# Patient Record
Sex: Female | Born: 1950 | Race: White | Hispanic: No | Marital: Married | Smoking: Former smoker
Health system: Southern US, Community
[De-identification: ages and names within clinical notes are randomized; demographics above are authoritative.]

## PROBLEM LIST (undated history)

## (undated) DIAGNOSIS — R600 Localized edema: Secondary | ICD-10-CM

## (undated) DIAGNOSIS — M1990 Unspecified osteoarthritis, unspecified site: Secondary | ICD-10-CM

## (undated) DIAGNOSIS — M1711 Unilateral primary osteoarthritis, right knee: Secondary | ICD-10-CM

## (undated) DIAGNOSIS — R3915 Urgency of urination: Secondary | ICD-10-CM

## (undated) DIAGNOSIS — R609 Edema, unspecified: Secondary | ICD-10-CM

## (undated) DIAGNOSIS — E785 Hyperlipidemia, unspecified: Secondary | ICD-10-CM

## (undated) DIAGNOSIS — E559 Vitamin D deficiency, unspecified: Secondary | ICD-10-CM

## (undated) DIAGNOSIS — K5790 Diverticulosis of intestine, part unspecified, without perforation or abscess without bleeding: Secondary | ICD-10-CM

## (undated) DIAGNOSIS — E669 Obesity, unspecified: Secondary | ICD-10-CM

## (undated) DIAGNOSIS — F419 Anxiety disorder, unspecified: Secondary | ICD-10-CM

## (undated) DIAGNOSIS — J439 Emphysema, unspecified: Secondary | ICD-10-CM

## (undated) DIAGNOSIS — K219 Gastro-esophageal reflux disease without esophagitis: Secondary | ICD-10-CM

## (undated) DIAGNOSIS — L409 Psoriasis, unspecified: Secondary | ICD-10-CM

## (undated) DIAGNOSIS — F329 Major depressive disorder, single episode, unspecified: Secondary | ICD-10-CM

## (undated) DIAGNOSIS — Z872 Personal history of diseases of the skin and subcutaneous tissue: Secondary | ICD-10-CM

## (undated) DIAGNOSIS — Z8489 Family history of other specified conditions: Secondary | ICD-10-CM

## (undated) DIAGNOSIS — I10 Essential (primary) hypertension: Secondary | ICD-10-CM

## (undated) DIAGNOSIS — F32A Depression, unspecified: Secondary | ICD-10-CM

## (undated) DIAGNOSIS — G479 Sleep disorder, unspecified: Secondary | ICD-10-CM

## (undated) DIAGNOSIS — R011 Cardiac murmur, unspecified: Secondary | ICD-10-CM

## (undated) HISTORY — PX: TONSILLECTOMY AND ADENOIDECTOMY: SUR1326

## (undated) HISTORY — DX: Emphysema, unspecified: J43.9

## (undated) HISTORY — DX: Edema, unspecified: R60.9

## (undated) HISTORY — DX: Gastro-esophageal reflux disease without esophagitis: K21.9

## (undated) HISTORY — DX: Cardiac murmur, unspecified: R01.1

## (undated) HISTORY — PX: TUBAL LIGATION: SHX77

## (undated) HISTORY — DX: Essential (primary) hypertension: I10

## (undated) HISTORY — PX: ABDOMINAL HYSTERECTOMY: SHX81

## (undated) HISTORY — DX: Personal history of diseases of the skin and subcutaneous tissue: Z87.2

## (undated) HISTORY — DX: Localized edema: R60.0

## (undated) HISTORY — PX: APPENDECTOMY: SHX54

## (undated) HISTORY — DX: Obesity, unspecified: E66.9

## (undated) HISTORY — DX: Vitamin D deficiency, unspecified: E55.9

## (undated) HISTORY — DX: Hyperlipidemia, unspecified: E78.5

## (undated) HISTORY — PX: KNEE ARTHROSCOPY: SUR90

## (undated) HISTORY — PX: JOINT REPLACEMENT: SHX530

## (undated) HISTORY — DX: Diverticulosis of intestine, part unspecified, without perforation or abscess without bleeding: K57.90

---
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
## 1997-09-11 ENCOUNTER — Ambulatory Visit (HOSPITAL_COMMUNITY): Admission: RE | Admit: 1997-09-11 | Discharge: 1997-09-11 | Payer: Self-pay | Admitting: *Deleted

## 1998-09-15 ENCOUNTER — Ambulatory Visit (HOSPITAL_COMMUNITY): Admission: RE | Admit: 1998-09-15 | Discharge: 1998-09-15 | Payer: Self-pay | Admitting: *Deleted

## 1999-02-16 ENCOUNTER — Other Ambulatory Visit: Admission: RE | Admit: 1999-02-16 | Discharge: 1999-02-16 | Payer: Self-pay | Admitting: *Deleted

## 1999-09-21 ENCOUNTER — Ambulatory Visit (HOSPITAL_COMMUNITY): Admission: RE | Admit: 1999-09-21 | Discharge: 1999-09-21 | Payer: Self-pay | Admitting: *Deleted

## 2000-02-24 ENCOUNTER — Other Ambulatory Visit: Admission: RE | Admit: 2000-02-24 | Discharge: 2000-02-24 | Payer: Self-pay | Admitting: *Deleted

## 2000-04-06 ENCOUNTER — Ambulatory Visit (HOSPITAL_COMMUNITY): Admission: RE | Admit: 2000-04-06 | Discharge: 2000-04-06 | Payer: Self-pay | Admitting: *Deleted

## 2000-11-09 ENCOUNTER — Ambulatory Visit (HOSPITAL_COMMUNITY): Admission: RE | Admit: 2000-11-09 | Discharge: 2000-11-09 | Payer: Self-pay | Admitting: Internal Medicine

## 2000-11-09 ENCOUNTER — Encounter: Payer: Self-pay | Admitting: Internal Medicine

## 2000-12-08 ENCOUNTER — Ambulatory Visit (HOSPITAL_COMMUNITY): Admission: RE | Admit: 2000-12-08 | Discharge: 2000-12-08 | Payer: Self-pay | Admitting: Internal Medicine

## 2000-12-08 ENCOUNTER — Encounter (INDEPENDENT_AMBULATORY_CARE_PROVIDER_SITE_OTHER): Payer: Self-pay | Admitting: *Deleted

## 2000-12-08 ENCOUNTER — Encounter: Payer: Self-pay | Admitting: Internal Medicine

## 2001-05-09 ENCOUNTER — Encounter: Payer: Self-pay | Admitting: Internal Medicine

## 2001-05-09 ENCOUNTER — Ambulatory Visit (HOSPITAL_COMMUNITY): Admission: RE | Admit: 2001-05-09 | Discharge: 2001-05-09 | Payer: Self-pay | Admitting: Internal Medicine

## 2001-05-09 ENCOUNTER — Encounter (INDEPENDENT_AMBULATORY_CARE_PROVIDER_SITE_OTHER): Payer: Self-pay | Admitting: *Deleted

## 2001-05-23 ENCOUNTER — Encounter: Payer: Self-pay | Admitting: Internal Medicine

## 2001-05-23 ENCOUNTER — Ambulatory Visit (HOSPITAL_COMMUNITY): Admission: RE | Admit: 2001-05-23 | Discharge: 2001-05-23 | Payer: Self-pay | Admitting: Internal Medicine

## 2001-05-23 ENCOUNTER — Encounter (INDEPENDENT_AMBULATORY_CARE_PROVIDER_SITE_OTHER): Payer: Self-pay | Admitting: *Deleted

## 2001-11-13 ENCOUNTER — Encounter: Payer: Self-pay | Admitting: Internal Medicine

## 2001-11-13 ENCOUNTER — Ambulatory Visit (HOSPITAL_COMMUNITY): Admission: RE | Admit: 2001-11-13 | Discharge: 2001-11-13 | Payer: Self-pay | Admitting: Internal Medicine

## 2002-05-01 ENCOUNTER — Other Ambulatory Visit: Admission: RE | Admit: 2002-05-01 | Discharge: 2002-05-01 | Payer: Self-pay | Admitting: Internal Medicine

## 2002-05-01 ENCOUNTER — Encounter: Payer: Self-pay | Admitting: Internal Medicine

## 2002-05-01 ENCOUNTER — Ambulatory Visit (HOSPITAL_COMMUNITY): Admission: RE | Admit: 2002-05-01 | Discharge: 2002-05-01 | Payer: Self-pay | Admitting: Internal Medicine

## 2002-11-15 ENCOUNTER — Encounter: Payer: Self-pay | Admitting: Internal Medicine

## 2002-11-15 ENCOUNTER — Ambulatory Visit (HOSPITAL_COMMUNITY): Admission: RE | Admit: 2002-11-15 | Discharge: 2002-11-15 | Payer: Self-pay | Admitting: Internal Medicine

## 2002-12-10 ENCOUNTER — Ambulatory Visit (HOSPITAL_COMMUNITY): Admission: RE | Admit: 2002-12-10 | Discharge: 2002-12-10 | Payer: Self-pay | Admitting: Gastroenterology

## 2002-12-10 ENCOUNTER — Encounter: Payer: Self-pay | Admitting: Gastroenterology

## 2003-05-28 ENCOUNTER — Encounter: Payer: Self-pay | Admitting: Gastroenterology

## 2003-07-20 HISTORY — PX: CARDIAC CATHETERIZATION: SHX172

## 2003-09-10 ENCOUNTER — Ambulatory Visit (HOSPITAL_COMMUNITY): Admission: RE | Admit: 2003-09-10 | Discharge: 2003-09-10 | Payer: Self-pay | Admitting: Internal Medicine

## 2003-11-17 DIAGNOSIS — K5790 Diverticulosis of intestine, part unspecified, without perforation or abscess without bleeding: Secondary | ICD-10-CM

## 2003-11-17 HISTORY — DX: Diverticulosis of intestine, part unspecified, without perforation or abscess without bleeding: K57.90

## 2003-11-19 ENCOUNTER — Ambulatory Visit (HOSPITAL_COMMUNITY): Admission: RE | Admit: 2003-11-19 | Discharge: 2003-11-19 | Payer: Self-pay | Admitting: Internal Medicine

## 2004-05-06 ENCOUNTER — Other Ambulatory Visit: Admission: RE | Admit: 2004-05-06 | Discharge: 2004-05-06 | Payer: Self-pay | Admitting: Internal Medicine

## 2004-11-19 ENCOUNTER — Ambulatory Visit (HOSPITAL_COMMUNITY): Admission: RE | Admit: 2004-11-19 | Discharge: 2004-11-19 | Payer: Self-pay | Admitting: Internal Medicine

## 2005-05-10 ENCOUNTER — Ambulatory Visit (HOSPITAL_COMMUNITY): Admission: RE | Admit: 2005-05-10 | Discharge: 2005-05-10 | Payer: Self-pay | Admitting: Internal Medicine

## 2005-11-22 ENCOUNTER — Ambulatory Visit (HOSPITAL_COMMUNITY): Admission: RE | Admit: 2005-11-22 | Discharge: 2005-11-22 | Payer: Self-pay | Admitting: Internal Medicine

## 2006-05-10 ENCOUNTER — Ambulatory Visit (HOSPITAL_COMMUNITY): Admission: RE | Admit: 2006-05-10 | Discharge: 2006-05-10 | Payer: Self-pay | Admitting: Internal Medicine

## 2006-09-27 ENCOUNTER — Ambulatory Visit: Payer: Self-pay | Admitting: Gastroenterology

## 2006-11-24 ENCOUNTER — Ambulatory Visit (HOSPITAL_COMMUNITY): Admission: RE | Admit: 2006-11-24 | Discharge: 2006-11-24 | Payer: Self-pay | Admitting: Internal Medicine

## 2006-12-07 ENCOUNTER — Ambulatory Visit: Payer: Self-pay | Admitting: Gastroenterology

## 2006-12-18 LAB — HM COLONOSCOPY

## 2006-12-29 ENCOUNTER — Ambulatory Visit: Payer: Self-pay | Admitting: Gastroenterology

## 2006-12-29 ENCOUNTER — Ambulatory Visit (HOSPITAL_COMMUNITY): Admission: RE | Admit: 2006-12-29 | Discharge: 2006-12-29 | Payer: Self-pay | Admitting: Gastroenterology

## 2007-01-06 ENCOUNTER — Ambulatory Visit: Payer: Self-pay | Admitting: Gastroenterology

## 2007-01-24 ENCOUNTER — Ambulatory Visit: Payer: Self-pay | Admitting: Gastroenterology

## 2007-11-06 DIAGNOSIS — F419 Anxiety disorder, unspecified: Secondary | ICD-10-CM | POA: Insufficient documentation

## 2007-11-06 DIAGNOSIS — F329 Major depressive disorder, single episode, unspecified: Secondary | ICD-10-CM

## 2007-11-06 DIAGNOSIS — M129 Arthropathy, unspecified: Secondary | ICD-10-CM | POA: Insufficient documentation

## 2007-11-06 DIAGNOSIS — F411 Generalized anxiety disorder: Secondary | ICD-10-CM

## 2007-11-06 DIAGNOSIS — F324 Major depressive disorder, single episode, in partial remission: Secondary | ICD-10-CM | POA: Insufficient documentation

## 2007-11-06 DIAGNOSIS — Z872 Personal history of diseases of the skin and subcutaneous tissue: Secondary | ICD-10-CM

## 2007-11-06 DIAGNOSIS — K589 Irritable bowel syndrome without diarrhea: Secondary | ICD-10-CM | POA: Insufficient documentation

## 2007-11-06 HISTORY — DX: Personal history of diseases of the skin and subcutaneous tissue: Z87.2

## 2007-11-28 ENCOUNTER — Ambulatory Visit (HOSPITAL_COMMUNITY): Admission: RE | Admit: 2007-11-28 | Discharge: 2007-11-28 | Payer: Self-pay | Admitting: Internal Medicine

## 2008-04-16 ENCOUNTER — Telehealth: Payer: Self-pay | Admitting: Gastroenterology

## 2008-05-20 ENCOUNTER — Ambulatory Visit: Payer: Self-pay | Admitting: Internal Medicine

## 2008-07-01 ENCOUNTER — Ambulatory Visit: Payer: Self-pay | Admitting: Internal Medicine

## 2008-11-28 ENCOUNTER — Ambulatory Visit (HOSPITAL_COMMUNITY): Admission: RE | Admit: 2008-11-28 | Discharge: 2008-11-28 | Payer: Self-pay | Admitting: Internal Medicine

## 2009-06-08 ENCOUNTER — Emergency Department (HOSPITAL_COMMUNITY): Admission: EM | Admit: 2009-06-08 | Discharge: 2009-06-08 | Payer: Self-pay | Admitting: Emergency Medicine

## 2009-06-19 ENCOUNTER — Other Ambulatory Visit: Admission: RE | Admit: 2009-06-19 | Discharge: 2009-06-19 | Payer: Self-pay | Admitting: Internal Medicine

## 2009-12-02 ENCOUNTER — Ambulatory Visit (HOSPITAL_COMMUNITY): Admission: RE | Admit: 2009-12-02 | Discharge: 2009-12-02 | Payer: Self-pay | Admitting: Internal Medicine

## 2010-04-17 ENCOUNTER — Ambulatory Visit (HOSPITAL_BASED_OUTPATIENT_CLINIC_OR_DEPARTMENT_OTHER): Admission: RE | Admit: 2010-04-17 | Discharge: 2010-04-17 | Payer: Self-pay | Admitting: Specialist

## 2010-05-07 ENCOUNTER — Encounter
Admission: RE | Admit: 2010-05-07 | Discharge: 2010-07-16 | Payer: Self-pay | Source: Home / Self Care | Attending: Specialist | Admitting: Specialist

## 2010-10-01 LAB — POCT I-STAT 4, (NA,K, GLUC, HGB,HCT): Hemoglobin: 13.6 g/dL (ref 12.0–15.0)

## 2010-11-02 ENCOUNTER — Other Ambulatory Visit (HOSPITAL_COMMUNITY): Payer: Self-pay | Admitting: Internal Medicine

## 2010-11-02 DIAGNOSIS — Z1231 Encounter for screening mammogram for malignant neoplasm of breast: Secondary | ICD-10-CM

## 2010-12-01 NOTE — Assessment & Plan Note (Signed)
[facility]                         GASTROENTEROLOGY OFFICE NOTE   Jade Singh, Jade Singh                    MRN:          [id number]  DATE:01/24/2007                            DOB:          04/05/1951    PROBLEM:  Rectal pain, anal fissure.   [name] has returned for reevaluation.  Her anal fissure was injected  with Botox approximately 1 month ago.  She reports complete resolution  of her pain.  She is no longer having rectal discomfort or itching.  She  does complain of very erratic bowels, characterized by alternating  episodes of diarrhea and constipation.  She complains of mild lower  abdominal discomfort.   EXAM:  Pulse 80, blood pressure 148/94. Weight 222.   IMPRESSION:  1. Anal fissure - resolved with medical therapy and Botox.  2. Irritable bowel syndrome.   RECOMMENDATIONS:  Fiber supplementation daily. If this is not successful  she will return for reevaluation.     Barbette Hair. Arlyce Dice, MD,FACG  Electronically Signed    RDK/MedQ  DD: 01/24/2007  DT: 01/25/2007  Job #: [id number]   cc:   Lovenia Kim, D.O.

## 2010-12-01 NOTE — Assessment & Plan Note (Signed)
[facility]                         GASTROENTEROLOGY OFFICE NOTE   EDOM, SCHMUHL                    MRN:          [id number]  DATE:12/07/2006                            DOB:          October 27, 1950    PROBLEM:  Rectal pain.   Ms. Oncale has returned for reevaluation.  She continues to complain with  defecation with burning and occasional minimal rectal bleeding.  She has  been taking AnaMantle suppositories without improvement.  The symptoms  are intermittent.   EXAMINATION:  Pulse 76.  Blood pressure 128/82.  Weight 219.  RECTAL EXAM:  There is a fissure approximately just to the right of the  midline posteriorly at approximately 1 o'clock.   IMPRESSION:  Symptomatic anal fissure.   RECOMMENDATIONS:  1. Begin diltiazem ointment 2% locally twice a day.  2. Sigmoidoscopy.  3. Botox injection of her anal fissure.     Barbette Hair. Arlyce Dice, MD,FACG  Electronically Signed    RDK/MedQ  DD: 12/07/2006  DT: 12/07/2006  Job #: [id number]   cc:   Lovenia Kim, D.O.

## 2010-12-04 NOTE — Assessment & Plan Note (Signed)
[facility]                         GASTROENTEROLOGY OFFICE NOTE   Jade, Singh                    MRN:          [id number]  DATE:09/27/2006                            DOB:          10/04/1950    REFERRING PHYSICIAN:  Lovenia Singh, D.O.   PROBLEM:  Rectal discomfort and burning.   HISTORY:  Jade Singh is a pleasant 60 year old white female who relates  that she has been having an approximate 66-month history of rectal  discomfort internally with burning, itching and pain. She says she took  several courses of antibiotics in the fall and then developed a yeast  infection.  She was given Diflucan which cleared up her vaginal symptoms  but she has persisted with rectal symptoms.  She says she has burning  with bowel movements and has small amounts of bright red blood on the  tissue.  She has tried Preparation H cream, wipes, etc., has not tried  any suppositories.   She also relates some episodes with constipation which lasts for  approximately one day, which is then followed by abdominal bloating and  then abdominal cramping and diarrhea.  She says she is getting an  episode about once a month over the past year.  She says the diarrhea  may last for 24 hours, resolves and then she is fine in between.  She  thinks that some of these episodes have precipitated her rectal  symptoms.  She says she has been told in the past that this was related  to a gastroenteritis but it seems to be a recurrent problem.   Patient is status post colonoscopy per Dr. Arlyce Dice in November 2004,  which was a normal exam.   CURRENT MEDICATIONS:  1. AcipHex 20 p.o. daily.  2. Ambien 10 nightly.  3. Zocor 40 mg every other day.  4. Premarin 0.3 every other day.  5. Flax seed b.i.d.  6. Calcium b.i.d.  7. Aspirin 81 mg daily.   ALLERGIES AND INTOLERANCES:  RELAFEN.   PAST MEDICAL HISTORY:  1. Previous hysterectomy.  2. Bilateral tubal ligation.  3.  Appendectomy.  4. Depression.  5. Hyperlipidemia.  6. Arthritis.  7. Anxiety.   FAMILY HISTORY:  Pertinent for heart disease in her grandfather,  diabetes in uncles.  There is no family history of colon cancer or  polyps.   SOCIAL HISTORY:  Patient is married.  She is a Retail buyer.  She  drinks alcohol socially.  She is a nonsmoker.   REVIEW OF SYSTEMS:  Pertinent for difficulty sleeping, intermittent  headaches, anxiety, arthritic symptoms, fatigue and low back pain in  addition to GI as outlined above.   PHYSICAL EXAMINATION:  GENERAL APPEARANCE:  A well-developed white  female in no acute distress.  VITAL SIGNS:  Height is 5 feet 5 inches, weight 218, blood pressure  130/84, pulses 88.  HEENT:  Atraumatic, normocephalic.  EOMI, PERRLA.  Sclerae are  anicteric.  NECK:  Supple without nodes.  PULMONARY:  Clear to auscultation and percussion.  CARDIOVASCULAR:  Regular rate and rhythm with S1 and S2.  No murmurs,  rubs, or gallops.  ABDOMEN:  Soft, bowel sounds active.  She is nontender.  There is no  palpable mass or hepatosplenomegaly.  RECTAL:  No external lesion.  No fissure noted.  She is tender to  internal exam but no lesion felt, no thrombosed hemorrhoids.  Stool is  brown and Hemoccult positive.   IMPRESSION:  68. A 60 year old white female with four-month history of rectal      discomfort with internal burning and itching and intermittent small      volume hematochezia.  Suspect her symptoms are due to internal      hemorrhoids, cannot rule out a proctitis or shallow fissure.  2. Episodic abdominal bloating followed by cramping and diarrhea.      Suspect this is related to irritable bowel syndrome.  3. Gastroesophageal reflux disease.  Stable on AcipHex.   PLAN:  1. Trial of AnaMantle suppositories b.i.d.  She is to call back in a      week if she is not any better and at that point, will set her up      for a sigmoidoscopy with sedation and possible  banding.  If      she is improved, we will see her back in 4-6 weeks for follow-up.  2. Trial of Levsin sublingual for p.r.n. use for her abdominal      bloating and diarrhea episodes.     Mike Gip, PA-C  Electronically Signed      Barbette Hair. Arlyce Dice, MD,FACG  Electronically Signed   AE/MedQ  DD: 09/27/2006  DT: 09/29/2006  Job #: [id number]

## 2010-12-08 ENCOUNTER — Ambulatory Visit (HOSPITAL_COMMUNITY)
Admission: RE | Admit: 2010-12-08 | Discharge: 2010-12-08 | Disposition: A | Discharge status: Home / Self Care | Payer: BC Managed Care – PPO | Source: Ambulatory Visit | Attending: Internal Medicine | Admitting: Internal Medicine

## 2010-12-08 DIAGNOSIS — Z1231 Encounter for screening mammogram for malignant neoplasm of breast: Secondary | ICD-10-CM | POA: Insufficient documentation

## 2011-09-01 ENCOUNTER — Other Ambulatory Visit (HOSPITAL_COMMUNITY): Payer: Self-pay | Admitting: Internal Medicine

## 2011-09-01 DIAGNOSIS — Z1231 Encounter for screening mammogram for malignant neoplasm of breast: Secondary | ICD-10-CM

## 2011-09-01 DIAGNOSIS — Z139 Encounter for screening, unspecified: Secondary | ICD-10-CM

## 2011-09-01 DIAGNOSIS — Z0000 Encounter for general adult medical examination without abnormal findings: Secondary | ICD-10-CM

## 2011-11-29 ENCOUNTER — Other Ambulatory Visit: Payer: Self-pay | Admitting: Internal Medicine

## 2011-11-29 DIAGNOSIS — R600 Localized edema: Secondary | ICD-10-CM

## 2011-11-30 ENCOUNTER — Other Ambulatory Visit (HOSPITAL_COMMUNITY): Payer: Self-pay | Admitting: Internal Medicine

## 2011-11-30 ENCOUNTER — Ambulatory Visit
Admission: RE | Admit: 2011-11-30 | Discharge: 2011-11-30 | Disposition: A | Discharge status: Home / Self Care | Payer: BC Managed Care – PPO | Source: Ambulatory Visit | Attending: Internal Medicine | Admitting: Internal Medicine

## 2011-11-30 ENCOUNTER — Ambulatory Visit (HOSPITAL_COMMUNITY)
Admission: RE | Admit: 2011-11-30 | Discharge: 2011-11-30 | Disposition: A | Discharge status: Home / Self Care | Payer: BC Managed Care – PPO | Source: Ambulatory Visit | Attending: Internal Medicine | Admitting: Internal Medicine

## 2011-11-30 DIAGNOSIS — R0602 Shortness of breath: Secondary | ICD-10-CM

## 2011-11-30 DIAGNOSIS — Z87891 Personal history of nicotine dependence: Secondary | ICD-10-CM | POA: Insufficient documentation

## 2011-11-30 DIAGNOSIS — R079 Chest pain, unspecified: Secondary | ICD-10-CM | POA: Insufficient documentation

## 2011-11-30 DIAGNOSIS — R600 Localized edema: Secondary | ICD-10-CM

## 2011-12-09 ENCOUNTER — Ambulatory Visit (HOSPITAL_COMMUNITY)
Admission: RE | Admit: 2011-12-09 | Discharge: 2011-12-09 | Disposition: A | Discharge status: Home / Self Care | Payer: BC Managed Care – PPO | Source: Ambulatory Visit | Attending: Internal Medicine | Admitting: Internal Medicine

## 2011-12-09 ENCOUNTER — Ambulatory Visit (HOSPITAL_COMMUNITY): Payer: BC Managed Care – PPO

## 2011-12-09 DIAGNOSIS — Z139 Encounter for screening, unspecified: Secondary | ICD-10-CM

## 2011-12-09 DIAGNOSIS — Z1231 Encounter for screening mammogram for malignant neoplasm of breast: Secondary | ICD-10-CM | POA: Insufficient documentation

## 2011-12-09 LAB — HM DEXA SCAN

## 2011-12-27 ENCOUNTER — Other Ambulatory Visit: Payer: Self-pay | Admitting: Cardiovascular Disease

## 2011-12-27 DIAGNOSIS — R079 Chest pain, unspecified: Secondary | ICD-10-CM

## 2011-12-27 DIAGNOSIS — R0602 Shortness of breath: Secondary | ICD-10-CM

## 2011-12-28 ENCOUNTER — Ambulatory Visit
Admission: RE | Admit: 2011-12-28 | Discharge: 2011-12-28 | Disposition: A | Discharge status: Home / Self Care | Payer: BC Managed Care – PPO | Source: Ambulatory Visit | Attending: Cardiovascular Disease | Admitting: Cardiovascular Disease

## 2011-12-28 DIAGNOSIS — R079 Chest pain, unspecified: Secondary | ICD-10-CM

## 2011-12-28 DIAGNOSIS — R0602 Shortness of breath: Secondary | ICD-10-CM

## 2011-12-28 MED ORDER — IOHEXOL 350 MG/ML SOLN
125.0000 mL | Freq: Once | INTRAVENOUS | Status: AC | PRN
  Administered 2011-12-28: 125 mL via INTRAVENOUS

## 2012-01-17 LAB — HM DIABETES EYE EXAM

## 2012-07-14 ENCOUNTER — Other Ambulatory Visit (HOSPITAL_COMMUNITY): Payer: Self-pay | Admitting: Cardiovascular Disease

## 2012-07-14 DIAGNOSIS — I10 Essential (primary) hypertension: Secondary | ICD-10-CM

## 2012-07-17 ENCOUNTER — Ambulatory Visit (HOSPITAL_COMMUNITY)
Admission: RE | Admit: 2012-07-17 | Discharge: 2012-07-17 | Disposition: A | Discharge status: Home / Self Care | Payer: BC Managed Care – PPO | Source: Ambulatory Visit | Attending: Cardiovascular Disease | Admitting: Cardiovascular Disease

## 2012-07-17 DIAGNOSIS — I10 Essential (primary) hypertension: Secondary | ICD-10-CM | POA: Insufficient documentation

## 2012-07-17 DIAGNOSIS — I059 Rheumatic mitral valve disease, unspecified: Secondary | ICD-10-CM | POA: Insufficient documentation

## 2012-07-17 DIAGNOSIS — I079 Rheumatic tricuspid valve disease, unspecified: Secondary | ICD-10-CM | POA: Insufficient documentation

## 2012-07-17 DIAGNOSIS — I379 Nonrheumatic pulmonary valve disorder, unspecified: Secondary | ICD-10-CM | POA: Insufficient documentation

## 2012-07-17 DIAGNOSIS — R072 Precordial pain: Secondary | ICD-10-CM | POA: Insufficient documentation

## 2012-07-17 NOTE — Progress Notes (Signed)
2D Echo Performed 07/17/2012    Daniel Ritthaler, RCS  

## 2012-08-29 ENCOUNTER — Other Ambulatory Visit (HOSPITAL_COMMUNITY)
Admission: RE | Admit: 2012-08-29 | Discharge: 2012-08-29 | Disposition: A | Discharge status: Home / Self Care | Payer: BC Managed Care – PPO | Source: Ambulatory Visit | Attending: Internal Medicine | Admitting: Internal Medicine

## 2012-08-29 DIAGNOSIS — Z01419 Encounter for gynecological examination (general) (routine) without abnormal findings: Secondary | ICD-10-CM | POA: Insufficient documentation

## 2012-11-08 ENCOUNTER — Other Ambulatory Visit (HOSPITAL_COMMUNITY): Payer: Self-pay | Admitting: Emergency Medicine

## 2012-11-08 DIAGNOSIS — Z1231 Encounter for screening mammogram for malignant neoplasm of breast: Secondary | ICD-10-CM

## 2012-12-18 ENCOUNTER — Ambulatory Visit (HOSPITAL_COMMUNITY)
Admission: RE | Admit: 2012-12-18 | Discharge: 2012-12-18 | Disposition: A | Discharge status: Home / Self Care | Payer: BC Managed Care – PPO | Source: Ambulatory Visit | Attending: Emergency Medicine | Admitting: Emergency Medicine

## 2012-12-18 DIAGNOSIS — Z1231 Encounter for screening mammogram for malignant neoplasm of breast: Secondary | ICD-10-CM | POA: Insufficient documentation

## 2013-01-31 ENCOUNTER — Other Ambulatory Visit: Payer: Self-pay | Admitting: Cardiovascular Disease

## 2013-01-31 LAB — CBC WITH DIFFERENTIAL/PLATELET
Eosinophils Absolute: 0.2 10*3/uL (ref 0.0–0.7)
Eosinophils Relative: 3 % (ref 0–5)
HCT: 41.1 % (ref 36.0–46.0)
Hemoglobin: 13.6 g/dL (ref 12.0–15.0)
Lymphocytes Relative: 35 % (ref 12–46)
MCH: 28.5 pg (ref 26.0–34.0)
MCHC: 33.1 g/dL (ref 30.0–36.0)
MCV: 86 fL (ref 78.0–100.0)
Monocytes Absolute: 0.6 10*3/uL (ref 0.1–1.0)
Neutro Abs: 4.7 10*3/uL (ref 1.7–7.7)
Neutrophils Relative %: 54 % (ref 43–77)
Platelets: 246 10*3/uL (ref 150–400)
RDW: 14 % (ref 11.5–15.5)

## 2013-02-01 LAB — TSH: TSH: 2.217 u[IU]/mL (ref 0.350–4.500)

## 2013-02-01 LAB — COMPREHENSIVE METABOLIC PANEL
ALT: 16 U/L (ref 0–35)
AST: 13 U/L (ref 0–37)
Chloride: 103 mEq/L (ref 96–112)
Creat: 0.82 mg/dL (ref 0.50–1.10)
Glucose, Bld: 87 mg/dL (ref 70–99)
Potassium: 4.1 mEq/L (ref 3.5–5.3)
Total Protein: 6.2 g/dL (ref 6.0–8.3)

## 2013-02-01 LAB — T4, FREE: Free T4: 1.31 ng/dL (ref 0.80–1.80)

## 2013-02-01 LAB — LIPID PANEL
Cholesterol: 138 mg/dL (ref 0–200)
HDL: 49 mg/dL
LDL Cholesterol: 75 mg/dL (ref 0–99)
Total CHOL/HDL Ratio: 2.8 ratio
Triglycerides: 69 mg/dL
VLDL: 14 mg/dL (ref 0–40)

## 2013-02-09 ENCOUNTER — Encounter: Payer: Self-pay | Admitting: Cardiovascular Disease

## 2013-03-29 ENCOUNTER — Encounter: Payer: Self-pay | Admitting: Gastroenterology

## 2013-04-06 ENCOUNTER — Telehealth: Payer: Self-pay | Admitting: Cardiovascular Disease

## 2013-04-06 MED ORDER — PANTOPRAZOLE SODIUM 40 MG PO TBEC: 40.0000 mg | DELAYED_RELEASE_TABLET | Freq: Every day | ORAL | Status: DC

## 2013-04-06 NOTE — Telephone Encounter (Signed)
Please call concerning her prescription.

## 2013-04-06 NOTE — Telephone Encounter (Signed)
Returned call.  Pt stated she needs a refill on pantoprazole for her stomach.  [facility] said the faxed the office and haven't heard anything back.  Informed no request received, but RN will send in refill now.  Pt verbalized understanding and agreed w/ plan.  Refill(s) sent to pharmacy via [facility].

## 2013-05-28 ENCOUNTER — Other Ambulatory Visit: Payer: Self-pay | Admitting: Emergency Medicine

## 2013-05-28 DIAGNOSIS — G4700 Insomnia, unspecified: Secondary | ICD-10-CM

## 2013-06-06 ENCOUNTER — Encounter: Payer: Self-pay | Admitting: Internal Medicine

## 2013-06-06 DIAGNOSIS — E7800 Pure hypercholesterolemia, unspecified: Secondary | ICD-10-CM | POA: Insufficient documentation

## 2013-06-06 DIAGNOSIS — E785 Hyperlipidemia, unspecified: Secondary | ICD-10-CM

## 2013-06-06 DIAGNOSIS — I10 Essential (primary) hypertension: Secondary | ICD-10-CM | POA: Insufficient documentation

## 2013-06-06 DIAGNOSIS — D649 Anemia, unspecified: Secondary | ICD-10-CM

## 2013-06-06 DIAGNOSIS — E559 Vitamin D deficiency, unspecified: Secondary | ICD-10-CM | POA: Insufficient documentation

## 2013-06-06 DIAGNOSIS — K219 Gastro-esophageal reflux disease without esophagitis: Secondary | ICD-10-CM | POA: Insufficient documentation

## 2013-06-06 DIAGNOSIS — E669 Obesity, unspecified: Secondary | ICD-10-CM

## 2013-06-07 ENCOUNTER — Ambulatory Visit: Payer: BC Managed Care – PPO | Admitting: Emergency Medicine

## 2013-06-07 ENCOUNTER — Encounter: Payer: Self-pay | Admitting: Emergency Medicine

## 2013-06-07 VITALS — BP 138/80 | HR 62 | Temp 98.0°F | Resp 18 | Wt 212.0 lb

## 2013-06-07 DIAGNOSIS — R7309 Other abnormal glucose: Secondary | ICD-10-CM

## 2013-06-07 DIAGNOSIS — E782 Mixed hyperlipidemia: Secondary | ICD-10-CM

## 2013-06-07 DIAGNOSIS — I10 Essential (primary) hypertension: Secondary | ICD-10-CM

## 2013-06-07 DIAGNOSIS — Z23 Encounter for immunization: Secondary | ICD-10-CM

## 2013-06-07 DIAGNOSIS — E559 Vitamin D deficiency, unspecified: Secondary | ICD-10-CM

## 2013-06-07 DIAGNOSIS — R05 Cough: Secondary | ICD-10-CM

## 2013-06-07 LAB — BASIC METABOLIC PANEL WITH GFR
Calcium: 9.3 mg/dL (ref 8.4–10.5)
GFR, Est African American: >89 mL/min
GFR, Est Non African American: 82 mL/min
Glucose, Bld: 88 mg/dL (ref 70–99)
Potassium: 4 mEq/L (ref 3.5–5.3)
Sodium: 140 mEq/L (ref 135–145)

## 2013-06-07 LAB — LIPID PANEL
Cholesterol: 141 mg/dL (ref 0–200)
HDL: 50 mg/dL (ref >39)
LDL Cholesterol: 74 mg/dL (ref 0–99)
Total CHOL/HDL Ratio: 2.8 Ratio
Triglycerides: 87 mg/dL (ref <150)
VLDL: 17 mg/dL (ref 0–40)

## 2013-06-07 LAB — CBC WITH DIFFERENTIAL/PLATELET
Eosinophils Absolute: 0.3 10*3/uL (ref 0.0–0.7)
Eosinophils Relative: 3 % (ref 0–5)
HCT: 40.5 % (ref 36.0–46.0)
Lymphocytes Relative: 36 % (ref 12–46)
Lymphs Abs: 3.3 10*3/uL (ref 0.7–4.0)
MCH: 29 pg (ref 26.0–34.0)
MCHC: 33.3 g/dL (ref 30.0–36.0)
MCV: 86.9 fL (ref 78.0–100.0)
Monocytes Absolute: 0.6 10*3/uL (ref 0.1–1.0)
Neutrophils Relative %: 53 % (ref 43–77)
Platelets: 250 10*3/uL (ref 150–400)
RBC: 4.66 MIL/uL (ref 3.87–5.11)
WBC: 9.1 10*3/uL (ref 4.0–10.5)

## 2013-06-07 LAB — HEPATIC FUNCTION PANEL
ALT: 19 U/L (ref 0–35)
AST: 13 U/L (ref 0–37)
Alkaline Phosphatase: 51 U/L (ref 39–117)
Bilirubin, Direct: 0.1 mg/dL (ref 0.0–0.3)
Indirect Bilirubin: 0.3 mg/dL (ref 0.0–0.9)
Total Protein: 6 g/dL (ref 6.0–8.3)

## 2013-06-07 LAB — HEMOGLOBIN A1C: Mean Plasma Glucose: 114 mg/dL (ref <117)

## 2013-06-07 LAB — MAGNESIUM: Magnesium: 1.9 mg/dL (ref 1.5–2.5)

## 2013-06-07 MED ORDER — HYDROCOD POLST-CHLORPHEN POLST 10-8 MG/5ML PO LQCR: 5.0000 mL | Freq: Two times a day (BID) | ORAL | Status: DC

## 2013-06-07 NOTE — Patient Instructions (Signed)

## 2013-06-08 LAB — VITAMIN D 25 HYDROXY (VIT D DEFICIENCY, FRACTURES): Vit D, 25-Hydroxy: 98 ng/mL — ABNORMAL HIGH (ref 30–89)

## 2013-06-08 NOTE — Progress Notes (Signed)
  Subjective:    Patient ID: Jade Singh, female    DOB: 16-Oct-1950, 62 y.o.   MRN: [id number]  HPI Comments: 62 yo female presents for 3 month F/U for HTN, Cholesterol, Pre-Dm, D. Deficient. She is feeling better with 27# weight loss. She is continuing to eat better with wt watchers. She has started exercising more. She completed pelvic floor rehab with + improvement with symptoms. She notes this time of year gets allergies, congestion and cough and would like refill on Tussionex. No symptoms currently.   Hypertension  Hyperlipidemia  Anemia  Gastrophageal Reflux    Current Outpatient Prescriptions on File Prior to Visit  Medication Sig Dispense Refill  . acitretin (SORIATANE) 25 MG capsule Take 25 mg by mouth daily before breakfast.      . aspirin 325 MG tablet Take 325 mg by mouth daily.      . Cholecalciferol (D 5000) 5000 UNITS capsule Take 5,000 Units by mouth daily.      . naproxen (NAPROSYN) 250 MG tablet Take 250 mg by mouth as needed.      . pantoprazole (PROTONIX) 40 MG tablet Take 1 tablet (40 mg total) by mouth daily.  90 tablet  2  . pravastatin (PRAVACHOL) 40 MG tablet Take 40 mg by mouth daily.      Marland Kitchen triamterene-hydrochlorothiazide (MAXZIDE-25) 37.5-25 MG per tablet Take 1 tablet by mouth daily.      Marland Kitchen zolpidem (AMBIEN) 5 MG tablet TAKE ONE TABLET BY MOUTH AT BEDTIME  30 tablet  0   No current facility-administered medications on file prior to visit.   ALLERGIES Levaquin and Oruvail  Past Medical History  Diagnosis Date  . Hypertension   . Hyperlipidemia   . Anemia   . GERD (gastroesophageal reflux disease)   . Unspecified vitamin D deficiency   . Obese      Review of Systems  All other systems reviewed and are negative.    BP 138/80  Pulse 62  Temp(Src) 98 F (36.7 C) (Temporal)  Resp 18  Wt 212 lb (96.163 kg)     Objective:   Physical Exam  Nursing note and vitals reviewed. Constitutional: She is oriented to person, place, and time.  She appears well-developed and well-nourished.  HENT:  Head: Normocephalic and atraumatic.  Right Ear: External ear normal.  Left Ear: External ear normal.  Nose: Nose normal.  Mouth/Throat: Oropharynx is clear and moist.  Eyes: Pupils are equal, round, and reactive to light.  Neck: Normal range of motion. Neck supple. No thyromegaly present.  Cardiovascular: Normal rate, regular rhythm, normal heart sounds and intact distal pulses.   Pulmonary/Chest: Effort normal and breath sounds normal.  Abdominal: Soft. Bowel sounds are normal. She exhibits no distension. There is no tenderness.  Musculoskeletal: Normal range of motion.  Lymphadenopathy:    She has no cervical adenopathy.  Neurological: She is alert and oriented to person, place, and time.  Skin: Skin is warm and dry.  Psychiatric: She has a normal mood and affect. Judgment normal.          Assessment & Plan:  1.  3 month F/U for HTN, Cholesterol, Pre-Dm, D. Deficient check labns, continue wt loss, better diet and increased cardio 2. Allergies/ cough refill Tusionex AD and restart Allegra AD

## 2013-06-27 ENCOUNTER — Other Ambulatory Visit: Payer: Self-pay | Admitting: Emergency Medicine

## 2013-06-27 DIAGNOSIS — G4700 Insomnia, unspecified: Secondary | ICD-10-CM

## 2013-06-27 MED ORDER — ZOLPIDEM TARTRATE 5 MG PO TABS: 5.0000 mg | ORAL_TABLET | Freq: Every evening | ORAL | Status: DC | PRN

## 2013-06-28 ENCOUNTER — Other Ambulatory Visit: Payer: Self-pay | Admitting: Emergency Medicine

## 2013-08-01 ENCOUNTER — Other Ambulatory Visit: Payer: Self-pay | Admitting: Emergency Medicine

## 2013-08-01 DIAGNOSIS — G4700 Insomnia, unspecified: Secondary | ICD-10-CM

## 2013-08-01 MED ORDER — ZOLPIDEM TARTRATE 5 MG PO TABS: 5.0000 mg | ORAL_TABLET | Freq: Every evening | ORAL | Status: DC | PRN

## 2013-08-29 ENCOUNTER — Ambulatory Visit (INDEPENDENT_AMBULATORY_CARE_PROVIDER_SITE_OTHER): Payer: BC Managed Care – PPO | Admitting: Emergency Medicine

## 2013-08-29 ENCOUNTER — Encounter (INDEPENDENT_AMBULATORY_CARE_PROVIDER_SITE_OTHER): Payer: Self-pay

## 2013-08-29 ENCOUNTER — Encounter: Payer: Self-pay | Admitting: Emergency Medicine

## 2013-08-29 VITALS — BP 138/80 | HR 62 | Temp 97.8°F | Resp 18 | Ht 65.0 in | Wt 202.0 lb

## 2013-08-29 DIAGNOSIS — I10 Essential (primary) hypertension: Secondary | ICD-10-CM

## 2013-08-29 DIAGNOSIS — Z79899 Other long term (current) drug therapy: Secondary | ICD-10-CM

## 2013-08-29 DIAGNOSIS — Z111 Encounter for screening for respiratory tuberculosis: Secondary | ICD-10-CM

## 2013-08-29 DIAGNOSIS — E559 Vitamin D deficiency, unspecified: Secondary | ICD-10-CM

## 2013-08-29 DIAGNOSIS — Z0000 Encounter for general adult medical examination without abnormal findings: Secondary | ICD-10-CM

## 2013-08-29 DIAGNOSIS — E782 Mixed hyperlipidemia: Secondary | ICD-10-CM

## 2013-08-29 DIAGNOSIS — R7309 Other abnormal glucose: Secondary | ICD-10-CM

## 2013-08-29 LAB — TSH: TSH: 1.755 u[IU]/mL (ref 0.350–4.500)

## 2013-08-29 LAB — CBC WITH DIFFERENTIAL/PLATELET
Basophils Absolute: 0.1 10*3/uL (ref 0.0–0.1)
Basophils Relative: 1 % (ref 0–1)
Eosinophils Absolute: 0.2 10*3/uL (ref 0.0–0.7)
Eosinophils Relative: 2 % (ref 0–5)
HCT: 42.8 % (ref 36.0–46.0)
Hemoglobin: 14 g/dL (ref 12.0–15.0)
LYMPHS ABS: 2.9 10*3/uL (ref 0.7–4.0)
Lymphocytes Relative: 29 % (ref 12–46)
MCH: 28.7 pg (ref 26.0–34.0)
MCHC: 32.7 g/dL (ref 30.0–36.0)
MCV: 87.7 fL (ref 78.0–100.0)
Monocytes Absolute: 0.5 10*3/uL (ref 0.1–1.0)
Monocytes Relative: 5 % (ref 3–12)
NEUTROS ABS: 6.4 10*3/uL (ref 1.7–7.7)
Neutrophils Relative %: 63 % (ref 43–77)
PLATELETS: 255 10*3/uL (ref 150–400)
RBC: 4.88 MIL/uL (ref 3.87–5.11)
RDW: 13.4 % (ref 11.5–15.5)
WBC: 10 10*3/uL (ref 4.0–10.5)

## 2013-08-29 LAB — LIPID PANEL
Cholesterol: 149 mg/dL (ref 0–200)
HDL: 50 mg/dL (ref >39)
LDL Cholesterol: 84 mg/dL (ref 0–99)
TRIGLYCERIDES: 74 mg/dL (ref <150)
Total CHOL/HDL Ratio: 3 Ratio
VLDL: 15 mg/dL (ref 0–40)

## 2013-08-29 LAB — HEMOGLOBIN A1C
Hgb A1c MFr Bld: 5.5 % (ref <5.7)
MEAN PLASMA GLUCOSE: 111 mg/dL (ref <117)

## 2013-08-29 LAB — HEPATIC FUNCTION PANEL
ALBUMIN: 4 g/dL (ref 3.5–5.2)
ALT: 16 U/L (ref 0–35)
AST: 15 U/L (ref 0–37)
Alkaline Phosphatase: 49 U/L (ref 39–117)
BILIRUBIN DIRECT: 0.1 mg/dL (ref 0.0–0.3)
Indirect Bilirubin: 0.3 mg/dL (ref 0.2–1.2)
Total Bilirubin: 0.4 mg/dL (ref 0.2–1.2)
Total Protein: 6.2 g/dL (ref 6.0–8.3)

## 2013-08-29 LAB — MAGNESIUM: MAGNESIUM: 2 mg/dL (ref 1.5–2.5)

## 2013-08-29 LAB — BASIC METABOLIC PANEL WITH GFR
BUN: 24 mg/dL — AB (ref 6–23)
CHLORIDE: 105 meq/L (ref 96–112)
CO2: 28 mEq/L (ref 19–32)
CREATININE: 0.9 mg/dL (ref 0.50–1.10)
Calcium: 9.2 mg/dL (ref 8.4–10.5)
GFR, EST NON AFRICAN AMERICAN: 69 mL/min
GFR, Est African American: 80 mL/min
Glucose, Bld: 89 mg/dL (ref 70–99)
POTASSIUM: 4.5 meq/L (ref 3.5–5.3)
Sodium: 141 mEq/L (ref 135–145)

## 2013-08-29 LAB — VITAMIN B12: VITAMIN B 12: 513 pg/mL (ref 211–911)

## 2013-08-29 MED ORDER — DIAZEPAM 10 MG PO TABS: 10.0000 mg | ORAL_TABLET | Freq: Four times a day (QID) | ORAL | Status: DC | PRN

## 2013-08-29 NOTE — Progress Notes (Signed)
Subjective:    Patient ID: Jade Singh, female    DOB: Apr 18, 1951, 63 y.o.   MRN: [id number]  HPI Comments: 63 YO overweight female CPE and presents for 3 month F/U for HTN, Cholesterol, Pre-Dm, D. Deficient. She feels well overall with out concerns. She notes BP 130s/ 80s. She has lost more weight, she is down 37 #.She is walking QD and eating healthier. She is on weight watchers.LAST LABS T 138 TG 69 H 49 L 75 A1C 5.6  She is being followed by derm for her psoriasis and notes most plaques on both feet. She has been managing the psoriasis well otherwise. She has noticed mild discoloration of her big toe without any injury.    She notes GERD is controlled with weight loss, healthier diet and Protonix.   She has been receiving cartilage injections for her chronic knee arthritis with Dr Shelle Iron. She notes some improvement and has f/u pending.  She is overdue for her colonoscopy with Dr Juanda Chance but denies any GI concerns/ changes. SHE IS UP TO DATE ON REMAINING SCREENING EXAMS.     Hyperlipidemia  Hypertension  Gastrophageal Reflux   Current Outpatient Prescriptions on File Prior to Visit  Medication Sig Dispense Refill  . acitretin (SORIATANE) 25 MG capsule Take 25 mg by mouth daily before breakfast.      . aspirin 325 MG tablet Take 325 mg by mouth daily.      . chlorpheniramine-HYDROcodone (TUSSIONEX PENNKINETIC ER) 10-8 MG/5ML LQCR Take 5 mLs by mouth 2 (two) times daily.  480 mL  0  . Cholecalciferol (D 5000) 5000 UNITS capsule Take 5,000 Units by mouth daily.      . naproxen (NAPROSYN) 250 MG tablet Take 250 mg by mouth as needed.      . pantoprazole (PROTONIX) 40 MG tablet Take 1 tablet (40 mg total) by mouth daily.  90 tablet  2  . pravastatin (PRAVACHOL) 40 MG tablet Take 40 mg by mouth daily.      Marland Kitchen triamterene-hydrochlorothiazide (MAXZIDE-25) 37.5-25 MG per tablet Take 1 tablet by mouth daily.      Marland Kitchen zolpidem (AMBIEN) 5 MG tablet Take 1 tablet (5 mg total) by mouth at  bedtime as needed for sleep.  30 tablet  1   No current facility-administered medications on file prior to visit.   Allergies  Allergen Reactions  . Levaquin [Levofloxacin In D5w]     yeast  . Oruvail [Ketoprofen]     GI upset   Past Medical History  Diagnosis Date  . Hypertension   . Hyperlipidemia   . Anemia   . GERD (gastroesophageal reflux disease)   . Unspecified vitamin D deficiency   . Obese    Past Surgical History  Procedure Laterality Date  . Knee arthroscopy      RT 2011, LT 2009  . Appendectomy    . Tubal ligation    . Abdominal hysterectomy      partial  . Tonsillectomy and adenoidectomy    . Coronary angioplasty     History  Substance Use Topics  . Smoking status: Former Smoker    Quit date: 07/19/2000  . Smokeless tobacco: Not on file  . Alcohol Use: Yes     Comment: occ   Family History  Problem Relation Age of Onset  . Stroke Mother   . Hyperlipidemia Mother   . Cancer Brother 37    prostate, deceased  . Diabetes Paternal Uncle   . Heart disease Paternal Grandfather  Review of Systems  Eyes:       [facility] 01/2013 WNL  Respiratory:       CXR 11/2011 WNL  Cardiovascular:       DR WEINTRAUB/ DR BERRY PRN 06/2012 2D ECHO EF 65%  Gastrointestinal:       DR Juanda ChanceBRODIE COLON OVERDUE 2014  Genitourinary:       DR Vernie AmmonsTTELIN PRN LAST PAP 08/29/12 WNL  Musculoskeletal: Positive for arthralgias.       DR Shelle IronBEANE 03/2013 CARTILAGE INJECTIONS KNEES BMD 12/09/11 WNL   Skin: Positive for color change.       DR Lovenia KimSTEINHELFER PSORIASIS  All other systems reviewed and are negative.   BP 138/80  Pulse 62  Temp(Src) 97.8 F (36.6 C) (Temporal)  Resp 18  Ht 5\' 5"  (1.651 m)  Wt 202 lb (91.627 kg)  BMI 33.61 kg/m2      Objective:   Physical Exam  Nursing note and vitals reviewed. Constitutional: She is oriented to person, place, and time. She appears well-developed and well-nourished. No distress.  Overweight  HENT:  Head: Normocephalic and  atraumatic.  Right Ear: External ear normal.  Left Ear: External ear normal.  Nose: Nose normal.  Mouth/Throat: No oropharyngeal exudate.  Eyes: Conjunctivae and EOM are normal. Pupils are equal, round, and reactive to light. Right eye exhibits no discharge. Left eye exhibits no discharge. No scleral icterus.  Neck: Normal range of motion. Neck supple. No JVD present. No tracheal deviation present. No thyromegaly present.  Cardiovascular: Normal rate, regular rhythm, normal heart sounds and intact distal pulses.   Pulmonary/Chest: Effort normal and breath sounds normal.  Abdominal: Soft. Bowel sounds are normal. She exhibits no distension and no mass. There is no tenderness. There is no rebound and no guarding.  Genitourinary:  Breasts WNL Pap def 2016  Musculoskeletal: Normal range of motion. She exhibits no edema and no tenderness.  Lymphadenopathy:    She has no cervical adenopathy.  Neurological: She is alert and oriented to person, place, and time. She has normal reflexes. No cranial nerve deficit. She exhibits normal muscle tone. Coordination normal.  Skin: Skin is warm and dry. No rash noted. No erythema. No pallor.  Left great toe nail thick yellow Psoriasis plaques around edges of both feet  Psychiatric: She has a normal mood and affect. Her behavior is normal. Judgment and thought content normal.      AORTA SCAN WNL EKG NSCSPT WNL    Assessment & Plan:  1. CPE- Update screening labs/ History/ Immunizations/ Testing as needed. Advised healthy diet, QD exercise, increase H20 and continue RX/ Vitamins AD. 2.  3 month F/U for Overweight, HTN, Cholesterol, Pre-Dm, D. Deficient. Needs healthy diet, cardio QD and obtain healthy weight. Check Labs, Check BP if >130/80 call office 3. Psoriasis- continue DERM AD 4. Nail fungus- Epsom salt soaks, DRY , Super glue coating QOD, f/u with results 4 weeks

## 2013-08-29 NOTE — Patient Instructions (Signed)

## 2013-08-30 ENCOUNTER — Encounter: Payer: Self-pay | Admitting: Emergency Medicine

## 2013-08-30 LAB — URINALYSIS, ROUTINE W REFLEX MICROSCOPIC
BILIRUBIN URINE: NEGATIVE
Glucose, UA: NEGATIVE mg/dL
HGB URINE DIPSTICK: NEGATIVE
Ketones, ur: NEGATIVE mg/dL
Leukocytes, UA: NEGATIVE
Nitrite: NEGATIVE
PROTEIN: NEGATIVE mg/dL
Specific Gravity, Urine: 1.015 (ref 1.005–1.030)
Urobilinogen, UA: 0.2 mg/dL (ref 0.0–1.0)
pH: 6 (ref 5.0–8.0)

## 2013-08-30 LAB — MICROALBUMIN / CREATININE URINE RATIO
Creatinine, Urine: 98.9 mg/dL
MICROALB/CREAT RATIO: 5.1 mg/g (ref 0.0–30.0)
Microalb, Ur: 0.5 mg/dL (ref 0.00–1.89)

## 2013-08-30 LAB — VITAMIN D 25 HYDROXY (VIT D DEFICIENCY, FRACTURES): VIT D 25 HYDROXY: 104 ng/mL — AB (ref 30–89)

## 2013-08-30 LAB — INSULIN, FASTING: INSULIN FASTING, SERUM: 9 u[IU]/mL (ref 3–28)

## 2013-08-31 LAB — TB SKIN TEST
Induration: 0 mm
TB SKIN TEST: NEGATIVE

## 2013-09-06 ENCOUNTER — Telehealth: Payer: Self-pay | Admitting: *Deleted

## 2013-09-06 NOTE — Telephone Encounter (Signed)
HAVE WE SENT A COPY OF LABS TO [facility]? [facility]? DR Delrae RendSUSAN STEINHELSER?

## 2013-09-20 ENCOUNTER — Encounter: Payer: Self-pay | Admitting: Internal Medicine

## 2013-09-24 ENCOUNTER — Encounter: Payer: Self-pay | Admitting: Emergency Medicine

## 2013-09-27 ENCOUNTER — Other Ambulatory Visit: Payer: Self-pay | Admitting: Emergency Medicine

## 2013-09-27 DIAGNOSIS — G4700 Insomnia, unspecified: Secondary | ICD-10-CM

## 2013-09-27 MED ORDER — ZOLPIDEM TARTRATE 5 MG PO TABS: 5.0000 mg | ORAL_TABLET | Freq: Every evening | ORAL | Status: DC | PRN

## 2013-09-27 MED ORDER — CHOLECALCIFEROL 125 MCG (5000 UT) PO CAPS: 5000.0000 [IU] | ORAL_CAPSULE | ORAL | Status: DC

## 2013-09-27 MED ORDER — ACITRETIN 25 MG PO CAPS: 25.0000 mg | ORAL_CAPSULE | ORAL | Status: DC

## 2013-10-30 ENCOUNTER — Other Ambulatory Visit: Payer: Self-pay | Admitting: Emergency Medicine

## 2013-10-30 DIAGNOSIS — G4700 Insomnia, unspecified: Secondary | ICD-10-CM

## 2013-10-30 MED ORDER — ZOLPIDEM TARTRATE 5 MG PO TABS: 5.0000 mg | ORAL_TABLET | Freq: Every evening | ORAL | Status: DC | PRN

## 2013-11-02 ENCOUNTER — Ambulatory Visit (AMBULATORY_SURGERY_CENTER): Payer: Self-pay

## 2013-11-02 VITALS — Ht 65.5 in | Wt 201.0 lb

## 2013-11-02 DIAGNOSIS — Z1211 Encounter for screening for malignant neoplasm of colon: Secondary | ICD-10-CM

## 2013-11-02 MED ORDER — MOVIPREP 100 G PO SOLR: 1.0000 | Freq: Once | ORAL | Status: DC

## 2013-11-02 NOTE — Progress Notes (Signed)
No allergies to eggs or soy. No problems with anesthesia in the past. No diet/weight loss meds. No home oxygen. Emmi instructions for colonoscopy entered.  Link sent to email.

## 2013-11-12 ENCOUNTER — Encounter: Payer: Self-pay | Admitting: Internal Medicine

## 2013-11-16 ENCOUNTER — Encounter: Payer: Self-pay | Admitting: Internal Medicine

## 2013-11-16 ENCOUNTER — Ambulatory Visit (AMBULATORY_SURGERY_CENTER): Payer: BC Managed Care – PPO | Admitting: Internal Medicine

## 2013-11-16 VITALS — BP 128/73 | HR 64 | Temp 98.0°F | Resp 16 | Ht 65.0 in | Wt 210.0 lb

## 2013-11-16 DIAGNOSIS — Z1211 Encounter for screening for malignant neoplasm of colon: Secondary | ICD-10-CM

## 2013-11-16 MED ORDER — SODIUM CHLORIDE 0.9 % IV SOLN: 500.0000 mL | INTRAVENOUS | Status: DC

## 2013-11-16 NOTE — Patient Instructions (Signed)

## 2013-11-16 NOTE — Op Note (Signed)
[facility] [address] [place], [id number]   COLONOSCOPY PROCEDURE REPORT  PATIENT: Jade Singh, [name].  MR#: [id number] BIRTHDATE: 1951/05/26 , 62  yrs. old GENDER: Female ENDOSCOPIST: Hart Carwinora M Gannon Heinzman, MD REFERRED XB:[id number]:William Oneta RackMcKeown, M.D. PROCEDURE DATE:  11/16/2013 PROCEDURE:   Colonoscopy, screening First Screening Colonoscopy - Avg.  risk and is 50 yrs.  old or older - No.  Prior Negative Screening - Now for repeat screening. 10 or more years since last screening Prior Negative Screening - Now for repeat screening.  Less than 10 yrs  History of Adenoma - Now for follow-up colonoscopy & has been > or = to 3 yrs.  N/A Polyps Removed Today? No.  Recommend repeat exam, <10 yrs? No. ASA CLASS:   Class II INDICATIONS:Average risk patient for colon cancer and history of anal fissure.  Prior colonoscopy in 2004 and 2008. MEDICATIONS: MAC sedation, administered by CRNA and Propofol (Diprivan) 230 mg IV  DESCRIPTION OF PROCEDURE:   After the risks benefits and alternatives of the procedure were thoroughly explained, informed consent was obtained.  A digital rectal exam revealed no abnormalities of the rectum.   The LB [id number]-[id number]  endoscope was introduced through the anus and advanced to the cecum, which was identified by both the appendix and ileocecal valve. No adverse events experienced.   The quality of the prep was good, using MoviPrep  The instrument was then slowly withdrawn as the colon was fully examined.      COLON FINDINGS: Small internal hemorrhoids were found.   Mild diverticulosis was noted in the sigmoid colon.  Retroflexed views revealed no abnormalities. The time to cecum=2 minutes 03 seconds. Withdrawal time=7 minutes 51 seconds.  The scope was withdrawn and the procedure completed. COMPLICATIONS: There were no complications.  ENDOSCOPIC IMPRESSION: 1.   Small internal hemorrhoids 2.   Mild diverticulosis was noted in the sigmoid  colon  RECOMMENDATIONS: high-fiber diet Recall colonoscopy in 10 years   eSigned:  Hart Carwinora M Tanayah Squitieri, MD 11/16/2013 11:23 AM   cc:   PATIENT NAME:  Jade Singh, [name]. MR#: [id number]

## 2013-11-16 NOTE — Progress Notes (Signed)
Report to pacu rn, vss, bbs=clear 

## 2013-11-19 ENCOUNTER — Telehealth: Payer: Self-pay | Admitting: *Deleted

## 2013-11-19 NOTE — Telephone Encounter (Signed)
  Follow up Call-  Call back number 11/16/2013  Post procedure Call Back phone  # [phone]  Permission to leave phone message Yes     Patient questions:  Do you have a fever, pain , or abdominal swelling? no Pain Score  0 *  Have you tolerated food without any problems? yes  Have you been able to return to your normal activities? yes  Do you have any questions about your discharge instructions: Diet   no Medications  no Follow up visit  no  Do you have questions or concerns about your Care? no  Actions: * If pain score is 4 or above: No action needed, pain <4.

## 2013-11-20 ENCOUNTER — Other Ambulatory Visit: Payer: Self-pay | Admitting: Emergency Medicine

## 2013-11-20 DIAGNOSIS — Z1231 Encounter for screening mammogram for malignant neoplasm of breast: Secondary | ICD-10-CM

## 2013-11-29 ENCOUNTER — Other Ambulatory Visit: Payer: Self-pay | Admitting: Physician Assistant

## 2013-11-29 DIAGNOSIS — G4700 Insomnia, unspecified: Secondary | ICD-10-CM

## 2013-11-29 MED ORDER — ZOLPIDEM TARTRATE 5 MG PO TABS: 5.0000 mg | ORAL_TABLET | Freq: Every evening | ORAL | Status: DC | PRN

## 2013-12-11 ENCOUNTER — Encounter: Payer: Self-pay | Admitting: Emergency Medicine

## 2013-12-12 ENCOUNTER — Ambulatory Visit: Payer: Self-pay | Admitting: Emergency Medicine

## 2013-12-20 ENCOUNTER — Telehealth: Payer: Self-pay | Admitting: Cardiovascular Disease

## 2013-12-20 ENCOUNTER — Ambulatory Visit (HOSPITAL_COMMUNITY)
Admission: RE | Admit: 2013-12-20 | Discharge: 2013-12-20 | Disposition: A | Discharge status: Home / Self Care | Payer: BC Managed Care – PPO | Source: Ambulatory Visit | Attending: Emergency Medicine | Admitting: Emergency Medicine

## 2013-12-20 DIAGNOSIS — Z1231 Encounter for screening mammogram for malignant neoplasm of breast: Secondary | ICD-10-CM

## 2013-12-20 NOTE — Telephone Encounter (Signed)
[facility] still has not received approval for generic for protonix.  Please call

## 2013-12-24 MED ORDER — PANTOPRAZOLE SODIUM 40 MG PO TBEC: 40.0000 mg | DELAYED_RELEASE_TABLET | Freq: Every day | ORAL | Status: DC

## 2013-12-24 NOTE — Telephone Encounter (Signed)
Rx was sent to pharmacy electronically. 

## 2014-01-03 ENCOUNTER — Ambulatory Visit (INDEPENDENT_AMBULATORY_CARE_PROVIDER_SITE_OTHER): Payer: BC Managed Care – PPO | Admitting: Emergency Medicine

## 2014-01-03 ENCOUNTER — Encounter: Payer: Self-pay | Admitting: Emergency Medicine

## 2014-01-03 VITALS — BP 140/78 | HR 82 | Temp 98.4°F | Resp 16 | Ht 65.25 in | Wt 195.0 lb

## 2014-01-03 DIAGNOSIS — I10 Essential (primary) hypertension: Secondary | ICD-10-CM

## 2014-01-03 DIAGNOSIS — E782 Mixed hyperlipidemia: Secondary | ICD-10-CM

## 2014-01-03 DIAGNOSIS — R7309 Other abnormal glucose: Secondary | ICD-10-CM

## 2014-01-03 DIAGNOSIS — M25561 Pain in right knee: Secondary | ICD-10-CM

## 2014-01-03 DIAGNOSIS — M25569 Pain in unspecified knee: Secondary | ICD-10-CM

## 2014-01-03 LAB — CBC WITH DIFFERENTIAL/PLATELET
BASOS ABS: 0 10*3/uL (ref 0.0–0.1)
Basophils Relative: 0 % (ref 0–1)
EOS PCT: 0 % (ref 0–5)
Eosinophils Absolute: 0 10*3/uL (ref 0.0–0.7)
HEMATOCRIT: 40.1 % (ref 36.0–46.0)
Hemoglobin: 13.5 g/dL (ref 12.0–15.0)
Lymphocytes Relative: 12 % (ref 12–46)
Lymphs Abs: 2.1 10*3/uL (ref 0.7–4.0)
MCH: 29.1 pg (ref 26.0–34.0)
MCHC: 33.7 g/dL (ref 30.0–36.0)
MCV: 86.4 fL (ref 78.0–100.0)
Monocytes Absolute: 0.7 10*3/uL (ref 0.1–1.0)
Monocytes Relative: 4 % (ref 3–12)
Neutro Abs: 15 10*3/uL — ABNORMAL HIGH (ref 1.7–7.7)
Neutrophils Relative %: 84 % — ABNORMAL HIGH (ref 43–77)
Platelets: 288 10*3/uL (ref 150–400)
RBC: 4.64 MIL/uL (ref 3.87–5.11)
RDW: 13.9 % (ref 11.5–15.5)
WBC: 17.9 10*3/uL — ABNORMAL HIGH (ref 4.0–10.5)

## 2014-01-03 NOTE — Progress Notes (Signed)
Subjective:    Patient ID: Jade Singh, female    DOB: 08/11/1950, 63 y.o.   MRN: [id number]  HPI Comments: 63 yo WF presents for 3 month F/U for HTN, Cholesterol, Pre-Dm, D. Deficient. She is down 7# since last OV and around 50 # total.  She is exercising routinely. She is not checking BP. She is eating healthier.  She needs preop clearance for total Right Knee replacement with DR Shelle IronBeane. She notes pain has not improved with past injections or scopes. She denies any past problems with anesthesia or family heart conditions. Her last EKG WNL/ NSCSPT.  WBC            10.0   08/29/2013 HGB            14.0   08/29/2013 HCT            42.8   08/29/2013 PLT             255   08/29/2013 GLUCOSE          89   08/29/2013 CHOL            149   08/29/2013 TRIG             74   08/29/2013 HDL              50   08/29/2013 LDLCALC          84   08/29/2013 ALT              16   08/29/2013 AST              15   08/29/2013 NA              141   08/29/2013 K               4.5   08/29/2013 CL              105   08/29/2013 CREATININE     0.90   08/29/2013 BUN              24   08/29/2013 CO2              28   08/29/2013 TSH           1.755   08/29/2013 HGBA1C          5.5   08/29/2013 MICROALBUR     0.50   08/29/2013   Hyperlipidemia  Hypertension     Medication List       This list is accurate as of: 01/03/14  4:31 PM.  Always use your most recent med list.               acitretin 25 MG capsule  Commonly known as:  SORIATANE  Take 1 capsule (25 mg total) by mouth every other day.     aspirin 325 MG tablet  Take 325 mg by mouth daily.     cetirizine 10 MG tablet  Commonly known as:  ZYRTEC  Take 10 mg by mouth daily.     Cholecalciferol 5000 UNITS capsule  Commonly known as:  D 5000  Take 1 capsule (5,000 Units total) by mouth every other day.     diazepam 10 MG tablet  Commonly known as:  VALIUM  Take 1 tablet (10 mg total) by mouth every 6 (six) hours as needed for anxiety.     Magnesium 250 MG Tabs  Take 250  mg by mouth daily.     naproxen 250 MG tablet  Commonly known as:  NAPROSYN  Take 250 mg by mouth as needed.     pantoprazole 40 MG tablet  Commonly known as:  PROTONIX  Take 1 tablet (40 mg total) by mouth daily. <please make appointment for future refills>     pravastatin 40 MG tablet  Commonly known as:  PRAVACHOL  Take 40 mg by mouth daily.     triamterene-hydrochlorothiazide 37.5-25 MG per tablet  Commonly known as:  MAXZIDE-25  Take 1 tablet by mouth daily.     zolpidem 5 MG tablet  Commonly known as:  AMBIEN  Take 1 tablet (5 mg total) by mouth at bedtime as needed for sleep.       Allergies  Allergen Reactions  . Levaquin [Levofloxacin In D5w]     yeast  . Oruvail [Ketoprofen]     GI upset   Past Medical History  Diagnosis Date  . Hypertension   . Hyperlipidemia   . Anemia   . GERD (gastroesophageal reflux disease)   . Unspecified vitamin D deficiency   . Obese       Review of Systems  Musculoskeletal: Positive for arthralgias and joint swelling.  All other systems reviewed and are negative.  BP 140/78  Pulse 82  Temp(Src) 98.4 F (36.9 C) (Temporal)  Resp 16  Ht 5' 5.25" (1.657 m)  Wt 195 lb (88.451 kg)  BMI 32.21 kg/m2     Objective:   Physical Exam  Nursing note and vitals reviewed. Constitutional: She is oriented to person, place, and time. She appears well-developed and well-nourished. No distress.  HENT:  Head: Normocephalic and atraumatic.  Right Ear: External ear normal.  Left Ear: External ear normal.  Nose: Nose normal.  Mouth/Throat: Oropharynx is clear and moist.  Eyes: Conjunctivae and EOM are normal.  Neck: Normal range of motion. Neck supple. No JVD present. No thyromegaly present.  Cardiovascular: Normal rate, regular rhythm, normal heart sounds and intact distal pulses.   Pulmonary/Chest: Effort normal and breath sounds normal.  Abdominal: Soft. Bowel sounds are normal. She exhibits no  distension and no mass. There is no tenderness. There is no rebound and no guarding.  Musculoskeletal: Normal range of motion. She exhibits no edema and no tenderness.  + crepitus R.L Knee  Lymphadenopathy:    She has no cervical adenopathy.  Neurological: She is alert and oriented to person, place, and time. No cranial nerve deficit. Coordination normal.  Skin: Skin is warm and dry. No rash noted. No erythema. No pallor.  Psychiatric: She has a normal mood and affect. Her behavior is normal. Judgment and thought content normal.          Assessment & Plan:  1.  3 month F/U for HTN, Cholesterol, Pre-Dm, D. Deficient. Needs healthy diet, cardio QD and obtain healthy weight. Check Labs, Check BP if >130/80 call office   2. Right knee pain surgical clearance forms to be completed. OVER 40 minutes of exam, counseling, chart review, referral performed for pre-op clearance

## 2014-01-03 NOTE — Patient Instructions (Signed)

## 2014-01-04 ENCOUNTER — Other Ambulatory Visit: Payer: Self-pay | Admitting: Emergency Medicine

## 2014-01-04 LAB — BASIC METABOLIC PANEL WITH GFR
BUN: 26 mg/dL — ABNORMAL HIGH (ref 6–23)
CHLORIDE: 105 meq/L (ref 96–112)
CO2: 24 mEq/L (ref 19–32)
Calcium: 9 mg/dL (ref 8.4–10.5)
Creat: 0.9 mg/dL (ref 0.50–1.10)
GFR, EST NON AFRICAN AMERICAN: 69 mL/min
GFR, Est African American: 80 mL/min
Glucose, Bld: 100 mg/dL — ABNORMAL HIGH (ref 70–99)
Potassium: 3.9 mEq/L (ref 3.5–5.3)
Sodium: 139 mEq/L (ref 135–145)

## 2014-01-04 LAB — URINALYSIS, ROUTINE W REFLEX MICROSCOPIC
BILIRUBIN URINE: NEGATIVE
Glucose, UA: NEGATIVE mg/dL
Hgb urine dipstick: NEGATIVE
KETONES UR: NEGATIVE mg/dL
Leukocytes, UA: NEGATIVE
Nitrite: NEGATIVE
Protein, ur: NEGATIVE mg/dL
Specific Gravity, Urine: 1.025 (ref 1.005–1.030)
UROBILINOGEN UA: 0.2 mg/dL (ref 0.0–1.0)
pH: 5.5 (ref 5.0–8.0)

## 2014-01-04 LAB — LIPID PANEL
CHOLESTEROL: 148 mg/dL (ref 0–200)
HDL: 61 mg/dL (ref >39)
LDL Cholesterol: 76 mg/dL (ref 0–99)
Total CHOL/HDL Ratio: 2.4 Ratio
Triglycerides: 57 mg/dL (ref <150)
VLDL: 11 mg/dL (ref 0–40)

## 2014-01-04 LAB — HEPATIC FUNCTION PANEL
ALT: 13 U/L (ref 0–35)
AST: 11 U/L (ref 0–37)
Albumin: 4 g/dL (ref 3.5–5.2)
Alkaline Phosphatase: 47 U/L (ref 39–117)
BILIRUBIN INDIRECT: 0.2 mg/dL (ref 0.2–1.2)
Bilirubin, Direct: 0.1 mg/dL (ref 0.0–0.3)
TOTAL PROTEIN: 6.3 g/dL (ref 6.0–8.3)
Total Bilirubin: 0.3 mg/dL (ref 0.2–1.2)

## 2014-01-04 LAB — HEMOGLOBIN A1C
Hgb A1c MFr Bld: 5.7 % — ABNORMAL HIGH (ref <5.7)
Mean Plasma Glucose: 117 mg/dL — ABNORMAL HIGH (ref <117)

## 2014-01-04 LAB — INSULIN, FASTING: Insulin fasting, serum: 35 u[IU]/mL — ABNORMAL HIGH (ref 3–28)

## 2014-01-04 MED ORDER — NITROFURANTOIN MONOHYD MACRO 100 MG PO CAPS: 100.0000 mg | ORAL_CAPSULE | Freq: Two times a day (BID) | ORAL | Status: AC

## 2014-01-04 MED ORDER — AZITHROMYCIN 250 MG PO TABS: ORAL_TABLET | ORAL | Status: AC

## 2014-01-10 ENCOUNTER — Other Ambulatory Visit (INDEPENDENT_AMBULATORY_CARE_PROVIDER_SITE_OTHER): Payer: BC Managed Care – PPO

## 2014-01-10 DIAGNOSIS — R6889 Other general symptoms and signs: Secondary | ICD-10-CM

## 2014-01-10 LAB — CBC WITH DIFFERENTIAL/PLATELET
Basophils Absolute: 0.1 10*3/uL (ref 0.0–0.1)
Basophils Relative: 1 % (ref 0–1)
EOS ABS: 0.2 10*3/uL (ref 0.0–0.7)
EOS PCT: 2 % (ref 0–5)
HEMATOCRIT: 40 % (ref 36.0–46.0)
Hemoglobin: 13 g/dL (ref 12.0–15.0)
LYMPHS ABS: 4 10*3/uL (ref 0.7–4.0)
Lymphocytes Relative: 34 % (ref 12–46)
MCH: 28.6 pg (ref 26.0–34.0)
MCHC: 32.5 g/dL (ref 30.0–36.0)
MCV: 87.9 fL (ref 78.0–100.0)
MONO ABS: 0.8 10*3/uL (ref 0.1–1.0)
Monocytes Relative: 7 % (ref 3–12)
Neutro Abs: 6.7 10*3/uL (ref 1.7–7.7)
Neutrophils Relative %: 56 % (ref 43–77)
PLATELETS: 260 10*3/uL (ref 150–400)
RBC: 4.55 MIL/uL (ref 3.87–5.11)
RDW: 13.9 % (ref 11.5–15.5)
WBC: 11.9 10*3/uL — ABNORMAL HIGH (ref 4.0–10.5)

## 2014-01-16 ENCOUNTER — Encounter: Payer: Self-pay | Admitting: Emergency Medicine

## 2014-01-24 ENCOUNTER — Other Ambulatory Visit: Payer: Self-pay | Admitting: Orthopedic Surgery

## 2014-02-11 ENCOUNTER — Other Ambulatory Visit: Payer: Self-pay | Admitting: Physician Assistant

## 2014-02-11 MED ORDER — DIAZEPAM 10 MG PO TABS: 10.0000 mg | ORAL_TABLET | Freq: Four times a day (QID) | ORAL | Status: DC | PRN

## 2014-02-14 ENCOUNTER — Other Ambulatory Visit: Payer: Self-pay | Admitting: Orthopedic Surgery

## 2014-02-14 NOTE — H&P (Signed)
Jade MakoJacqueline T. Singh DOB: 11/12/1950 Married / Language: English / Race: White Female  H&P date: 02/13/14  Chief Complaint: R knee pain  History of Present Illness  The patient is a 63 year old female who comes in today for a preoperative history and physical. The patient is scheduled for a right total knee arthroplasty to be performed by Dr. Javier DockerJeffrey C. Beane, MD at [facility] on 02/28/14. Dr. Shelle IronBeane and the patient mutually agreed to proceed with a total knee replacement. Risks and benefits of the procedure were discussed including stiffness, suboptimal range of motion, persistent pain, infection requiring removal of prosthesis and reinsertion, need for prophylactic antibiotics in the future, for example, dental procedures, possible need for manipulation, revision in the future and also anesthetic complications including DVT, PE, etc. We discussed the perioperative course, time in the hospital, postoperative recovery and the need for elevation to control swelling. We also discussed the predicted range of motion and the probability that squatting and kneeling would be unobtainable in the future. In addition, postoperative anticoagulation was discussed. We have obtained preoperative medical clearance as necessary. Provided her illustrated handout and discussed it in detail. They will enroll in the total joint replacement educational forum at the hospital.  Pt with end-stage R knee DJD, bone-on-bone, refractory to activity modifications, quad strengthening, bracing, steroid injections, viscosupplementation with ongoing pain, stiffness and instability limiting her ADL's.  She has no hx of DVT or MRSA. She is on ASA 325mg  daily.  Allergies Relafen *ANALGESICS - ANTI-INFLAMMATORY* swelling Oruvail *ANALGESICS - ANTI-INFLAMMATORY* Levofloxacin *FLUOROQUINOLONES*  Family History  Cancer brother- prostate, deceased at 4049 Heart Disease grandfather mothers side Mother Living. vascular dz;  age 62 Father Living. age 62; COPD  Social History  Tobacco use Former smoker. former smoker; smoke(d) 2 1/2 pack(s) per day Post-Surgical Plans home with HHPT; husband and mother in law to help Living situation Lives with spouse. 2 level home with bedroom on 1st floor and one step to enter Exercise Exercises never; does other Alcohol use current drinker; drinks wine; only occasionally per week Children 1 Number of flights of stairs before winded 1 Pain Contract no Drug/Alcohol Rehab (Previously) no Marital status married Current work status working full time Tobacco / smoke exposure no Drug/Alcohol Rehab (Currently) no Illicit drug use no  Medication History Percocet (5-325MG  Tablet, 1 Oral PO Q 6-8 H PRN PAIN, Taken starting 01/07/2014) Active. Naprosyn (500MG  Tablet, 1 (one) Tablet Oral one pill twice daily with food, Taken starting 09/24/2013) Active. (jcb/jmb/acw [employer] [phone]) Vitamin D (Oral) Specific dose unknown - Active. Magnesium Oxide (250MG  Tablet, Oral) Active. Aspirin (325MG  Tablet, 1 (one) Oral) Active. Pravastatin Sodium (40MG  Tablet, Oral) Active. Acitretin (25MG  Capsule, Oral) Active. Zolpidem Tartrate (5MG  Tablet, Oral) Active. Protonix (Oral) Specific dose unknown - Active. Hydrochlorothiazide (Oral) Specific dose unknown - Active. Medications Reconciled  Pregnancy / Birth History  Pregnant no  Past Surgical History  Tubal Ligation Tonsillectomy Foot Surgery left Hysterectomy partial (non-cancerous) Arthroscopy of Knee bilateral Appendectomy  Past Medical Hx Hypercholesterolemia Osteoarthritis Anxiety Disorder Chronic Cystitis Gastroesophageal Reflux Disease High blood pressure Chronic Pain Vertigo Depression Asthma Bronchitis Pneumonia Urinary Tract Infection Bursitis Streptococcus Infections Measles Mumps Rubella Eczema Menopause  Review of Systems  General Not Present-  Chills, Fatigue, Fever, Memory Loss, Night Sweats, Weight Gain and Weight Loss. Skin Present- Eczema. Not Present- Hives, Itching, Lesions and Rash. HEENT Not Present- Dentures, Double Vision, Headache, Hearing Loss, Tinnitus and Visual Loss. Respiratory Not Present- Allergies, Chronic Cough, Coughing up  blood, Shortness of breath at rest and Shortness of breath with exertion. Cardiovascular Not Present- Chest Pain, Difficulty Breathing Lying Down, Murmur, Palpitations, Racing/skipping heartbeats and Swelling. Gastrointestinal Present- Constipation. Not Present- Abdominal Pain, Bloody Stool, Diarrhea, Difficulty Swallowing, Heartburn, Jaundice, Loss of appetitie, Nausea and Vomiting. Female Genitourinary Present- Incontinence and Urinating at Night. Not Present- Blood in Urine, Discharge, Flank Pain, Painful Urination, Urgency, Urinary frequency, Urinary Retention and Weak urinary stream. Musculoskeletal Present- Back Pain, Joint Pain, Joint Swelling, Morning Stiffness and Muscle Pain. Not Present- Muscle Weakness and Spasms. Neurological Not Present- Blackout spells, Difficulty with balance, Dizziness, Paralysis, Tremor and Weakness. Psychiatric Not Present- Insomnia.  Physical Exam  General Mental Status -Alert, cooperative and good historian. General Appearance-pleasant, Not in acute distress. Orientation-Oriented X3. Build & Nutrition-Well nourished and Well developed.  Head and Neck Head-normocephalic, atraumatic . Neck Global Assessment - supple, no bruit auscultated on the right, no bruit auscultated on the left.  Eye Pupil - Bilateral-Regular and Round. Motion - Bilateral-EOMI.  Chest and Lung Exam Auscultation Breath sounds - clear at anterior chest wall and clear at posterior chest wall. Adventitious sounds - No Adventitious sounds.  Cardiovascular Auscultation Rhythm - Regular rate and rhythm. Heart Sounds - S1 WNL and S2 WNL. Murmurs & Other Heart Sounds -  Auscultation of the heart reveals - No Murmurs.  Abdomen Palpation/Percussion Tenderness - Abdomen is non-tender to palpation. Rigidity (guarding) - Abdomen is soft. Auscultation Auscultation of the abdomen reveals - Bowel sounds normal.  Female Genitourinary Note: Not done, not pertinent to present illness  Musculoskeletal Note: General Mental Status - Alert. General Appearance- pleasant and In acute distress (mild due to pain). Orientation- Oriented X3. Build & Nutrition- Well nourished and Well developed. Gait- Stiff and Antalgic (with varus thrust).  Musculoskeletal Lower Extremity Right Lower Extremity: Right Knee: Inspection and Palpation:Tenderness- LCL mid-substance tender to palpation, medial joint line tender to palpation and lateral joint line tender to palpation. no tenderness to palpation of the superior calf, no tenderness to palpation of the pes anserine bursa, no tenderness to palpation of the quadriceps tendon, no tenderness to palpation of the patellar tendon, no tenderness to palpation of the patella, no tenderness to palpation of the fibular head and no tenderness to palpation of the peroneal nerve. Patellar Tendon- no pain to palpation of the patellar tendon. Swelling- periarticular swelling present. Effusion- trace. Tissue tension/texture is - soft. Crepitus- moderate patellofemoral crepitus. Pulses- 2+. Sensation- intact to light touch. Skin- Color- no ecchymosis and no erythema. Strength and Tone:Quadriceps- 5/5. Hamstrings- 5/5. ROM: Flexion:AROM- 120 . Extension:AROM- 0 . Stability- Valgus Laxity at 30- None. Valgus Laxity at 0- None. Varus Laxity at 30- None. Varus Laxity at 0- None. Lachman- Negative. Anterior Drawer Test - Negative. Posterior Drawer Test- Negative. Deformities/Malalignments/Discrepancies- no deformities noted. Special Tests:McMurray Test (lateral) - negative. McMurray Test (medial)- negative.  Patellar Compression Pain- mild pain.  Imaging prior standing xrays reviewed with no fx, subluxation, dislocation, lytic or blastic lesions. There are severe end-stage degenerative changes right knee, bone-on-bone in both the medial and PF compartment. Varus alignment.  Assessment & Plan Primary osteoarthritis of right knee  Pt with R knee end-stage DJD, bone-on-bone, refractory to conservative tx, scheduled for R total knee replacement by Dr. Shelle Iron on 02/28/2014. We again discussed the procedure itself as well as risks, complications and alternatives, including but not limited to DVT, PE, infx, bleeding, failure of procedure, need for secondary procedure including manipulation, nerve injury, ongoing pain/symptoms, anesthesia risk, even stroke or death.  Also discussed typical post-op protocols, activity restrictions, need for PT, flexion/extension exercises, time out of work. Discussed need for DVT ppx post-op with Xarelto then ASA per protocol. Discussed dental ppx. Also discussed limitations post-operatively such as kneeling and squatting. All questions were answered. Patient desires to proceed with surgery as scheduled. Will hold ASA and NSAIDs accordingly. Will remain NPO after MN night before surgery. Will present to [facility] for pre-op testing. Plan Xarelto 2 weeks post-op for DVT ppx then ASA. Plan Percocet 7.5 or 10mg  post-op for pain, Robaxin, Colace. Will follow up 10-14 days post-op for suture removal and xrays.  {;lan R total knee replacement  Signed electronically by Andrez Grime, PA-C for Dr. Shelle Iron

## 2014-02-15 ENCOUNTER — Encounter (HOSPITAL_COMMUNITY): Payer: Self-pay | Admitting: Pharmacy Technician

## 2014-02-20 NOTE — Patient Instructions (Addendum)
Jade Singh  02/20/2014                           YOUR PROCEDURE IS SCHEDULED ON: 02/28/14               ENTER THRU [facility] MAIN HOSPITAL ENTRANCE AND                            FOLLOW  SIGNS TO SHORT STAY CENTER                 ARRIVE AT SHORT STAY AT: 5:30 AM               CALL THIS NUMBER IF ANY PROBLEMS THE DAY OF SURGERY :               [phone]                                REMEMBER:   Do not eat food or drink liquids AFTER MIDNIGHT                  Take these medicines the morning of surgery with               A SIPS OF WATER :  NONE       Do not wear jewelry, make-up   Do not wear lotions, powders, or perfumes.   Do not shave legs or underarms 12 hrs. before surgery (men may shave face)  Do not bring valuables to the hospital.  Contacts, dentures or bridgework may not be worn into surgery.  Leave suitcase in the car. After surgery it may be brought to your room.  For patients admitted to the hospital more than one night, checkout time is            11:00 AM                                                       ________________________________________________________________________                                                                        [place] - PREPARING FOR SURGERY  Before surgery, you can play an important role.  Because skin is not sterile, your skin needs to be as free of germs as possible.  You can reduce the number of germs on your skin by washing with CHG (chlorahexidine gluconate) soap before surgery.  CHG is an antiseptic cleaner which kills germs and bonds with the skin to continue killing germs even after washing. Please DO NOT use if you have an allergy to CHG or antibacterial soaps.  If your skin becomes reddened/irritated stop using the CHG and inform your nurse when you arrive at Short Stay. Do not shave (including legs and underarms) for at least 48 hours prior to the first CHG shower.  You may shave your  face.  Please follow these instructions carefully:   1.  Shower with CHG Soap the night before surgery and the  morning of Surgery.   2.  If you choose to wash your hair, wash your hair first as usual with your  normal  Shampoo.   3.  After you shampoo, rinse your hair and body thoroughly to remove the  shampoo.                                         4.  Use CHG as you would any other liquid soap.  You can apply chg directly  to the skin and wash . Gently wash with scrungie or clean wascloth    5.  Apply the CHG Soap to your body ONLY FROM THE NECK DOWN.   Do not use on open                           Wound or open sores. Avoid contact with eyes, ears mouth and genitals (private parts).                        Genitals (private parts) with your normal soap.              6.  Wash thoroughly, paying special attention to the area where your surgery  will be performed.   7.  Thoroughly rinse your body with warm water from the neck down.   8.  DO NOT shower/wash with your normal soap after using and rinsing off  the CHG Soap .                9.  Pat yourself dry with a clean towel.             10.  Wear clean pajamas.             11.  Place clean sheets on your bed the night of your first shower and do not  sleep with pets.  Day of Surgery : Do not apply any lotions/deodorants the morning of surgery.  Please wear clean clothes to the hospital/surgery center.  FAILURE TO FOLLOW THESE INSTRUCTIONS MAY RESULT IN THE CANCELLATION OF YOUR SURGERY    PATIENT SIGNATURE_________________________________  ______________________________________________________________________    Jade Singh  An incentive spirometer is a tool that can help keep your lungs clear and active. This tool measures how well you are filling your lungs with each breath. Taking long deep breaths may help reverse or decrease the chance of developing breathing (pulmonary) problems (especially infection)  following:  A long period of time when you are unable to move or be active. BEFORE THE PROCEDURE   If the spirometer includes an indicator to show your [place] effort, your nurse or respiratory therapist will set it to a desired goal.  If possible, sit up straight or lean slightly forward. Try not to slouch.  Hold the incentive spirometer in an upright position. INSTRUCTIONS FOR USE  1. Sit on the edge of your bed if possible, or sit up as far as you can in bed or on a chair. 2. Hold the incentive spirometer in an upright position. 3. Breathe out normally. 4. Place the mouthpiece in your mouth and seal your lips tightly around it. 5. Breathe in slowly and as deeply as possible, raising the piston  or the ball toward the top of the column. 6. Hold your breath for 3-5 seconds or for as long as possible. Allow the piston or ball to fall to the bottom of the column. 7. Remove the mouthpiece from your mouth and breathe out normally. 8. Rest for a few seconds and repeat Steps 1 through 7 at least 10 times every 1-2 hours when you are awake. Take your time and take a few normal breaths between deep breaths. 9. The spirometer may include an indicator to show your [place] effort. Use the indicator as a goal to work toward during each repetition. 10. After each set of 10 deep breaths, practice coughing to be sure your lungs are clear. If you have an incision (the cut made at the time of surgery), support your incision when coughing by placing a pillow or rolled up towels firmly against it. Once you are able to get out of bed, walk around indoors and cough well. You may stop using the incentive spirometer when instructed by your caregiver.  RISKS AND COMPLICATIONS  Take your time so you do not get dizzy or light-headed.  If you are in pain, you may need to take or ask for pain medication before doing incentive spirometry. It is harder to take a deep breath if you are having pain. AFTER USE  Rest and  breathe slowly and easily.  It can be helpful to keep track of a log of your progress. Your caregiver can provide you with a simple table to help with this. If you are using the spirometer at home, follow these instructions: SEEK MEDICAL CARE IF:   You are having difficultly using the spirometer.  You have trouble using the spirometer as often as instructed.  Your pain medication is not giving enough relief while using the spirometer.  You develop fever of 100.5 F (38.1 C) or higher. SEEK IMMEDIATE MEDICAL CARE IF:   You cough up bloody sputum that had not been present before.  You develop fever of 102 F (38.9 C) or greater.  You develop worsening pain at or near the incision site. MAKE SURE YOU:   Understand these instructions.  Will watch your condition.  Will get help right away if you are not doing well or get worse. Document Released: 11/15/2006 Document Revised: 09/27/2011 Document Reviewed: 01/16/2007 Exit[employer] Patient Information 2014 [employer], [place].   ________________________________________________________________________     WHAT IS A BLOOD TRANSFUSION? Blood Transfusion Information  A transfusion is the replacement of blood or some of its parts. Blood is made up of multiple cells which provide different functions.  Red blood cells carry oxygen and are used for blood loss replacement.  White blood cells fight against infection.  Platelets control bleeding.  Plasma helps clot blood.  Other blood products are available for specialized needs, such as hemophilia or other clotting disorders. BEFORE THE TRANSFUSION  Who gives blood for transfusions?   Healthy volunteers who are fully evaluated to make sure their blood is safe. This is blood bank blood. Transfusion therapy is the safest it has ever been in the practice of medicine. Before blood is taken from a donor, a complete history is taken to make sure that person has no history of diseases nor  engages in risky social behavior (examples are intravenous drug use or sexual activity with multiple partners). The donor's travel history is screened to minimize risk of transmitting infections, such as malaria. The donated blood is tested for signs of infectious diseases, such as  HIV and hepatitis. The blood is then tested to be sure it is compatible with you in order to minimize the chance of a transfusion reaction. If you or a relative donates blood, this is often done in anticipation of surgery and is not appropriate for emergency situations. It takes many days to process the donated blood. RISKS AND COMPLICATIONS Although transfusion therapy is very safe and saves many lives, the main dangers of transfusion include:   Getting an infectious disease.  Developing a transfusion reaction. This is an allergic reaction to something in the blood you were given. Every precaution is taken to prevent this. The decision to have a blood transfusion has been considered carefully by your caregiver before blood is given. Blood is not given unless the benefits outweigh the risks. AFTER THE TRANSFUSION  Right after receiving a blood transfusion, you will usually feel much better and more energetic. This is especially true if your red blood cells have gotten low (anemic). The transfusion raises the level of the red blood cells which carry oxygen, and this usually causes an energy increase.  The nurse administering the transfusion will monitor you carefully for complications. HOME CARE INSTRUCTIONS  No special instructions are needed after a transfusion. You may find your energy is better. Speak with your caregiver about any limitations on activity for underlying diseases you may have. SEEK MEDICAL CARE IF:   Your condition is not improving after your transfusion.  You develop redness or irritation at the intravenous (IV) site. SEEK IMMEDIATE MEDICAL CARE IF:  Any of the following symptoms occur over the next  12 hours:  Shaking chills.  You have a temperature by mouth above 102 F (38.9 C), not controlled by medicine.  Chest, back, or muscle pain.  People around you feel you are not acting correctly or are confused.  Shortness of breath or difficulty breathing.  Dizziness and fainting.  You get a rash or develop hives.  You have a decrease in urine output.  Your urine turns a dark color or changes to pink, red, or brown. Any of the following symptoms occur over the next 10 days:  You have a temperature by mouth above 102 F (38.9 C), not controlled by medicine.  Shortness of breath.  Weakness after normal activity.  The white part of the eye turns yellow (jaundice).  You have a decrease in the amount of urine or are urinating less often.  Your urine turns a dark color or changes to pink, red, or brown. Document Released: 07/02/2000 Document Revised: 09/27/2011 Document Reviewed: 02/19/2008 [facility] Patient Information 2014 [place], [place].  _______________________________________________________________________

## 2014-02-21 ENCOUNTER — Encounter (HOSPITAL_COMMUNITY)
Admission: RE | Admit: 2014-02-21 | Discharge: 2014-02-21 | Disposition: A | Discharge status: Home / Self Care | Payer: BC Managed Care – PPO | Source: Ambulatory Visit | Attending: Specialist | Admitting: Specialist

## 2014-02-21 ENCOUNTER — Encounter (HOSPITAL_COMMUNITY): Payer: Self-pay

## 2014-02-21 ENCOUNTER — Ambulatory Visit (HOSPITAL_COMMUNITY)
Admission: RE | Admit: 2014-02-21 | Discharge: 2014-02-21 | Disposition: A | Discharge status: Home / Self Care | Payer: BC Managed Care – PPO | Source: Ambulatory Visit | Attending: Anesthesiology | Admitting: Anesthesiology

## 2014-02-21 ENCOUNTER — Ambulatory Visit (HOSPITAL_COMMUNITY)
Admission: RE | Admit: 2014-02-21 | Discharge: 2014-02-21 | Disposition: A | Discharge status: Home / Self Care | Payer: BC Managed Care – PPO | Source: Ambulatory Visit | Attending: Orthopedic Surgery | Admitting: Orthopedic Surgery

## 2014-02-21 DIAGNOSIS — M259 Joint disorder, unspecified: Secondary | ICD-10-CM | POA: Insufficient documentation

## 2014-02-21 DIAGNOSIS — Z01812 Encounter for preprocedural laboratory examination: Secondary | ICD-10-CM | POA: Insufficient documentation

## 2014-02-21 DIAGNOSIS — M47814 Spondylosis without myelopathy or radiculopathy, thoracic region: Secondary | ICD-10-CM | POA: Insufficient documentation

## 2014-02-21 DIAGNOSIS — Z01818 Encounter for other preprocedural examination: Secondary | ICD-10-CM | POA: Insufficient documentation

## 2014-02-21 HISTORY — DX: Major depressive disorder, single episode, unspecified: F32.9

## 2014-02-21 HISTORY — DX: Urgency of urination: R39.15

## 2014-02-21 HISTORY — DX: Anxiety disorder, unspecified: F41.9

## 2014-02-21 HISTORY — DX: Depression, unspecified: F32.A

## 2014-02-21 HISTORY — DX: Unspecified osteoarthritis, unspecified site: M19.90

## 2014-02-21 HISTORY — DX: Psoriasis, unspecified: L40.9

## 2014-02-21 HISTORY — DX: Sleep disorder, unspecified: G47.9

## 2014-02-21 HISTORY — DX: Family history of other specified conditions: Z84.89

## 2014-02-21 LAB — APTT: aPTT: 28 seconds (ref 24–37)

## 2014-02-21 LAB — BASIC METABOLIC PANEL
Anion gap: 13 (ref 5–15)
BUN: 26 mg/dL — ABNORMAL HIGH (ref 6–23)
CO2: 25 mEq/L (ref 19–32)
CREATININE: 0.92 mg/dL (ref 0.50–1.10)
Calcium: 9.4 mg/dL (ref 8.4–10.5)
Chloride: 104 mEq/L (ref 96–112)
GFR calc non Af Amer: 66 mL/min — ABNORMAL LOW (ref >90)
GFR, EST AFRICAN AMERICAN: 76 mL/min — AB (ref >90)
Glucose, Bld: 100 mg/dL — ABNORMAL HIGH (ref 70–99)
Potassium: 4 mEq/L (ref 3.7–5.3)
Sodium: 142 mEq/L (ref 137–147)

## 2014-02-21 LAB — URINALYSIS, ROUTINE W REFLEX MICROSCOPIC
Bilirubin Urine: NEGATIVE
Glucose, UA: NEGATIVE mg/dL
HGB URINE DIPSTICK: NEGATIVE
Ketones, ur: NEGATIVE mg/dL
Nitrite: NEGATIVE
PH: 5.5 (ref 5.0–8.0)
Protein, ur: NEGATIVE mg/dL
SPECIFIC GRAVITY, URINE: 1.02 (ref 1.005–1.030)
Urobilinogen, UA: 0.2 mg/dL (ref 0.0–1.0)

## 2014-02-21 LAB — CBC
HEMATOCRIT: 42.7 % (ref 36.0–46.0)
Hemoglobin: 13.7 g/dL (ref 12.0–15.0)
MCH: 29.4 pg (ref 26.0–34.0)
MCHC: 32.1 g/dL (ref 30.0–36.0)
MCV: 91.6 fL (ref 78.0–100.0)
Platelets: 222 10*3/uL (ref 150–400)
RBC: 4.66 MIL/uL (ref 3.87–5.11)
RDW: 13.7 % (ref 11.5–15.5)
WBC: 10.1 10*3/uL (ref 4.0–10.5)

## 2014-02-21 LAB — URINE MICROSCOPIC-ADD ON

## 2014-02-21 LAB — SURGICAL PCR SCREEN
MRSA, PCR: NEGATIVE
Staphylococcus aureus: NEGATIVE

## 2014-02-21 LAB — PROTIME-INR
INR: 1.01 (ref 0.00–1.49)
Prothrombin Time: 13.3 seconds (ref 11.6–15.2)

## 2014-02-21 LAB — ABO/RH: ABO/RH(D): A POS

## 2014-02-28 ENCOUNTER — Encounter (HOSPITAL_COMMUNITY): Payer: Self-pay | Admitting: *Deleted

## 2014-02-28 ENCOUNTER — Encounter (HOSPITAL_COMMUNITY): Payer: BC Managed Care – PPO | Admitting: Certified Registered Nurse Anesthetist

## 2014-02-28 ENCOUNTER — Inpatient Hospital Stay (HOSPITAL_COMMUNITY): Payer: BC Managed Care – PPO

## 2014-02-28 ENCOUNTER — Inpatient Hospital Stay (HOSPITAL_COMMUNITY)
Admission: RE | Admit: 2014-02-28 | Discharge: 2014-03-02 | DRG: 470 | Disposition: A | Discharge status: Home Health | Payer: BC Managed Care – PPO | Source: Ambulatory Visit | Attending: Specialist | Admitting: Specialist

## 2014-02-28 ENCOUNTER — Inpatient Hospital Stay (HOSPITAL_COMMUNITY): Payer: BC Managed Care – PPO | Admitting: Certified Registered Nurse Anesthetist

## 2014-02-28 ENCOUNTER — Encounter (HOSPITAL_COMMUNITY)
Admission: RE | Disposition: A | Discharge status: Home Health | Payer: Self-pay | Source: Ambulatory Visit | Attending: Specialist

## 2014-02-28 DIAGNOSIS — G479 Sleep disorder, unspecified: Secondary | ICD-10-CM | POA: Diagnosis present

## 2014-02-28 DIAGNOSIS — M1711 Unilateral primary osteoarthritis, right knee: Secondary | ICD-10-CM

## 2014-02-28 DIAGNOSIS — Z79899 Other long term (current) drug therapy: Secondary | ICD-10-CM

## 2014-02-28 DIAGNOSIS — Z6831 Body mass index (BMI) 31.0-31.9, adult: Secondary | ICD-10-CM | POA: Diagnosis not present

## 2014-02-28 DIAGNOSIS — M1710 Unilateral primary osteoarthritis, unspecified knee: Secondary | ICD-10-CM | POA: Diagnosis present

## 2014-02-28 DIAGNOSIS — E785 Hyperlipidemia, unspecified: Secondary | ICD-10-CM | POA: Diagnosis present

## 2014-02-28 DIAGNOSIS — Z7982 Long term (current) use of aspirin: Secondary | ICD-10-CM | POA: Diagnosis not present

## 2014-02-28 DIAGNOSIS — I10 Essential (primary) hypertension: Secondary | ICD-10-CM | POA: Diagnosis present

## 2014-02-28 DIAGNOSIS — E669 Obesity, unspecified: Secondary | ICD-10-CM | POA: Diagnosis present

## 2014-02-28 DIAGNOSIS — Z87891 Personal history of nicotine dependence: Secondary | ICD-10-CM

## 2014-02-28 DIAGNOSIS — M25569 Pain in unspecified knee: Secondary | ICD-10-CM | POA: Diagnosis present

## 2014-02-28 DIAGNOSIS — K219 Gastro-esophageal reflux disease without esophagitis: Secondary | ICD-10-CM | POA: Diagnosis present

## 2014-02-28 HISTORY — DX: Unilateral primary osteoarthritis, right knee: M17.11

## 2014-02-28 HISTORY — PX: TOTAL KNEE ARTHROPLASTY: SHX125

## 2014-02-28 LAB — TYPE AND SCREEN
ABO/RH(D): A POS
ANTIBODY SCREEN: NEGATIVE

## 2014-02-28 SURGERY — ARTHROPLASTY, KNEE, TOTAL
Anesthesia: General | Site: Knee | Laterality: Right

## 2014-02-28 MED ORDER — GLYCOPYRROLATE 0.2 MG/ML IJ SOLN
INTRAMUSCULAR | Status: DC | PRN
Start: 2014-02-28 — End: 2014-02-28
  Administered 2014-02-28: 0.6 mg via INTRAVENOUS
  Administered 2014-02-28: 0.2 mg via INTRAVENOUS

## 2014-02-28 MED ORDER — LIDOCAINE HCL (CARDIAC) 20 MG/ML IV SOLN
INTRAVENOUS | Status: AC
  Filled 2014-02-28: qty 5

## 2014-02-28 MED ORDER — PROPOFOL 10 MG/ML IV BOLUS
INTRAVENOUS | Status: AC
  Filled 2014-02-28: qty 20

## 2014-02-28 MED ORDER — ROCURONIUM BROMIDE 100 MG/10ML IV SOLN
INTRAVENOUS | Status: AC
  Filled 2014-02-28: qty 1

## 2014-02-28 MED ORDER — SODIUM CHLORIDE 0.45 % IV SOLN
INTRAVENOUS | Status: DC
  Administered 2014-02-28 – 2014-03-02 (×2): via INTRAVENOUS

## 2014-02-28 MED ORDER — CEFAZOLIN SODIUM-DEXTROSE 2-3 GM-% IV SOLR
2.0000 g | Freq: Four times a day (QID) | INTRAVENOUS | Status: AC
  Administered 2014-02-28 (×2): 2 g via INTRAVENOUS
  Filled 2014-02-28 (×2): qty 50

## 2014-02-28 MED ORDER — PROMETHAZINE HCL 25 MG/ML IJ SOLN: 6.2500 mg | INTRAMUSCULAR | Status: DC | PRN

## 2014-02-28 MED ORDER — MEPERIDINE HCL 50 MG/ML IJ SOLN: 6.2500 mg | INTRAMUSCULAR | Status: DC | PRN

## 2014-02-28 MED ORDER — PANTOPRAZOLE SODIUM 40 MG PO TBEC
40.0000 mg | DELAYED_RELEASE_TABLET | Freq: Every day | ORAL | Status: DC
  Administered 2014-02-28 – 2014-03-02 (×3): 40 mg via ORAL
  Filled 2014-02-28 (×4): qty 1

## 2014-02-28 MED ORDER — METOCLOPRAMIDE HCL 10 MG PO TABS: 5.0000 mg | ORAL_TABLET | Freq: Three times a day (TID) | ORAL | Status: DC | PRN

## 2014-02-28 MED ORDER — KETOROLAC TROMETHAMINE 30 MG/ML IJ SOLN
INTRAMUSCULAR | Status: AC
  Filled 2014-02-28: qty 1

## 2014-02-28 MED ORDER — HYDROMORPHONE HCL PF 1 MG/ML IJ SOLN
INTRAMUSCULAR | Status: DC | PRN
  Administered 2014-02-28 (×4): 0.5 mg via INTRAVENOUS

## 2014-02-28 MED ORDER — DEXAMETHASONE SODIUM PHOSPHATE 10 MG/ML IJ SOLN
INTRAMUSCULAR | Status: DC | PRN
  Administered 2014-02-28: 10 mg via INTRAVENOUS

## 2014-02-28 MED ORDER — ACETAMINOPHEN 325 MG PO TABS: 650.0000 mg | ORAL_TABLET | Freq: Four times a day (QID) | ORAL | Status: DC | PRN

## 2014-02-28 MED ORDER — RIVAROXABAN 10 MG PO TABS
10.0000 mg | ORAL_TABLET | Freq: Every day | ORAL | Status: DC
  Administered 2014-03-01 – 2014-03-02 (×2): 10 mg via ORAL
  Filled 2014-02-28 (×3): qty 1

## 2014-02-28 MED ORDER — HYDROMORPHONE HCL PF 1 MG/ML IJ SOLN
0.2500 mg | INTRAMUSCULAR | Status: DC | PRN
  Administered 2014-02-28: 0.25 mg via INTRAVENOUS
  Administered 2014-02-28: 0.5 mg via INTRAVENOUS
  Administered 2014-02-28: 0.25 mg via INTRAVENOUS
  Administered 2014-02-28: 0.5 mg via INTRAVENOUS

## 2014-02-28 MED ORDER — METHOCARBAMOL 500 MG PO TABS: 500.0000 mg | ORAL_TABLET | Freq: Three times a day (TID) | ORAL | Status: DC

## 2014-02-28 MED ORDER — POLYETHYLENE GLYCOL 3350 17 G PO PACK
17.0000 g | PACK | ORAL | Status: DC
  Administered 2014-03-01: 17 g via ORAL

## 2014-02-28 MED ORDER — EPHEDRINE SULFATE 50 MG/ML IJ SOLN
INTRAMUSCULAR | Status: AC
  Filled 2014-02-28: qty 1

## 2014-02-28 MED ORDER — GLYCOPYRROLATE 0.2 MG/ML IJ SOLN
INTRAMUSCULAR | Status: AC
  Filled 2014-02-28: qty 1

## 2014-02-28 MED ORDER — DOCUSATE SODIUM 100 MG PO CAPS
100.0000 mg | ORAL_CAPSULE | Freq: Two times a day (BID) | ORAL | Status: DC
  Administered 2014-02-28 – 2014-03-02 (×4): 100 mg via ORAL

## 2014-02-28 MED ORDER — ZOLPIDEM TARTRATE 5 MG PO TABS
5.0000 mg | ORAL_TABLET | Freq: Every evening | ORAL | Status: DC | PRN
  Administered 2014-02-28: 5 mg via ORAL
  Filled 2014-02-28: qty 1

## 2014-02-28 MED ORDER — LACTATED RINGERS IV SOLN: INTRAVENOUS | Status: DC

## 2014-02-28 MED ORDER — PHENOL 1.4 % MT LIQD: 1.0000 | OROMUCOSAL | Status: DC | PRN

## 2014-02-28 MED ORDER — SODIUM CHLORIDE 0.9 % IR SOLN
Status: DC | PRN
  Administered 2014-02-28: 1000 mL

## 2014-02-28 MED ORDER — METHOCARBAMOL 500 MG PO TABS
500.0000 mg | ORAL_TABLET | Freq: Four times a day (QID) | ORAL | Status: DC | PRN
  Administered 2014-02-28 – 2014-03-02 (×6): 500 mg via ORAL
  Filled 2014-02-28 (×6): qty 1

## 2014-02-28 MED ORDER — TRIAMTERENE-HCTZ 37.5-25 MG PO TABS
1.0000 | ORAL_TABLET | Freq: Every day | ORAL | Status: DC
  Administered 2014-02-28 – 2014-03-01 (×2): 1 via ORAL
  Filled 2014-02-28 (×3): qty 1

## 2014-02-28 MED ORDER — DEXAMETHASONE SODIUM PHOSPHATE 10 MG/ML IJ SOLN
INTRAMUSCULAR | Status: AC
  Filled 2014-02-28: qty 1

## 2014-02-28 MED ORDER — SUCCINYLCHOLINE CHLORIDE 20 MG/ML IJ SOLN
INTRAMUSCULAR | Status: DC | PRN
Start: 2014-02-28 — End: 2014-02-28
  Administered 2014-02-28: 100 mg via INTRAVENOUS

## 2014-02-28 MED ORDER — DOCUSATE SODIUM 100 MG PO CAPS: 100.0000 mg | ORAL_CAPSULE | Freq: Two times a day (BID) | ORAL | Status: DC | PRN

## 2014-02-28 MED ORDER — FENTANYL CITRATE 0.05 MG/ML IJ SOLN
INTRAMUSCULAR | Status: DC | PRN
  Administered 2014-02-28 (×5): 50 ug via INTRAVENOUS

## 2014-02-28 MED ORDER — SODIUM CHLORIDE 0.9 % IR SOLN
Status: DC | PRN
  Administered 2014-02-28: 10:00:00

## 2014-02-28 MED ORDER — RIVAROXABAN 10 MG PO TABS: 10.0000 mg | ORAL_TABLET | Freq: Every day | ORAL | Status: DC

## 2014-02-28 MED ORDER — HYDROMORPHONE HCL PF 1 MG/ML IJ SOLN
INTRAMUSCULAR | Status: AC
  Filled 2014-02-28: qty 1

## 2014-02-28 MED ORDER — OXYCODONE HCL 5 MG PO TABS
5.0000 mg | ORAL_TABLET | ORAL | Status: DC | PRN
  Administered 2014-02-28: 5 mg via ORAL
  Administered 2014-02-28 – 2014-03-02 (×9): 10 mg via ORAL
  Filled 2014-02-28: qty 2
  Filled 2014-02-28: qty 1
  Filled 2014-02-28 (×8): qty 2

## 2014-02-28 MED ORDER — ONDANSETRON HCL 4 MG/2ML IJ SOLN
INTRAMUSCULAR | Status: AC
  Filled 2014-02-28: qty 2

## 2014-02-28 MED ORDER — METHOCARBAMOL 1000 MG/10ML IJ SOLN
500.0000 mg | Freq: Four times a day (QID) | INTRAVENOUS | Status: DC | PRN
  Filled 2014-02-28: qty 5

## 2014-02-28 MED ORDER — ACETAMINOPHEN 650 MG RE SUPP: 650.0000 mg | Freq: Four times a day (QID) | RECTAL | Status: DC | PRN

## 2014-02-28 MED ORDER — ESMOLOL HCL 10 MG/ML IV SOLN
INTRAVENOUS | Status: AC
  Filled 2014-02-28: qty 10

## 2014-02-28 MED ORDER — ROCURONIUM BROMIDE 100 MG/10ML IV SOLN
INTRAVENOUS | Status: DC | PRN
  Administered 2014-02-28: 30 mg via INTRAVENOUS

## 2014-02-28 MED ORDER — ACETAMINOPHEN 10 MG/ML IV SOLN
1000.0000 mg | Freq: Four times a day (QID) | INTRAVENOUS | Status: DC
  Filled 2014-02-28 (×4): qty 100

## 2014-02-28 MED ORDER — MENTHOL 3 MG MT LOZG: 1.0000 | LOZENGE | OROMUCOSAL | Status: DC | PRN

## 2014-02-28 MED ORDER — CEFAZOLIN SODIUM-DEXTROSE 2-3 GM-% IV SOLR
2.0000 g | INTRAVENOUS | Status: AC
  Administered 2014-02-28: 2 g via INTRAVENOUS

## 2014-02-28 MED ORDER — SODIUM CHLORIDE 0.9 % IJ SOLN
INTRAMUSCULAR | Status: AC
Start: 2014-02-28 — End: 2014-02-28
  Filled 2014-02-28: qty 10

## 2014-02-28 MED ORDER — BUPIVACAINE LIPOSOME 1.3 % IJ SUSP
20.0000 mL | Freq: Once | INTRAMUSCULAR | Status: DC
  Filled 2014-02-28: qty 20

## 2014-02-28 MED ORDER — ONDANSETRON HCL 4 MG/2ML IJ SOLN: 4.0000 mg | Freq: Four times a day (QID) | INTRAMUSCULAR | Status: DC | PRN

## 2014-02-28 MED ORDER — NEOSTIGMINE METHYLSULFATE 10 MG/10ML IV SOLN
INTRAVENOUS | Status: AC
  Filled 2014-02-28: qty 1

## 2014-02-28 MED ORDER — KETOROLAC TROMETHAMINE 30 MG/ML IJ SOLN
15.0000 mg | Freq: Once | INTRAMUSCULAR | Status: AC | PRN
  Administered 2014-02-28: 30 mg via INTRAVENOUS

## 2014-02-28 MED ORDER — CEFAZOLIN SODIUM-DEXTROSE 2-3 GM-% IV SOLR
INTRAVENOUS | Status: AC
  Filled 2014-02-28: qty 50

## 2014-02-28 MED ORDER — NEOSTIGMINE METHYLSULFATE 10 MG/10ML IV SOLN
INTRAVENOUS | Status: DC | PRN
  Administered 2014-02-28: 5 mg via INTRAVENOUS

## 2014-02-28 MED ORDER — LORATADINE 10 MG PO TABS
10.0000 mg | ORAL_TABLET | Freq: Every day | ORAL | Status: DC
  Administered 2014-02-28 – 2014-03-02 (×3): 10 mg via ORAL
  Filled 2014-02-28 (×3): qty 1

## 2014-02-28 MED ORDER — ACITRETIN 25 MG PO CAPS: 25.0000 mg | ORAL_CAPSULE | ORAL | Status: DC

## 2014-02-28 MED ORDER — DIAZEPAM 5 MG PO TABS
10.0000 mg | ORAL_TABLET | Freq: Four times a day (QID) | ORAL | Status: DC | PRN
  Administered 2014-03-01: 10 mg via ORAL
  Filled 2014-02-28: qty 2

## 2014-02-28 MED ORDER — MIDAZOLAM HCL 5 MG/5ML IJ SOLN
INTRAMUSCULAR | Status: DC | PRN
  Administered 2014-02-28: 2 mg via INTRAVENOUS

## 2014-02-28 MED ORDER — PROPOFOL 10 MG/ML IV BOLUS
INTRAVENOUS | Status: DC | PRN
  Administered 2014-02-28: 150 mg via INTRAVENOUS

## 2014-02-28 MED ORDER — LACTATED RINGERS IV SOLN
INTRAVENOUS | Status: DC
  Administered 2014-02-28 (×3): via INTRAVENOUS

## 2014-02-28 MED ORDER — POLYETHYLENE GLYCOL 3350 17 GM/SCOOP PO POWD
17.0000 g | ORAL | Status: DC
  Filled 2014-02-28: qty 255

## 2014-02-28 MED ORDER — MIDAZOLAM HCL 2 MG/2ML IJ SOLN
INTRAMUSCULAR | Status: AC
  Filled 2014-02-28: qty 2

## 2014-02-28 MED ORDER — HYDROMORPHONE HCL PF 2 MG/ML IJ SOLN
INTRAMUSCULAR | Status: AC
  Filled 2014-02-28: qty 1

## 2014-02-28 MED ORDER — METOCLOPRAMIDE HCL 5 MG/ML IJ SOLN: 5.0000 mg | Freq: Three times a day (TID) | INTRAMUSCULAR | Status: DC | PRN

## 2014-02-28 MED ORDER — BUPIVACAINE LIPOSOME 1.3 % IJ SUSP
INTRAMUSCULAR | Status: DC | PRN
  Administered 2014-02-28: 20 mL

## 2014-02-28 MED ORDER — ONDANSETRON HCL 4 MG/2ML IJ SOLN
INTRAMUSCULAR | Status: DC | PRN
  Administered 2014-02-28: 4 mg via INTRAVENOUS

## 2014-02-28 MED ORDER — ONDANSETRON HCL 4 MG PO TABS: 4.0000 mg | ORAL_TABLET | Freq: Four times a day (QID) | ORAL | Status: DC | PRN

## 2014-02-28 MED ORDER — GLYCOPYRROLATE 0.2 MG/ML IJ SOLN
INTRAMUSCULAR | Status: AC
  Filled 2014-02-28: qty 3

## 2014-02-28 MED ORDER — ESMOLOL HCL 10 MG/ML IV SOLN
INTRAVENOUS | Status: DC | PRN
  Administered 2014-02-28: 20 mg via INTRAVENOUS

## 2014-02-28 MED ORDER — LIDOCAINE HCL (CARDIAC) 20 MG/ML IV SOLN
INTRAVENOUS | Status: DC | PRN
  Administered 2014-02-28: 100 mg via INTRAVENOUS

## 2014-02-28 MED ORDER — OXYCODONE-ACETAMINOPHEN 7.5-325 MG PO TABS: 1.0000 | ORAL_TABLET | ORAL | Status: DC | PRN

## 2014-02-28 MED ORDER — FENTANYL CITRATE 0.05 MG/ML IJ SOLN
INTRAMUSCULAR | Status: AC
  Filled 2014-02-28: qty 5

## 2014-02-28 MED ORDER — HYDROMORPHONE HCL PF 1 MG/ML IJ SOLN
1.0000 mg | INTRAMUSCULAR | Status: DC | PRN
  Administered 2014-02-28 – 2014-03-02 (×8): 1 mg via INTRAVENOUS
  Filled 2014-02-28 (×8): qty 1

## 2014-02-28 SURGICAL SUPPLY — 71 items
BAG SPEC THK2 15X12 ZIP CLS (MISCELLANEOUS)
BAG ZIPLOCK 12X15 (MISCELLANEOUS) IMPLANT
BANDAGE ELASTIC 4 VELCRO ST LF (GAUZE/BANDAGES/DRESSINGS) ×2 IMPLANT
BANDAGE ELASTIC 6 VELCRO ST LF (GAUZE/BANDAGES/DRESSINGS) ×2 IMPLANT
BANDAGE ESMARK 6X9 LF (GAUZE/BANDAGES/DRESSINGS) ×1 IMPLANT
BLADE SAG 18X100X1.27 (BLADE) ×2 IMPLANT
BLADE SAW SGTL 13.0X1.19X90.0M (BLADE) ×2 IMPLANT
BNDG CMPR 9X6 STRL LF SNTH (GAUZE/BANDAGES/DRESSINGS) ×1
BNDG ESMARK 6X9 LF (GAUZE/BANDAGES/DRESSINGS) ×2
CAPT RP KNEE ×1 IMPLANT
CEMENT HV SMART SET (Cement) ×4 IMPLANT
CHLORAPREP W/TINT 26ML (MISCELLANEOUS) IMPLANT
CLOTH 2% CHLOROHEXIDINE 3PK (PERSONAL CARE ITEMS) ×2 IMPLANT
CUFF TOURN SGL QUICK 34 (TOURNIQUET CUFF) ×2
CUFF TRNQT CYL 34X4X40X1 (TOURNIQUET CUFF) ×1 IMPLANT
DRAPE INCISE IOBAN 66X45 STRL (DRAPES) IMPLANT
DRAPE LG THREE QUARTER DISP (DRAPES) ×2 IMPLANT
DRAPE ORTHO SPLIT 77X108 STRL (DRAPES) ×4
DRAPE POUCH INSTRU U-SHP 10X18 (DRAPES) ×2 IMPLANT
DRAPE SURG ORHT 6 SPLT 77X108 (DRAPES) ×2 IMPLANT
DRAPE U-SHAPE 47X51 STRL (DRAPES) ×2 IMPLANT
DRSG ADAPTIC 3X8 NADH LF (GAUZE/BANDAGES/DRESSINGS) ×1 IMPLANT
DRSG AQUACEL AG ADV 3.5X10 (GAUZE/BANDAGES/DRESSINGS) ×1 IMPLANT
DRSG PAD ABDOMINAL 8X10 ST (GAUZE/BANDAGES/DRESSINGS) IMPLANT
DRSG TEGADERM 4X4.75 (GAUZE/BANDAGES/DRESSINGS) ×2 IMPLANT
DURAPREP 26ML APPLICATOR (WOUND CARE) ×2 IMPLANT
ELECT REM PT RETURN 9FT ADLT (ELECTROSURGICAL) ×2
ELECTRODE REM PT RTRN 9FT ADLT (ELECTROSURGICAL) ×1 IMPLANT
EVACUATOR 1/8 PVC DRAIN (DRAIN) ×2 IMPLANT
FACESHIELD WRAPAROUND (MASK) ×10 IMPLANT
FACESHIELD WRAPAROUND OR TEAM (MASK) ×5 IMPLANT
GAUZE SPONGE 2X2 8PLY STRL LF (GAUZE/BANDAGES/DRESSINGS) IMPLANT
GAUZE SPONGE 4X4 12PLY STRL (GAUZE/BANDAGES/DRESSINGS) IMPLANT
GLOVE BIOGEL PI IND STRL 7.5 (GLOVE) ×1 IMPLANT
GLOVE BIOGEL PI IND STRL 8 (GLOVE) ×1 IMPLANT
GLOVE BIOGEL PI INDICATOR 7.5 (GLOVE) ×1
GLOVE BIOGEL PI INDICATOR 8 (GLOVE) ×1
GLOVE SURG SS PI 7.5 STRL IVOR (GLOVE) ×2 IMPLANT
GLOVE SURG SS PI 8.0 STRL IVOR (GLOVE) ×4 IMPLANT
GOWN STRL REUS W/TWL XL LVL3 (GOWN DISPOSABLE) ×4 IMPLANT
HANDPIECE INTERPULSE COAX TIP (DISPOSABLE) ×2
IMMOBILIZER KNEE 20 (SOFTGOODS) ×2
IMMOBILIZER KNEE 20 THIGH 36 (SOFTGOODS) ×1 IMPLANT
KIT BASIN OR (CUSTOM PROCEDURE TRAY) ×2 IMPLANT
MANIFOLD NEPTUNE II (INSTRUMENTS) ×2 IMPLANT
NDL SAFETY ECLIPSE 18X1.5 (NEEDLE) ×1 IMPLANT
NEEDLE HYPO 18GX1.5 SHARP (NEEDLE) ×2
NS IRRIG 1000ML POUR BTL (IV SOLUTION) IMPLANT
PACK TOTAL JOINT (CUSTOM PROCEDURE TRAY) ×2 IMPLANT
PADDING CAST COTTON 6X4 STRL (CAST SUPPLIES) IMPLANT
POSITIONER SURGICAL ARM (MISCELLANEOUS) ×2 IMPLANT
SET HNDPC FAN SPRY TIP SCT (DISPOSABLE) ×1 IMPLANT
SPONGE GAUZE 2X2 STER 10/PKG (GAUZE/BANDAGES/DRESSINGS) ×1
SPONGE SURGIFOAM ABS GEL 100 (HEMOSTASIS) IMPLANT
STAPLER VISISTAT (STAPLE) IMPLANT
STRIP CLOSURE SKIN 1/2X4 (GAUZE/BANDAGES/DRESSINGS) ×1 IMPLANT
SUCTION FRAZIER 12FR DISP (SUCTIONS) ×2 IMPLANT
SUT BONE WAX W31G (SUTURE) IMPLANT
SUT MNCRL AB 4-0 PS2 18 (SUTURE) ×1 IMPLANT
SUT VIC AB 1 CT1 27 (SUTURE) ×2
SUT VIC AB 1 CT1 27XBRD ANTBC (SUTURE) ×1 IMPLANT
SUT VIC AB 2-0 CT1 27 (SUTURE) ×6
SUT VIC AB 2-0 CT1 TAPERPNT 27 (SUTURE) ×3 IMPLANT
SUT VLOC 180 0 24IN GS25 (SUTURE) ×2 IMPLANT
SYRINGE 20CC LL (MISCELLANEOUS) ×2 IMPLANT
TOWEL OR 17X26 10 PK STRL BLUE (TOWEL DISPOSABLE) ×2 IMPLANT
TOWEL OR NON WOVEN STRL DISP B (DISPOSABLE) IMPLANT
TOWER CARTRIDGE SMART MIX (DISPOSABLE) ×2 IMPLANT
TRAY FOLEY CATH 14FRSI W/METER (CATHETERS) ×2 IMPLANT
WATER STERILE IRR 1500ML POUR (IV SOLUTION) ×2 IMPLANT
WRAP KNEE MAXI GEL POST OP (GAUZE/BANDAGES/DRESSINGS) ×2 IMPLANT

## 2014-02-28 NOTE — Discharge Instructions (Signed)
Elevate leg above heart 6x a day for 20minutes each °Use knee immobilizer while walking until can SLR x 10 °Use knee immobilizer in bed to keep knee in extension °Daily dressing changes °Aquacel dressing may remain in place for 7 days. May shower with aquacel dressing in place. After 7 days, remove aquacel dressing. Do not remove steri-strips if they are present. Place new dressing with gauze and tape or ACE bandage which should be kept clean and dry and changed daily. °

## 2014-02-28 NOTE — H&P (View-Only) (Signed)
Jade Singh DOB: 11/12/1950 Married / Language: English / Race: White Female  H&P date: 02/13/14  Chief Complaint: R knee pain  History of Present Illness  The patient is a 63 year old female who comes in today for a preoperative history and physical. The patient is scheduled for a right total knee arthroplasty to be performed by Dr. Javier DockerJeffrey C. Beane, MD [facility] on 02/28/14. Dr. Shelle IronBeane and the patient mutually agreed to proceed with a total knee replacement. Risks and benefits of the procedure were discussed including stiffness, suboptimal range of motion, persistent pain, infection requiring removal of prosthesis and reinsertion, need for prophylactic antibiotics in the future, for example, dental procedures, possible need for manipulation, revision in the future and also anesthetic complications including DVT, PE, etc. We discussed the perioperative course, time in the hospital, postoperative recovery and the need for elevation to control swelling. We also discussed the predicted range of motion and the probability that squatting and kneeling would be unobtainable in the future. In addition, postoperative anticoagulation was discussed. We have obtained preoperative medical clearance as necessary. Provided her illustrated handout and discussed it in detail. They will enroll in the total joint replacement educational forum at the hospital.  Pt with end-stage R knee DJD, bone-on-bone, refractory to activity modifications, quad strengthening, bracing, steroid injections, viscosupplementation with ongoing pain, stiffness and instability limiting her ADL's.  She has no hx of DVT or MRSA. She is on ASA 325mg  daily.  Allergies Relafen *ANALGESICS - ANTI-INFLAMMATORY* swelling Oruvail *ANALGESICS - ANTI-INFLAMMATORY* Levofloxacin *FLUOROQUINOLONES*  Family History  Cancer brother- prostate, deceased at 4049 Heart Disease grandfather mothers side Mother Living. vascular dz;  age 62 Father Living. age 62; COPD  Social History  Tobacco use Former smoker. former smoker; smoke(d) 2 1/2 pack(s) per day Post-Surgical Plans home with HHPT; husband and mother in law to help Living situation Lives with spouse. 2 level home with bedroom on 1st floor and one step to enter Exercise Exercises never; does other Alcohol use current drinker; drinks wine; only occasionally per week Children 1 Number of flights of stairs before winded 1 Pain Contract no Drug/Alcohol Rehab (Previously) no Marital status married Current work status working full time Tobacco / smoke exposure no Drug/Alcohol Rehab (Currently) no Illicit drug use no  Medication History Percocet (5-325MG  Tablet, 1 Oral PO Q 6-8 H PRN PAIN, Taken starting 01/07/2014) Active. Naprosyn (500MG  Tablet, 1 (one) Tablet Oral one pill twice daily with food, Taken starting 09/24/2013) Active. (jcb/jmb/acw [employer] [phone]) Vitamin D (Oral) Specific dose unknown - Active. Magnesium Oxide (250MG  Tablet, Oral) Active. Aspirin (325MG  Tablet, 1 (one) Oral) Active. Pravastatin Sodium (40MG  Tablet, Oral) Active. Acitretin (25MG  Capsule, Oral) Active. Zolpidem Tartrate (5MG  Tablet, Oral) Active. Protonix (Oral) Specific dose unknown - Active. Hydrochlorothiazide (Oral) Specific dose unknown - Active. Medications Reconciled  Pregnancy / Birth History  Pregnant no  Past Surgical History  Tubal Ligation Tonsillectomy Foot Surgery left Hysterectomy partial (non-cancerous) Arthroscopy of Knee bilateral Appendectomy  Past Medical Hx Hypercholesterolemia Osteoarthritis Anxiety Disorder Chronic Cystitis Gastroesophageal Reflux Disease High blood pressure Chronic Pain Vertigo Depression Asthma Bronchitis Pneumonia Urinary Tract Infection Bursitis Streptococcus Infections Measles Mumps Rubella Eczema Menopause  Review of Systems  General Not Present-  Chills, Fatigue, Fever, Memory Loss, Night Sweats, Weight Gain and Weight Loss. Skin Present- Eczema. Not Present- Hives, Itching, Lesions and Rash. HEENT Not Present- Dentures, Double Vision, Headache, Hearing Loss, Tinnitus and Visual Loss. Respiratory Not Present- Allergies, Chronic Cough, Coughing up  blood, Shortness of breath at rest and Shortness of breath with exertion. Cardiovascular Not Present- Chest Pain, Difficulty Breathing Lying Down, Murmur, Palpitations, Racing/skipping heartbeats and Swelling. Gastrointestinal Present- Constipation. Not Present- Abdominal Pain, Bloody Stool, Diarrhea, Difficulty Swallowing, Heartburn, Jaundice, Loss of appetitie, Nausea and Vomiting. Female Genitourinary Present- Incontinence and Urinating at Night. Not Present- Blood in Urine, Discharge, Flank Pain, Painful Urination, Urgency, Urinary frequency, Urinary Retention and Weak urinary stream. Musculoskeletal Present- Back Pain, Joint Pain, Joint Swelling, Morning Stiffness and Muscle Pain. Not Present- Muscle Weakness and Spasms. Neurological Not Present- Blackout spells, Difficulty with balance, Dizziness, Paralysis, Tremor and Weakness. Psychiatric Not Present- Insomnia.  Physical Exam  General Mental Status -Alert, cooperative and good historian. General Appearance-pleasant, Not in acute distress. Orientation-Oriented X3. Build & Nutrition-Well nourished and Well developed.  Head and Neck Head-normocephalic, atraumatic . Neck Global Assessment - supple, no bruit auscultated on the right, no bruit auscultated on the left.  Eye Pupil - Bilateral-Regular and Round. Motion - Bilateral-EOMI.  Chest and Lung Exam Auscultation Breath sounds - clear at anterior chest wall and clear at posterior chest wall. Adventitious sounds - No Adventitious sounds.  Cardiovascular Auscultation Rhythm - Regular rate and rhythm. Heart Sounds - S1 WNL and S2 WNL. Murmurs & Other Heart Sounds -  Auscultation of the heart reveals - No Murmurs.  Abdomen Palpation/Percussion Tenderness - Abdomen is non-tender to palpation. Rigidity (guarding) - Abdomen is soft. Auscultation Auscultation of the abdomen reveals - Bowel sounds normal.  Female Genitourinary Note: Not done, not pertinent to present illness  Musculoskeletal Note: General Mental Status - Alert. General Appearance- pleasant and In acute distress (mild due to pain). Orientation- Oriented X3. Build & Nutrition- Well nourished and Well developed. Gait- Stiff and Antalgic (with varus thrust).  Musculoskeletal Lower Extremity Right Lower Extremity: Right Knee: Inspection and Palpation:Tenderness- LCL mid-substance tender to palpation, medial joint line tender to palpation and lateral joint line tender to palpation. no tenderness to palpation of the superior calf, no tenderness to palpation of the pes anserine bursa, no tenderness to palpation of the quadriceps tendon, no tenderness to palpation of the patellar tendon, no tenderness to palpation of the patella, no tenderness to palpation of the fibular head and no tenderness to palpation of the peroneal nerve. Patellar Tendon- no pain to palpation of the patellar tendon. Swelling- periarticular swelling present. Effusion- trace. Tissue tension/texture is - soft. Crepitus- moderate patellofemoral crepitus. Pulses- 2+. Sensation- intact to light touch. Skin- Color- no ecchymosis and no erythema. Strength and Tone:Quadriceps- 5/5. Hamstrings- 5/5. ROM: Flexion:AROM- 120 . Extension:AROM- 0 . Stability- Valgus Laxity at 30- None. Valgus Laxity at 0- None. Varus Laxity at 30- None. Varus Laxity at 0- None. Lachman- Negative. Anterior Drawer Test - Negative. Posterior Drawer Test- Negative. Deformities/Malalignments/Discrepancies- no deformities noted. Special Tests:McMurray Test (lateral) - negative. McMurray Test (medial)- negative.  Patellar Compression Pain- mild pain.  Imaging prior standing xrays reviewed with no fx, subluxation, dislocation, lytic or blastic lesions. There are severe end-stage degenerative changes right knee, bone-on-bone in both the medial and PF compartment. Varus alignment.  Assessment & Plan Primary osteoarthritis of right knee  Pt with R knee end-stage DJD, bone-on-bone, refractory to conservative tx, scheduled for R total knee replacement by Dr. Shelle Iron on 02/28/2014. We again discussed the procedure itself as well as risks, complications and alternatives, including but not limited to DVT, PE, infx, bleeding, failure of procedure, need for secondary procedure including manipulation, nerve injury, ongoing pain/symptoms, anesthesia risk, even stroke or death.  Also discussed typical post-op protocols, activity restrictions, need for PT, flexion/extension exercises, time out of work. Discussed need for DVT ppx post-op with Xarelto then ASA per protocol. Discussed dental ppx. Also discussed limitations post-operatively such as kneeling and squatting. All questions were answered. Patient desires to proceed with surgery as scheduled. Will hold ASA and NSAIDs accordingly. Will remain NPO after MN night before surgery. Will present to [facility] for pre-op testing. Plan Xarelto 2 weeks post-op for DVT ppx then ASA. Plan Percocet 7.5 or 10mg  post-op for pain, Robaxin, Colace. Will follow up 10-14 days post-op for suture removal and xrays.  {;lan R total knee replacement  Signed electronically by Andrez Grime, PA-C for Dr. Shelle Iron

## 2014-02-28 NOTE — Evaluation (Signed)
Physical Therapy Evaluation Patient Details Name: Jade PitchJacqueline J Kates MRN: [id number] DOB: 10/27/1950 Today's Date: 02/28/2014   History of Present Illness  R TKA 02/28/14  Clinical Impression  Pt ambulated x 25', tolerated well. Pt will benefit from PT to address problems listed in note.    Follow Up Recommendations Home health PT;Supervision/Assistance - 24 hour    Equipment Recommendations  Rolling walker with 5" wheels    Recommendations for Other Services       Precautions / Restrictions Precautions Precautions: Knee Required Braces or Orthoses: Knee Immobilizer - Right Knee Immobilizer - Right: Discontinue once straight leg raise with < 10 degree lag      Mobility  Bed Mobility Overal bed mobility: Needs Assistance Bed Mobility: Supine to Sit     Supine to sit: Min assist     General bed mobility comments: assist with R leg to lower to bed.  Transfers Overall transfer level: Needs assistance Equipment used: Rolling walker (2 wheeled) Transfers: Sit to/from Stand Sit to Stand: Min assist         General transfer comment: cues for technique and for hand and R leg placement.  Ambulation/Gait Ambulation/Gait assistance: Min assist Ambulation Distance (Feet): 25 Feet Assistive device: Rolling walker (2 wheeled) Gait Pattern/deviations: Step-to pattern;Antalgic;Decreased step length - right;Decreased stance time - right     General Gait Details: cues for weight as tolerated, sequence and posture  Stairs            Wheelchair Mobility    Modified Rankin (Stroke Patients Only)       Balance                                             Pertinent Vitals/Pain Pain Assessment: 0-10 Pain Score: 3  Pain Location: medial R knee Pain Descriptors / Indicators: Aching;Sharp Pain Intervention(s): Monitored during session;Ice applied;Repositioned;Premedicated before session    Home Living Family/patient expects to be discharged to::  Private residence Living Arrangements: Spouse/significant other Available Help at Discharge: Family Type of Home: House Home Access: Stairs to enter Entrance Stairs-Rails: None Secretary/administratorntrance Stairs-Number of Steps: 1 Home Layout: One level Home Equipment: None Additional Comments: has a borrowed walker but wants toget her own    Prior Function Level of Independence: Independent               Hand Dominance        Extremity/Trunk Assessment   Upper Extremity Assessment: Overall WFL for tasks assessed           Lower Extremity Assessment: RLE deficits/detail RLE Deficits / Details: SLR with min assist,        Communication   Communication: No difficulties  Cognition Arousal/Alertness: Awake/alert Behavior During Therapy: WFL for tasks assessed/performed Overall Cognitive Status: Within Functional Limits for tasks assessed                      General Comments      Exercises Total Joint Exercises Ankle Circles/Pumps: AROM;Both;Supine Quad Sets: AROM;Right;5 reps;Supine Straight Leg Raises: AAROM;Right;5 reps;Supine      Assessment/Plan    PT Assessment Patient needs continued PT services  PT Diagnosis Acute pain;Difficulty walking   PT Problem List Decreased strength;Decreased range of motion;Decreased activity tolerance;Decreased knowledge of precautions;Decreased mobility;Decreased safety awareness;Decreased knowledge of use of DME;Pain  PT Treatment Interventions DME instruction;Gait training;Stair training;Functional mobility training;Therapeutic  activities;Therapeutic exercise;Patient/family education   PT Goals (Current goals can be found in the Care Plan section) Acute Rehab PT Goals Patient Stated Goal: I want to walk PT Goal Formulation: With patient/family Time For Goal Achievement: 03/07/14 Potential to Achieve Goals: Good    Frequency 7X/week   Barriers to discharge        Co-evaluation               End of Session  Equipment Utilized During Treatment: Right knee immobilizer Activity Tolerance: Patient tolerated treatment well Patient left: in chair;with call bell/phone within reach;with family/visitor present Nurse Communication: Mobility status         Time: 1884-1660 PT Time Calculation (min): 16 min   Charges:   PT Evaluation $Initial PT Evaluation Tier I: 1 Procedure PT Treatments $Gait Training: 8-22 mins   PT G Codes:          Rada Hay 02/28/2014, 4:50 PM

## 2014-02-28 NOTE — Interval H&P Note (Signed)
History and Physical Interval Note:  02/28/2014 7:24 AM  Jade Singh  has presented today for surgery, with the diagnosis of RIGHT KNEE DJD  The various methods of treatment have been discussed with the patient and family. After consideration of risks, benefits and other options for treatment, the patient has consented to  Procedure(s): RIGHT TOTAL KNEE ARTHROPLASTY (Right) as a surgical intervention .  The patient's history has been reviewed, patient examined, no change in status, stable for surgery.  I have reviewed the patient's chart and labs.  Questions were answered to the patient's satisfaction.     Murel Wigle C

## 2014-02-28 NOTE — Transfer of Care (Signed)
Immediate Anesthesia Transfer of Care Note  Patient: Jade Singh  Procedure(s) Performed: Procedure(s) (LRB): RIGHT TOTAL KNEE ARTHROPLASTY (Right)  Patient Location: PACU  Anesthesia Type: General  Level of Consciousness: sedated, patient cooperative and responds to stimulation  Airway & Oxygen Therapy: Patient Spontanous Breathing and Patient connected to face mask oxgen  Post-op Assessment: Report given to PACU RN and Post -op Vital signs reviewed and stable  Post vital signs: Reviewed and stable  Complications: No apparent anesthesia complications

## 2014-02-28 NOTE — Anesthesia Preprocedure Evaluation (Addendum)
Anesthesia Evaluation  Patient identified by MRN, date of birth, ID band Patient awake    Reviewed: Allergy & Precautions, H&P , NPO status , Patient's Chart, lab work & pertinent test results  Airway Mallampati: II TM Distance: >3 FB Neck ROM: Full    Dental no notable dental hx.    Pulmonary neg pulmonary ROS, former smoker,  breath sounds clear to auscultation  Pulmonary exam normal       Cardiovascular hypertension, Pt. on medications Rhythm:Regular Rate:Normal     Neuro/Psych negative neurological ROS  negative psych ROS   GI/Hepatic negative GI ROS, Neg liver ROS, GERD-  Medicated and Controlled,  Endo/Other  negative endocrine ROS  Renal/GU negative Renal ROS  negative genitourinary   Musculoskeletal negative musculoskeletal ROS (+)   Abdominal   Peds negative pediatric ROS (+)  Hematology negative hematology ROS (+)   Anesthesia Other Findings   Reproductive/Obstetrics negative OB ROS                          Anesthesia Physical Anesthesia Plan  ASA: II  Anesthesia Plan: General   Post-op Pain Management:    Induction: Intravenous  Airway Management Planned: Oral ETT  Additional Equipment:   Intra-op Plan:   Post-operative Plan: Extubation in OR  Informed Consent: I have reviewed the patients History and Physical, chart, labs and discussed the procedure including the risks, benefits and alternatives for the proposed anesthesia with the patient or authorized representative who has indicated his/her understanding and acceptance.   Dental advisory given  Plan Discussed with: CRNA  Anesthesia Plan Comments:         Anesthesia Quick Evaluation

## 2014-02-28 NOTE — OR Nursing (Signed)
Adjusted turniquet dates.

## 2014-02-28 NOTE — Anesthesia Postprocedure Evaluation (Signed)
  Anesthesia Post-op Note  Patient: Jade Singh  Procedure(s) Performed: Procedure(s) (LRB): RIGHT TOTAL KNEE ARTHROPLASTY (Right)  Patient Location: PACU  Anesthesia Type: General  Level of Consciousness: awake and alert   Airway and Oxygen Therapy: Patient Spontanous Breathing  Post-op Pain: mild  Post-op Assessment: Post-op Vital signs reviewed, Patient's Cardiovascular Status Stable, Respiratory Function Stable, Patent Airway and No signs of Nausea or vomiting  Last Vitals:  Filed Vitals:   02/28/14 1330  BP: 136/65  Pulse: 63  Temp: 36.3 C  Resp: 14    Post-op Vital Signs: stable   Complications: No apparent anesthesia complications

## 2014-02-28 NOTE — Brief Op Note (Signed)
02/28/2014  10:35 AM  PATIENT:  Jade Singh  63 y.o. female  PRE-OPERATIVE DIAGNOSIS:  RIGHT KNEE Degenerative Joint Disease  POST-OPERATIVE DIAGNOSIS:  RIGHT KNEE Degenerative Joint Disease  PROCEDURE:  Procedure(s): RIGHT TOTAL KNEE ARTHROPLASTY (Right)  SURGEON:  Surgeon(s) and Role:    * Javier DockerJeffrey C Ovid Witman, MD - Primary  PHYSICIAN ASSISTANT:   ASSISTANTS: Bissell   ANESTHESIA:   general  EBL:  Total I/O In: 2300 [I.V.:2300] Out: 200 [Urine:100; Blood:100]  BLOOD ADMINISTERED:none  DRAINS: none   LOCAL MEDICATIONS USED:  MARCAINE     SPECIMEN:  No Specimen  DISPOSITION OF SPECIMEN:  N/A  COUNTS:  YES  TOURNIQUET:   Total Tourniquet Time Documented: Thigh (Right) - 4374 minutes Thigh (Right) - -33066 minutes Total: Thigh (Right) - -69629-28692 minutes  1 hour and 40 min TT  DICTATION: .Other Dictation: Dictation Number [id number]  PLAN OF CARE: Admit to inpatient   PATIENT DISPOSITION:  PACU - hemodynamically stable.   Delay start of Pharmacological VTE agent (>24hrs) due to surgical blood loss or risk of bleeding: no

## 2014-03-01 ENCOUNTER — Other Ambulatory Visit: Payer: Self-pay | Admitting: Cardiovascular Disease

## 2014-03-01 ENCOUNTER — Encounter (HOSPITAL_COMMUNITY): Payer: Self-pay | Admitting: Specialist

## 2014-03-01 LAB — CBC
HCT: 35.6 % — ABNORMAL LOW (ref 36.0–46.0)
HEMOGLOBIN: 11.4 g/dL — AB (ref 12.0–15.0)
MCH: 29.1 pg (ref 26.0–34.0)
MCHC: 32 g/dL (ref 30.0–36.0)
MCV: 90.8 fL (ref 78.0–100.0)
PLATELETS: 192 10*3/uL (ref 150–400)
RBC: 3.92 MIL/uL (ref 3.87–5.11)
RDW: 13.5 % (ref 11.5–15.5)
WBC: 14.7 10*3/uL — AB (ref 4.0–10.5)

## 2014-03-01 LAB — BASIC METABOLIC PANEL
ANION GAP: 10 (ref 5–15)
BUN: 15 mg/dL (ref 6–23)
CALCIUM: 9 mg/dL (ref 8.4–10.5)
CO2: 29 mEq/L (ref 19–32)
Chloride: 103 mEq/L (ref 96–112)
Creatinine, Ser: 0.8 mg/dL (ref 0.50–1.10)
GFR calc Af Amer: >90 mL/min (ref >90)
GFR calc non Af Amer: 78 mL/min — ABNORMAL LOW (ref >90)
Glucose, Bld: 125 mg/dL — ABNORMAL HIGH (ref 70–99)
Potassium: 4 mEq/L (ref 3.7–5.3)
SODIUM: 142 meq/L (ref 137–147)

## 2014-03-01 NOTE — Telephone Encounter (Signed)
Rx was sent to pharmacy electronically. 

## 2014-03-01 NOTE — Progress Notes (Signed)
Patient ID: Jade Singh, female   DOB: 06/05/1951, 63 y.o.   MRN: [id number] Subjective: 1 Day Post-Op Procedure(s) (LRB): RIGHT TOTAL KNEE ARTHROPLASTY (Right) Patient reports pain as moderate.    Patient has complaints of R knee pain, well controlled  We will start therapy today. Plan is to go home with HHPT after hospital stay.  Objective: Vital signs in last 24 hours: Temp:  [97.2 F (36.2 C)-98.1 F (36.7 C)] 98.1 F (36.7 C) (08/14 0618) Pulse Rate:  [52-85] 61 (08/14 0618) Resp:  [11-24] 16 (08/14 0618) BP: (119-176)/(54-83) 128/83 mmHg (08/14 0618) SpO2:  [98 %-100 %] 100 % (08/14 0618)  Intake/Output from previous day:  Intake/Output Summary (Last 24 hours) at 03/01/14 0749 Last data filed at 03/01/14 0618  Gross per 24 hour  Intake 4141.66 ml  Output   3728 ml  Net 413.66 ml    Intake/Output this shift:    Labs: Results for orders placed during the hospital encounter of 02/28/14  CBC      Result Value Ref Range   WBC 14.7 (*) 4.0 - 10.5 K/uL   RBC 3.92  3.87 - 5.11 MIL/uL   Hemoglobin 11.4 (*) 12.0 - 15.0 g/dL   HCT 78.2 (*) 95.6 - 21.3 %   MCV 90.8  78.0 - 100.0 fL   MCH 29.1  26.0 - 34.0 pg   MCHC 32.0  30.0 - 36.0 g/dL   RDW 08.6  57.8 - 46.9 %   Platelets 192  150 - 400 K/uL  BASIC METABOLIC PANEL      Result Value Ref Range   Sodium 142  137 - 147 mEq/L   Potassium 4.0  3.7 - 5.3 mEq/L   Chloride 103  96 - 112 mEq/L   CO2 29  19 - 32 mEq/L   Glucose, Bld 125 (*) 70 - 99 mg/dL   BUN 15  6 - 23 mg/dL   Creatinine, Ser 6.29  0.50 - 1.10 mg/dL   Calcium 9.0  8.4 - 52.8 mg/dL   GFR calc non Af Amer 78 (*) >90 mL/min   GFR calc Af Amer >90  >90 mL/min   Anion gap 10  5 - 15    Exam - Neurologically intact ABD soft Neurovascular intact Sensation intact distally Intact pulses distally Dorsiflexion/Plantar flexion intact Incision: dressing C/D/I and no drainage No cellulitis present Compartment soft no calf pain or sign of DVT Dressing -  clean, dry, no drainage Motor function intact - moving foot and toes well on exam.  Hemovac pulled without difficulty tip intact   Assessment/Plan: 1 Day Post-Op Procedure(s) (LRB): RIGHT TOTAL KNEE ARTHROPLASTY (Right)  Advance diet Up with therapy D/C IV fluids Past Medical History  Diagnosis Date  . Hypertension   . Hyperlipidemia   . GERD (gastroesophageal reflux disease)   . Unspecified vitamin D deficiency   . Obese   . Family history of anesthesia complication     "mother quit breathing" 30 yrs ago pt does not know any more details. Pt has never had any problems with anesthesia herself  . DJD (degenerative joint disease)   . Psoriasis     HANDS  . Urgency of urination   . Difficulty sleeping     takes Ambien  . Depression   . Anxiety     DVT Prophylaxis - Xarelto Protocol Weight-Bearing as tolerated to Right leg Keep foley until tomorrow. No vaccines. Plan D/C home with HHPT when ready Will discuss with Dr. Shelle Iron  CPM held yesterday- will discuss with Dr. Elissa LovettBeane   BISSELL, Dayna BarkerJACLYN M. 03/01/2014, 7:49 AM

## 2014-03-01 NOTE — Progress Notes (Signed)
CARE MANAGEMENT NOTE 03/01/2014  Patient:  Jade PitchBEST,Jade Singh   Account Number:  [id number]  Date Initiated:  03/01/2014  Documentation initiated by:  Ryenne Lynam  Subjective/Objective Assessment:   rt total knee     Action/Plan:   home when stable   Anticipated DC Date:  03/03/2014   Anticipated DC Plan:  HOME W HOME HEALTH SERVICES  In-house referral  Clinical Social Worker      DC Planning Services  CM consult      [facility]  NA   Choice offered to / List presented to:  C-1 Patient   DME arranged  [facility]stW[facility] - [facility]      DME agency  [facility]     HH arranged  HH-2 PT      [facility]   Status of service:  In process, will continue to follow Medicare Important Message given?  NA - LOS <3 / Initial given by admissions (If response is "NO", the following Medicare IM given date fields will be blank) Date Medicare IM given:   Medicare IM given by:   Date Additional Medicare IM given:   Additional Medicare IM given by:    Discharge Disposition:    Per UR Regulation:  Reviewed for med. necessity/level of care/duration of stay  If discussed at Long Length of Stay Meetings, dates discussed:    Comments:  Jade LoserRhonda Omare Bilotta,RN,BSN,CCM

## 2014-03-01 NOTE — Progress Notes (Signed)
Clinical Social Work  CSW received referral to assist with SNF placement. PT recommends HH and chart review states [facility] CSW is signing off but available if needed.  GriffinHolly Tamme Mozingo, [place] [phone]

## 2014-03-01 NOTE — Evaluation (Signed)
Occupational Therapy Evaluation Patient Details Name: Jade PitchJacqueline J Femia MRN: [id number] DOB: 06/17/1951 Today's Date: 03/01/2014    History of Present Illness R TKA 02/28/14   Clinical Impression   Pt was admitted for the above surgery.  She had tried to use commode prior to OT's arrival and had moderate pain. Worked through and educated on ADLs.  Will continue to follow pt in acute with focus on toilet and shower transfers.  Pt was independent prior to admission.    Follow Up Recommendations  No OT follow up    Equipment Recommendations   (has 3;1)    Recommendations for Other Services       Precautions / Restrictions Precautions Precautions: Knee Required Braces or Orthoses: Knee Immobilizer - Right Knee Immobilizer - Right: Discontinue once straight leg raise with < 10 degree lag Restrictions Weight Bearing Restrictions: No      Mobility Bed Mobility         Supine to sit: Min assist     General bed mobility comments: assist with RLE  Transfers   Equipment used: Rolling walker (2 wheeled) Transfers: Sit to/from Stand Sit to Stand: Min assist         General transfer comment: cues for RLE placement    Balance                                            ADL Overall ADL's : Needs assistance/impaired     Grooming: Set up;Sitting   Upper Body Bathing: Supervision/ safety;Sitting   Lower Body Bathing: Minimal assistance;Sit to/from stand   Upper Body Dressing : Minimal assistance;Sitting (iv)   Lower Body Dressing: Moderate assistance;Sit to/from stand                 General ADL Comments: pt completed most of bath at bed level.  She will have 2 weeks of 24/7 assistance.  She may be able to borrow a reacher for adls.  Has borrowed a 3:1--explained use over commode as well as in shower stall.       Vision                     Perception     Praxis      Pertinent Vitals/Pain Pain Assessment: 0-10 Pain Score: 5   Pain Location: R knee Pain Descriptors / Indicators: Aching Pain Intervention(s): Repositioned;Ice applied     Hand Dominance     Extremity/Trunk Assessment Upper Extremity Assessment Upper Extremity Assessment: Overall WFL for tasks assessed           Communication Communication Communication: No difficulties   Cognition Arousal/Alertness: Awake/alert Behavior During Therapy: WFL for tasks assessed/performed Overall Cognitive Status: Within Functional Limits for tasks assessed                     General Comments       Exercises       Shoulder Instructions      Home Living Family/patient expects to be discharged to:: Private residence Living Arrangements: Spouse/significant other Available Help at Discharge: Family Type of Home: House Home Access: Stairs to enter Secretary/administratorntrance Stairs-Number of Steps: 1 Entrance Stairs-Rails: None Home Layout: One level     Bathroom Shower/Tub: Producer, television/film/videoWalk-in shower   Bathroom Toilet: Standard         Additional Comments: has borrowed a 3:1 commode  Prior Functioning/Environment Level of Independence: Independent             OT Diagnosis: Generalized weakness   OT Problem List: Decreased strength;Decreased activity tolerance;Decreased knowledge of use of DME or AE;Pain   OT Treatment/Interventions: Self-care/ADL training;DME and/or AE instruction;Patient/family education    OT Goals(Current goals can be found in the care plan section) Acute Rehab OT Goals Patient Stated Goal: get back to being independent OT Goal Formulation: With patient Time For Goal Achievement: 03/08/14 Potential to Achieve Goals: Good ADL Goals Pt Will Transfer to Toilet: with supervision;ambulating;bedside commode Pt Will Perform Toileting - Clothing Manipulation and hygiene: with supervision;sit to/from stand Pt Will Perform Tub/Shower Transfer: with min guard assist;Shower transfer;3 in 1;ambulating  OT Frequency: Min 2X/week    Barriers to D/C:            Co-evaluation              End of Session    Activity Tolerance: Patient limited by pain Patient left: in bed;with call bell/phone within reach   Time: 4098-1191 OT Time Calculation (min): 20 min Charges:  OT General Charges $OT Visit: 1 Procedure OT Evaluation $Initial OT Evaluation Tier I: 1 Procedure OT Treatments $Self Care/Home Management : 8-22 mins G-Codes:    Alyzah Pelly 2014/03/20, 10:50 AM  Marica Otter, OTR/L [phone] 03/20/2014

## 2014-03-01 NOTE — Op Note (Signed)
NAMECHARMAINE, PLACIDO NO.:  [id number]  MEDICAL RECORD NO.:  [id number]  LOCATION:  1608                         FACILITY:  [facility]  PHYSICIAN:  Jene Every, M.D.    DATE OF BIRTH:  August 06, 1950  DATE OF PROCEDURE: DATE OF DISCHARGE:                              OPERATIVE REPORT   PREOPERATIVE DIAGNOSIS:  DJD, end-stage osteoarthrosis of the right knee.  POSTOPERATIVE DIAGNOSIS:  DJD, end-stage osteoarthrosis of the right knee.  PROCEDURE PERFORMED:  Right total knee arthroplasty.  COMPONENTS:  DePuy rotating platform, 3 femur, two 5 tibia, 10 mm insert, 35 patella.  ASSISTANT:  Lanna Poche, PA.  ANESTHESIA:  General.  HISTORY:  This is a 63 year old with end-stage osteoarthrosis, medial compartment patellofemoral joint indicated for placement of the degenerated joint, failing conservative treatment.  Risks and benefits were discussed including bleeding, infection, damage to neurovascular structure, DVT, PE, anesthetic complications, etc.  Suboptimal range of motion and need for manipulation.  TECHNIQUE:  The patient was placed in supine position, after induction of adequate general anesthesia, 2 g Kefzol, right lower extremity was prepped and draped and exsanguinated in usual sterile fashion.  Thigh tourniquet inflated to 300 mmHg.  Midline incision was made over the patella.  Full-thickness flaps were developed.  Median parapatellar arthrotomy performed.  Flexion contracture was noted.  Everted the patella, flexed the knee, tricompartmental osteoarthrosis particularly medial compartment bone-on-bone in the patellofemoral joint.  Remnants of medial and lateral meniscus in the ACL were removed.  I elevated soft tissues medially preserving the MCL attachment.  Partial synovectomy performed as well.  The knee was then flexed using a Leksell rongeur to the localize the notch to drill through the anterior medullary canal parallel with the femur.   Irrigated it using T-handle reamer and then a 5-degree right intramedullary guide with 10 off the distal femurs pinned.  Using oscillating saw, we performed our cuts.  Next, we sized the distal femur of the anterior cortex of the femur to a 3, it was pinned in 3 degrees of external rotation.  We made our 3 degrees of external rotation.  We placed a distal block cutting guide and used a curved Crego specially protecting neurovascular structures at all times. We made our anterior, posterior, and chamfer cuts.  We turned our attention towards the tibia.  It was subluxed.  Mikhails were used and gently placed to protect the structures laterally and posteriorly. External alignment guide 2 off the defect, which was medial.  Bisecting the ankle and external alignment guide utilized.  Appropriate slope parallel to the tibial shaft.  It was pinned and then cut performed. Prior to performing the chamfer, we checked our flexion-extension gaps and they were equivalent.  It was slightly tight in extension, we then removed 2 mm of additional off the distal femur and chamfer cuts.  Then turned our attention towards completing the tibia, it was subluxed. Maximized to a 2.5 just to the medial aspect of the tubercle and essentially drilled and punch guide utilized for the fin, turned attention towards completing the femur.  Mediolateral of the site overhanging.  This was done laterally bisecting the canal pin and the notch was then placed again  protecting the posterior soft tissues at all times.  This was rasped.  She used a trial femur and tibia and a 10 mm insert.  Full extension, full flexion, good stability, varus and valgus stressing 0-30 degrees.  Measured the patella, measured to a 21, planed it to the smallest, which was 7.5 utilizing a patellar Jig.  They measured it, it was 13.5 mm in thickness, measured to a 35, drilled our PEG holes medializing them including the patella.  I trailed the  patella and we had excellent patellofemoral tracking.  Removed all instrumentation.  There was abrasion in the posterior capsule laterally. The popliteus was intact.  It reduced the tourniquet.  There was no active bleeding noted posteriorly and palpated that.  It was just a area into the muscle.  No other defects, just I reached to the capsule only. Then, reinflated the tourniquet after exsanguination.  We copiously irrigated the wound with pulsatile lavage not posteriorly.  Flexed the knee, all surfaces dried thoroughly, mixed cement on back table in appropriate fashion.  Injected into the tibial canal under pressure. Then impacted the tibial tray on the tibia.  Redundant cement removed. Cemented the femur, redundant cement removed.  We placed a trial 10 insert and held it in extension axial load throughout the curing of the cement, cemented the patella.  After curing of the cement, we had full extension and full flexion.  Good stability, varus and valgus stressing 0-30 degrees.  We removed the insert and meticulously removed all redundant cement.  We copiously irrigated the wound.  Subluxed the tibia.  I put a 10 mm rotating platform, permanent insert, reduced it, and again full extension, full flexion, good stability, varus and valgus stressing 0-30 degrees.  Excellent patellofemoral tracking.  Next, we used Exparel and paraarticular musculature and tendon.  Hemovac was placed and brought out through a lateral stab wound in the skin.  Gentle flexion, we reapproximated the arthrotomy with 1 Vicryl and then running V-Loc treated with excellent closure.  Good patellofemoral tracking. Full flexion and full extension, and copious irrigation, subcu with 2-0 and skin with a subcuticular.  Reinforced Steri-Strips.  Sterile dressing applied.  Good flexion to gravity 90 degrees and excellent stability, excellent patellofemoral tracking.  Sterile dressing was applied.  Tourniquet was released.   There was adequate revascularization of lower extremity appreciated.  The patient tolerated the procedure well.  No complications.  Tourniquet time was 1 hour and 40 minutes.     Jene EveryJeffrey Dicie Edelen, M.D.     Cordelia PenJB/MEDQ  D:  02/28/2014  T:  02/28/2014  Job:  [id number]

## 2014-03-01 NOTE — Progress Notes (Signed)
Physical Therapy Treatment Patient Details Name: Jade Singh MRN: [id number] DOB: 09/06/1950 Today's Date: 03/01/2014    History of Present Illness R TKA 02/28/14    PT Comments    Pt c/o increased pain today with knee flexion exercises, limited flexion achieved.   Follow Up Recommendations  Home health PT;Supervision/Assistance - 24 hour     Equipment Recommendations  Rolling walker with 5" wheels    Recommendations for Other Services       Precautions / Restrictions Precautions Precautions: Knee Required Braces or Orthoses: Knee Immobilizer - Right Knee Immobilizer - Right: Discontinue once straight leg raise with < 10 degree lag Restrictions Weight Bearing Restrictions: No    Mobility  Bed Mobility Overal bed mobility: Needs Assistance Bed Mobility: Supine to Sit;Sit to Supine     Supine to sit: Min assist Sit to supine: Mod assist   General bed mobility comments: assist with RLE  Transfers Overall transfer level: Needs assistance Equipment used: Rolling walker (2 wheeled) Transfers: Sit to/from Stand Sit to Stand: Min assist         General transfer comment: cues for RLE placement  Ambulation/Gait Ambulation/Gait assistance: Min assist Ambulation Distance (Feet): 50 Feet Assistive device: Rolling walker (2 wheeled) Gait Pattern/deviations: Step-to pattern;Step-through pattern;Decreased step length - right;Decreased stance time - right;Antalgic     General Gait Details: cues for weight as tolerated, sequence and posture   Stairs            Wheelchair Mobility    Modified Rankin (Stroke Patients Only)       Balance                                    Cognition Arousal/Alertness: Awake/alert Behavior During Therapy:  (tearful due to pain) Overall Cognitive Status: Within Functional Limits for tasks assessed                      Exercises Total Joint Exercises Ankle Circles/Pumps: AROM;Both;Supine Quad  Sets: AROM;Right;5 reps;Supine Heel Slides: AAROM;Right;5 reps Goniometric ROM: 5-30    General Comments        Pertinent Vitals/Pain Pain Assessment: 0-10 Pain Score: 8  Pain Location: R knee Pain Descriptors / Indicators: Sharp;Shooting Pain Intervention(s): Monitored during session;Premedicated before session;Ice applied    Home Living Family/patient expects to be discharged to:: Private residence Living Arrangements: Spouse/significant other Available Help at Discharge: Family Type of Home: House Home Access: Stairs to enter Entrance Stairs-Rails: None Home Layout: One level   Additional Comments: has borrowed a 3:1 commode    Prior Function Level of Independence: Independent          PT Goals (current goals can now be found in the care plan section) Acute Rehab PT Goals Patient Stated Goal: get back to being independent Progress towards PT goals: Progressing toward goals    Frequency  7X/week    PT Plan Current plan remains appropriate    Co-evaluation             End of Session Equipment Utilized During Treatment: Right knee immobilizer Activity Tolerance: Patient limited by pain Patient left: in bed;with call bell/phone within reach;with family/visitor present     Time: 1205-1235 PT Time Calculation (min): 30 min  Charges:  $Gait Training: 8-22 mins $Therapeutic Exercise: 8-22 mins  G Codes:      Rada Hay 03/01/2014, 1:33 PM

## 2014-03-02 LAB — CBC
HCT: 34.8 % — ABNORMAL LOW (ref 36.0–46.0)
HEMOGLOBIN: 11.2 g/dL — AB (ref 12.0–15.0)
MCH: 29 pg (ref 26.0–34.0)
MCHC: 32.2 g/dL (ref 30.0–36.0)
MCV: 90.2 fL (ref 78.0–100.0)
Platelets: 181 10*3/uL (ref 150–400)
RBC: 3.86 MIL/uL — ABNORMAL LOW (ref 3.87–5.11)
RDW: 13.5 % (ref 11.5–15.5)
WBC: 12.7 10*3/uL — ABNORMAL HIGH (ref 4.0–10.5)

## 2014-03-02 NOTE — Plan of Care (Signed)
Problem: Discharge Progression Outcomes Goal: Anticoagulant follow-up in place Outcome: Not Applicable Date Met:  03/02/14 xarelto     

## 2014-03-02 NOTE — Progress Notes (Signed)
Discharged from floor via w/c, family with pt. No changes in assessment. Jade Singh  

## 2014-03-02 NOTE — Progress Notes (Signed)
03/02/2014 1100 Spoke to pt and has RW and 3n1 at home. [facility] Isidoro DonningAlesia Daylen Hack RN CCM Case Mgmt phone [phone]

## 2014-03-02 NOTE — Discharge Summary (Signed)
Physician Discharge Summary   Patient ID: Jade OLLIS MRN: [id number] DOB/AGE: [birth year] 63 y.o.  Admit date: 02/28/2014 Discharge date: 03/02/2014  Primary Diagnosis: right knee DJD  Admission Diagnoses:  Past Medical History  Diagnosis Date  . Hypertension   . Hyperlipidemia   . GERD (gastroesophageal reflux disease)   . Unspecified vitamin D deficiency   . Obese   . Family history of anesthesia complication     "mother quit breathing" 30 yrs ago pt does not know any more details. Pt has never had any problems with anesthesia herself  . DJD (degenerative joint disease)   . Psoriasis     HANDS  . Urgency of urination   . Difficulty sleeping     takes Ambien  . Depression   . Anxiety    Discharge Diagnoses:   Active Problems:   Right knee DJD  Estimated body mass index is 31.29 kg/(m^2) as calculated from the following:   Height as of this encounter: 5' 5.5" (1.664 m).   Weight as of this encounter: 86.637 kg (191 lb).  Procedure:  Procedure(s) (LRB): RIGHT TOTAL KNEE ARTHROPLASTY (Right)   Consults: None  HPI: see H&P Laboratory Data: Admission on 02/28/2014, Discharged on 03/02/2014  Component Date Value Ref Range Status  . WBC 03/01/2014 14.7* 4.0 - 10.5 K/uL Final  . RBC 03/01/2014 3.92  3.87 - 5.11 MIL/uL Final  . Hemoglobin 03/01/2014 11.4* 12.0 - 15.0 g/dL Final  . HCT 03/01/2014 35.6* 36.0 - 46.0 % Final  . MCV 03/01/2014 90.8  78.0 - 100.0 fL Final  . MCH 03/01/2014 29.1  26.0 - 34.0 pg Final  . MCHC 03/01/2014 32.0  30.0 - 36.0 g/dL Final  . RDW 03/01/2014 13.5  11.5 - 15.5 % Final  . Platelets 03/01/2014 192  150 - 400 K/uL Final  . Sodium 03/01/2014 142  137 - 147 mEq/L Final  . Potassium 03/01/2014 4.0  3.7 - 5.3 mEq/L Final  . Chloride 03/01/2014 103  96 - 112 mEq/L Final  . CO2 03/01/2014 29  19 - 32 mEq/L Final  . Glucose, Bld 03/01/2014 125* 70 - 99 mg/dL Final  . BUN 03/01/2014 15  6 - 23 mg/dL Final  . Creatinine, Ser 03/01/2014  0.80  0.50 - 1.10 mg/dL Final  . Calcium 03/01/2014 9.0  8.4 - 10.5 mg/dL Final  . GFR calc non Af Amer 03/01/2014 78* >90 mL/min Final  . GFR calc Af Amer 03/01/2014 >90  >90 mL/min Final   Comment: (NOTE)                          The eGFR has been calculated using the CKD EPI equation.                          This calculation has not been validated in all clinical situations.                          eGFR's persistently <90 mL/min signify possible Chronic Kidney                          Disease.  . Anion gap 03/01/2014 10  5 - 15 Final  . WBC 03/02/2014 12.7* 4.0 - 10.5 K/uL Final  . RBC 03/02/2014 3.86* 3.87 - 5.11 MIL/uL Final  . Hemoglobin 03/02/2014 11.2* 12.0 - 15.0  g/dL Final  . HCT 03/02/2014 34.8* 36.0 - 46.0 % Final  . MCV 03/02/2014 90.2  78.0 - 100.0 fL Final  . MCH 03/02/2014 29.0  26.0 - 34.0 pg Final  . MCHC 03/02/2014 32.2  30.0 - 36.0 g/dL Final  . RDW 03/02/2014 13.5  11.5 - 15.5 % Final  . Platelets 03/02/2014 181  150 - 400 K/uL Final  Hospital Outpatient Visit on 02/21/2014  Component Date Value Ref Range Status  . aPTT 02/21/2014 28  24 - 37 seconds Final  . MRSA, PCR 02/21/2014 NEGATIVE  NEGATIVE Final  . Staphylococcus aureus 02/21/2014 NEGATIVE  NEGATIVE Final   Comment:                                 The Xpert SA Assay (FDA                          approved for NASAL specimens                          in patients over 28 years of age),                          is one component of                          a comprehensive surveillance                          program.  Test performance has                          been validated by [employer] for patients greater                          than or equal to 67 year old.                          It is not intended                          to diagnose infection nor to                          guide or monitor treatment.  . Sodium 02/21/2014 142  137 - 147 mEq/L Final  . Potassium  02/21/2014 4.0  3.7 - 5.3 mEq/L Final  . Chloride 02/21/2014 104  96 - 112 mEq/L Final  . CO2 02/21/2014 25  19 - 32 mEq/L Final  . Glucose, Bld 02/21/2014 100* 70 - 99 mg/dL Final  . BUN 02/21/2014 26* 6 - 23 mg/dL Final  . Creatinine, Ser 02/21/2014 0.92  0.50 - 1.10 mg/dL Final  . Calcium 02/21/2014 9.4  8.4 - 10.5 mg/dL Final  . GFR calc non Af Amer 02/21/2014 66* >90 mL/min Final  . GFR calc Af Amer 02/21/2014 76* >90 mL/min Final   Comment: (NOTE)  The eGFR has been calculated using the CKD EPI equation.                          This calculation has not been validated in all clinical situations.                          eGFR's persistently <90 mL/min signify possible Chronic Kidney                          Disease.  . Anion gap 02/21/2014 13  5 - 15 Final  . WBC 02/21/2014 10.1  4.0 - 10.5 K/uL Final  . RBC 02/21/2014 4.66  3.87 - 5.11 MIL/uL Final  . Hemoglobin 02/21/2014 13.7  12.0 - 15.0 g/dL Final  . HCT 02/21/2014 42.7  36.0 - 46.0 % Final  . MCV 02/21/2014 91.6  78.0 - 100.0 fL Final  . MCH 02/21/2014 29.4  26.0 - 34.0 pg Final  . MCHC 02/21/2014 32.1  30.0 - 36.0 g/dL Final  . RDW 02/21/2014 13.7  11.5 - 15.5 % Final  . Platelets 02/21/2014 222  150 - 400 K/uL Final  . Prothrombin Time 02/21/2014 13.3  11.6 - 15.2 seconds Final  . INR 02/21/2014 1.01  0.00 - 1.49 Final  . Color, Urine 02/21/2014 YELLOW  YELLOW Final  . APPearance 02/21/2014 CLOUDY* CLEAR Final  . Specific Gravity, Urine 02/21/2014 1.020  1.005 - 1.030 Final  . pH 02/21/2014 5.5  5.0 - 8.0 Final  . Glucose, UA 02/21/2014 NEGATIVE  NEGATIVE mg/dL Final  . Hgb urine dipstick 02/21/2014 NEGATIVE  NEGATIVE Final  . Bilirubin Urine 02/21/2014 NEGATIVE  NEGATIVE Final  . Ketones, ur 02/21/2014 NEGATIVE  NEGATIVE mg/dL Final  . Protein, ur 02/21/2014 NEGATIVE  NEGATIVE mg/dL Final  . Urobilinogen, UA 02/21/2014 0.2  0.0 - 1.0 mg/dL Final  . Nitrite 02/21/2014 NEGATIVE  NEGATIVE Final    . Leukocytes, UA 02/21/2014 SMALL* NEGATIVE Final  . ABO/RH(D) 02/21/2014 A POS   Final  . Antibody Screen 02/21/2014 NEG   Final  . Sample Expiration 02/21/2014 03/03/2014   Final  . Squamous Epithelial / LPF 02/21/2014 FEW* RARE Final  . WBC, UA 02/21/2014 3-6  <3 WBC/hpf Final  . ABO/RH(D) 02/21/2014 A POS   Final  Lab on 01/10/2014  Component Date Value Ref Range Status  . WBC 01/10/2014 11.9* 4.0 - 10.5 K/uL Final  . RBC 01/10/2014 4.55  3.87 - 5.11 MIL/uL Final  . Hemoglobin 01/10/2014 13.0  12.0 - 15.0 g/dL Final  . HCT 01/10/2014 40.0  36.0 - 46.0 % Final  . MCV 01/10/2014 87.9  78.0 - 100.0 fL Final  . MCH 01/10/2014 28.6  26.0 - 34.0 pg Final  . MCHC 01/10/2014 32.5  30.0 - 36.0 g/dL Final  . RDW 01/10/2014 13.9  11.5 - 15.5 % Final  . Platelets 01/10/2014 260  150 - 400 K/uL Final  . Neutrophils Relative % 01/10/2014 56  43 - 77 % Final  . Neutro Abs 01/10/2014 6.7  1.7 - 7.7 K/uL Final  . Lymphocytes Relative 01/10/2014 34  12 - 46 % Final  . Lymphs Abs 01/10/2014 4.0  0.7 - 4.0 K/uL Final  . Monocytes Relative 01/10/2014 7  3 - 12 % Final  . Monocytes Absolute 01/10/2014 0.8  0.1 - 1.0 K/uL Final  . Eosinophils Relative 01/10/2014 2  0 -  5 % Final  . Eosinophils Absolute 01/10/2014 0.2  0.0 - 0.7 K/uL Final  . Basophils Relative 01/10/2014 1  0 - 1 % Final  . Basophils Absolute 01/10/2014 0.1  0.0 - 0.1 K/uL Final  . Smear Review 01/10/2014 Criteria for review not met   Final     X-Rays:Dg Chest 2 View  02/21/2014   CLINICAL DATA:  Knee replacement.  EXAM: CHEST  2 VIEW  COMPARISON:  None.  FINDINGS: Mediastinum and hilar structures normal. Lungs are clear. Heart size normal. No pleural effusion or pneumothorax. Degenerative changes thoracic spine.  IMPRESSION: No active cardiopulmonary disease.   Electronically Signed   By: Marcello Moores  Register   On: 02/21/2014 15:12   Dg Knee 1-2 Views Right  02/28/2014   CLINICAL DATA:  Status post arthroplasty  EXAM: RIGHT KNEE - 1-2  VIEW  COMPARISON:  February 21, 2014  FINDINGS: Frontal and lateral views were obtained. Patient is status post total knee arthroplasty with femoral and tibial components appearing well-seated. No fracture or dislocation. There is a drain in the patellofemoral joint. Air within the joint is an expected postoperative finding.  IMPRESSION: Prosthetic components appear well seated. No acute fracture or dislocation.   Electronically Signed   By: Lowella Grip M.D.   On: 02/28/2014 10:57   Dg Knee 1-2 Views Right  02/21/2014   CLINICAL DATA:  Right knee replacement .  Preop.  EXAM: RIGHT KNEE - 1-2 VIEW  COMPARISON:  None.  FINDINGS: Severe tricompartment degenerative change. No acute bony or joint abnormality.  IMPRESSION: Severe tricompartment degenerative change.  No acute abnormality.   Electronically Signed   By: Marcello Moores  Register   On: 02/21/2014 15:11    EKG: Orders placed in visit on 08/29/13  . EKG 12-LEAD     Hospital Course: Jade Singh is a 63 y.o. who was admitted to [facility]. They were brought to the operating room on 02/28/2014 and underwent Procedure(s): RIGHT TOTAL KNEE ARTHROPLASTY.  Patient tolerated the procedure well and was later transferred to the recovery room and then to the orthopaedic floor for postoperative care.  They were given PO and IV analgesics for pain control following their surgery.  They were given 24 hours of postoperative antibiotics of  Anti-infectives   Start     Dose/Rate Route Frequency Ordered Stop   02/28/14 1300  ceFAZolin (ANCEF) IVPB 2 g/50 mL premix     2 g 100 mL/hr over 30 Minutes Intravenous Every 6 hours 02/28/14 1236 02/28/14 1839   02/28/14 0935  polymyxin B 500,000 Units, bacitracin 50,000 Units in sodium chloride irrigation 0.9 % 500 mL irrigation  Status:  Discontinued       As needed 02/28/14 0935 02/28/14 1013   02/28/14 0630  ceFAZolin (ANCEF) IVPB 2 g/50 mL premix     2 g 100 mL/hr over 30 Minutes Intravenous On call to  O.R. 02/28/14 1610 02/28/14 0733     and started on DVT prophylaxis in the form of Xarelto, TED hose and SCD's.   PT and OT were ordered for total joint protocol.  Discharge planning consulted to help with postop disposition and equipment needs.  Patient had a good night on the evening of surgery.  They started to get up OOB with therapy on day one. Hemovac drain was pulled without difficulty.  Continued to work with therapy into day two.  By day two, the patient had progressed with therapy and meeting their goals.  Incision was  healing well.  Patient was seen in rounds and was ready to go home.   Diet: Regular diet Activity:WBAT Follow-up:in 10-14 days Disposition - Home with HHPT Discharged Condition: good      Medication List    STOP taking these medications       aspirin 325 MG tablet     Magnesium 250 MG Tabs     naproxen 250 MG tablet  Commonly known as:  NAPROSYN     naproxen sodium 220 MG tablet  Commonly known as:  ANAPROX     pantoprazole 40 MG tablet  Commonly known as:  PROTONIX      TAKE these medications       acitretin 25 MG capsule  Commonly known as:  SORIATANE  Take 25 mg by mouth every Monday, Wednesday, and Friday.     cetirizine 10 MG tablet  Commonly known as:  ZYRTEC  Take 10 mg by mouth daily.     diazepam 10 MG tablet  Commonly known as:  VALIUM  Take 1 tablet (10 mg total) by mouth every 6 (six) hours as needed for anxiety.     docusate sodium 100 MG capsule  Commonly known as:  COLACE  Take 1 capsule (100 mg total) by mouth 2 (two) times daily as needed for mild constipation.     methocarbamol 500 MG tablet  Commonly known as:  ROBAXIN  Take 1 tablet (500 mg total) by mouth 3 (three) times daily.     oxyCODONE-acetaminophen 7.5-325 MG per tablet  Commonly known as:  PERCOCET  Take 1 tablet by mouth every 4 (four) hours as needed for pain.     polyethylene glycol powder powder  Commonly known as:  GLYCOLAX/MIRALAX  Take 17 g by  mouth every Monday, Wednesday, and Friday.     pravastatin 40 MG tablet  Commonly known as:  PRAVACHOL  Take 40 mg by mouth at bedtime.     rivaroxaban 10 MG Tabs tablet  Commonly known as:  XARELTO  Take 1 tablet (10 mg total) by mouth daily.     triamterene-hydrochlorothiazide 37.5-25 MG per tablet  Commonly known as:  MAXZIDE-25  Take 1 tablet by mouth at bedtime.     Vitamin D3 5000 UNITS Caps  Take 5,000 Units by mouth every Monday, Wednesday, and Friday.     zolpidem 5 MG tablet  Commonly known as:  AMBIEN  Take 1 tablet (5 mg total) by mouth at bedtime as needed for sleep.           Follow-up Information   Follow up with Tedford Berg C, MD In 2 weeks.   Specialty:  Orthopedic Surgery   Contact information:   [address] [place] [address] [phone]       Follow up with [place])    Contact information:   [place] [address] [phone]       Signed: Lacie Draft, PA-C Orthopaedic Surgery 03/02/2014, 3:51 PM

## 2014-03-02 NOTE — Progress Notes (Signed)
Physical Therapy Treatment Patient Details Name: Jade PitchJacqueline J Keo MRN: [id number] DOB: 07/05/1951 Today's Date: 03/02/2014    History of Present Illness R TKA 02/28/14    PT Comments    Pt ambulated in hallway and practiced one step with spouse present.  Pt also performed exercises in supine and provided with HEP handout.  Pt and spouse had no further questions and pt felt ready for d/c home today.  Follow Up Recommendations  Home health PT;Supervision/Assistance - 24 hour     Equipment Recommendations  Rolling walker with 5" wheels    Recommendations for Other Services       Precautions / Restrictions Precautions Precautions: Knee Required Braces or Orthoses: Knee Immobilizer - Right Knee Immobilizer - Right: Discontinue once straight leg raise with < 10 degree lag Restrictions Weight Bearing Restrictions: No    Mobility  Bed Mobility Overal bed mobility: Needs Assistance Bed Mobility: Supine to Sit;Sit to Supine     Supine to sit: Min assist Sit to supine: Min assist   General bed mobility comments: spouse educated on assisting pt's R LE  Transfers Overall transfer level: Needs assistance Equipment used: Rolling walker (2 wheeled) Transfers: Sit to/from Stand Sit to Stand: Min guard         General transfer comment: verbal cues for UE and LE placement  Ambulation/Gait Ambulation/Gait assistance: Min guard Ambulation Distance (Feet): 60 Feet Assistive device: Rolling walker (2 wheeled) Gait Pattern/deviations: Step-to pattern;Antalgic Gait velocity: decr   General Gait Details: verbal cues for safe RW position, step length, sequence, posture   Stairs Stairs: Yes Stairs assistance: Min guard Stair Management: Step to pattern;Forwards;With walker Number of Stairs: 1 General stair comments: verbal cues for safety, sequence, RW positioning, spouse present and educated as well  Wheelchair Mobility    Modified Rankin (Stroke Patients Only)        Balance                                    Cognition Arousal/Alertness: Awake/alert Behavior During Therapy: WFL for tasks assessed/performed Overall Cognitive Status: Within Functional Limits for tasks assessed                      Exercises Total Joint Exercises Ankle Circles/Pumps: AROM;Both;15 reps Quad Sets: AROM;Both;15 reps Towel Squeeze: AROM;Both;15 reps Short Arc QuadBarbaraann Boys: AAROM;Right;10 reps Heel Slides: AAROM;Right;10 reps Hip ABduction/ADduction: AAROM;Right;10 reps Goniometric ROM: knee flexion approx 30*    General Comments        Pertinent Vitals/Pain Pain Assessment: 0-10 Pain Score: 4  Pain Location: R knee Pain Descriptors / Indicators: Sore Pain Intervention(s): Limited activity within patient's tolerance;Monitored during session;Repositioned;Premedicated before session    Home Living                      Prior Function            PT Goals (current goals can now be found in the care plan section) Progress towards PT goals: Progressing toward goals    Frequency  7X/week    PT Plan Current plan remains appropriate    Co-evaluation             End of Session Equipment Utilized During Treatment: Right knee immobilizer Activity Tolerance: Patient limited by pain Patient left: in bed;with call bell/phone within reach;with family/visitor present     Time: [id number] PT Time Calculation (min): 30 min  Charges:  $Gait Training: 8-22 mins $Therapeutic Exercise: 8-22 mins                    G Codes:      Philo Kurtz,KATHrine E 14-Mar-2014, 2:25 PM Zenovia Jarred, PT, DPT 2014/03/14 Pager: [phone]

## 2014-03-02 NOTE — Progress Notes (Signed)
Subjective: 2 Days Post-Op Procedure(s) (LRB): RIGHT TOTAL KNEE ARTHROPLASTY (Right) Patient reports pain as 2 on 0-10 scale. Ready fo DC.   Objective: Vital signs in last 24 hours: Temp:  [97.8 F (36.6 C)-98.9 F (37.2 C)] 98.9 F (37.2 C) (08/15 0513) Pulse Rate:  [59-80] 80 (08/15 0513) Resp:  [14-16] 14 (08/15 0513) BP: (126-151)/(61-75) 142/71 mmHg (08/15 0513) SpO2:  [98 %-100 %] 100 % (08/15 0513)  Intake/Output from previous day: 08/14 0701 - 08/15 0700 In: 2386.7 [P.O.:1200; I.V.:1186.7] Out: 950 [Urine:950] Intake/Output this shift: Total I/O In: -  Out: 450 [Urine:450]   Recent Labs  03/01/14 0425 03/02/14 0538  HGB 11.4* 11.2*    Recent Labs  03/01/14 0425 03/02/14 0538  WBC 14.7* 12.7*  RBC 3.92 3.86*  HCT 35.6* 34.8*  PLT 192 181    Recent Labs  03/01/14 0425  NA 142  K 4.0  CL 103  CO2 29  BUN 15  CREATININE 0.80  GLUCOSE 125*  CALCIUM 9.0   No results found for this basename: LABPT, INR,  in the last 72 hours  Neurologically intact  Assessment/Plan: 2 Days Post-Op Procedure(s) (LRB): RIGHT TOTAL KNEE ARTHROPLASTY (Right) Discharge home with home health  Evoleht Hovatter A 03/02/2014, 9:12 AM

## 2014-03-18 ENCOUNTER — Ambulatory Visit: Payer: BC Managed Care – PPO | Attending: Specialist | Admitting: Physical Therapy

## 2014-03-18 DIAGNOSIS — M25669 Stiffness of unspecified knee, not elsewhere classified: Secondary | ICD-10-CM | POA: Insufficient documentation

## 2014-03-18 DIAGNOSIS — M25569 Pain in unspecified knee: Secondary | ICD-10-CM | POA: Insufficient documentation

## 2014-03-18 DIAGNOSIS — R5381 Other malaise: Secondary | ICD-10-CM | POA: Insufficient documentation

## 2014-03-18 DIAGNOSIS — IMO0001 Reserved for inherently not codable concepts without codable children: Secondary | ICD-10-CM | POA: Insufficient documentation

## 2014-03-19 ENCOUNTER — Ambulatory Visit: Payer: BC Managed Care – PPO | Attending: Specialist | Admitting: Physical Therapy

## 2014-03-19 DIAGNOSIS — R5381 Other malaise: Secondary | ICD-10-CM | POA: Diagnosis not present

## 2014-03-19 DIAGNOSIS — M25569 Pain in unspecified knee: Secondary | ICD-10-CM | POA: Insufficient documentation

## 2014-03-19 DIAGNOSIS — M25669 Stiffness of unspecified knee, not elsewhere classified: Secondary | ICD-10-CM | POA: Insufficient documentation

## 2014-03-19 DIAGNOSIS — IMO0001 Reserved for inherently not codable concepts without codable children: Secondary | ICD-10-CM | POA: Diagnosis not present

## 2014-03-21 ENCOUNTER — Ambulatory Visit: Payer: BC Managed Care – PPO | Admitting: Physical Therapy

## 2014-03-21 DIAGNOSIS — IMO0001 Reserved for inherently not codable concepts without codable children: Secondary | ICD-10-CM | POA: Diagnosis not present

## 2014-03-26 ENCOUNTER — Ambulatory Visit: Payer: BC Managed Care – PPO | Admitting: Physical Therapy

## 2014-03-26 DIAGNOSIS — IMO0001 Reserved for inherently not codable concepts without codable children: Secondary | ICD-10-CM | POA: Diagnosis not present

## 2014-03-28 ENCOUNTER — Encounter: Payer: BC Managed Care – PPO | Admitting: Physical Therapy

## 2014-04-01 ENCOUNTER — Ambulatory Visit: Payer: BC Managed Care – PPO | Admitting: Physical Therapy

## 2014-04-02 ENCOUNTER — Ambulatory Visit: Payer: BC Managed Care – PPO | Admitting: *Deleted

## 2014-04-02 DIAGNOSIS — IMO0001 Reserved for inherently not codable concepts without codable children: Secondary | ICD-10-CM | POA: Diagnosis not present

## 2014-04-04 ENCOUNTER — Ambulatory Visit: Payer: BC Managed Care – PPO | Admitting: Physical Therapy

## 2014-04-04 DIAGNOSIS — IMO0001 Reserved for inherently not codable concepts without codable children: Secondary | ICD-10-CM | POA: Diagnosis not present

## 2014-04-08 ENCOUNTER — Ambulatory Visit: Payer: BC Managed Care – PPO | Admitting: Physical Therapy

## 2014-04-08 DIAGNOSIS — IMO0001 Reserved for inherently not codable concepts without codable children: Secondary | ICD-10-CM | POA: Diagnosis not present

## 2014-04-09 ENCOUNTER — Ambulatory Visit: Payer: BC Managed Care – PPO | Admitting: *Deleted

## 2014-04-09 ENCOUNTER — Other Ambulatory Visit: Payer: Self-pay | Admitting: Physician Assistant

## 2014-04-09 DIAGNOSIS — IMO0001 Reserved for inherently not codable concepts without codable children: Secondary | ICD-10-CM | POA: Diagnosis not present

## 2014-04-11 ENCOUNTER — Ambulatory Visit: Payer: BC Managed Care – PPO | Admitting: *Deleted

## 2014-04-11 DIAGNOSIS — IMO0001 Reserved for inherently not codable concepts without codable children: Secondary | ICD-10-CM | POA: Diagnosis not present

## 2014-04-15 ENCOUNTER — Encounter: Payer: BC Managed Care – PPO | Admitting: Physical Therapy

## 2014-04-17 ENCOUNTER — Ambulatory Visit: Payer: BC Managed Care – PPO | Admitting: Physical Therapy

## 2014-04-17 DIAGNOSIS — IMO0001 Reserved for inherently not codable concepts without codable children: Secondary | ICD-10-CM | POA: Diagnosis not present

## 2014-04-18 ENCOUNTER — Ambulatory Visit: Payer: BC Managed Care – PPO | Attending: Specialist | Admitting: Physical Therapy

## 2014-04-18 DIAGNOSIS — M25661 Stiffness of right knee, not elsewhere classified: Secondary | ICD-10-CM | POA: Insufficient documentation

## 2014-04-18 DIAGNOSIS — Z5189 Encounter for other specified aftercare: Secondary | ICD-10-CM | POA: Insufficient documentation

## 2014-04-18 DIAGNOSIS — R5381 Other malaise: Secondary | ICD-10-CM | POA: Diagnosis not present

## 2014-04-18 DIAGNOSIS — M25561 Pain in right knee: Secondary | ICD-10-CM | POA: Diagnosis not present

## 2014-04-23 ENCOUNTER — Ambulatory Visit: Payer: BC Managed Care – PPO | Admitting: *Deleted

## 2014-04-23 DIAGNOSIS — Z5189 Encounter for other specified aftercare: Secondary | ICD-10-CM | POA: Diagnosis not present

## 2014-04-24 ENCOUNTER — Telehealth: Payer: Self-pay | Admitting: Cardiovascular Disease

## 2014-04-24 ENCOUNTER — Encounter: Payer: Self-pay | Admitting: *Deleted

## 2014-04-24 MED ORDER — TRIAMTERENE-HCTZ 37.5-25 MG PO TABS: 1.0000 | ORAL_TABLET | Freq: Every day | ORAL | Status: DC

## 2014-04-24 NOTE — Telephone Encounter (Signed)
Rx was sent to pharmacy electronically. Patient reminded of OV 12/3

## 2014-04-24 NOTE — Telephone Encounter (Signed)
Pt called in needing her prescription for Triamt/Hctz 37.5/25mg  refilled and called in the [facility] in [place]  Thanks

## 2014-04-25 ENCOUNTER — Ambulatory Visit: Payer: BC Managed Care – PPO | Admitting: *Deleted

## 2014-04-25 DIAGNOSIS — Z5189 Encounter for other specified aftercare: Secondary | ICD-10-CM | POA: Diagnosis not present

## 2014-04-30 ENCOUNTER — Ambulatory Visit: Payer: BC Managed Care – PPO | Admitting: Physical Therapy

## 2014-04-30 DIAGNOSIS — Z5189 Encounter for other specified aftercare: Secondary | ICD-10-CM | POA: Diagnosis not present

## 2014-05-02 ENCOUNTER — Ambulatory Visit: Payer: BC Managed Care – PPO | Admitting: Physical Therapy

## 2014-05-02 DIAGNOSIS — Z5189 Encounter for other specified aftercare: Secondary | ICD-10-CM | POA: Diagnosis not present

## 2014-05-07 ENCOUNTER — Ambulatory Visit: Payer: BC Managed Care – PPO | Admitting: Physical Therapy

## 2014-05-07 DIAGNOSIS — Z5189 Encounter for other specified aftercare: Secondary | ICD-10-CM | POA: Diagnosis not present

## 2014-05-10 ENCOUNTER — Ambulatory Visit: Payer: BC Managed Care – PPO | Admitting: *Deleted

## 2014-05-10 DIAGNOSIS — Z5189 Encounter for other specified aftercare: Secondary | ICD-10-CM | POA: Diagnosis not present

## 2014-05-14 ENCOUNTER — Ambulatory Visit: Payer: BC Managed Care – PPO | Admitting: Physical Therapy

## 2014-05-14 DIAGNOSIS — Z5189 Encounter for other specified aftercare: Secondary | ICD-10-CM | POA: Diagnosis not present

## 2014-05-16 ENCOUNTER — Ambulatory Visit: Payer: BC Managed Care – PPO | Admitting: Physical Therapy

## 2014-05-16 DIAGNOSIS — Z5189 Encounter for other specified aftercare: Secondary | ICD-10-CM | POA: Diagnosis not present

## 2014-05-20 ENCOUNTER — Encounter: Payer: Self-pay | Admitting: *Deleted

## 2014-05-21 ENCOUNTER — Ambulatory Visit: Payer: BC Managed Care – PPO | Attending: Specialist | Admitting: Physical Therapy

## 2014-05-21 DIAGNOSIS — Z5189 Encounter for other specified aftercare: Secondary | ICD-10-CM | POA: Diagnosis not present

## 2014-05-21 DIAGNOSIS — M25561 Pain in right knee: Secondary | ICD-10-CM | POA: Diagnosis not present

## 2014-05-21 DIAGNOSIS — R5381 Other malaise: Secondary | ICD-10-CM | POA: Diagnosis not present

## 2014-05-21 DIAGNOSIS — M25661 Stiffness of right knee, not elsewhere classified: Secondary | ICD-10-CM | POA: Diagnosis not present

## 2014-05-23 ENCOUNTER — Ambulatory Visit: Payer: BC Managed Care – PPO | Admitting: Physical Therapy

## 2014-05-23 ENCOUNTER — Other Ambulatory Visit: Payer: Self-pay | Admitting: *Deleted

## 2014-05-23 DIAGNOSIS — Z5189 Encounter for other specified aftercare: Secondary | ICD-10-CM | POA: Diagnosis not present

## 2014-05-23 MED ORDER — PRAVASTATIN SODIUM 40 MG PO TABS: 40.0000 mg | ORAL_TABLET | Freq: Every day | ORAL | Status: DC

## 2014-05-27 ENCOUNTER — Ambulatory Visit: Payer: BC Managed Care – PPO | Admitting: Physical Therapy

## 2014-05-27 DIAGNOSIS — Z5189 Encounter for other specified aftercare: Secondary | ICD-10-CM | POA: Diagnosis not present

## 2014-05-30 ENCOUNTER — Other Ambulatory Visit: Payer: Self-pay | Admitting: Cardiovascular Disease

## 2014-05-30 ENCOUNTER — Ambulatory Visit: Payer: BC Managed Care – PPO | Admitting: Physical Therapy

## 2014-05-30 DIAGNOSIS — Z5189 Encounter for other specified aftercare: Secondary | ICD-10-CM | POA: Diagnosis not present

## 2014-05-30 NOTE — Telephone Encounter (Signed)
Rx was sent to pharmacy electronically. OV 12/3 

## 2014-06-10 ENCOUNTER — Other Ambulatory Visit: Payer: Self-pay | Admitting: Physician Assistant

## 2014-06-11 ENCOUNTER — Other Ambulatory Visit: Payer: Self-pay | Admitting: Physician Assistant

## 2014-06-20 ENCOUNTER — Encounter: Payer: Self-pay | Admitting: Cardiovascular Disease

## 2014-06-20 ENCOUNTER — Ambulatory Visit (INDEPENDENT_AMBULATORY_CARE_PROVIDER_SITE_OTHER): Payer: BC Managed Care – PPO | Admitting: Cardiovascular Disease

## 2014-06-20 VITALS — BP 138/72 | HR 76 | Resp 16 | Ht 65.5 in | Wt 195.0 lb

## 2014-06-20 DIAGNOSIS — E7800 Pure hypercholesterolemia, unspecified: Secondary | ICD-10-CM

## 2014-06-20 DIAGNOSIS — I10 Essential (primary) hypertension: Secondary | ICD-10-CM

## 2014-06-20 DIAGNOSIS — Z79899 Other long term (current) drug therapy: Secondary | ICD-10-CM

## 2014-06-20 NOTE — Progress Notes (Signed)
Patient ID: Jade Singh, female   DOB: 02/04/1951, 63 y.o.   MRN: [id number]     Reason for office visit HTN, Hyperlipidemia  This is my first meeting with Jade Singh who was a former longtime patient of Dr. Susa Griffinsichard Weintraub. She has systemic hypertension and hyperlipidemia both well managed with pharmacological means. Significant comorbidities include arthritis and psoriasis. She has a very strong family history of coronary disease but had a normal nuclear stress test in 2013 and a normal echocardiogram the same year. She has normal left ventricular systolic and diastolic function by echo with an ejection fraction of 55-65 percent. She has no symptoms of cardiovascular illness. Using [employer] she has managed to lose about 40 pounds since her last appointment with Dr. [name] in July 2014. I congratulated her for this tremendous accomplishment.   Allergies  Allergen Reactions  . Levaquin [Levofloxacin In D5w]     yeast  . Oruvail [Ketoprofen]     GI upset    Current Outpatient Prescriptions  Medication Sig Dispense Refill  . acitretin (SORIATANE) 25 MG capsule Take 25 mg by mouth every Monday, Wednesday, and Friday.    . cetirizine (ZYRTEC) 10 MG tablet Take 10 mg by mouth daily.    . Cholecalciferol (VITAMIN D3) 5000 UNITS CAPS Take 5,000 Units by mouth every Monday, Wednesday, and Friday.    . diazepam (VALIUM) 10 MG tablet Take 1 tablet (10 mg total) by mouth every 6 (six) hours as needed for anxiety. 90 tablet 0  . naproxen (NAPROSYN) 500 MG tablet Take 1 tablet by mouth as needed.    Marland Kitchen. oxyCODONE-acetaminophen (PERCOCET) 7.5-325 MG per tablet Take 1 tablet by mouth every 4 (four) hours as needed for pain. 60 tablet 0  . pantoprazole (PROTONIX) 40 MG tablet Take 1 tablet (40 mg total) by mouth daily. 90 tablet 0  . pravastatin (PRAVACHOL) 40 MG tablet Take 1 tablet (40 mg total) by mouth at bedtime. 90 tablet 0  . triamterene-hydrochlorothiazide (MAXZIDE-25) 37.5-25 MG  per tablet Take 1 tablet by mouth at bedtime. 90 tablet 0  . zolpidem (AMBIEN) 5 MG tablet TAKE ONE TABLET BY MOUTH ONCE DAILY AT BEDTIME 15 tablet 0   No current facility-administered medications for this visit.    Past Medical History  Diagnosis Date  . Hypertension   . Hyperlipidemia   . GERD (gastroesophageal reflux disease)   . Unspecified vitamin D deficiency   . Obese   . Family history of anesthesia complication     "mother quit breathing" 30 yrs ago pt does not know any more details. Pt has never had any problems with anesthesia herself  . DJD (degenerative joint disease)   . Psoriasis     HANDS  . Urgency of urination   . Difficulty sleeping     takes Ambien  . Depression   . Anxiety     Past Surgical History  Procedure Laterality Date  . Knee arthroscopy      RT 2011, LT 2009  . Appendectomy    . Tubal ligation    . Abdominal hysterectomy      partial  . Tonsillectomy and adenoidectomy    . Cardiac catheterization  2005     no problems per pt   . Total knee arthroplasty Right 02/28/2014    Procedure: RIGHT TOTAL KNEE ARTHROPLASTY;  Surgeon: Javier DockerJeffrey C Beane, MD;  Location: [facility];  Service: Orthopedics;  Laterality: Right;    Family History  Problem Relation Age of  Onset  . Stroke Mother   . Hyperlipidemia Mother   . Cancer Brother [id number]    prostate, deceased  . Diabetes Paternal Uncle   . Heart disease Paternal Grandfather   . Colon cancer Neg Hx     History   Social History  . Marital Status: Married    Spouse Name: N/A    Number of Children: N/A  . Years of Education: N/A   Occupational History  . Not on file.   Social History Main Topics  . Smoking status: Former Smoker    Quit date: 07/19/2000  . Smokeless tobacco: Never Used  . Alcohol Use: Yes     Comment: occ  . Drug Use: No  . Sexual Activity: Not on file   Other Topics Concern  . Not on file   Social History Narrative    Review of systems: The patient specifically denies  any chest pain at rest or with exertion, dyspnea at rest or with exertion, orthopnea, paroxysmal nocturnal dyspnea, syncope, palpitations, focal neurological deficits, intermittent claudication, lower extremity edema, unexplained weight gain, cough, hemoptysis or wheezing.  The patient also denies abdominal pain, nausea, vomiting, dysphagia, diarrhea, constipation, polyuria, polydipsia, dysuria, hematuria, frequency, urgency, abnormal bleeding or bruising, fever, chills, unexpected weight changes, mood swings, change in skin or hair texture, change in voice quality, auditory or visual problems, allergic reactions, new musculoskeletal complaints other than usual "aches and pains". She has psoriatic rash primarily on the palms of her hands   PHYSICAL EXAM BP 138/72 mmHg  Pulse 76  Ht 5' 5.5" (1.664 m)  Wt 195 lb (88.451 kg)  BMI 31.94 kg/m2  General: Alert, oriented x3, no distress Head: no evidence of trauma, PERRL, EOMI, no exophtalmos or lid lag, no myxedema, no xanthelasma; normal ears, nose and oropharynx Neck: normal jugular venous pulsations and no hepatojugular reflux; brisk carotid pulses without delay and no carotid bruits Chest: clear to auscultation, no signs of consolidation by percussion or palpation, normal fremitus, symmetrical and full respiratory excursions Cardiovascular: normal position and quality of the apical impulse, regular rhythm, normal first and second heart sounds, short 1/6 early systolic murmur in the aortic focus, no diastolic murmurs, rubs or gallops Abdomen: no tenderness or distention, no masses by palpation, no abnormal pulsatility or arterial bruits, normal bowel sounds, no hepatosplenomegaly Extremities: no clubbing, cyanosis or edema; 2+ radial, ulnar and brachial pulses bilaterally; 2+ right femoral, posterior tibial and dorsalis pedis pulses; 2+ left femoral, posterior tibial and dorsalis pedis pulses; no subclavian or femoral bruits Neurological: grossly  nonfocal Desquamative erythematous rash on the palms of both hands  EKG: Normal sinus rhythm, normal tracing  Lipid Panel     Component Value Date/Time   CHOL 148 01/03/2014 1701   TRIG 57 01/03/2014 1701   HDL 61 01/03/2014 1701   CHOLHDL 2.4 01/03/2014 1701   VLDL 11 01/03/2014 1701   LDLCALC 76 01/03/2014 1701    BMET    Component Value Date/Time   NA 142 03/01/2014 0425   K 4.0 03/01/2014 0425   CL 103 03/01/2014 0425   CO2 29 03/01/2014 0425   GLUCOSE 125* 03/01/2014 0425   BUN 15 03/01/2014 0425   CREATININE 0.80 03/01/2014 0425   CREATININE 0.90 01/03/2014 1701   CALCIUM 9.0 03/01/2014 0425   GFRNONAA 78* 03/01/2014 0425   GFRNONAA 69 01/03/2014 1701   GFRAA >90 03/01/2014 0425   GFRAA 80 01/03/2014 1701     ASSESSMENT AND PLAN  Mrs. Ellery PlunkBest has well-controlled  systemic hypertension and hyperlipidemia and is actively engaged in losing weight and improving her health. No changes are made to her medications today. She will follow-up on a yearly basis.  Orders Placed This Encounter  Procedures  . Lipid panel  . Comprehensive metabolic panel  . EKG 12-Lead   Meds ordered this encounter  Medications  . naproxen (NAPROSYN) 500 MG tablet    Sig: Take 1 tablet by mouth as needed.    Junious Silk, MD, [facility] [phone] office [phone] pager

## 2014-06-20 NOTE — Patient Instructions (Signed)
Your physician recommends that you return for lab work in: FASTING at [facility] lab at your convenience.  Dr. Croitoru recommends that you schedule a follow-up appointment in: One year.      

## 2014-06-25 ENCOUNTER — Encounter: Payer: Self-pay | Admitting: Physician Assistant

## 2014-06-25 ENCOUNTER — Ambulatory Visit (INDEPENDENT_AMBULATORY_CARE_PROVIDER_SITE_OTHER): Payer: BC Managed Care – PPO | Admitting: Physician Assistant

## 2014-06-25 VITALS — BP 132/82 | HR 76 | Temp 97.9°F | Resp 16 | Ht 65.0 in | Wt 196.0 lb

## 2014-06-25 DIAGNOSIS — E559 Vitamin D deficiency, unspecified: Secondary | ICD-10-CM

## 2014-06-25 DIAGNOSIS — Z23 Encounter for immunization: Secondary | ICD-10-CM

## 2014-06-25 DIAGNOSIS — I10 Essential (primary) hypertension: Secondary | ICD-10-CM

## 2014-06-25 DIAGNOSIS — D649 Anemia, unspecified: Secondary | ICD-10-CM

## 2014-06-25 DIAGNOSIS — E669 Obesity, unspecified: Secondary | ICD-10-CM

## 2014-06-25 DIAGNOSIS — R7309 Other abnormal glucose: Secondary | ICD-10-CM | POA: Insufficient documentation

## 2014-06-25 DIAGNOSIS — R7303 Prediabetes: Secondary | ICD-10-CM

## 2014-06-25 DIAGNOSIS — E785 Hyperlipidemia, unspecified: Secondary | ICD-10-CM

## 2014-06-25 DIAGNOSIS — Z79899 Other long term (current) drug therapy: Secondary | ICD-10-CM

## 2014-06-25 DIAGNOSIS — G4700 Insomnia, unspecified: Secondary | ICD-10-CM

## 2014-06-25 LAB — CBC WITH DIFFERENTIAL/PLATELET
BASOS ABS: 0.1 10*3/uL (ref 0.0–0.1)
Basophils Relative: 1 % (ref 0–1)
Eosinophils Absolute: 0.2 10*3/uL (ref 0.0–0.7)
Eosinophils Relative: 3 % (ref 0–5)
HEMATOCRIT: 37.5 % (ref 36.0–46.0)
Hemoglobin: 12.2 g/dL (ref 12.0–15.0)
LYMPHS PCT: 39 % (ref 12–46)
Lymphs Abs: 3.2 10*3/uL (ref 0.7–4.0)
MCH: 28 pg (ref 26.0–34.0)
MCHC: 32.5 g/dL (ref 30.0–36.0)
MCV: 86 fL (ref 78.0–100.0)
MPV: 10.3 fL (ref 9.4–12.4)
Monocytes Absolute: 0.4 10*3/uL (ref 0.1–1.0)
Monocytes Relative: 5 % (ref 3–12)
NEUTROS ABS: 4.3 10*3/uL (ref 1.7–7.7)
Neutrophils Relative %: 52 % (ref 43–77)
PLATELETS: 253 10*3/uL (ref 150–400)
RBC: 4.36 MIL/uL (ref 3.87–5.11)
RDW: 14.3 % (ref 11.5–15.5)
WBC: 8.3 10*3/uL (ref 4.0–10.5)

## 2014-06-25 MED ORDER — ZOLPIDEM TARTRATE 5 MG PO TABS: 5.0000 mg | ORAL_TABLET | Freq: Every day | ORAL | Status: DC

## 2014-06-25 NOTE — Progress Notes (Signed)
Assessment and Plan:  Hypertension: Continue medication, monitor blood pressure at home. Continue DASH diet.  Reminder to go to the ER if any CP, SOB, nausea, dizziness, severe HA, changes vision/speech, left arm numbness and tingling, and jaw pain. Cholesterol: Continue diet and exercise. Check cholesterol.  Pre-diabetes-Continue diet and exercise. Check A1C Vitamin D Def- check level and continue medications.  Insomnia- ambien 5mg   Send labs to Dr. Salena Saner, lipids and CMET Continue diet and meds as discussed. Further disposition pending results of labs.  HPI 63 y.o. female  presents for 3 month follow up with hypertension, hyperlipidemia, prediabetes and vitamin D. Her blood pressure has been controlled at home, today their BP is BP: 132/82 mmHg She does workou as much as she can has been walking since the procedure in Aug. She denies chest pain, shortness of breath, dizziness.  She is on cholesterol medication, pravastatin 40mg  and denies myalgias. Her cholesterol is at goal. The cholesterol last visit was:   Lab Results  Component Value Date   CHOL 148 01/03/2014   HDL 61 01/03/2014   LDLCALC 76 01/03/2014   TRIG 57 01/03/2014   CHOLHDL 2.4 01/03/2014   She has been working on diet and exercise for prediabetes, and denies paresthesia of the feet, polydipsia, polyuria and visual disturbances. Last A1C in the office was:  Lab Results  Component Value Date   HGBA1C 5.7* 01/03/2014   Patient is on Vitamin D supplement.   Lab Results  Component Value Date   VD25OH 104* 08/29/2013     Recent right knee surgery with Dr. Shelle IronBeane in August, states she is doing well.  BMI is Body mass index is 32.62 kg/(m^2)., she is working on diet and exercise. Wt Readings from Last 3 Encounters:  06/25/14 196 lb (88.905 kg)  06/20/14 195 lb (88.451 kg)  02/28/14 191 lb (86.637 kg)   She follows with Dr. Salena Saner, had history of catheretization that is normal.  She has been on the [place] 10[place]  and does well on  this however due to the FDA, she is on the 5mg  and does not do as well. Has tried besomra, requip, lunesta without help.   Current Medications:  Current Outpatient Prescriptions on File Prior to Visit  Medication Sig Dispense Refill  . acitretin (SORIATANE) 25 MG capsule Take 25 mg by mouth every Monday, Wednesday, and Friday.    . cetirizine (ZYRTEC) 10 MG tablet Take 10 mg by mouth daily.    . Cholecalciferol (VITAMIN D3) 5000 UNITS CAPS Take 5,000 Units by mouth every Monday, Wednesday, and Friday.    . diazepam (VALIUM) 10 MG tablet Take 1 tablet (10 mg total) by mouth every 6 (six) hours as needed for anxiety. 90 tablet 0  . naproxen (NAPROSYN) 500 MG tablet Take 1 tablet by mouth as needed.    Marland Kitchen. oxyCODONE-acetaminophen (PERCOCET) 7.5-325 MG per tablet Take 1 tablet by mouth every 4 (four) hours as needed for pain. 60 tablet 0  . pantoprazole (PROTONIX) 40 MG tablet Take 1 tablet (40 mg total) by mouth daily. 90 tablet 0  . pravastatin (PRAVACHOL) 40 MG tablet Take 1 tablet (40 mg total) by mouth at bedtime. 90 tablet 0  . triamterene-hydrochlorothiazide (MAXZIDE-25) 37.5-25 MG per tablet Take 1 tablet by mouth at bedtime. 90 tablet 0  . zolpidem (AMBIEN) 5 MG tablet TAKE ONE TABLET BY MOUTH ONCE DAILY AT BEDTIME 15 tablet 0   No current facility-administered medications on file prior to visit.   Medical History:  Past Medical History  Diagnosis Date  . Hypertension   . Hyperlipidemia   . GERD (gastroesophageal reflux disease)   . Unspecified vitamin D deficiency   . Obese   . Family history of anesthesia complication     "mother quit breathing" 30 yrs ago pt does not know any more details. Pt has never had any problems with anesthesia herself  . DJD (degenerative joint disease)   . Psoriasis     HANDS  . Urgency of urination   . Difficulty sleeping     takes Ambien  . Depression   . Anxiety    Allergies:  Allergies  Allergen Reactions  . Levaquin [Levofloxacin In D5w]      yeast  . Oruvail [Ketoprofen]     GI upset     Review of Systems:  ROS   Family history- Review and unchanged Social history- Review and unchanged Physical Exam: BP 132/82 mmHg  Pulse 76  Temp(Src) 97.9 F (36.6 C)  Resp 16  Ht 5\' 5"  (1.651 m)  Wt 196 lb (88.905 kg)  BMI 32.62 kg/m2 Wt Readings from Last 3 Encounters:  06/25/14 196 lb (88.905 kg)  06/20/14 195 lb (88.451 kg)  02/28/14 191 lb (86.637 kg)   General Appearance: Well nourished, in no apparent distress. Eyes: PERRLA, EOMs, conjunctiva no swelling or erythema Sinuses: No Frontal/maxillary tenderness ENT/Mouth: Ext aud canals clear, TMs without erythema, bulging. No erythema, swelling, or exudate on post pharynx.  Tonsils not swollen or erythematous. Hearing normal.  Neck: Supple, thyroid normal.  Respiratory: Respiratory effort normal, BS equal bilaterally without rales, rhonchi, wheezing or stridor.  Cardio: RRR with no MRGs. Brisk peripheral pulses without edema.  Abdomen: Soft, + BS.  Non tender, no guarding, rebound, hernias, masses. Lymphatics: Non tender without lymphadenopathy.  Musculoskeletal: Full ROM, 5/5 strength, normal gait.  Skin: Warm, dry without rashes, lesions, ecchymosis.  Neuro: Cranial nerves intact. Normal muscle tone, no cerebellar symptoms. Sensation intact.  Psych: Awake and oriented X 3, normal affect, Insight and Judgment appropriate.    Quentin Mullingollier, Majour Frei, PA-C 4:45 PM [facility]

## 2014-06-25 NOTE — Patient Instructions (Signed)

## 2014-06-26 LAB — HEPATIC FUNCTION PANEL
ALK PHOS: 55 U/L (ref 39–117)
ALT: 12 U/L (ref 0–35)
AST: 15 U/L (ref 0–37)
Albumin: 4 g/dL (ref 3.5–5.2)
BILIRUBIN INDIRECT: 0.2 mg/dL (ref 0.2–1.2)
BILIRUBIN TOTAL: 0.3 mg/dL (ref 0.2–1.2)
Bilirubin, Direct: 0.1 mg/dL (ref 0.0–0.3)
TOTAL PROTEIN: 6.1 g/dL (ref 6.0–8.3)

## 2014-06-26 LAB — HEMOGLOBIN A1C
Hgb A1c MFr Bld: 5.7 % — ABNORMAL HIGH (ref <5.7)
Mean Plasma Glucose: 117 mg/dL — ABNORMAL HIGH (ref <117)

## 2014-06-26 LAB — LIPID PANEL
CHOL/HDL RATIO: 2.5 ratio
Cholesterol: 165 mg/dL (ref 0–200)
HDL: 67 mg/dL (ref >39)
LDL CALC: 85 mg/dL (ref 0–99)
Triglycerides: 66 mg/dL (ref <150)
VLDL: 13 mg/dL (ref 0–40)

## 2014-06-26 LAB — BASIC METABOLIC PANEL WITH GFR
BUN: 35 mg/dL — AB (ref 6–23)
CHLORIDE: 105 meq/L (ref 96–112)
CO2: 25 meq/L (ref 19–32)
Calcium: 8.9 mg/dL (ref 8.4–10.5)
Creat: 0.92 mg/dL (ref 0.50–1.10)
GFR, EST NON AFRICAN AMERICAN: 67 mL/min
GFR, Est African American: 77 mL/min
Glucose, Bld: 78 mg/dL (ref 70–99)
POTASSIUM: 4.1 meq/L (ref 3.5–5.3)
Sodium: 140 mEq/L (ref 135–145)

## 2014-06-26 LAB — TSH: TSH: 2.115 u[IU]/mL (ref 0.350–4.500)

## 2014-06-26 LAB — VITAMIN D 25 HYDROXY (VIT D DEFICIENCY, FRACTURES): Vit D, 25-Hydroxy: 63 ng/mL (ref 30–100)

## 2014-06-26 LAB — INSULIN, FASTING: Insulin fasting, serum: 2.2 u[IU]/mL (ref 2.0–19.6)

## 2014-06-26 LAB — MAGNESIUM: Magnesium: 1.9 mg/dL (ref 1.5–2.5)

## 2014-07-15 ENCOUNTER — Other Ambulatory Visit: Payer: Self-pay | Admitting: Cardiovascular Disease

## 2014-07-15 NOTE — Telephone Encounter (Signed)
Rx refill sent to patient pharmacy   

## 2014-08-28 ENCOUNTER — Other Ambulatory Visit: Payer: Self-pay | Admitting: Cardiovascular Disease

## 2014-08-28 ENCOUNTER — Other Ambulatory Visit: Payer: Self-pay | Admitting: Internal Medicine

## 2014-08-28 NOTE — Telephone Encounter (Signed)
Rx refill sent to patient pharmacy   

## 2014-08-29 ENCOUNTER — Encounter: Payer: Self-pay | Admitting: Emergency Medicine

## 2014-09-26 ENCOUNTER — Ambulatory Visit (INDEPENDENT_AMBULATORY_CARE_PROVIDER_SITE_OTHER): Payer: BLUE CROSS/BLUE SHIELD | Admitting: Internal Medicine

## 2014-09-26 ENCOUNTER — Encounter: Payer: Self-pay | Admitting: Internal Medicine

## 2014-09-26 VITALS — BP 146/88 | HR 82 | Temp 98.2°F | Resp 18 | Ht 65.0 in | Wt 198.0 lb

## 2014-09-26 DIAGNOSIS — E559 Vitamin D deficiency, unspecified: Secondary | ICD-10-CM

## 2014-09-26 DIAGNOSIS — R7309 Other abnormal glucose: Secondary | ICD-10-CM

## 2014-09-26 DIAGNOSIS — Z79899 Other long term (current) drug therapy: Secondary | ICD-10-CM

## 2014-09-26 DIAGNOSIS — R7303 Prediabetes: Secondary | ICD-10-CM

## 2014-09-26 DIAGNOSIS — E785 Hyperlipidemia, unspecified: Secondary | ICD-10-CM

## 2014-09-26 DIAGNOSIS — I10 Essential (primary) hypertension: Secondary | ICD-10-CM

## 2014-09-26 DIAGNOSIS — J309 Allergic rhinitis, unspecified: Secondary | ICD-10-CM

## 2014-09-26 LAB — CBC WITH DIFFERENTIAL/PLATELET
Basophils Absolute: 0 10*3/uL (ref 0.0–0.1)
Basophils Relative: 0 % (ref 0–1)
Eosinophils Absolute: 0.3 10*3/uL (ref 0.0–0.7)
Eosinophils Relative: 3 % (ref 0–5)
HCT: 40.2 % (ref 36.0–46.0)
Hemoglobin: 12.9 g/dL (ref 12.0–15.0)
LYMPHS ABS: 3.5 10*3/uL (ref 0.7–4.0)
Lymphocytes Relative: 37 % (ref 12–46)
MCH: 28.7 pg (ref 26.0–34.0)
MCHC: 32.1 g/dL (ref 30.0–36.0)
MCV: 89.5 fL (ref 78.0–100.0)
MPV: 10.6 fL (ref 8.6–12.4)
Monocytes Absolute: 0.6 10*3/uL (ref 0.1–1.0)
Monocytes Relative: 6 % (ref 3–12)
NEUTROS PCT: 54 % (ref 43–77)
Neutro Abs: 5.1 10*3/uL (ref 1.7–7.7)
Platelets: 258 10*3/uL (ref 150–400)
RBC: 4.49 MIL/uL (ref 3.87–5.11)
RDW: 13.8 % (ref 11.5–15.5)
WBC: 9.4 10*3/uL (ref 4.0–10.5)

## 2014-09-26 MED ORDER — DIAZEPAM 10 MG PO TABS: 10.0000 mg | ORAL_TABLET | Freq: Four times a day (QID) | ORAL | Status: DC | PRN

## 2014-09-26 MED ORDER — HYDROCOD POLST-CHLORPHEN POLST 10-8 MG/5ML PO LQCR: 5.0000 mL | Freq: Two times a day (BID) | ORAL | Status: DC

## 2014-09-26 MED ORDER — PREDNISONE 20 MG PO TABS: ORAL_TABLET | ORAL | Status: DC

## 2014-09-26 MED ORDER — AZITHROMYCIN 250 MG PO TABS: ORAL_TABLET | ORAL | Status: DC

## 2014-09-26 NOTE — Progress Notes (Signed)
Patient ID: Jade Singh, female   DOB: 02/23/1951, 64 y.o.   MRN: [id number]  Assessment and Plan:  Hypertension: Elevated but likely due to anxiety and being out of valium -Continue medication,  -monitor blood pressure at home.  -Continue DASH diet.   -Reminder to go to the ER if any CP, SOB, nausea, dizziness, severe HA, changes vision/speech, left arm numbness and tingling, and jaw pain.  Cholesterol: -Continue diet and exercise.  -Check cholesterol.   Pre-diabetes: -Continue diet and exercise.  -Check A1C  Vitamin D Def: -check level -continue medications.   Allergic rhinitis Lack of antibiotic effectiveness discussed. Allergies are a consideration if symptoms persist. I recommended prednisone and nasal saline.  If no better in 3 days z pak given  Continue diet and meds as discussed. Further disposition pending results of labs.  HPI 64 y.o. female  presents for 3 month follow up with hypertension, hyperlipidemia, prediabetes and vitamin D.   Her blood pressure has not been controlled at home, today their BP is BP: (!) 146/88 mmHg.   She does workout. She denies chest pain, shortness of breath, dizziness.  She reports that it has been slightly elevated at home, but she has also been more anxious and has been out of her diazepam.     She is on cholesterol medication and denies myalgias. Her cholesterol is at goal. The cholesterol last visit was:   Lab Results  Component Value Date   CHOL 165 06/25/2014   HDL 67 06/25/2014   LDLCALC 85 06/25/2014   TRIG 66 06/25/2014   CHOLHDL 2.5 06/25/2014     She has been working on diet and exercise for prediabetes, and denies foot ulcerations, increased appetite, nausea, paresthesia of the feet, polydipsia, polyuria and visual disturbances. Last A1C in the office was:  Lab Results  Component Value Date   HGBA1C 5.7* 06/25/2014    Patient is on Vitamin D supplement.  Lab Results  Component Value Date   VD25OH 63 06/25/2014      The patient also wishes to address some mild URI symptoms of congestion, nasal stuffiness, cough for the past few days without fever. Exam shows normal ENT, normal chest. This is likely a viral URI vs. Allergic rhinitis.    Current Medications:  Current Outpatient Prescriptions on File Prior to Visit  Medication Sig Dispense Refill  . acitretin (SORIATANE) 25 MG capsule Take 25 mg by mouth every Monday, Wednesday, and Friday.    . cetirizine (ZYRTEC) 10 MG tablet Take 10 mg by mouth daily.    . Cholecalciferol (VITAMIN D3) 5000 UNITS CAPS Take 5,000 Units by mouth every Monday, Wednesday, and Friday.    . diazepam (VALIUM) 10 MG tablet Take 1 tablet (10 mg total) by mouth every 6 (six) hours as needed for anxiety. 90 tablet 0  . naproxen (NAPROSYN) 500 MG tablet Take 1 tablet by mouth as needed.    Marland Kitchen. oxyCODONE-acetaminophen (PERCOCET) 7.5-325 MG per tablet Take 1 tablet by mouth every 4 (four) hours as needed for pain. 60 tablet 0  . pantoprazole (PROTONIX) 40 MG tablet TAKE 1 TABLET DAILY 90 tablet 3  . pravastatin (PRAVACHOL) 40 MG tablet TAKE 1 TABLET AT BEDTIME 90 tablet 1  . triamterene-hydrochlorothiazide (MAXZIDE-25) 37.5-25 MG per tablet TAKE ONE TABLET BY MOUTH AT BEDTIME 90 tablet 3  . zolpidem (AMBIEN) 5 MG tablet Take 1 tablet (5 mg total) by mouth daily with breakfast. 90 tablet 1   No current facility-administered medications on file  prior to visit.    Medical History:  Past Medical History  Diagnosis Date  . Hypertension   . Hyperlipidemia   . GERD (gastroesophageal reflux disease)   . Unspecified vitamin D deficiency   . Obese   . Family history of anesthesia complication     "mother quit breathing" 30 yrs ago pt does not know any more details. Pt has never had any problems with anesthesia herself  . DJD (degenerative joint disease)   . Psoriasis     HANDS  . Urgency of urination   . Difficulty sleeping     takes Ambien  . Depression   . Anxiety      Allergies:  Allergies  Allergen Reactions  . Levaquin [Levofloxacin In D5w]     yeast  . Oruvail [Ketoprofen]     GI upset     Review of Systems:  Review of Systems  Constitutional: Negative for fever, chills, malaise/fatigue and diaphoresis.  HENT: Positive for congestion and sore throat. Negative for ear discharge, ear pain and tinnitus.   Eyes: Negative.   Respiratory: Positive for cough. Negative for sputum production, shortness of breath and wheezing.   Cardiovascular: Negative for chest pain and leg swelling.  Gastrointestinal: Positive for constipation (chronic). Negative for nausea, vomiting, abdominal pain, diarrhea, blood in stool and melena.  Genitourinary: Negative.   Musculoskeletal: Positive for myalgias.  Skin: Negative.   Neurological: Negative.  Negative for weakness and headaches.  Psychiatric/Behavioral: Negative.     Family history- Review and unchanged  Social history- Review and unchanged  Physical Exam: BP 146/88 mmHg  Pulse 82  Temp(Src) 98.2 F (36.8 C) (Temporal)  Resp 18  Ht  (1.651 m)  Wt 198 lb (89.812 kg)  BMI 32.95 kg/m2 Wt Readings from Last 3 Encounters:  09/26/14 198 lb (89.812 kg)  06/25/14 196 lb (88.905 kg)  06/20/14 195 lb (88.451 kg)    General Appearance: Well nourished well developed, in no apparent distress. Eyes: PERRLA, EOMs, conjunctiva no swelling or erythema ENT/Mouth: Ear canals normal without obstruction, swelling, erythma, discharge.  TMs normal bilaterally.  Oropharynx moist, clear, without exudate, or postoropharyngeal swelling.  Nasal mucosal edema.   Neck: Supple, thyroid normal,no cervical adenopathy  Respiratory: Respiratory effort normal, Breath sounds clear A&P without rhonchi, wheeze, or rale.  No retractions, no accessory usage. Cardio: RRR with no MRGs. Brisk peripheral pulses without edema.  Abdomen: Soft, + BS,  Non tender, no guarding, rebound, hernias, masses. Musculoskeletal: Full ROM, 5/5  strength, Normal gait.  Midline surgical scar on the knee with minimal swelling.  Good ROM. Skin: Warm, dry without rashes, lesions, ecchymosis.  Neuro: Awake and oriented X 3, Cranial nerves intact. Normal muscle tone, no cerebellar symptoms. Psych: Normal affect, Insight and Judgment appropriate.    FORCUCCI, Harveen Flesch, PA-C 4:42 PM [facility]

## 2014-09-26 NOTE — Patient Instructions (Signed)
Recommend the book "The END of DIETING" by Dr Monico HoarJoel Fuhrman   & the book "The END of DIABETES " by Dr Monico HoarJoel Fuhrman  At [email or web address] - get book & Audio CD's      Being diabetic has a  300% increased risk for heart attack, stroke, cancer, and alzheimer- type vascular dementia. It is very important that you work harder with diet by avoiding all foods that are white. Avoid white rice (brown & wild rice is OK), white potatoes (sweetpotatoes in moderation is OK), White bread or wheat bread or anything made out of white flour like bagels, donuts, rolls, buns, biscuits, cakes, pastries, cookies, pizza crust, and pasta (made from white flour & egg whites) - vegetarian pasta or spinach or wheat pasta is OK. Multigrain breads like Arnold's or [employer], or multigrain sandwich thins or flatbreads.  Diet, exercise and weight loss can reverse and cure diabetes in the early stages.  Diet, exercise and weight loss is very important in the control and prevention of complications of diabetes which affects every system in your body, ie. Brain - dementia/stroke, eyes - glaucoma/blindness, heart - heart attack/heart failure, kidneys - dialysis, stomach - gastric paralysis, intestines - malabsorption, nerves - severe painful neuritis, circulation - gangrene & loss of a leg(s), and finally cancer and Alzheimers.    I recommend avoid fried & greasy foods,  sweets/candy, white rice (brown or wild rice or Quinoa is OK), white potatoes (sweet potatoes are OK) - anything made from white flour - bagels, doughnuts, rolls, buns, biscuits,white and wheat breads, pizza crust and traditional pasta made of white flour & egg white(vegetarian pasta or spinach or wheat pasta is OK).  Multi-grain bread is OK - like multi-grain flat bread or sandwich thins. Avoid alcohol in excess. Exercise is also important.    Eat all the vegetables you want - avoid meat, especially red meat and dairy - especially cheese.  Cheese is the most  concentrated form of trans-fats which is the worst thing to clog up our arteries. Veggie cheese is OK which can be found in the fresh produce section at [employer] or [employer] or [employer]   Allergic Rhinitis Allergic rhinitis is when the mucous membranes in the nose respond to allergens. Allergens are particles in the air that cause your body to have an allergic reaction. This causes you to release allergic antibodies. Through a chain of events, these eventually cause you to release histamine into the blood stream. Although meant to protect the body, it is this release of histamine that causes your discomfort, such as frequent sneezing, congestion, and an itchy, runny nose.  CAUSES  Seasonal allergic rhinitis (hay fever) is caused by pollen allergens that may come from grasses, trees, and weeds. Year-round allergic rhinitis (perennial allergic rhinitis) is caused by allergens such as house dust mites, pet dander, and mold spores.  SYMPTOMS   Nasal stuffiness (congestion).  Itchy, runny nose with sneezing and tearing of the eyes. DIAGNOSIS  Your health care provider can help you determine the allergen or allergens that trigger your symptoms. If you and your health care provider are unable to determine the allergen, skin or blood testing may be used. TREATMENT  Allergic rhinitis does not have a cure, but it can be controlled by:  Medicines and allergy shots (immunotherapy).  Avoiding the allergen. Hay fever may often be treated with antihistamines in pill or nasal spray forms. Antihistamines block the effects of histamine. There are over-the-counter medicines that may help  with nasal congestion and swelling around the eyes. Check with your health care provider before taking or giving this medicine.  If avoiding the allergen or the medicine prescribed do not work, there are many new medicines your health care provider can prescribe. Stronger medicine may be used if initial measures are  ineffective. Desensitizing injections can be used if medicine and avoidance does not work. Desensitization is when a patient is given ongoing shots until the body becomes less sensitive to the allergen. Make sure you follow up with your health care provider if problems continue. HOME CARE INSTRUCTIONS It is not possible to completely avoid allergens, but you can reduce your symptoms by taking steps to limit your exposure to them. It helps to know exactly what you are allergic to so that you can avoid your specific triggers. SEEK MEDICAL CARE IF:   You have a fever.  You develop a cough that does not stop easily (persistent).  You have shortness of breath.  You start wheezing.  Symptoms interfere with normal daily activities. Document Released: 03/30/2001 Document Revised: 07/10/2013 Document Reviewed: 03/12/2013 [facility] Patient Information 2015 [place], [place]. This information is not intended to replace advice given to you by your health care provider. Make sure you discuss any questions you have with your health care provider.

## 2014-09-27 LAB — LIPID PANEL
CHOL/HDL RATIO: 2.4 ratio
Cholesterol: 171 mg/dL (ref 0–200)
HDL: 71 mg/dL (ref >=46)
LDL Cholesterol: 85 mg/dL (ref 0–99)
TRIGLYCERIDES: 74 mg/dL (ref <150)
VLDL: 15 mg/dL (ref 0–40)

## 2014-09-27 LAB — BASIC METABOLIC PANEL WITH GFR
BUN: 32 mg/dL — ABNORMAL HIGH (ref 6–23)
CHLORIDE: 107 meq/L (ref 96–112)
CO2: 29 mEq/L (ref 19–32)
CREATININE: 0.68 mg/dL (ref 0.50–1.10)
Calcium: 9.4 mg/dL (ref 8.4–10.5)
GFR, Est Non African American: >89 mL/min
Glucose, Bld: 87 mg/dL (ref 70–99)
POTASSIUM: 4.3 meq/L (ref 3.5–5.3)
SODIUM: 143 meq/L (ref 135–145)

## 2014-09-27 LAB — HEMOGLOBIN A1C
Hgb A1c MFr Bld: 5.6 % (ref <5.7)
Mean Plasma Glucose: 114 mg/dL (ref <117)

## 2014-09-27 LAB — VITAMIN D 25 HYDROXY (VIT D DEFICIENCY, FRACTURES): Vit D, 25-Hydroxy: 68 ng/mL (ref 30–100)

## 2014-09-27 LAB — TSH: TSH: 1.779 u[IU]/mL (ref 0.350–4.500)

## 2014-09-27 LAB — HEPATIC FUNCTION PANEL
ALBUMIN: 4.2 g/dL (ref 3.5–5.2)
ALK PHOS: 56 U/L (ref 39–117)
ALT: 16 U/L (ref 0–35)
AST: 14 U/L (ref 0–37)
BILIRUBIN INDIRECT: 0.2 mg/dL (ref 0.2–1.2)
Bilirubin, Direct: 0.1 mg/dL (ref 0.0–0.3)
Total Bilirubin: 0.3 mg/dL (ref 0.2–1.2)
Total Protein: 6.3 g/dL (ref 6.0–8.3)

## 2014-09-27 LAB — MAGNESIUM: MAGNESIUM: 2 mg/dL (ref 1.5–2.5)

## 2014-09-27 LAB — INSULIN, FASTING: Insulin fasting, serum: 10.7 u[IU]/mL (ref 2.0–19.6)

## 2014-10-31 ENCOUNTER — Encounter: Payer: Self-pay | Admitting: Emergency Medicine

## 2014-10-31 ENCOUNTER — Ambulatory Visit (INDEPENDENT_AMBULATORY_CARE_PROVIDER_SITE_OTHER): Payer: BLUE CROSS/BLUE SHIELD | Admitting: Emergency Medicine

## 2014-10-31 VITALS — BP 132/82 | HR 76 | Temp 97.7°F | Resp 16 | Ht 65.0 in | Wt 202.0 lb

## 2014-10-31 DIAGNOSIS — I10 Essential (primary) hypertension: Secondary | ICD-10-CM

## 2014-10-31 DIAGNOSIS — K5790 Diverticulosis of intestine, part unspecified, without perforation or abscess without bleeding: Secondary | ICD-10-CM | POA: Insufficient documentation

## 2014-10-31 DIAGNOSIS — J301 Allergic rhinitis due to pollen: Secondary | ICD-10-CM

## 2014-10-31 DIAGNOSIS — Z0001 Encounter for general adult medical examination with abnormal findings: Secondary | ICD-10-CM

## 2014-10-31 DIAGNOSIS — E782 Mixed hyperlipidemia: Secondary | ICD-10-CM

## 2014-10-31 DIAGNOSIS — G4700 Insomnia, unspecified: Secondary | ICD-10-CM

## 2014-10-31 DIAGNOSIS — Z79899 Other long term (current) drug therapy: Secondary | ICD-10-CM

## 2014-10-31 DIAGNOSIS — R799 Abnormal finding of blood chemistry, unspecified: Secondary | ICD-10-CM

## 2014-10-31 DIAGNOSIS — Z0000 Encounter for general adult medical examination without abnormal findings: Secondary | ICD-10-CM

## 2014-10-31 DIAGNOSIS — Z136 Encounter for screening for cardiovascular disorders: Secondary | ICD-10-CM

## 2014-10-31 DIAGNOSIS — E559 Vitamin D deficiency, unspecified: Secondary | ICD-10-CM

## 2014-10-31 DIAGNOSIS — Z1212 Encounter for screening for malignant neoplasm of rectum: Secondary | ICD-10-CM

## 2014-10-31 DIAGNOSIS — R6889 Other general symptoms and signs: Secondary | ICD-10-CM

## 2014-10-31 LAB — BASIC METABOLIC PANEL WITH GFR
BUN: 23 mg/dL (ref 6–23)
CALCIUM: 9.1 mg/dL (ref 8.4–10.5)
CO2: 27 mEq/L (ref 19–32)
Chloride: 104 mEq/L (ref 96–112)
Creat: 0.97 mg/dL (ref 0.50–1.10)
GFR, EST AFRICAN AMERICAN: 72 mL/min
GFR, EST NON AFRICAN AMERICAN: 63 mL/min
GLUCOSE: 85 mg/dL (ref 70–99)
POTASSIUM: 4.2 meq/L (ref 3.5–5.3)
SODIUM: 140 meq/L (ref 135–145)

## 2014-10-31 MED ORDER — ZOLPIDEM TARTRATE 5 MG PO TABS: 5.0000 mg | ORAL_TABLET | Freq: Every day | ORAL | Status: DC

## 2014-10-31 MED ORDER — FLUTICASONE PROPIONATE 50 MCG/ACT NA SUSP: 1.0000 | Freq: Every day | NASAL | Status: DC

## 2014-10-31 MED ORDER — DIAZEPAM 10 MG PO TABS: 10.0000 mg | ORAL_TABLET | Freq: Four times a day (QID) | ORAL | Status: DC | PRN

## 2014-10-31 NOTE — Patient Instructions (Signed)
Add protein 1 hour before bed. Increase folic acid intake.   Insomnia Insomnia is frequent trouble falling and/or staying asleep. Insomnia can be a long term problem or a short term problem. Both are common. Insomnia can be a short term problem when the wakefulness is related to a certain stress or worry. Long term insomnia is often related to ongoing stress during waking hours and/or poor sleeping habits. Overtime, sleep deprivation itself can make the problem worse. Every little thing feels more severe because you are overtired and your ability to cope is decreased. CAUSES   Stress, anxiety, and depression.  Poor sleeping habits.  Distractions such as TV in the bedroom.  Naps close to bedtime.  Engaging in emotionally charged conversations before bed.  Technical reading before sleep.  Alcohol and other sedatives. They may make the problem worse. They can hurt normal sleep patterns and normal dream activity.  Stimulants such as caffeine for several hours prior to bedtime.  Pain syndromes and shortness of breath can cause insomnia.  Exercise late at night.  Changing time zones may cause sleeping problems (jet lag). It is sometimes helpful to have someone observe your sleeping patterns. They should look for periods of not breathing during the night (sleep apnea). They should also look to see how long those periods last. If you live alone or observers are uncertain, you can also be observed at a sleep clinic where your sleep patterns will be professionally monitored. Sleep apnea requires a checkup and treatment. Give your caregivers your medical history. Give your caregivers observations your family has made about your sleep.  SYMPTOMS   Not feeling rested in the morning.  Anxiety and restlessness at bedtime.  Difficulty falling and staying asleep. TREATMENT   Your caregiver may prescribe treatment for an underlying medical disorders. Your caregiver can give advice or help if you  are using alcohol or other drugs for self-medication. Treatment of underlying problems will usually eliminate insomnia problems.  Medications can be prescribed for short time use. They are generally not recommended for lengthy use.  Over-the-counter sleep medicines are not recommended for lengthy use. They can be habit forming.  You can promote easier sleeping by making lifestyle changes such as:  Using relaxation techniques that help with breathing and reduce muscle tension.  Exercising earlier in the day.  Changing your diet and the time of your last meal. No night time snacks.  Establish a regular time to go to bed.  Counseling can help with stressful problems and worry.  Soothing music and white noise may be helpful if there are background noises you cannot remove.  Stop tedious detailed work at least one hour before bedtime. HOME CARE INSTRUCTIONS   Keep a diary. Inform your caregiver about your progress. This includes any medication side effects. See your caregiver regularly. Take note of:  Times when you are asleep.  Times when you are awake during the night.  The quality of your sleep.  How you feel the next day. This information will help your caregiver care for you.  Get out of bed if you are still awake after 15 minutes. Read or do some quiet activity. Keep the lights down. Wait until you feel sleepy and go back to bed.  Keep regular sleeping and waking hours. Avoid naps.  Exercise regularly.  Avoid distractions at bedtime. Distractions include watching television or engaging in any intense or detailed activity like attempting to balance the household checkbook.  Develop a bedtime ritual. Keep a familiar  routine of bathing, brushing your teeth, climbing into bed at the same time each night, listening to soothing music. Routines increase the success of falling to sleep faster.  Use relaxation techniques. This can be using breathing and muscle tension release  routines. It can also include visualizing peaceful scenes. You can also help control troubling or intruding thoughts by keeping your mind occupied with boring or repetitive thoughts like the old concept of counting sheep. You can make it more creative like imagining planting one beautiful flower after another in your backyard garden.  During your day, work to eliminate stress. When this is not possible use some of the previous suggestions to help reduce the anxiety that accompanies stressful situations. MAKE SURE YOU:   Understand these instructions.  Will watch your condition.  Will get help right away if you are not doing well or get worse. Document Released: 07/02/2000 Document Revised: 09/27/2011 Document Reviewed: 08/02/2007 [facility] Patient Information 2015 [place], [place]. This information is not intended to replace advice given to you by your health care provider. Make sure you discuss any questions you have with your health care provider.

## 2014-10-31 NOTE — Progress Notes (Signed)
Subjective:    Patient ID: Jade Singh, female    DOB: May 10, 1951, 63 y.o.   MRN: [id number]  HPI Comments: 64 yo WF CPE with HTN, Cholesterol, Pre-Dm, D. deficient history. She has checked BP and good at home. Denies CV Complaints. She take cholesterol/ HTN RX AD. She had recent labs completed. She has been trying to improve diet but has limited exercise with knee surgery.  She had lost 50 lbs with weight watchers but had gained some back with increased stress/ depression. She notes depression has improved with improved use of knee after surgery.   She uses Ambien for sleep QHS. Sleeps well with Ambien and denies adverse side effects.  She uses Valium for stress/ anxiety rarely. She does not take the two medicines together or with ETOH. She rarely uses OXY for knee pain and does not take with Ambien/ Valium.  Mild increase allergy drainage. Denies cough/ SOB/ Fever.   Lab Results      Component                Value               Date                      WBC                      9.4                 09/26/2014                HGB                      12.9                09/26/2014                HCT                      40.2                09/26/2014                PLT                      258                 09/26/2014                GLUCOSE                  87                  09/26/2014                CHOL                     171                 09/26/2014                TRIG                     74                  09/26/2014  HDL                      71                  09/26/2014                LDLCALC                  85                  09/26/2014                ALT                      16                  09/26/2014                AST                      14                  09/26/2014                NA                       143                 09/26/2014                K                        4.3                 09/26/2014                CL                        107                 09/26/2014                CREATININE               0.68                09/26/2014                BUN                      32*                 09/26/2014                CO2                      29                  09/26/2014                TSH                      1.779               09/26/2014  INR                      1.01                02/21/2014                HGBA1C                   5.6                 09/26/2014                MICROALBUR               0.50                08/29/2013               Medication List       This list is accurate as of: 10/31/14  3:56 PM.  Always use your most recent med list.               acitretin 25 MG capsule  Commonly known as:  SORIATANE  Take 25 mg by mouth every Monday, Wednesday, and Friday.     cetirizine 10 MG tablet  Commonly known as:  ZYRTEC  Take 10 mg by mouth daily.     diazepam 10 MG tablet  Commonly known as:  VALIUM  Take 1 tablet (10 mg total) by mouth every 6 (six) hours as needed for anxiety.     naproxen 500 MG tablet  Commonly known as:  NAPROSYN  Take 1 tablet by mouth as needed.     oxyCODONE-acetaminophen 7.5-325 MG per tablet  Commonly known as:  PERCOCET  Take 1 tablet by mouth every 4 (four) hours as needed for pain.     pantoprazole 40 MG tablet  Commonly known as:  PROTONIX  TAKE 1 TABLET DAILY     pravastatin 40 MG tablet  Commonly known as:  PRAVACHOL  TAKE 1 TABLET AT BEDTIME     triamterene-hydrochlorothiazide 37.5-25 MG per tablet  Commonly known as:  MAXZIDE-25  TAKE ONE TABLET BY MOUTH AT BEDTIME     Vitamin D3 5000 UNITS Caps  Take 5,000 Units by mouth every Monday, Wednesday, and Friday.     zolpidem 5 MG tablet  Commonly known as:  AMBIEN  Take 1 tablet (5 mg total) by mouth daily with breakfast.       Allergies  Allergen Reactions  . Levaquin [Levofloxacin In D5w]     yeast  . Oruvail [Ketoprofen]     GI upset   Past Medical History   Diagnosis Date  . Hypertension   . Hyperlipidemia   . GERD (gastroesophageal reflux disease)   . Unspecified vitamin D deficiency   . Obese   . Family history of anesthesia complication     "mother quit breathing" 30 yrs ago pt does not know any more details. Pt has never had any problems with anesthesia herself  . DJD (degenerative joint disease)   . Psoriasis     HANDS  . Urgency of urination   . Difficulty sleeping     takes Ambien  . Depression   . Anxiety   . Diverticulosis 5/1/5   Past Surgical History  Procedure Laterality Date  . Knee arthroscopy      RT 2011, LT 2009  . Appendectomy    . Tubal ligation    .  Abdominal hysterectomy      partial  . Tonsillectomy and adenoidectomy    . Cardiac catheterization  2005     no problems per pt   . 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 Smoking status: Former Smoker    Quit date: 07/19/2000  . Smokeless tobacco: Never Used  . Alcohol Use: Yes     Comment: occ   Family History  Problem Relation Age of Onset  . Stroke Mother   . Hyperlipidemia Mother   . Cancer Brother 66    prostate, deceased  . Diabetes Paternal Uncle   . Heart disease Paternal Grandfather   . Colon cancer Neg Hx    MAINTENANCE: Colonoscopy:11/16/13 due 2025 Mammo:12/20/13 BMD:12/09/11 WNL 2018 Pap/ Pelvic:2014 WNL NO ABNORMAL PAP HX WITH PARTIAL HYST due 2019 EYE:2015 WNl due 11/29/14 Dentist:10/28/14 WNL ECHO: 07/17/12 EF 55-65% CXR: 2015  IMMUNIZATIONS: Tdap:2010 Pneumovax:2014 Zostavax:2015 Influenza: 2015  Patient Care Team: [name], MD as PCP - General (Internal Medicine) Thurmon Fair, MD as Consulting Physician (Cardiology) Jene Every, MD as Consulting Physician (Orthopedic Surgery) [place], (Dentist)   Review of Systems  Constitutional:  Negative for fever and fatigue.  HENT: Positive for postnasal drip.   Respiratory: Negative for cough and shortness of breath.   Cardiovascular: Negative for chest pain.  Genitourinary: Negative for difficulty urinating.  Musculoskeletal: Positive for joint swelling.  Psychiatric/Behavioral: Negative for suicidal ideas.  All other systems reviewed and are negative.  BP 132/82 mmHg  Pulse 76  Temp(Src) 97.7 F (36.5 C)  Resp 16  Ht  (1.651 m)  Wt 202 lb (91.627 kg)  BMI 33.61 kg/m2     Objective:   Physical Exam  Constitutional: She is oriented to person, place, and time. She appears well-developed and well-nourished. No distress.  Overweight  HENT:  Head: Normocephalic.  Nose: Nose normal.  Mouth/Throat: Oropharynx is clear and moist.  Cloudy TM's bilaterally   Eyes: Conjunctivae and EOM are normal. Pupils are equal, round, and reactive to light. No scleral icterus.  Neck: Normal range of motion. Neck supple. No JVD present. No tracheal deviation present. No thyromegaly present.  Cardiovascular: Normal rate, regular rhythm, normal heart sounds and intact distal pulses.   Pulmonary/Chest: Effort normal and breath sounds normal.  Abdominal: Soft. Bowel sounds are normal. She exhibits no distension and no mass. There is no tenderness.  Genitourinary:  Breasts WNL GYN def  Musculoskeletal: Normal range of motion. She exhibits edema. She exhibits no tenderness.  Mild bilateral knee, walks with mild limp  Lymphadenopathy:    She has no cervical adenopathy.  Neurological: She is alert and oriented to person, place, and time. She has normal reflexes. No cranial nerve deficit. She exhibits normal muscle tone. Coordination normal.  Skin: Skin is warm and dry. No rash noted. No erythema.  Right posterior shoulder 3 mm flat medium dark irregular nevi  Psychiatric: She has a normal mood and affect. Her behavior is normal. Judgment and thought content normal.  Nursing note and  vitals reviewed.      AORTA SCAN WNL EKG NSCSPT  Assessment & Plan:  1. CPE- Update screening labs/ History/ Immunizations/ Testing as needed. Advised healthy diet, QD exercise, increase H20 and continue RX/ Vitamins AD. Reviewed recent labs.  2. MIld elevated BUN at last OV recheck with  HTN hx. Increase water. Decrease caffeine/ sodium  3.  HTN, Cholesterol, Pre-Dm, D. Deficient. Needs healthy diet, cardio QD and obtain healthy weight. Reviewed recent Labs, Check BP if >130/80 call office   4. Depression/ Anxiety- Controlled currently, continue RX AD w/c if SX increase or ER, recommend counseling if symptoms continue. Water activity recommended   5. Insomnia- Sleep hygiene discussed, increase daytime activity level call if no improvement   6. Allergic rhinitis- Add Flonase, increase H2o, allergy  hygiene explained.   7.  Irreg Nevi- monitor for any change, call if occurs for removal

## 2014-11-01 LAB — URINALYSIS, ROUTINE W REFLEX MICROSCOPIC
Bilirubin Urine: NEGATIVE
Glucose, UA: NEGATIVE mg/dL
Hgb urine dipstick: NEGATIVE
Ketones, ur: NEGATIVE mg/dL
Leukocytes, UA: NEGATIVE
Nitrite: NEGATIVE
Protein, ur: NEGATIVE mg/dL
Specific Gravity, Urine: 1.007 (ref 1.005–1.030)
Urobilinogen, UA: 0.2 mg/dL (ref 0.0–1.0)
pH: 6.5 (ref 5.0–8.0)

## 2014-11-01 LAB — MICROALBUMIN / CREATININE URINE RATIO
Creatinine, Urine: 35.2 mg/dL
Microalb, Ur: <0.2 mg/dL (ref <2.0)

## 2014-11-07 ENCOUNTER — Encounter: Payer: Self-pay | Admitting: *Deleted

## 2014-11-12 ENCOUNTER — Encounter: Payer: Self-pay | Admitting: *Deleted

## 2014-11-28 ENCOUNTER — Other Ambulatory Visit: Payer: Self-pay | Admitting: Internal Medicine

## 2014-11-28 DIAGNOSIS — Z1231 Encounter for screening mammogram for malignant neoplasm of breast: Secondary | ICD-10-CM

## 2014-12-12 ENCOUNTER — Encounter: Payer: Self-pay | Admitting: Emergency Medicine

## 2014-12-19 ENCOUNTER — Encounter: Payer: Self-pay | Admitting: Physician Assistant

## 2014-12-24 ENCOUNTER — Ambulatory Visit (HOSPITAL_COMMUNITY)
Admission: RE | Admit: 2014-12-24 | Discharge: 2014-12-24 | Disposition: A | Discharge status: Home / Self Care | Payer: BLUE CROSS/BLUE SHIELD | Source: Ambulatory Visit | Attending: Internal Medicine | Admitting: Internal Medicine

## 2014-12-24 DIAGNOSIS — Z1231 Encounter for screening mammogram for malignant neoplasm of breast: Secondary | ICD-10-CM

## 2014-12-31 ENCOUNTER — Other Ambulatory Visit: Payer: Self-pay | Admitting: *Deleted

## 2014-12-31 DIAGNOSIS — Z1212 Encounter for screening for malignant neoplasm of rectum: Secondary | ICD-10-CM

## 2014-12-31 DIAGNOSIS — Z0000 Encounter for general adult medical examination without abnormal findings: Secondary | ICD-10-CM

## 2014-12-31 LAB — POC HEMOCCULT BLD/STL (HOME/3-CARD/SCREEN)
Card #2 Fecal Occult Blod, POC: NEGATIVE
FECAL OCCULT BLD: NEGATIVE
Fecal Occult Blood, POC: NEGATIVE

## 2015-02-04 ENCOUNTER — Encounter: Payer: Self-pay | Admitting: Internal Medicine

## 2015-02-04 ENCOUNTER — Ambulatory Visit (INDEPENDENT_AMBULATORY_CARE_PROVIDER_SITE_OTHER): Payer: BLUE CROSS/BLUE SHIELD | Admitting: Internal Medicine

## 2015-02-04 VITALS — BP 128/80 | HR 72 | Temp 98.2°F | Resp 18 | Ht 65.0 in

## 2015-02-04 DIAGNOSIS — E559 Vitamin D deficiency, unspecified: Secondary | ICD-10-CM

## 2015-02-04 DIAGNOSIS — Z79899 Other long term (current) drug therapy: Secondary | ICD-10-CM

## 2015-02-04 DIAGNOSIS — R7309 Other abnormal glucose: Secondary | ICD-10-CM

## 2015-02-04 DIAGNOSIS — R7303 Prediabetes: Secondary | ICD-10-CM

## 2015-02-04 DIAGNOSIS — E663 Overweight: Secondary | ICD-10-CM

## 2015-02-04 DIAGNOSIS — I10 Essential (primary) hypertension: Secondary | ICD-10-CM

## 2015-02-04 DIAGNOSIS — E785 Hyperlipidemia, unspecified: Secondary | ICD-10-CM

## 2015-02-04 LAB — CBC WITH DIFFERENTIAL/PLATELET
BASOS ABS: 0.1 10*3/uL (ref 0.0–0.1)
Basophils Relative: 1 % (ref 0–1)
EOS PCT: 2 % (ref 0–5)
Eosinophils Absolute: 0.2 10*3/uL (ref 0.0–0.7)
HCT: 42.2 % (ref 36.0–46.0)
HEMOGLOBIN: 13.6 g/dL (ref 12.0–15.0)
LYMPHS ABS: 3.4 10*3/uL (ref 0.7–4.0)
Lymphocytes Relative: 40 % (ref 12–46)
MCH: 28.5 pg (ref 26.0–34.0)
MCHC: 32.2 g/dL (ref 30.0–36.0)
MCV: 88.5 fL (ref 78.0–100.0)
MONO ABS: 0.4 10*3/uL (ref 0.1–1.0)
MONOS PCT: 5 % (ref 3–12)
MPV: 11 fL (ref 8.6–12.4)
NEUTROS ABS: 4.5 10*3/uL (ref 1.7–7.7)
NEUTROS PCT: 52 % (ref 43–77)
Platelets: 251 10*3/uL (ref 150–400)
RBC: 4.77 MIL/uL (ref 3.87–5.11)
RDW: 13.4 % (ref 11.5–15.5)
WBC: 8.6 10*3/uL (ref 4.0–10.5)

## 2015-02-04 LAB — BASIC METABOLIC PANEL WITH GFR
BUN: 32 mg/dL — AB (ref 6–23)
CO2: 27 meq/L (ref 19–32)
Calcium: 9.6 mg/dL (ref 8.4–10.5)
Chloride: 102 mEq/L (ref 96–112)
Creat: 0.93 mg/dL (ref 0.50–1.10)
GFR, EST NON AFRICAN AMERICAN: 66 mL/min
GFR, Est African American: 76 mL/min
GLUCOSE: 95 mg/dL (ref 70–99)
Potassium: 4.2 mEq/L (ref 3.5–5.3)
Sodium: 141 mEq/L (ref 135–145)

## 2015-02-04 LAB — LIPID PANEL
CHOL/HDL RATIO: 2.5 ratio
CHOLESTEROL: 183 mg/dL (ref 0–200)
HDL: 74 mg/dL (ref >=46)
LDL Cholesterol: 92 mg/dL (ref 0–99)
Triglycerides: 87 mg/dL (ref <150)
VLDL: 17 mg/dL (ref 0–40)

## 2015-02-04 LAB — HEPATIC FUNCTION PANEL
ALT: 15 U/L (ref 0–35)
AST: 14 U/L (ref 0–37)
Albumin: 4 g/dL (ref 3.5–5.2)
Alkaline Phosphatase: 50 U/L (ref 39–117)
BILIRUBIN DIRECT: 0.1 mg/dL (ref 0.0–0.3)
Indirect Bilirubin: 0.3 mg/dL (ref 0.2–1.2)
TOTAL PROTEIN: 6.5 g/dL (ref 6.0–8.3)
Total Bilirubin: 0.4 mg/dL (ref 0.2–1.2)

## 2015-02-04 LAB — MAGNESIUM: MAGNESIUM: 2.1 mg/dL (ref 1.5–2.5)

## 2015-02-04 MED ORDER — MONTELUKAST SODIUM 10 MG PO TABS: 10.0000 mg | ORAL_TABLET | Freq: Every day | ORAL | Status: DC

## 2015-02-04 NOTE — Progress Notes (Signed)
Patient ID: Jade PitchJacqueline J Latorre, female   DOB: 09/29/1950, 64 y.o.   MRN: [id number]  Assessment and Plan:  Hypertension:  -Continue medication,  -monitor blood pressure at home.  -Continue DASH diet.   -Reminder to go to the ER if any CP, SOB, nausea, dizziness, severe HA, changes vision/speech, left arm numbness and tingling, and jaw pain.  Cholesterol: -Continue diet and exercise.  -Check cholesterol.   Pre-diabetes: -Continue diet and exercise.  -Check A1C  Vitamin D Def: -check level -continue medications.   Seasonal allergies -singulair -cont zyrtec and flonase  Continue diet and meds as discussed. Further disposition pending results of labs.  HPI 64 y.o. female  presents for 3 month follow up with hypertension, hyperlipidemia, prediabetes and vitamin D.   Her blood pressure has been controlled at home, today their BP is BP: 128/80 mmHg.   She does not workout. She denies chest pain, shortness of breath, dizziness.   She is on cholesterol medication and denies myalgias. Her cholesterol is at goal. The cholesterol last visit was:   Lab Results  Component Value Date   CHOL 171 09/26/2014   HDL 71 09/26/2014   LDLCALC 85 09/26/2014   TRIG 74 09/26/2014   CHOLHDL 2.4 09/26/2014     She has been working on diet and exercise for prediabetes, and denies foot ulcerations, hyperglycemia, hypoglycemia , increased appetite, nausea, paresthesia of the feet, polydipsia, polyuria, visual disturbances, vomiting and weight loss. Last A1C in the office was:  Lab Results  Component Value Date   HGBA1C 5.6 09/26/2014    Patient is on Vitamin D supplement.  Lab Results  Component Value Date   VD25OH 68 09/26/2014     [facility] work [facility]    Current Medications:  Current Outpatient Prescriptions on File Prior to Visit  Medication Sig Dispense Refill  . acitretin (SORIATANE) 25 MG capsule Take 25 mg by mouth every Monday, Wednesday, and Friday.     . cetirizine (ZYRTEC) 10 MG tablet Take 10 mg by mouth daily.    . Cholecalciferol (VITAMIN D3) 5000 UNITS CAPS Take 5,000 Units by mouth every Monday, Wednesday, and Friday.    . diazepam (VALIUM) 10 MG tablet Take 1 tablet (10 mg total) by mouth every 6 (six) hours as needed for anxiety. 90 tablet 0  . fluticasone (FLONASE) 50 MCG/ACT nasal spray Place 1 spray into both nostrils daily. 16 g 2  . naproxen (NAPROSYN) 500 MG tablet Take 1 tablet by mouth as needed.    Marland Kitchen. oxyCODONE-acetaminophen (PERCOCET) 7.5-325 MG per tablet Take 1 tablet by mouth every 4 (four) hours as needed for pain. 60 tablet 0  . pantoprazole (PROTONIX) 40 MG tablet TAKE 1 TABLET DAILY 90 tablet 3  . pravastatin (PRAVACHOL) 40 MG tablet TAKE 1 TABLET AT BEDTIME 90 tablet 1  . triamterene-hydrochlorothiazide (MAXZIDE-25) 37.5-25 MG per tablet TAKE ONE TABLET BY MOUTH AT BEDTIME 90 tablet 3   No current facility-administered medications on file prior to visit.    Medical History:  Past Medical History  Diagnosis Date  . Hypertension   . Hyperlipidemia   . GERD (gastroesophageal reflux disease)   . Unspecified vitamin D deficiency   . Obese   . Family history of anesthesia complication     "mother quit breathing" 30 yrs ago pt does not know any more details. Pt has never had any problems with anesthesia herself  . DJD (degenerative joint disease)   . Psoriasis  HANDS  . Urgency of urination   . Difficulty sleeping     takes Ambien  . Depression   . Anxiety   . Diverticulosis 5/1/5  . Psoriasis   . Peripheral edema     Allergies:  Allergies  Allergen Reactions  . Levaquin [Levofloxacin In D5w]     yeast  . Oruvail [Ketoprofen]     GI upset     Review of Systems:  Review of Systems  Constitutional: Negative for fever, chills and malaise/fatigue.  HENT: Negative for congestion, ear pain and sore throat.   Eyes: Negative.   Respiratory: Negative for cough, shortness of breath and wheezing.    Cardiovascular: Negative for chest pain, palpitations and leg swelling.  Gastrointestinal: Negative for heartburn, diarrhea, constipation, blood in stool and melena.  Genitourinary: Negative.   Skin: Negative.   Neurological: Negative for dizziness, sensory change, loss of consciousness and headaches.  Psychiatric/Behavioral: Negative for depression. The patient is not nervous/anxious and does not have insomnia.     Family history- Review and unchanged  Social history- Review and unchanged  Physical Exam: BP 128/80 mmHg  Pulse 72  Temp(Src) 98.2 F (36.8 C) (Temporal)  Resp 18  Ht  (1.651 m) Wt Readings from Last 3 Encounters:  10/31/14 202 lb (91.627 kg)  09/26/14 198 lb (89.812 kg)  06/25/14 196 lb (88.905 kg)    General Appearance: Well nourished well developed, in no apparent distress. Eyes: PERRLA, EOMs, conjunctiva no swelling or erythema ENT/Mouth: Ear canals normal without obstruction, swelling, erythma, discharge.  TMs normal bilaterally.  Oropharynx moist, clear, without exudate, or postoropharyngeal swelling. Neck: Supple, thyroid normal,no cervical adenopathy  Respiratory: Respiratory effort normal, Breath sounds clear A&P without rhonchi, wheeze, or rale.  No retractions, no accessory usage. Cardio: RRR with no MRGs. Brisk peripheral pulses without edema.  Abdomen: Soft, + BS,  Non tender, no guarding, rebound, hernias, masses. Musculoskeletal: Full ROM, 5/5 strength, Normal gait.  CAM walker on left ankle Skin: Warm, dry without rashes, lesions, ecchymosis.  Neuro: Awake and oriented X 3, Cranial nerves intact. Normal muscle tone, no cerebellar symptoms. Psych: Normal affect, Insight and Judgment appropriate.    Terri Piedra, PA-C 4:11 PM [facility]

## 2015-02-05 LAB — TSH: TSH: 1.865 u[IU]/mL (ref 0.350–4.500)

## 2015-02-05 LAB — INSULIN, RANDOM: Insulin: 9.5 u[IU]/mL (ref 2.0–19.6)

## 2015-02-05 LAB — HEMOGLOBIN A1C
Hgb A1c MFr Bld: 5.6 % (ref <5.7)
Mean Plasma Glucose: 114 mg/dL (ref <117)

## 2015-02-05 LAB — VITAMIN D 25 HYDROXY (VIT D DEFICIENCY, FRACTURES): VIT D 25 HYDROXY: 61 ng/mL (ref 30–100)

## 2015-05-08 ENCOUNTER — Ambulatory Visit (INDEPENDENT_AMBULATORY_CARE_PROVIDER_SITE_OTHER): Payer: BLUE CROSS/BLUE SHIELD | Admitting: Internal Medicine

## 2015-05-08 ENCOUNTER — Encounter: Payer: Self-pay | Admitting: Internal Medicine

## 2015-05-08 VITALS — BP 132/70 | HR 84 | Temp 97.9°F | Resp 16 | Ht 65.0 in | Wt 210.0 lb

## 2015-05-08 DIAGNOSIS — R7303 Prediabetes: Secondary | ICD-10-CM

## 2015-05-08 DIAGNOSIS — Z23 Encounter for immunization: Secondary | ICD-10-CM

## 2015-05-08 DIAGNOSIS — E559 Vitamin D deficiency, unspecified: Secondary | ICD-10-CM

## 2015-05-08 DIAGNOSIS — I10 Essential (primary) hypertension: Secondary | ICD-10-CM

## 2015-05-08 DIAGNOSIS — E785 Hyperlipidemia, unspecified: Secondary | ICD-10-CM | POA: Diagnosis not present

## 2015-05-08 MED ORDER — DIAZEPAM 10 MG PO TABS: 10.0000 mg | ORAL_TABLET | Freq: Four times a day (QID) | ORAL | Status: DC | PRN

## 2015-05-08 MED ORDER — ZOLPIDEM TARTRATE 5 MG PO TABS: 5.0000 mg | ORAL_TABLET | Freq: Every evening | ORAL | Status: DC | PRN

## 2015-05-08 NOTE — Progress Notes (Signed)
Patient ID: Jade Singh, female   DOB: 1951-02-07, 64 y.o.   MRN: [id number]  Assessment and Plan:  Hypertension:  -Continue medication,  -monitor blood pressure at home.  -Continue DASH diet.   -Reminder to go to the ER if any CP, SOB, nausea, dizziness, severe HA, changes vision/speech, left arm numbness and tingling, and jaw pain.  Cholesterol: -Continue diet and exercise.    Pre-diabetes: -Continue diet and exercise.    Vitamin D Def: -continue medications.   Continue diet and meds as discussed. Further disposition pending results of labs.  HPI 64 y.o. female  presents for 3 month follow up with hypertension, hyperlipidemia, prediabetes and vitamin D.   Her blood pressure has been controlled at home, today their BP is BP: 132/70 mmHg.   She does workout. She denies chest pain, shortness of breath, dizziness.   She is on cholesterol medication and denies myalgias. Her cholesterol is at goal. The cholesterol last visit was:   Lab Results  Component Value Date   CHOL 183 02/04/2015   HDL 74 02/04/2015   LDLCALC 92 02/04/2015   TRIG 87 02/04/2015   CHOLHDL 2.5 02/04/2015     She has been working on diet and exercise for prediabetes, and denies foot ulcerations, hyperglycemia, hypoglycemia , increased appetite, nausea, paresthesia of the feet, polydipsia, polyuria, visual disturbances, vomiting and weight loss. Last A1C in the office was:  Lab Results  Component Value Date   HGBA1C 5.6 02/04/2015    Patient is on Vitamin D supplement.  Lab Results  Component Value Date   VD25OH 61 02/04/2015     Patient reports that she is still following with [facility] for her skin issues.   Current Medications:  Current Outpatient Prescriptions on File Prior to Visit  Medication Sig Dispense Refill  . acitretin (SORIATANE) 25 MG capsule Take 25 mg by mouth every Monday, Wednesday, and Friday.    . cetirizine (ZYRTEC) 10 MG tablet Take 10 mg by mouth daily.    .  Cholecalciferol (VITAMIN D3) 5000 UNITS CAPS Take 5,000 Units by mouth every Monday, Wednesday, and Friday.    . diazepam (VALIUM) 10 MG tablet Take 1 tablet (10 mg total) by mouth every 6 (six) hours as needed for anxiety. 90 tablet 0  . fluticasone (FLONASE) 50 MCG/ACT nasal spray Place 1 spray into both nostrils daily. 16 g 2  . naproxen (NAPROSYN) 500 MG tablet Take 1 tablet by mouth as needed.    Marland Kitchen oxyCODONE-acetaminophen (PERCOCET) 7.5-325 MG per tablet Take 1 tablet by mouth every 4 (four) hours as needed for pain. 60 tablet 0  . pantoprazole (PROTONIX) 40 MG tablet TAKE 1 TABLET DAILY 90 tablet 3  . pravastatin (PRAVACHOL) 40 MG tablet TAKE 1 TABLET AT BEDTIME 90 tablet 1  . triamterene-hydrochlorothiazide (MAXZIDE-25) 37.5-25 MG per tablet TAKE ONE TABLET BY MOUTH AT BEDTIME 90 tablet 3  . zolpidem (AMBIEN) 5 MG tablet Take 5 mg by mouth at bedtime as needed for sleep.     No current facility-administered medications on file prior to visit.    Medical History:  Past Medical History  Diagnosis Date  . Hypertension   . Hyperlipidemia   . GERD (gastroesophageal reflux disease)   . Unspecified vitamin D deficiency   . Obese   . Family history of anesthesia complication     "mother quit breathing" 30 yrs ago pt does not know any more details. Pt has never had any problems with anesthesia herself  .  DJD (degenerative joint disease)   . Psoriasis     HANDS  . Urgency of urination   . Difficulty sleeping     takes Ambien  . Depression   . Anxiety   . Diverticulosis 5/1/5  . Psoriasis   . Peripheral edema     Allergies:  Allergies  Allergen Reactions  . Levaquin [Levofloxacin In D5w]     yeast  . Oruvail [Ketoprofen]     GI upset     Review of Systems:  Review of Systems  Constitutional: Negative for fever, chills and malaise/fatigue.  HENT: Negative for congestion, ear pain and sore throat.   Respiratory: Negative for cough, shortness of breath and wheezing.    Cardiovascular: Negative for chest pain, palpitations, leg swelling and PND.  Gastrointestinal: Negative for heartburn, diarrhea, constipation, blood in stool and melena.  Genitourinary: Negative.   Neurological: Negative for dizziness, sensory change, loss of consciousness and headaches.  Psychiatric/Behavioral: Negative for depression. The patient is not nervous/anxious and does not have insomnia.     Family history- Review and unchanged  Social history- Review and unchanged  Physical Exam: BP 132/70 mmHg  Pulse 84  Temp(Src) 97.9 F (36.6 C) (Temporal)  Resp 16  Ht 5\' 5"  (1.651 m)  Wt 210 lb (95.255 kg)  BMI 34.95 kg/m2  SpO2 96% Wt Readings from Last 3 Encounters:  05/08/15 210 lb (95.255 kg)  10/31/14 202 lb (91.627 kg)  09/26/14 198 lb (89.812 kg)    General Appearance: Well nourished well developed, in no apparent distress. Eyes: PERRLA, EOMs, conjunctiva no swelling or erythema ENT/Mouth: Ear canals normal without obstruction, swelling, erythma, discharge.  TMs normal bilaterally.  Oropharynx moist, clear, without exudate, or postoropharyngeal swelling. Neck: Supple, thyroid normal,no cervical adenopathy  Respiratory: Respiratory effort normal, Breath sounds clear A&P without rhonchi, wheeze, or rale.  No retractions, no accessory usage. Cardio: RRR with no MRGs. Brisk peripheral pulses without edema.  Abdomen: Soft, + BS,  Non tender, no guarding, rebound, hernias, masses. Musculoskeletal: Full ROM, 5/5 strength, Normal gait Skin: Warm, dry without rashes, lesions, ecchymosis.  Neuro: Awake and oriented X 3, Cranial nerves intact. Normal muscle tone, no cerebellar symptoms. Psych: Normal affect, Insight and Judgment appropriate.    Terri Piedraourtney Forcucci, PA-C 4:47 PM [facility]

## 2015-05-26 ENCOUNTER — Other Ambulatory Visit: Payer: Self-pay | Admitting: Internal Medicine

## 2015-05-30 ENCOUNTER — Encounter: Payer: Self-pay | Admitting: Internal Medicine

## 2015-05-30 ENCOUNTER — Other Ambulatory Visit: Payer: Self-pay | Admitting: Internal Medicine

## 2015-05-30 DIAGNOSIS — G4700 Insomnia, unspecified: Secondary | ICD-10-CM

## 2015-05-30 MED ORDER — ZOLPIDEM TARTRATE 5 MG PO TABS: ORAL_TABLET | ORAL | Status: DC

## 2015-06-20 ENCOUNTER — Ambulatory Visit: Payer: BLUE CROSS/BLUE SHIELD | Admitting: Cardiovascular Disease

## 2015-06-25 ENCOUNTER — Ambulatory Visit (INDEPENDENT_AMBULATORY_CARE_PROVIDER_SITE_OTHER): Payer: BLUE CROSS/BLUE SHIELD | Admitting: Cardiovascular Disease

## 2015-06-25 ENCOUNTER — Encounter: Payer: Self-pay | Admitting: Cardiovascular Disease

## 2015-06-25 VITALS — BP 130/72 | HR 85 | Resp 16 | Ht 65.5 in | Wt 215.6 lb

## 2015-06-25 DIAGNOSIS — I10 Essential (primary) hypertension: Secondary | ICD-10-CM

## 2015-06-25 DIAGNOSIS — E785 Hyperlipidemia, unspecified: Secondary | ICD-10-CM

## 2015-06-25 MED ORDER — TRIAMTERENE-HCTZ 37.5-25 MG PO TABS: 1.0000 | ORAL_TABLET | Freq: Every day | ORAL | Status: DC

## 2015-06-25 MED ORDER — PANTOPRAZOLE SODIUM 40 MG PO TBEC: 40.0000 mg | DELAYED_RELEASE_TABLET | Freq: Every day | ORAL | Status: DC

## 2015-06-25 NOTE — Progress Notes (Signed)
Patient ID: Jade Singh, female   DOB: 06/07/1951, 64 y.o.   MRN: [id number]     Cardiology Office Note   Date:  06/27/2015   ID:  Jade Singh, DOB 03/31/1951, MRN [id number]  PCP:  Jade CorwinMCKEOWN,WILLIAM DAVID, MD  Cardiologist:   Jade Singh,Jade Kasler, MD   Chief Complaint  Patient presents with  . Follow-up    occassonal chest pain-stress related, no shortness of breath, no edema, no pain in legs, occassional cramping in legs, no lightheaded or dizziness       History of Present Illness: Jade Singh is a 64 y.o. female who presents for  Follow-up of systemic hypertension and hyperlipidemia. She has a strong family history of CAD, but no personal history of coronary disease and had a normal nuclear stress test and normal echocardiogram in 2013. She has normal left ventricular systolic and diastolic function. She has gained back about 10 pounds after having lost 40 pounds in 2 years using [employer]. She generally feels well. She has occasional chest discomfort that is related to anxiety and never occurs during physical activity. She denies dyspnea, edema, claudication, palpitations, syncope. She is now taking her statin only 3 days a week for musculoskeletal complaints.    Past Medical History  Diagnosis Date  . Hypertension   . Hyperlipidemia   . GERD (gastroesophageal reflux disease)   . Unspecified vitamin D deficiency   . Obese   . Family history of anesthesia complication     "mother quit breathing" 30 yrs ago pt does not know any more details. Pt has never had any problems with anesthesia herself  . DJD (degenerative joint disease)   . Psoriasis     HANDS  . Urgency of urination   . Difficulty sleeping     takes Ambien  . Depression   . Anxiety   . Diverticulosis 5/1/5  . Psoriasis   . Peripheral edema     Past Surgical History  Procedure Laterality Date  . Knee arthroscopy      RT 2011, LT 2009  . Appendectomy    . Tubal ligation    . Abdominal  hysterectomy      partial  . Tonsillectomy and adenoidectomy    . Cardiac catheterization  2005     no problems per pt   . 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 acitretin (SORIATANE) 25 MG capsule Take 25 mg by mouth every Monday, Wednesday, and Friday.    Marland Kitchen. aspirin 81 MG tablet Take 81 mg by mouth daily.    . cetirizine (ZYRTEC) 10 MG tablet Take 10 mg by mouth daily.    . Cholecalciferol (VITAMIN D3) 5000 UNITS CAPS Take 5,000 Units by mouth every Monday, Wednesday, and Friday.    . cyclobenzaprine (FLEXERIL) 10 MG tablet Take 1 tablet by mouth 2 (two) times daily. Take 1 tab bid prn    . diazepam (VALIUM) 10 MG tablet Take 1 tablet (10 mg total) by mouth every 6 (six) hours as needed for anxiety. 90 tablet 0  . fluticasone (FLONASE) 50 MCG/ACT nasal spray Place 1 spray into both nostrils daily. 16 g 2  . naproxen (NAPROSYN) 500 MG tablet Take 1 tablet by mouth as needed.    Marland Kitchen. oxyCODONE-acetaminophen (PERCOCET/ROXICET) 5-325 MG tablet Take 1  tablet by mouth every 4 (four) hours as needed for severe pain.    . pantoprazole (PROTONIX) 40 MG tablet Take 1 tablet (40 mg total) by mouth daily. 90 tablet 3  . pravastatin (PRAVACHOL) 40 MG tablet TAKE 1 TABLET AT BEDTIME 90 tablet 0  . triamterene-hydrochlorothiazide (MAXZIDE-25) 37.5-25 MG tablet Take 1 tablet by mouth at bedtime. 90 tablet 3  . zolpidem (AMBIEN) 5 MG tablet Take 1/2 to 1 tablet at hour of sleep 30 tablet 5   No current facility-administered medications for this visit.    Allergies:   Levaquin and Oruvail    Social History:  The patient  reports that she quit smoking about 14 years ago. She has never used smokeless tobacco. She reports that she drinks alcohol. She reports that she does not use illicit drugs.    Family History:  The patient's family history includes Cancer (age of onset: 65) in her brother; Diabetes in her paternal uncle; Heart attack in her maternal grandfather and mother; Heart disease in her paternal grandfather; Hyperlipidemia in her mother; Stroke in her mother. There is no history of Colon cancer.    ROS:  Please see the history of present illness.    Otherwise, review of systems positive for none.   All other systems are reviewed and negative.    PHYSICAL EXAM: VS:  BP 130/72 mmHg  Pulse 85  Resp 16  Ht 5' 5.5" (1.664 m)  Wt 215 lb 9 oz (97.779 kg)  BMI 35.31 kg/m2 , BMI Body mass index is 35.31 kg/(m^2).  General: Alert, oriented x3, no distress Head: no evidence of trauma, PERRL, EOMI, no exophtalmos or lid lag, no myxedema, no xanthelasma; normal ears, nose and oropharynx Neck: normal jugular venous pulsations and no hepatojugular reflux; brisk carotid pulses without delay and no carotid bruits Chest: clear to auscultation, no signs of consolidation by percussion or palpation, normal fremitus, symmetrical and full respiratory excursions Cardiovascular: normal position and quality of the apical impulse, regular rhythm, normal first and second heart sounds, no murmurs, rubs or gallops Abdomen: no tenderness or distention, no masses by palpation, no abnormal pulsatility or arterial bruits, normal bowel sounds, no hepatosplenomegaly Extremities: no clubbing, cyanosis or edema; 2+ radial, ulnar and brachial pulses bilaterally; 2+ right femoral, posterior tibial and dorsalis pedis pulses; 2+ left femoral, posterior tibial and dorsalis pedis pulses; no subclavian or femoral bruits Neurological: grossly nonfocal Psych: euthymic mood, full affect   EKG:  EKG is ordered today. The ekg ordered today demonstrates  Sinus rhythm with a single PAC, otherwise normal   Recent Labs: 02/04/2015: ALT 15; BUN 32*; Creat 0.93; Hemoglobin 13.6; Magnesium 2.1; Platelets 251; Potassium  4.2; Sodium 141; TSH 1.865    Lipid Panel    Component Value Date/Time   CHOL 183 02/04/2015 1623   TRIG 87 02/04/2015 1623   HDL 74 02/04/2015 1623   CHOLHDL 2.5 02/04/2015 1623   VLDL 17 02/04/2015 1623   LDLCALC 92 02/04/2015 1623      Wt Readings from Last 3 Encounters:  06/25/15 215 lb 9 oz (97.779 kg)  05/08/15 210 lb (95.255 kg)  10/31/14 202 lb (91.627 kg)       ASSESSMENT AND PLAN:  1.  Essential hypertension, well controlled  2.  Hyperlipidemia on statin therapy,  LDL cholesterol at target when last checked  3.  Gastroesophageal reflux disease. She asked me for refill on her proton pump inhibitor today,  But plans to get this from her primary care  provider in the future.    Current medicines are reviewed at length with the patient today.  The patient does not have concerns regarding medicines.  The following changes have been made:  no change  Labs/ tests ordered today include:  Orders Placed This Encounter  Procedures  . EKG 12-Lead     Patient Instructions  Dr. Royann Shivers recommends that you schedule a follow-up appointment in: ONE YEAR       SignedThurmon Fair, MD  06/27/2015 9:37 AM    Jade Fair, MD, [facility] [phone] office [phone] pager

## 2015-06-25 NOTE — Patient Instructions (Signed)
Dr. Croitoru recommends that you schedule a follow-up appointment in: ONE YEAR   

## 2015-06-27 ENCOUNTER — Encounter: Payer: Self-pay | Admitting: Cardiovascular Disease

## 2015-07-02 ENCOUNTER — Ambulatory Visit (INDEPENDENT_AMBULATORY_CARE_PROVIDER_SITE_OTHER): Payer: BLUE CROSS/BLUE SHIELD | Admitting: Internal Medicine

## 2015-07-02 ENCOUNTER — Encounter: Payer: Self-pay | Admitting: Internal Medicine

## 2015-07-02 VITALS — BP 126/84 | HR 88 | Temp 98.2°F | Resp 18 | Ht 65.0 in | Wt 213.0 lb

## 2015-07-02 DIAGNOSIS — L03019 Cellulitis of unspecified finger: Secondary | ICD-10-CM | POA: Diagnosis not present

## 2015-07-02 DIAGNOSIS — IMO0002 Reserved for concepts with insufficient information to code with codable children: Secondary | ICD-10-CM

## 2015-07-02 MED ORDER — SULFAMETHOXAZOLE-TRIMETHOPRIM 800-160 MG PO TABS: 1.0000 | ORAL_TABLET | Freq: Two times a day (BID) | ORAL | Status: DC

## 2015-07-02 NOTE — Progress Notes (Signed)
   Subjective:    Patient ID: Jade Singh, female    DOB: 10/30/1950, 64 y.o.   MRN: [id number]  HPI  Patient presents to  The office for evaluation of finger tip pain and redness in bilateral ring fingers x 1 day.  She reports no injury.  She hasn't tried anything at this time.  She does note very severe psoriasis in her hands at this time of the year.    Review of Systems  Constitutional: Negative for fever, chills and fatigue.  Gastrointestinal: Negative for nausea and vomiting.  Skin: Positive for color change and wound.       Objective:   Physical Exam  Constitutional: She is oriented to person, place, and time. She appears well-developed and well-nourished. No distress.  HENT:  Head: Normocephalic.  Mouth/Throat: Oropharynx is clear and moist. No oropharyngeal exudate.  Eyes: Conjunctivae are normal. No scleral icterus.  Neck: Normal range of motion. Neck supple. No JVD present. No thyromegaly present.  Cardiovascular: Normal rate, regular rhythm, normal heart sounds and intact distal pulses.  Exam reveals no gallop and no friction rub.   No murmur heard. Pulmonary/Chest: Effort normal and breath sounds normal. No respiratory distress. She has no wheezes. She has no rales. She exhibits no tenderness.  Musculoskeletal: Normal range of motion.  Lymphadenopathy:    She has no cervical adenopathy.  Neurological: She is alert and oriented to person, place, and time.  Skin: Skin is warm and dry. She is not diaphoretic.  Bilateral 0.5 mm yellow abscessces on ring finger tips with no finger pad tenderness.  Mild redness.    Psychiatric: She has a normal mood and affect. Her behavior is normal. Judgment and thought content normal.  Nursing note and vitals reviewed.   Filed Vitals:   07/02/15 0959  BP: 126/84  Pulse: 88  Temp: 98.2 F (36.8 C)  Resp: 18          Assessment & Plan:    1. Paronychia, unspecified laterality -needle I&D done here  bilaterally -bactrim DS BID -warm water soaks -follow up prn

## 2015-07-02 NOTE — Patient Instructions (Signed)
Fingertip Infection When an infection is around the nail, it is called a paronychia. When it appears over the tip of the finger, it is called a felon. These infections are due to minor injuries or cracks in the skin. If they are not treated properly, they can lead to bone infection and permanent damage to the fingernail. Incision and drainage is necessary if a pus pocket (an abscess) has formed. Antibiotics and pain medicine may also be needed. Keep your hand elevated for the next 2-3 days to reduce swelling and pain. If a pack was placed in the abscess, it should be removed in 1-2 days by your caregiver. Soak the finger in warm water for 20 minutes 4 times daily to help promote drainage. Keep the hands as dry as possible. Wear protective gloves with cotton liners. See your caregiver for follow-up care as recommended.  HOME CARE INSTRUCTIONS   Keep wound clean, dry and dressed as suggested by your caregiver.  Soak in warm salt water for fifteen minutes, four times per day for bacterial infections.  Your caregiver will prescribe an antibiotic if a bacterial infection is suspected. Take antibiotics as directed and finish the prescription, even if the problem appears to be improving before the medicine is gone.  Only take over-the-counter or prescription medicines for pain, discomfort, or fever as directed by your caregiver. SEEK IMMEDIATE MEDICAL CARE IF:  There is redness, swelling, or increasing pain in the wound.  Pus or any other unusual drainage is coming from the wound.  An unexplained oral temperature above 102 F (38.9 C) develops.  You notice a foul smell coming from the wound or dressing. MAKE SURE YOU:   Understand these instructions.  Monitor your condition.  Contact your caregiver if you are getting worse or not improving.   This information is not intended to replace advice given to you by your health care provider. Make sure you discuss any questions you have with your  health care provider.   Document Released: 08/12/2004 Document Revised: 09/27/2011 Document Reviewed: 12/23/2014 Elsevier Interactive Patient Education 2016 [employer]  

## 2015-07-03 ENCOUNTER — Ambulatory Visit: Payer: Self-pay | Admitting: Internal Medicine

## 2015-08-09 ENCOUNTER — Other Ambulatory Visit: Payer: Self-pay | Admitting: Cardiovascular Disease

## 2015-08-11 NOTE — Telephone Encounter (Signed)
REFILL 

## 2015-11-06 ENCOUNTER — Ambulatory Visit (INDEPENDENT_AMBULATORY_CARE_PROVIDER_SITE_OTHER): Payer: BLUE CROSS/BLUE SHIELD | Admitting: Internal Medicine

## 2015-11-06 ENCOUNTER — Encounter: Payer: Self-pay | Admitting: Internal Medicine

## 2015-11-06 VITALS — BP 116/64 | HR 74 | Temp 98.0°F | Resp 18 | Ht 64.5 in | Wt 218.0 lb

## 2015-11-06 DIAGNOSIS — I10 Essential (primary) hypertension: Secondary | ICD-10-CM

## 2015-11-06 DIAGNOSIS — Z136 Encounter for screening for cardiovascular disorders: Secondary | ICD-10-CM

## 2015-11-06 DIAGNOSIS — E785 Hyperlipidemia, unspecified: Secondary | ICD-10-CM

## 2015-11-06 DIAGNOSIS — R7303 Prediabetes: Secondary | ICD-10-CM

## 2015-11-06 DIAGNOSIS — Z0000 Encounter for general adult medical examination without abnormal findings: Secondary | ICD-10-CM | POA: Diagnosis not present

## 2015-11-06 DIAGNOSIS — Z0001 Encounter for general adult medical examination with abnormal findings: Secondary | ICD-10-CM

## 2015-11-06 DIAGNOSIS — R002 Palpitations: Secondary | ICD-10-CM

## 2015-11-06 DIAGNOSIS — Z79899 Other long term (current) drug therapy: Secondary | ICD-10-CM

## 2015-11-06 DIAGNOSIS — Z1212 Encounter for screening for malignant neoplasm of rectum: Secondary | ICD-10-CM

## 2015-11-06 DIAGNOSIS — E559 Vitamin D deficiency, unspecified: Secondary | ICD-10-CM

## 2015-11-06 DIAGNOSIS — G4700 Insomnia, unspecified: Secondary | ICD-10-CM

## 2015-11-06 DIAGNOSIS — Z130 Encounter for screening for diseases of the blood and blood-forming organs and certain disorders involving the immune mechanism: Secondary | ICD-10-CM

## 2015-11-06 LAB — CBC WITH DIFFERENTIAL/PLATELET
BASOS PCT: 0 %
Basophils Absolute: 0 cells/uL (ref 0–200)
Eosinophils Absolute: 270 cells/uL (ref 15–500)
Eosinophils Relative: 3 %
HCT: 41.6 % (ref 35.0–45.0)
Hemoglobin: 13.5 g/dL (ref 11.7–15.5)
LYMPHS PCT: 33 %
Lymphs Abs: 2970 cells/uL (ref 850–3900)
MCH: 28.9 pg (ref 27.0–33.0)
MCHC: 32.5 g/dL (ref 32.0–36.0)
MCV: 89.1 fL (ref 80.0–100.0)
MONOS PCT: 6 %
MPV: 10.5 fL (ref 7.5–12.5)
Monocytes Absolute: 540 cells/uL (ref 200–950)
Neutro Abs: 5220 cells/uL (ref 1500–7800)
Neutrophils Relative %: 58 %
PLATELETS: 253 10*3/uL (ref 140–400)
RBC: 4.67 MIL/uL (ref 3.80–5.10)
RDW: 13.9 % (ref 11.0–15.0)
WBC: 9 10*3/uL (ref 3.8–10.8)

## 2015-11-06 MED ORDER — PRAVASTATIN SODIUM 40 MG PO TABS: 40.0000 mg | ORAL_TABLET | Freq: Every day | ORAL | Status: DC

## 2015-11-06 MED ORDER — DIAZEPAM 10 MG PO TABS: 10.0000 mg | ORAL_TABLET | Freq: Four times a day (QID) | ORAL | Status: DC | PRN

## 2015-11-06 MED ORDER — PANTOPRAZOLE SODIUM 40 MG PO TBEC: 40.0000 mg | DELAYED_RELEASE_TABLET | Freq: Every day | ORAL | Status: DC

## 2015-11-06 MED ORDER — ZOLPIDEM TARTRATE 5 MG PO TABS: ORAL_TABLET | ORAL | Status: DC

## 2015-11-06 NOTE — Progress Notes (Signed)
Complete Physical  Assessment and Plan:   1. Essential hypertension  - Urinalysis, Routine w reflex microscopic (not at [facility]) - Microalbumin / creatinine urine ratio - EKG 12-Lead - US, RETROPERITNL ABD,  LTD - TSH  2. Insomnia  - zolpidem (AMBIEN) 5 MG tablet; Take 1/2 to 1 tablet at hour of sleep  Dispense: 30 tablet; Refill: 5  3. Hyperlipidemia -cont meds - Lipid panel  4. Prediabetes  - Hemoglobin A1c - Insulin, random  5. Vitamin D deficiency -cont supplement - VITAMIN D 25 Hydroxy (Vit-D Deficiency, Fractures)  6. Medication management  - CBC with Differential/Platelet - BASIC METABOLIC PANEL WITH GFR - Hepatic function panel - Magnesium  7. Encounter for general adult medical examination with abnormal findings   8. Palpitations -EKG -cont following with cards  9. Screening for deficiency anemia  - Iron and TIBC - Vitamin B12  10. Screening for rectal cancer  - POC Hemoccult Bld/Stl (3-Cd Home Screen); Future    Discussed med's effects and SE's. Screening labs and tests as requested with regular follow-up as recommended.  HPI  65 y.o. female  presents for a complete physical.  Her blood pressure has been controlled at home, today their BP is BP: 116/64 mmHg.  She does not workout. She denies chest pain, shortness of breath, dizziness. She has not been exercising much lately.  She reports that she and her husband have been working on their diet together.    She is on cholesterol medication and denies myalgias. Her cholesterol is at goal. The cholesterol last visit was:  Lab Results  Component Value Date   CHOL 183 02/04/2015   HDL 74 02/04/2015   LDLCALC 92 02/04/2015   TRIG 87 02/04/2015   CHOLHDL 2.5 02/04/2015  .  She has been working on diet and exercise for prediabetes, she is on bASA, she is on ACE/ARB and denies foot ulcerations, hyperglycemia, hypoglycemia , increased appetite, nausea, paresthesia of the feet, polydipsia, polyuria,  visual disturbances, vomiting and weight loss. Last A1C in the office was:  Lab Results  Component Value Date   HGBA1C 5.6 02/04/2015    Patient is on Vitamin D supplement.   Lab Results  Component Value Date   VD25OH 61 02/04/2015     She is following with Dr. Phillips Odorroiteru for palpitations.  She reports that she is seeing him every 6 months.   She is also following with [facility] for the chronic rash on the palms and her soles.  She reports that she is still taking medication for it but feels that it is not doing much.  She does ask that we send a copy of labs to [facility].    Current Medications:  Current Outpatient Prescriptions on File Prior to Visit  Medication Sig Dispense Refill  . acitretin (SORIATANE) 25 MG capsule Take 25 mg by mouth every Monday, Wednesday, and Friday.    Marland Kitchen. aspirin 81 MG tablet Take 81 mg by mouth daily.    . cetirizine (ZYRTEC) 10 MG tablet Take 10 mg by mouth daily.    . Cholecalciferol (VITAMIN D3) 5000 UNITS CAPS Take 5,000 Units by mouth every Monday, Wednesday, and Friday.    . cyclobenzaprine (FLEXERIL) 10 MG tablet Take 1 tablet by mouth 2 (two) times daily. Take 1 tab bid prn    . diazepam (VALIUM) 10 MG tablet Take 1 tablet (10 mg total) by mouth every 6 (six) hours as needed for anxiety. 90 tablet 0  . fluticasone (FLONASE)  50 MCG/ACT nasal spray Place 1 spray into both nostrils daily. 16 g 2  . naproxen (NAPROSYN) 500 MG tablet Take 1 tablet by mouth as needed.    . pantoprazole (PROTONIX) 40 MG tablet TAKE 1 TABLET DAILY 90 tablet 3  . pravastatin (PRAVACHOL) 40 MG tablet TAKE 1 TABLET AT BEDTIME 90 tablet 0  . triamterene-hydrochlorothiazide (MAXZIDE-25) 37.5-25 MG tablet Take 1 tablet by mouth at bedtime. 90 tablet 3  . zolpidem (AMBIEN) 5 MG tablet Take 1/2 to 1 tablet at hour of sleep 30 tablet 5   No current facility-administered medications on file prior to visit.    Health Maintenance:   Immunization History   Administered Date(s) Administered  . Influenza Split 06/25/2014, 05/08/2015  . Influenza-Unspecified 05/28/2013  . PPD Test 08/29/2013  . Pneumococcal Polysaccharide-23 06/07/2013  . Tdap 05/19/2009  . Zoster 06/25/2014    Tetanus: 2010 Pneumovax: today Flu vaccine: 2016 Zostavax: 2015 Pap: Status post hysterectomy MGM: 6/16 DEXA: 2013 Colonoscopy: 2015 Last Dental Exam: Dr. Melynda Ripple due in May Last Eye Exam: [facility], due in May  Patient Care Team: [name], MD as PCP - General (Internal Medicine) Thurmon Fair, MD as Consulting Physician (Cardiology) Jene Every, MD as Consulting Physician (Orthopedic Surgery)  Allergies:  Allergies  Allergen Reactions  . Levaquin [Levofloxacin In D5w]     yeast  . Oruvail [Ketoprofen]     GI upset    Medical History:  Past Medical History  Diagnosis Date  . Hypertension   . Hyperlipidemia   . GERD (gastroesophageal reflux disease)   . Unspecified vitamin D deficiency   . Obese   . Family history of anesthesia complication     "mother quit breathing" 30 yrs ago pt does not know any more details. Pt has never had any problems with anesthesia herself  . DJD (degenerative joint disease)   . Psoriasis     HANDS  . Urgency of urination   . Difficulty sleeping     takes Ambien  . Depression   . Anxiety   . Diverticulosis 5/1/5  . Psoriasis   . Peripheral edema     Surgical History:  Past Surgical History  Procedure Laterality Date  . Knee arthroscopy      RT 2011, LT 2009  . Appendectomy    . Tubal ligation    . Abdominal hysterectomy      partial  . Tonsillectomy and adenoidectomy    . Cardiac catheterization  2005     no problems per pt   . Total knee arthroplasty Right 02/28/2014    Procedure: RIGHT TOTAL KNEE ARTHROPLASTY;  Surgeon: Javier Docker, MD;  Location: [facility];  Service: Orthopedics;  Laterality: Right;    Family History:  Family History  Problem Relation Age of Onset  . Stroke  Mother   . Hyperlipidemia Mother   . Heart attack Mother   . Cancer Brother 44    prostate, deceased  . Diabetes Paternal Uncle   . Heart disease Paternal Grandfather   . Colon cancer Neg Hx   . Heart attack Maternal Grandfather     Social History:  Social History  Substance Use Topics  . Smoking status: Former Smoker    Quit date: 07/19/2000  . Smokeless tobacco: Never Used  . Alcohol Use: Yes     Comment: occ    Review of Systems: Review of Systems  Constitutional: Negative for fever, chills and malaise/fatigue.  HENT: Negative for congestion, ear pain and  sore throat.   Eyes: Negative.   Respiratory: Negative for cough, shortness of breath and wheezing.   Cardiovascular: Positive for palpitations. Negative for chest pain and leg swelling.  Gastrointestinal: Negative for heartburn, abdominal pain, diarrhea, constipation, blood in stool and melena.  Genitourinary: Negative.   Skin: Positive for rash.  Neurological: Negative for dizziness, sensory change, loss of consciousness and headaches.  Psychiatric/Behavioral: Negative for depression. The patient is nervous/anxious. The patient does not have insomnia.     Physical Exam: Estimated body mass index is 36.86 kg/(m^2) as calculated from the following:   Height as of this encounter: 5' 4.5" (1.638 m).   Weight as of this encounter: 218 lb (98.884 kg). BP 116/64 mmHg  Pulse 74  Temp(Src) 98 F (36.7 C) (Temporal)  Resp 18  Ht 5' 4.5" (1.638 m)  Wt 218 lb (98.884 kg)  BMI 36.86 kg/m2  General Appearance: Well nourished well developed, in no apparent distress.  Eyes: PERRLA, EOMs, conjunctiva no swelling or erythema ENT/Mouth: Ear canals normal without obstruction, swelling, erythema, or discharge.  TMs normal bilaterally with no erythema, bulging, retraction, or loss of landmark.  Oropharynx moist and clear with no exudate, erythema, or swelling.   Neck: Supple, thyroid normal. No bruits.  No cervical  adenopathy Respiratory: Respiratory effort normal, Breath sounds clear A&P without wheeze, rhonchi, rales.   Cardio: RRR without murmurs, rubs or gallops. Brisk peripheral pulses without edema.  Chest: symmetric, with normal excursions Breasts: Symmetric, without lumps, nipple discharge, retractions.  Abdomen: Soft, nontender, no guarding, rebound, hernias, masses, or organomegaly.  Lymphatics: Non tender without lymphadenopathy.  Genitourinary:  Musculoskeletal: Full ROM all peripheral extremities,5/5 strength, and normal gait.  Skin: Warm, dry without rashes, lesions, ecchymosis. Neuro: Awake and oriented X 3, Cranial nerves intact, reflexes equal bilaterally. Normal muscle tone, no cerebellar symptoms. Sensation intact.  Psych:  normal affect, Insight and Judgment appropriate.   EKG: Frequent PACs without significant change from prior.    AORTA SCAN: WNL   Over 40 minutes of exam, counseling, chart review and critical decision making was performed  Toni Amend Forcucci 3:24 PM [facility]

## 2015-11-07 LAB — BASIC METABOLIC PANEL WITH GFR
BUN: 33 mg/dL — ABNORMAL HIGH (ref 7–25)
CHLORIDE: 107 mmol/L (ref 98–110)
CO2: 22 mmol/L (ref 20–31)
CREATININE: 0.81 mg/dL (ref 0.50–0.99)
Calcium: 9 mg/dL (ref 8.6–10.4)
GFR, Est African American: 89 mL/min (ref >=60)
GFR, Est Non African American: 78 mL/min (ref >=60)
GLUCOSE: 86 mg/dL (ref 65–99)
Potassium: 4 mmol/L (ref 3.5–5.3)
SODIUM: 139 mmol/L (ref 135–146)

## 2015-11-07 LAB — URINALYSIS, ROUTINE W REFLEX MICROSCOPIC
Bilirubin Urine: NEGATIVE
Glucose, UA: NEGATIVE
Hgb urine dipstick: NEGATIVE
Ketones, ur: NEGATIVE
Leukocytes, UA: NEGATIVE
NITRITE: NEGATIVE
Protein, ur: NEGATIVE
SPECIFIC GRAVITY, URINE: 1.024 (ref 1.001–1.035)
pH: 8 (ref 5.0–8.0)

## 2015-11-07 LAB — HEPATIC FUNCTION PANEL
ALT: 18 U/L (ref 6–29)
AST: 16 U/L (ref 10–35)
Albumin: 3.9 g/dL (ref 3.6–5.1)
Alkaline Phosphatase: 52 U/L (ref 33–130)
BILIRUBIN DIRECT: 0.1 mg/dL (ref <=0.2)
BILIRUBIN INDIRECT: 0.2 mg/dL (ref 0.2–1.2)
TOTAL PROTEIN: 6.1 g/dL (ref 6.1–8.1)
Total Bilirubin: 0.3 mg/dL (ref 0.2–1.2)

## 2015-11-07 LAB — MICROALBUMIN / CREATININE URINE RATIO
CREATININE, URINE: 90 mg/dL (ref 20–320)
MICROALB UR: 0.4 mg/dL
MICROALB/CREAT RATIO: 4 ug/mg{creat} (ref <30)

## 2015-11-07 LAB — LIPID PANEL
CHOL/HDL RATIO: 3 ratio (ref <=5.0)
Cholesterol: 185 mg/dL (ref 125–200)
HDL: 61 mg/dL (ref >=46)
LDL Cholesterol: 99 mg/dL (ref <130)
Triglycerides: 123 mg/dL (ref <150)
VLDL: 25 mg/dL (ref <30)

## 2015-11-07 LAB — IRON AND TIBC
%SAT: 16 % (ref 11–50)
Iron: 48 ug/dL (ref 45–160)
TIBC: 308 ug/dL (ref 250–450)
UIBC: 260 ug/dL (ref 125–400)

## 2015-11-07 LAB — VITAMIN B12: Vitamin B-12: 723 pg/mL (ref 200–1100)

## 2015-11-07 LAB — VITAMIN D 25 HYDROXY (VIT D DEFICIENCY, FRACTURES): VIT D 25 HYDROXY: 54 ng/mL (ref 30–100)

## 2015-11-07 LAB — TSH: TSH: 1.11 m[IU]/L

## 2015-11-07 LAB — MAGNESIUM: MAGNESIUM: 2 mg/dL (ref 1.5–2.5)

## 2015-11-07 LAB — HEMOGLOBIN A1C
HEMOGLOBIN A1C: 6 % — AB (ref <5.7)
MEAN PLASMA GLUCOSE: 126 mg/dL

## 2015-11-07 LAB — INSULIN, RANDOM: Insulin: 14 u[IU]/mL (ref 2.0–19.6)

## 2015-12-02 ENCOUNTER — Other Ambulatory Visit: Payer: Self-pay

## 2015-12-02 DIAGNOSIS — Z1231 Encounter for screening mammogram for malignant neoplasm of breast: Secondary | ICD-10-CM

## 2015-12-25 ENCOUNTER — Ambulatory Visit
Admission: RE | Admit: 2015-12-25 | Discharge: 2015-12-25 | Disposition: A | Discharge status: Home / Self Care | Payer: BLUE CROSS/BLUE SHIELD | Source: Ambulatory Visit

## 2015-12-25 DIAGNOSIS — Z1231 Encounter for screening mammogram for malignant neoplasm of breast: Secondary | ICD-10-CM

## 2016-02-10 ENCOUNTER — Ambulatory Visit: Payer: Self-pay | Admitting: Internal Medicine

## 2016-04-05 ENCOUNTER — Other Ambulatory Visit: Payer: Self-pay | Admitting: Internal Medicine

## 2016-04-13 ENCOUNTER — Ambulatory Visit (INDEPENDENT_AMBULATORY_CARE_PROVIDER_SITE_OTHER): Payer: BLUE CROSS/BLUE SHIELD | Admitting: Internal Medicine

## 2016-04-13 ENCOUNTER — Encounter: Payer: Self-pay | Admitting: Internal Medicine

## 2016-04-13 VITALS — BP 132/84 | HR 80 | Temp 98.2°F | Resp 16 | Ht 64.5 in | Wt 229.0 lb

## 2016-04-13 DIAGNOSIS — Z23 Encounter for immunization: Secondary | ICD-10-CM | POA: Diagnosis not present

## 2016-04-13 DIAGNOSIS — I10 Essential (primary) hypertension: Secondary | ICD-10-CM | POA: Diagnosis not present

## 2016-04-13 DIAGNOSIS — E785 Hyperlipidemia, unspecified: Secondary | ICD-10-CM

## 2016-04-13 DIAGNOSIS — R0602 Shortness of breath: Secondary | ICD-10-CM

## 2016-04-13 DIAGNOSIS — R079 Chest pain, unspecified: Secondary | ICD-10-CM

## 2016-04-13 DIAGNOSIS — Z79899 Other long term (current) drug therapy: Secondary | ICD-10-CM

## 2016-04-13 DIAGNOSIS — R7303 Prediabetes: Secondary | ICD-10-CM

## 2016-04-13 LAB — CBC WITH DIFFERENTIAL/PLATELET
BASOS PCT: 1 %
Basophils Absolute: 81 cells/uL (ref 0–200)
EOS ABS: 243 {cells}/uL (ref 15–500)
Eosinophils Relative: 3 %
HEMATOCRIT: 44.2 % (ref 35.0–45.0)
Hemoglobin: 14.2 g/dL (ref 11.7–15.5)
LYMPHS PCT: 38 %
Lymphs Abs: 3078 cells/uL (ref 850–3900)
MCH: 28.5 pg (ref 27.0–33.0)
MCHC: 32.1 g/dL (ref 32.0–36.0)
MCV: 88.8 fL (ref 80.0–100.0)
MONO ABS: 486 {cells}/uL (ref 200–950)
MONOS PCT: 6 %
MPV: 10.5 fL (ref 7.5–12.5)
NEUTROS ABS: 4212 {cells}/uL (ref 1500–7800)
Neutrophils Relative %: 52 %
PLATELETS: 235 10*3/uL (ref 140–400)
RBC: 4.98 MIL/uL (ref 3.80–5.10)
RDW: 13.9 % (ref 11.0–15.0)
WBC: 8.1 10*3/uL (ref 3.8–10.8)

## 2016-04-13 LAB — LIPID PANEL
CHOLESTEROL: 189 mg/dL (ref 125–200)
HDL: 67 mg/dL (ref >=46)
LDL Cholesterol: 98 mg/dL (ref <130)
Total CHOL/HDL Ratio: 2.8 Ratio (ref <=5.0)
Triglycerides: 122 mg/dL (ref <150)
VLDL: 24 mg/dL (ref <30)

## 2016-04-13 LAB — BASIC METABOLIC PANEL WITH GFR
BUN: 25 mg/dL (ref 7–25)
CHLORIDE: 104 mmol/L (ref 98–110)
CO2: 25 mmol/L (ref 20–31)
CREATININE: 0.84 mg/dL (ref 0.50–0.99)
Calcium: 9.2 mg/dL (ref 8.6–10.4)
GFR, Est African American: 86 mL/min (ref >=60)
GFR, Est Non African American: 74 mL/min (ref >=60)
Glucose, Bld: 90 mg/dL (ref 65–99)
Potassium: 3.9 mmol/L (ref 3.5–5.3)
Sodium: 141 mmol/L (ref 135–146)

## 2016-04-13 LAB — HEPATIC FUNCTION PANEL
ALBUMIN: 4.1 g/dL (ref 3.6–5.1)
ALT: 18 U/L (ref 6–29)
AST: 14 U/L (ref 10–35)
Alkaline Phosphatase: 51 U/L (ref 33–130)
Bilirubin, Direct: 0 mg/dL (ref <=0.2)
Indirect Bilirubin: 0.3 mg/dL (ref 0.2–1.2)
Total Bilirubin: 0.3 mg/dL (ref 0.2–1.2)
Total Protein: 6.3 g/dL (ref 6.1–8.1)

## 2016-04-13 LAB — TSH: TSH: 2.4 m[IU]/L

## 2016-04-13 MED ORDER — DIAZEPAM 10 MG PO TABS: 10.0000 mg | ORAL_TABLET | Freq: Four times a day (QID) | ORAL | 0 refills | Status: DC | PRN

## 2016-04-13 NOTE — Progress Notes (Signed)
Assessment and Plan:  Hypertension:  -Continue medication -monitor blood pressure at home. -Continue DASH diet -Reminder to go to the ER if any CP, SOB, nausea, dizziness, severe HA, changes vision/speech, left arm numbness and tingling and jaw pain.  Cholesterol - Continue diet and exercise -Check cholesterol.   Diabetes without complications -Continue diet and exercise.  -Check A1C  Vitamin D Def -check level -continue medications.   Chest pains -d-dimer -referral to cardiology -may need repeat stress test -possible printzmetal angina given timing  Continue diet and meds as discussed. Further disposition pending results of labs. Discussed med's effects and SE's.    HPI 65 y.o. female  presents for 3 month follow up with hypertension, hyperlipidemia, diabetes and vitamin D deficiency.   Her blood pressure has been controlled at home, today their BP is BP: 132/84.She does not workout. She denies chest pain, shortness of breath, dizziness.   She is on cholesterol medication and denies myalgias. Her cholesterol is at goal. The cholesterol was:  11/06/2015: Cholesterol 185; HDL 61; LDL Cholesterol 99; Triglycerides 123   She has been working on diet and exercise for diabetes without complications, she is on bASA, she is on ACE/ARB, and denies  foot ulcerations, hyperglycemia, hypoglycemia , increased appetite, nausea, paresthesia of the feet, polydipsia, polyuria, visual disturbances, vomiting and weight loss. Last A1C was: 11/06/2015: Hgb A1c MFr Bld 6.0   Patient is on Vitamin D supplement. 11/06/2015: Vit D, 25-Hydroxy 54  Patient reports that she has been having some intermittent chest pains which have woken her from sleep.  She reports that it has been causing her some anxiety.   She does get more short of breath and also sometimes feels like she is more tired than usual with activity.  She reports that she does not have the pain during exertion.  She does typically get it first  thing in the early morning hours.  She reports that the pain last for 5 minutes and she felt like she was going to die.   Family history includes her maternal grandfather dying of a heart attack.  She does not know how old he was when this happened.  She reports that he thinks that he was in his fifties.  No siblings or first degree relatives with heart trouble.  She has followed with Dr. Salena Saner in the past.  She did recently travel to [place] and did have some right foot swelling.  She had the pain mostly when she was in [place].     Current Medications:  Current Outpatient Prescriptions on File Prior to Visit  Medication Sig Dispense Refill  . cetirizine (ZYRTEC) 10 MG tablet Take 10 mg by mouth daily.    . Cholecalciferol (VITAMIN D3) 5000 UNITS CAPS Take 5,000 Units by mouth every Monday, Wednesday, and Friday.    . cyclobenzaprine (FLEXERIL) 10 MG tablet Take 1 tablet by mouth 2 (two) times daily. Take 1 tab bid prn    . diazepam (VALIUM) 10 MG tablet Take 1 tablet (10 mg total) by mouth every 6 (six) hours as needed for anxiety. 90 tablet 0  . fluticasone (FLONASE) 50 MCG/ACT nasal spray Place 1 spray into both nostrils daily. 16 g 2  . naproxen (NAPROSYN) 500 MG tablet Take 1 tablet by mouth as needed.    . pantoprazole (PROTONIX) 40 MG tablet Take 1 tablet (40 mg total) by mouth daily. 90 tablet 3  . pravastatin (PRAVACHOL) 40 MG tablet Take 1 tablet (40 mg total) by mouth  at bedtime. 90 tablet 0  . triamterene-hydrochlorothiazide (MAXZIDE-25) 37.5-25 MG tablet Take 1 tablet by mouth at bedtime. 90 tablet 3  . zolpidem (AMBIEN) 5 MG tablet Take 1/2 to 1 tablet at hour of sleep 30 tablet 5   No current facility-administered medications on file prior to visit.    Medical History:  Past Medical History:  Diagnosis Date  . Anxiety   . Depression   . Difficulty sleeping    takes Ambien  . Diverticulosis 5/1/5  . DJD (degenerative joint disease)   . Family history of anesthesia complication     "mother quit breathing" 30 yrs ago pt does not know any more details. Pt has never had any problems with anesthesia herself  . GERD (gastroesophageal reflux disease)   . Hyperlipidemia   . Hypertension   . Obese   . Peripheral edema   . Psoriasis    HANDS  . Psoriasis   . Unspecified vitamin D deficiency   . Urgency of urination    Allergies:  Allergies  Allergen Reactions  . Levaquin [Levofloxacin In D5w]     yeast  . Oruvail [Ketoprofen]     GI upset     Review of Systems:  Review of Systems  Constitutional: Negative for chills, fever and malaise/fatigue.  HENT: Negative for congestion, ear pain and sore throat.   Eyes: Negative.   Respiratory: Positive for shortness of breath. Negative for cough and wheezing.   Cardiovascular: Positive for chest pain. Negative for palpitations and leg swelling.  Gastrointestinal: Negative for abdominal pain, blood in stool, constipation, diarrhea, heartburn and melena.  Genitourinary: Negative.   Skin: Negative.   Neurological: Negative for dizziness, sensory change, loss of consciousness and headaches.  Psychiatric/Behavioral: Negative for depression. The patient is not nervous/anxious and does not have insomnia.     Family history- Review and unchanged  Social history- Review and unchanged  Physical Exam: BP 132/84   Pulse 80   Temp 98.2 F (36.8 C) (Temporal)   Resp 16   Ht 5' 4.5" (1.638 m)   Wt 229 lb (103.9 kg)   BMI 38.70 kg/m  Wt Readings from Last 3 Encounters:  04/13/16 229 lb (103.9 kg)  11/06/15 218 lb (98.9 kg)  07/02/15 213 lb (96.6 kg)   General Appearance: Well nourished well developed, non-toxic appearing, in no apparent distress. Eyes: PERRLA, EOMs, conjunctiva no swelling or erythema ENT/Mouth: Ear canals clear with no erythema, swelling, or discharge.  TMs normal bilaterally, oropharynx clear, moist, with no exudate.   Neck: Supple, thyroid normal, no JVD, no cervical adenopathy.  Respiratory:  Respiratory effort normal, breath sounds clear A&P, no wheeze, rhonchi or rales noted.  No retractions, no accessory muscle usage Cardio: RRR with no MRGs. No noted edema.  Abdomen: Soft, + BS.  Non tender, no guarding, rebound, hernias, masses. Musculoskeletal: Full ROM, 5/5 strength, Normal gait Skin: Warm, dry without rashes, lesions, ecchymosis.  Neuro: Awake and oriented X 3, Cranial nerves intact. No cerebellar symptoms.  Psych: normal affect, Insight and Judgment appropriate.    Terri Piedra, PA-C 5:01 PM [facility]

## 2016-04-14 LAB — HEMOGLOBIN A1C
Hgb A1c MFr Bld: 5.4 % (ref <5.7)
MEAN PLASMA GLUCOSE: 108 mg/dL

## 2016-04-14 LAB — D-DIMER, QUANTITATIVE: D-Dimer, Quant: 0.32 mcg/mL FEU (ref <0.50)

## 2016-04-28 ENCOUNTER — Encounter: Payer: Self-pay | Admitting: Cardiovascular Disease

## 2016-04-28 ENCOUNTER — Ambulatory Visit (INDEPENDENT_AMBULATORY_CARE_PROVIDER_SITE_OTHER): Payer: BLUE CROSS/BLUE SHIELD | Admitting: Cardiovascular Disease

## 2016-04-28 VITALS — BP 167/85 | HR 90 | Ht 65.0 in | Wt 226.6 lb

## 2016-04-28 DIAGNOSIS — K219 Gastro-esophageal reflux disease without esophagitis: Secondary | ICD-10-CM | POA: Diagnosis not present

## 2016-04-28 DIAGNOSIS — I10 Essential (primary) hypertension: Secondary | ICD-10-CM | POA: Diagnosis not present

## 2016-04-28 DIAGNOSIS — IMO0001 Reserved for inherently not codable concepts without codable children: Secondary | ICD-10-CM

## 2016-04-28 DIAGNOSIS — E7800 Pure hypercholesterolemia, unspecified: Secondary | ICD-10-CM

## 2016-04-28 DIAGNOSIS — E6609 Other obesity due to excess calories: Secondary | ICD-10-CM

## 2016-04-28 DIAGNOSIS — R0789 Other chest pain: Secondary | ICD-10-CM | POA: Diagnosis not present

## 2016-04-28 DIAGNOSIS — Z6837 Body mass index (BMI) 37.0-37.9, adult: Secondary | ICD-10-CM

## 2016-04-28 NOTE — Patient Instructions (Signed)
Medication Instructions: Dr Croitoru recommends that you continue on your current medications as directed. Please refer to the Current Medication list given to you today.  Labwork: NONE ORDERED  Testing/Procedures: 1. Echocardiogram - Your physician has requested that you have an echocardiogram. Echocardiography is a painless test that uses sound waves to create images of your heart. It provides your doctor with information about the size and shape of your heart and how well your heart's chambers and valves are working. This procedure takes approximately one hour. There are no restrictions for this procedure. This will be performed at our [address] location - [address], [address].  2. Exercise Tolerance Test (GXT) - Your physician has requested that you have an exercise tolerance test. For further information please visit [email or web address]. Please also follow instruction sheet, as given.  Follow-up: Dr Croitoru recommends that you schedule a follow-up appointment in 1 year. You will receive a reminder letter in the mail two months in advance. If you don't receive a letter, please call our office to schedule the follow-up appointment.  If you need a refill on your cardiac medications before your next appointment, please call your pharmacy. 

## 2016-04-28 NOTE — Progress Notes (Signed)
Cardiology Office Note    Date:  04/28/2016   ID:  Jade Singh, DOB 07-Aug-1950, MRN [id number]  PCP:  Nadean Corwin, MD  Cardiologist:   Thurmon Fair, MD   Chief Complaint  Patient presents with  . Follow-up    sob; frequently. sharp chest pain. dizziness; occasionally. cramping in legs frequently.     History of Present Illness:  Jade Singh is a 65 y.o. female hypertension, hyperlipidemia, obesity and a strong family history of coronary artery disease. She had a extensive evaluation in 2013 when both her echocardiogram and her stress test were normal. She managed to lose 40 pounds in 2 years using Weight [employer] but has gained roughly half of that weight back over the last couple of years.   She generally has been feeling well for a year, but recently has had some problems. She was started on [place] seems to have developed depression. She was crying repeatedly during her Antabuse today. She is feeling very down, but denies any suicidal thoughts. She thinks she'll have to stop this medication.  About 3 weeks ago she was on vacation in [place] and woke up in the middle of night with severe crushing chest pain and dyspnea. She took an aspirin but did not seek medical attention. Her symptoms gradually subsided. The next day she was able to swim and snorkel with the turtles without any complaints and she has not had recurrent chest pain since. She is however very worried and anxious.  The patient specifically denies any chest pain with exertion, dyspnea with exertion, orthopnea, paroxysmal nocturnal dyspnea, syncope, palpitations, focal neurological deficits, intermittent claudication, lower extremity edema, unexplained weight gain, cough, hemoptysis or wheezing.     Past Medical History:  Diagnosis Date  . Anxiety   . Depression   . Difficulty sleeping    takes Ambien  . Diverticulosis 5/1/5  . DJD (degenerative joint disease)   . Family history of anesthesia  complication    "mother quit breathing" 30 yrs ago pt does not know any more details. Pt has never had any problems with anesthesia herself  . GERD (gastroesophageal reflux disease)   . Hyperlipidemia   . Hypertension   . Obese   . Peripheral edema   . Psoriasis    HANDS  . Psoriasis   . Unspecified vitamin D deficiency   . Urgency of urination     Past Surgical History:  Procedure Laterality Date  . ABDOMINAL HYSTERECTOMY     partial  . APPENDECTOMY    . CARDIAC CATHETERIZATION  2005    no problems per pt   . KNEE ARTHROSCOPY     RT 2011, LT 2009  . TONSILLECTOMY AND ADENOIDECTOMY    . TOTAL KNEE ARTHROPLASTY Right 02/28/2014   Procedure: RIGHT TOTAL KNEE ARTHROPLASTY;  Surgeon: Javier Docker, MD;  Location: [facility];  Service: Orthopedics;  Laterality: Right;  . TUBAL LIGATION      Current Medications: Outpatient Medications Prior to Visit  Medication Sig Dispense Refill  . aspirin 325 MG tablet Take 325 mg by mouth daily.    . cetirizine (ZYRTEC) 10 MG tablet Take 10 mg by mouth daily.    . Cholecalciferol (VITAMIN D3) 5000 UNITS CAPS Take 5,000 Units by mouth every Monday, Wednesday, and Friday.    . cyclobenzaprine (FLEXERIL) 10 MG tablet Take 1 tablet by mouth 2 (two) times daily. Take 1 tab bid prn    . diazepam (VALIUM) 10 MG tablet Take 1 tablet (  10 mg total) by mouth every 6 (six) hours as needed for anxiety. 90 tablet 0  . fluticasone (FLONASE) 50 MCG/ACT nasal spray Place 1 spray into both nostrils daily. 16 g 2  . naproxen (NAPROSYN) 500 MG tablet Take 1 tablet by mouth as needed.    . pantoprazole (PROTONIX) 40 MG tablet Take 1 tablet (40 mg total) by mouth daily. 90 tablet 3  . pravastatin (PRAVACHOL) 40 MG tablet Take 1 tablet (40 mg total) by mouth at bedtime. 90 tablet 0  . triamterene-hydrochlorothiazide (MAXZIDE-25) 37.5-25 MG tablet Take 1 tablet by mouth at bedtime. 90 tablet 3  . zolpidem (AMBIEN) 5 MG tablet Take 1/2 to 1 tablet at hour of sleep 30  tablet 5   No facility-administered medications prior to visit.      Allergies:   Levaquin [levofloxacin in d5w] and Oruvail [ketoprofen]   Social History   Social History  . Marital status: Married    Spouse name: N/A  . Number of children: N/A  . Years of education: N/A   Social History Main Topics  . Smoking status: Former Smoker    Quit date: 07/19/2000  . Smokeless tobacco: Never Used  . Alcohol use Yes     Comment: occ  . Drug use: No  . Sexual activity: Not Asked   Other Topics Concern  . None   Social History Narrative  . None     Family History:  The patient's family history includes Cancer (age of onset: 3949) in her brother; Diabetes in her paternal uncle; Heart attack in her maternal grandfather and mother; Heart disease in her paternal grandfather; Hyperlipidemia in her mother; Stroke in her mother.   ROS:   Please see the history of present illness.    ROS All other systems reviewed and are negative.   PHYSICAL EXAM:   VS:  BP (!) 167/85   Pulse 90   Ht 5\' 5"  (1.651 m)   Wt 226 lb 9.6 oz (102.8 kg)   BMI 37.71 kg/m    GEN: Well nourished, well developed, in no acute distress , Moderately obese HEENT: normal  Neck: no JVD, carotid bruits, or masses Cardiac: RRR; no murmurs, rubs, or gallops,no edema  Respiratory:  clear to auscultation bilaterally, normal work of breathing GI: soft, nontender, nondistended, + BS MS: no deformity or atrophy  Skin: warm and dry, psoriatic rash on her hands Neuro:  Alert and Oriented x 3, Strength and sensation are intact Psych: euthymic mood, full affect  Wt Readings from Last 3 Encounters:  04/28/16 226 lb 9.6 oz (102.8 kg)  04/13/16 229 lb (103.9 kg)  11/06/15 218 lb (98.9 kg)      Studies/Labs Reviewed:   EKG:  EKG is ordered today.  The ekg ordered today demonstrates Normal sinus rhythm with a single PAC, QTC 442 ms. No evidence of any acute repolarization abnormalities or previous myocardial  infarction.  Recent Labs: 11/06/2015: Magnesium 2.0 04/13/2016: ALT 18; BUN 25; Creat 0.84; Hemoglobin 14.2; Platelets 235; Potassium 3.9; Sodium 141; TSH 2.40   Lipid Panel    Component Value Date/Time   CHOL 189 04/13/2016 1724   TRIG 122 04/13/2016 1724   HDL 67 04/13/2016 1724   CHOLHDL 2.8 04/13/2016 1724   VLDL 24 04/13/2016 1724   LDLCALC 98 04/13/2016 1724      ASSESSMENT:    1. Other chest pain   2. Essential hypertension   3. Hypercholesterolemia   4. Gastroesophageal reflux disease, esophagitis presence not  specified   5. Class 2 obesity due to excess calories with serious comorbidity and body mass index (BMI) of 37.0 to 37.9 in adult      PLAN:  In order of problems listed above:  1. Chest pain: The latest possible that her nocturnal chest discomfort represented angina, there is no residual evidence of ischemic injury on her EKG and she has not had recurrent chest pain. I don't think coronary angiography is indicated. Having said that, she has numerous coronary risk factors. She will have a stress test and echocardiogram. 2. HTN: Her blood pressure is high today but she is clearly very emotional. Her blood pressure improved a little bit but remained elevated even towards the end of her exam. 3. HLP: All parameters at goal 4. GERD: Esophageal reflux and/or esophageal spasm could also explain her nocturnal pain 5. Obesity: Today was not a good day to point out her weight gain. She was clearly very upset. 6. Depression?: She was definitely distraught and the timing of her symptoms seems to coincide with the use of Otezla, which is known to cause depression. she is planning to discontinue this medication.    Medication Adjustments/Labs and Tests Ordered: Current medicines are reviewed at length with the patient today.  Concerns regarding medicines are outlined above.  Medication changes, Labs and Tests ordered today are listed in the Patient Instructions  below. Patient Instructions  Medication Instructions: Dr Royann Shivers recommends that you continue on your current medications as directed. Please refer to the Current Medication list given to you today.  Labwork: NONE ORDERED  Testing/Procedures: 1. Echocardiogram - Your physician has requested that you have an echocardiogram. Echocardiography is a painless test that uses sound waves to create images of your heart. It provides your doctor with information about the size and shape of your heart and how well your heart's chambers and valves are working. This procedure takes approximately one hour. There are no restrictions for this procedure. This will be performed at our [facility] location - [address], [address].  2. Exercise Tolerance Test (GXT) - Your physician has requested that you have an exercise tolerance test. For further information please visit [email or web address] Please also follow instruction sheet, as given.  Follow-up: Dr Royann Shivers recommends that you schedule a follow-up appointment in 1 year. You will receive a reminder letter in the mail two months in advance. If you don't receive a letter, please call our office to schedule the follow-up appointment.  If you need a refill on your cardiac medications before your next appointment, please call your pharmacy.    Signed, Thurmon Fair, MD  04/28/2016 3:16 PM    [facility] [address] [place], [place], [place]  [address] Phone: [phone]; Fax: [phone]

## 2016-04-30 ENCOUNTER — Telehealth (HOSPITAL_COMMUNITY): Payer: Self-pay

## 2016-04-30 NOTE — Telephone Encounter (Signed)
Encounter complete. 

## 2016-05-05 ENCOUNTER — Inpatient Hospital Stay (HOSPITAL_COMMUNITY): Admission: RE | Admit: 2016-05-05 | Payer: BLUE CROSS/BLUE SHIELD | Source: Ambulatory Visit

## 2016-05-12 ENCOUNTER — Other Ambulatory Visit: Payer: Self-pay

## 2016-05-12 ENCOUNTER — Ambulatory Visit (HOSPITAL_COMMUNITY): Payer: BLUE CROSS/BLUE SHIELD | Attending: Cardiovascular Disease

## 2016-05-12 DIAGNOSIS — E669 Obesity, unspecified: Secondary | ICD-10-CM | POA: Insufficient documentation

## 2016-05-12 DIAGNOSIS — I10 Essential (primary) hypertension: Secondary | ICD-10-CM | POA: Insufficient documentation

## 2016-05-12 DIAGNOSIS — Z6837 Body mass index (BMI) 37.0-37.9, adult: Secondary | ICD-10-CM | POA: Diagnosis not present

## 2016-05-12 DIAGNOSIS — D649 Anemia, unspecified: Secondary | ICD-10-CM | POA: Insufficient documentation

## 2016-05-12 DIAGNOSIS — E785 Hyperlipidemia, unspecified: Secondary | ICD-10-CM | POA: Insufficient documentation

## 2016-05-12 DIAGNOSIS — R7303 Prediabetes: Secondary | ICD-10-CM | POA: Diagnosis not present

## 2016-05-12 DIAGNOSIS — R079 Chest pain, unspecified: Secondary | ICD-10-CM | POA: Diagnosis present

## 2016-05-12 DIAGNOSIS — R0789 Other chest pain: Secondary | ICD-10-CM | POA: Diagnosis not present

## 2016-05-22 ENCOUNTER — Other Ambulatory Visit: Payer: Self-pay | Admitting: Cardiovascular Disease

## 2016-05-22 ENCOUNTER — Other Ambulatory Visit: Payer: Self-pay | Admitting: Internal Medicine

## 2016-05-24 NOTE — Telephone Encounter (Signed)
Rx request sent to pharmacy.  

## 2016-06-05 ENCOUNTER — Other Ambulatory Visit: Payer: Self-pay | Admitting: Internal Medicine

## 2016-06-05 DIAGNOSIS — G4700 Insomnia, unspecified: Secondary | ICD-10-CM

## 2016-06-09 ENCOUNTER — Telehealth (HOSPITAL_COMMUNITY): Payer: Self-pay

## 2016-06-09 NOTE — Telephone Encounter (Signed)
Encounter complete. 

## 2016-06-15 ENCOUNTER — Ambulatory Visit (HOSPITAL_COMMUNITY)
Admission: RE | Admit: 2016-06-15 | Discharge: 2016-06-15 | Disposition: A | Discharge status: Home / Self Care | Payer: BLUE CROSS/BLUE SHIELD | Source: Ambulatory Visit | Attending: Cardiovascular Disease | Admitting: Cardiovascular Disease

## 2016-06-15 DIAGNOSIS — R0789 Other chest pain: Secondary | ICD-10-CM

## 2016-06-15 LAB — EXERCISE TOLERANCE TEST
CHL CUP MPHR: 157 {beats}/min
CHL RATE OF PERCEIVED EXERTION: 17
CSEPED: 4 min
CSEPHR: 93 %
Estimated workload: 6.6 METS
Exercise duration (sec): 40 s
Peak HR: 146 {beats}/min
Rest HR: 82 {beats}/min

## 2016-07-06 ENCOUNTER — Ambulatory Visit (INDEPENDENT_AMBULATORY_CARE_PROVIDER_SITE_OTHER): Payer: BLUE CROSS/BLUE SHIELD | Admitting: Internal Medicine

## 2016-07-06 ENCOUNTER — Encounter: Payer: Self-pay | Admitting: Internal Medicine

## 2016-07-06 VITALS — BP 148/84 | HR 72 | Temp 98.0°F | Resp 18 | Ht 65.0 in | Wt 230.0 lb

## 2016-07-06 DIAGNOSIS — I10 Essential (primary) hypertension: Secondary | ICD-10-CM | POA: Diagnosis not present

## 2016-07-06 DIAGNOSIS — Z79899 Other long term (current) drug therapy: Secondary | ICD-10-CM | POA: Diagnosis not present

## 2016-07-06 DIAGNOSIS — E7800 Pure hypercholesterolemia, unspecified: Secondary | ICD-10-CM

## 2016-07-06 DIAGNOSIS — E559 Vitamin D deficiency, unspecified: Secondary | ICD-10-CM

## 2016-07-06 DIAGNOSIS — R7303 Prediabetes: Secondary | ICD-10-CM

## 2016-07-06 DIAGNOSIS — J069 Acute upper respiratory infection, unspecified: Secondary | ICD-10-CM | POA: Diagnosis not present

## 2016-07-06 LAB — CBC WITH DIFFERENTIAL/PLATELET
BASOS ABS: 0 {cells}/uL (ref 0–200)
BASOS PCT: 0 %
EOS ABS: 0 {cells}/uL — AB (ref 15–500)
EOS PCT: 0 %
HCT: 44.7 % (ref 35.0–45.0)
Hemoglobin: 14.2 g/dL (ref 11.7–15.5)
LYMPHS PCT: 15 %
Lymphs Abs: 1845 cells/uL (ref 850–3900)
MCH: 28.1 pg (ref 27.0–33.0)
MCHC: 31.8 g/dL — ABNORMAL LOW (ref 32.0–36.0)
MCV: 88.5 fL (ref 80.0–100.0)
MONOS PCT: 2 %
MPV: 11 fL (ref 7.5–12.5)
Monocytes Absolute: 246 cells/uL (ref 200–950)
NEUTROS ABS: 10209 {cells}/uL — AB (ref 1500–7800)
Neutrophils Relative %: 83 %
PLATELETS: 303 10*3/uL (ref 140–400)
RBC: 5.05 MIL/uL (ref 3.80–5.10)
RDW: 14 % (ref 11.0–15.0)
WBC: 12.3 10*3/uL — ABNORMAL HIGH (ref 3.8–10.8)

## 2016-07-06 LAB — TSH: TSH: 0.61 mIU/L

## 2016-07-06 MED ORDER — AMOXICILLIN-POT CLAVULANATE 875-125 MG PO TABS: 1.0000 | ORAL_TABLET | Freq: Two times a day (BID) | ORAL | 0 refills | Status: DC

## 2016-07-06 MED ORDER — DEXAMETHASONE SODIUM PHOSPHATE 10 MG/ML IJ SOLN: 10.0000 mg | Freq: Once | INTRAMUSCULAR | Status: DC

## 2016-07-06 MED ORDER — PRAVASTATIN SODIUM 40 MG PO TABS: 40.0000 mg | ORAL_TABLET | Freq: Every day | ORAL | 0 refills | Status: DC

## 2016-07-06 MED ORDER — PROMETHAZINE-DM 6.25-15 MG/5ML PO SYRP: 5.0000 mL | ORAL_SOLUTION | Freq: Four times a day (QID) | ORAL | 0 refills | Status: DC | PRN

## 2016-07-06 NOTE — Progress Notes (Signed)
Assessment and Plan:  Hypertension:  -Continue medication,  -monitor blood pressure at home.  -Continue DASH diet.   -Reminder to go to the ER if any CP, SOB, nausea, dizziness, severe HA, changes vision/speech, left arm numbness and tingling, and jaw pain.  Cholesterol: -Continue diet and exercise.  -Check cholesterol.   Pre-diabetes: -Continue diet and exercise.  -Check A1C  Vitamin D Def: -check level -continue medications.   Sinusitis -augmentin -cont zyrtec -cont flonase -decadron injection -phenergan dm  Continue diet and meds as discussed. Further disposition pending results of labs.  HPI 65 y.o. female  presents for 3 month follow up with hypertension, hyperlipidemia, prediabetes and vitamin D.   Her blood pressure has been controlled at home, today their BP is BP: (!) 148/84.   She does not workout. She denies chest pain, shortness of breath, dizziness.   She is on cholesterol medication and denies myalgias. Her cholesterol is at goal. The cholesterol last visit was:   Lab Results  Component Value Date   CHOL 189 04/13/2016   HDL 67 04/13/2016   LDLCALC 98 04/13/2016   TRIG 122 04/13/2016   CHOLHDL 2.8 04/13/2016     She has been working on diet and exercise for prediabetes, and denies foot ulcerations, hyperglycemia, hypoglycemia , increased appetite, nausea, paresthesia of the feet, polydipsia, polyuria, visual disturbances, vomiting and weight loss. Last A1C in the office was:  Lab Results  Component Value Date   HGBA1C 5.4 04/13/2016    Patient is on Vitamin D supplement.  Lab Results  Component Value Date   VD25OH 8354 11/06/2015     She reports that she thinks that she has a sinus infection.  She reports that her head hurts, she has sinus congestion,  Red and green nasal sputum.  She has been doing flonase occasionally and then has also been taking aleve for her headache.  She reports that she is not doing nettie pots.  She is doing zyrtec.  She  reports that she can't do OTC meds because they make her sick to her stomach.     Current Medications:  Current Outpatient Prescriptions on File Prior to Visit  Medication Sig Dispense Refill  . aspirin 325 MG tablet Take 325 mg by mouth daily.    . cetirizine (ZYRTEC) 10 MG tablet Take 10 mg by mouth daily.    . Cholecalciferol (VITAMIN D3) 5000 UNITS CAPS Take 5,000 Units by mouth every Monday, Wednesday, and Friday.    . diazepam (VALIUM) 10 MG tablet Take 1 tablet (10 mg total) by mouth every 6 (six) hours as needed for anxiety. 90 tablet 0  . fluticasone (FLONASE) 50 MCG/ACT nasal spray Place 1 spray into both nostrils daily. 16 g 2  . naproxen (NAPROSYN) 500 MG tablet Take 1 tablet by mouth as needed.    . pantoprazole (PROTONIX) 40 MG tablet Take 1 tablet (40 mg total) by mouth daily. 90 tablet 3  . pravastatin (PRAVACHOL) 40 MG tablet TAKE ONE TABLET BY MOUTH ONCE DAILY AT BEDTIME 90 tablet 0  . triamterene-hydrochlorothiazide (MAXZIDE-25) 37.5-25 MG tablet TAKE ONE TABLET BY MOUTH ONCE DAILY AT BEDTIME 90 tablet 3  . zolpidem (AMBIEN) 5 MG tablet TAKE 1/2 TO 1 TABLET AN HOUR BEFORE SLEEP 30 tablet 5   No current facility-administered medications on file prior to visit.     Medical History:  Past Medical History:  Diagnosis Date  . Anxiety   . Depression   . Difficulty sleeping  takes Ambien  . Diverticulosis 5/1/5  . DJD (degenerative joint disease)   . Family history of anesthesia complication    "mother quit breathing" 30 yrs ago pt does not know any more details. Pt has never had any problems with anesthesia herself  . GERD (gastroesophageal reflux disease)   . Hyperlipidemia   . Hypertension   . Obese   . Peripheral edema   . Psoriasis    HANDS  . Psoriasis   . Unspecified vitamin D deficiency   . Urgency of urination     Allergies:  Allergies  Allergen Reactions  . Levaquin [Levofloxacin In D5w]     yeast  . Oruvail [Ketoprofen]     GI upset      Review of Systems:  Review of Systems  Constitutional: Negative for chills, fever and malaise/fatigue.  HENT: Positive for congestion, ear pain and sore throat.   Eyes: Negative.   Respiratory: Negative for cough, shortness of breath and wheezing.   Cardiovascular: Negative for chest pain, palpitations and leg swelling.  Gastrointestinal: Negative for abdominal pain, blood in stool, constipation, diarrhea, heartburn and melena.  Genitourinary: Negative.   Skin: Negative.   Neurological: Positive for headaches. Negative for dizziness, sensory change and loss of consciousness.  Psychiatric/Behavioral: Negative for depression. The patient is not nervous/anxious and does not have insomnia.     Family history- Review and unchanged  Social history- Review and unchanged  Physical Exam: BP (!) 148/84   Pulse 72   Temp 98 F (36.7 C) (Temporal)   Resp 18   Ht 5\' 5"  (1.651 m)   Wt 230 lb (104.3 kg)   BMI 38.27 kg/m  Wt Readings from Last 3 Encounters:  07/06/16 230 lb (104.3 kg)  04/28/16 226 lb 9.6 oz (102.8 kg)  04/13/16 229 lb (103.9 kg)    General Appearance: Well nourished well developed, in no apparent distress. Eyes: PERRLA, EOMs, conjunctiva no swelling or erythema ENT/Mouth: Ear canals normal without obstruction, swelling, erythma, discharge.  TMs normal bilaterally.  Oropharynx moist, clear, without exudate, or postoropharyngeal swelling. Neck: Supple, thyroid normal,no cervical adenopathy  Respiratory: Respiratory effort normal, Breath sounds clear A&P without rhonchi, wheeze, or rale.  No retractions, no accessory usage. Cardio: RRR with no MRGs. Brisk peripheral pulses without edema.  Abdomen: Soft, + BS,  Non tender, no guarding, rebound, hernias, masses. Musculoskeletal: Full ROM, 5/5 strength, Normal gait Skin: Warm, dry without rashes, lesions, ecchymosis.  Neuro: Awake and oriented X 3, Cranial nerves intact. Normal muscle tone, no cerebellar symptoms. Psych:  Normal affect, Insight and Judgment appropriate.    Terri Piedraourtney Forcucci, PA-C 4:35 PM [facility]

## 2016-07-07 LAB — BASIC METABOLIC PANEL WITH GFR
BUN: 35 mg/dL — AB (ref 7–25)
CALCIUM: 9.5 mg/dL (ref 8.6–10.4)
CHLORIDE: 104 mmol/L (ref 98–110)
CO2: 24 mmol/L (ref 20–31)
CREATININE: 0.84 mg/dL (ref 0.50–0.99)
GFR, EST AFRICAN AMERICAN: 85 mL/min (ref >=60)
GFR, Est Non African American: 74 mL/min (ref >=60)
Glucose, Bld: 121 mg/dL — ABNORMAL HIGH (ref 65–99)
Potassium: 4.8 mmol/L (ref 3.5–5.3)
SODIUM: 139 mmol/L (ref 135–146)

## 2016-07-07 LAB — LIPID PANEL
CHOL/HDL RATIO: 2.8 ratio (ref <5.0)
CHOLESTEROL: 224 mg/dL — AB (ref <200)
HDL: 80 mg/dL (ref >50)
LDL Cholesterol: 126 mg/dL — ABNORMAL HIGH (ref <100)
TRIGLYCERIDES: 89 mg/dL (ref <150)
VLDL: 18 mg/dL (ref <30)

## 2016-07-07 LAB — HEPATIC FUNCTION PANEL
ALBUMIN: 4.1 g/dL (ref 3.6–5.1)
ALT: 15 U/L (ref 6–29)
AST: 12 U/L (ref 10–35)
Alkaline Phosphatase: 48 U/L (ref 33–130)
BILIRUBIN DIRECT: 0.1 mg/dL (ref <=0.2)
BILIRUBIN TOTAL: 0.3 mg/dL (ref 0.2–1.2)
Indirect Bilirubin: 0.2 mg/dL (ref 0.2–1.2)
Total Protein: 6.6 g/dL (ref 6.1–8.1)

## 2016-07-07 LAB — HEMOGLOBIN A1C
Hgb A1c MFr Bld: 5.4 % (ref <5.7)
MEAN PLASMA GLUCOSE: 108 mg/dL

## 2016-10-09 ENCOUNTER — Other Ambulatory Visit: Payer: Self-pay | Admitting: Internal Medicine

## 2016-10-16 ENCOUNTER — Other Ambulatory Visit: Payer: Self-pay | Admitting: Internal Medicine

## 2016-10-16 NOTE — Telephone Encounter (Signed)
Please call Diazepam 

## 2016-10-18 NOTE — Telephone Encounter (Signed)
Valium was called into pharmacy on 2nd April 2018 @ 8:57am by DD.

## 2016-11-08 ENCOUNTER — Encounter: Payer: Self-pay | Admitting: Internal Medicine

## 2016-11-11 ENCOUNTER — Other Ambulatory Visit: Payer: Self-pay | Admitting: Orthopedic Surgery

## 2016-11-11 DIAGNOSIS — Z1231 Encounter for screening mammogram for malignant neoplasm of breast: Secondary | ICD-10-CM

## 2016-12-02 ENCOUNTER — Encounter: Payer: Self-pay | Admitting: Physician Assistant

## 2016-12-02 ENCOUNTER — Ambulatory Visit (INDEPENDENT_AMBULATORY_CARE_PROVIDER_SITE_OTHER): Payer: BLUE CROSS/BLUE SHIELD | Admitting: Physician Assistant

## 2016-12-02 VITALS — BP 140/90 | HR 86 | Temp 97.3°F | Resp 16 | Ht 64.5 in | Wt 236.8 lb

## 2016-12-02 DIAGNOSIS — K589 Irritable bowel syndrome without diarrhea: Secondary | ICD-10-CM

## 2016-12-02 DIAGNOSIS — D649 Anemia, unspecified: Secondary | ICD-10-CM

## 2016-12-02 DIAGNOSIS — K5790 Diverticulosis of intestine, part unspecified, without perforation or abscess without bleeding: Secondary | ICD-10-CM

## 2016-12-02 DIAGNOSIS — Z0000 Encounter for general adult medical examination without abnormal findings: Secondary | ICD-10-CM | POA: Diagnosis not present

## 2016-12-02 DIAGNOSIS — R6889 Other general symptoms and signs: Secondary | ICD-10-CM

## 2016-12-02 DIAGNOSIS — R0609 Other forms of dyspnea: Secondary | ICD-10-CM

## 2016-12-02 DIAGNOSIS — R7303 Prediabetes: Secondary | ICD-10-CM

## 2016-12-02 DIAGNOSIS — E559 Vitamin D deficiency, unspecified: Secondary | ICD-10-CM | POA: Diagnosis not present

## 2016-12-02 DIAGNOSIS — Z0001 Encounter for general adult medical examination with abnormal findings: Secondary | ICD-10-CM

## 2016-12-02 DIAGNOSIS — E7800 Pure hypercholesterolemia, unspecified: Secondary | ICD-10-CM

## 2016-12-02 DIAGNOSIS — M25561 Pain in right knee: Secondary | ICD-10-CM

## 2016-12-02 DIAGNOSIS — F3342 Major depressive disorder, recurrent, in full remission: Secondary | ICD-10-CM

## 2016-12-02 DIAGNOSIS — M25461 Effusion, right knee: Secondary | ICD-10-CM

## 2016-12-02 DIAGNOSIS — F411 Generalized anxiety disorder: Secondary | ICD-10-CM

## 2016-12-02 DIAGNOSIS — Z79899 Other long term (current) drug therapy: Secondary | ICD-10-CM | POA: Diagnosis not present

## 2016-12-02 DIAGNOSIS — I10 Essential (primary) hypertension: Secondary | ICD-10-CM

## 2016-12-02 DIAGNOSIS — Z6837 Body mass index (BMI) 37.0-37.9, adult: Secondary | ICD-10-CM

## 2016-12-02 DIAGNOSIS — K219 Gastro-esophageal reflux disease without esophagitis: Secondary | ICD-10-CM

## 2016-12-02 LAB — CBC WITH DIFFERENTIAL/PLATELET
BASOS ABS: 89 {cells}/uL (ref 0–200)
Basophils Relative: 1 %
EOS ABS: 267 {cells}/uL (ref 15–500)
Eosinophils Relative: 3 %
HEMATOCRIT: 43.8 % (ref 35.0–45.0)
HEMOGLOBIN: 13.8 g/dL (ref 11.7–15.5)
LYMPHS ABS: 3382 {cells}/uL (ref 850–3900)
Lymphocytes Relative: 38 %
MCH: 27.8 pg (ref 27.0–33.0)
MCHC: 31.5 g/dL — ABNORMAL LOW (ref 32.0–36.0)
MCV: 88.3 fL (ref 80.0–100.0)
MPV: 10.5 fL (ref 7.5–12.5)
Monocytes Absolute: 445 cells/uL (ref 200–950)
Monocytes Relative: 5 %
NEUTROS PCT: 53 %
Neutro Abs: 4717 cells/uL (ref 1500–7800)
Platelets: 229 10*3/uL (ref 140–400)
RBC: 4.96 MIL/uL (ref 3.80–5.10)
RDW: 14 % (ref 11.0–15.0)
WBC: 8.9 10*3/uL (ref 3.8–10.8)

## 2016-12-02 MED ORDER — IPRATROPIUM-ALBUTEROL 0.5-2.5 (3) MG/3ML IN SOLN: 3.0000 mL | Freq: Once | RESPIRATORY_TRACT | Status: DC

## 2016-12-02 NOTE — Patient Instructions (Signed)
Shortness of Breath, Adult Shortness of breath is when a person has trouble breathing enough air, or when a person feels like she or he is having trouble breathing in enough air. Shortness of breath could be a sign of medical problem. Follow these instructions at home: Pay attention to any changes in your symptoms. Take these actions to help with your condition:  Do not smoke. Smoking is a common cause of shortness of breath. If you smoke and you need help quitting, ask your health care provider.  Avoid things that can irritate your airways, such as:  Mold.  Dust.  Air pollution.  Chemical fumes.  Things that can cause allergy symptoms (allergens), if you have allergies.  Keep your living space clean and free of mold and dust.  Rest as needed. Slowly return to your usual activities.  Take over-the-counter and prescription medicines, including oxygen and inhaled medicines, only as told by your health care provider.  Keep all follow-up visits as told by your health care provider. This is important. Contact a health care provider if:  Your condition does not improve as soon as expected.  You have a hard time doing your normal activities, even after you rest.  You have new symptoms. Get help right away if:  Your shortness of breath gets worse.  You have shortness of breath when you are resting.  You feel light-headed or you faint.  You have a cough that is not controlled with medicines.  You cough up blood.  You have pain with breathing.  You have pain in your chest, arms, shoulders, or abdomen.  You have a fever.  You cannot walk up stairs or exercise the way that you normally do. This information is not intended to replace advice given to you by your health care provider. Make sure you discuss any questions you have with your health care provider. Document Released: 03/30/2001 Document Revised: 01/24/2016 Document Reviewed: 12/11/2015 Elsevier Interactive Patient  Education  2017 [employer]  Common causes of cough OR hoarseness OR sore throat:   Allergies, Viral Infections, Acid Reflux and Bacterial Infections.  1) Allergies and viral infections cause a cough OR sore throat by post nasal drip and are often worse at night, can also have sneezing, lower grade fevers, clear/yellow mucus. This is [place] treated with allergy medications or nasal sprays.  Please get on allegra for 1-2 weeks The strongest is allegra or fexafinadine  Cheapest at [employer], [employer], [employer]  2) Bacterial infections are more severe than allergies or viral infections with fever, teeth pain, fatigue. This can be treated with prednisone and the same over the counter medication and after 7 days can be treated with an antibiotic.   3) Silent reflux/GERD can cause a cough OR sore throat OR hoarseness WITHOUT heart burn because the esophagus that goes to the stomach and trachea that goes to the lungs are very close and when you lay down the acid can irritate your throat and lungs. This can cause hoarseness, cough, and wheezing. Please stop any alcohol or anti-inflammatories like aleve/advil/ibuprofen and start an over the counter Prilosec or omeprazole 1-2 times daily 30mins before food for 2 weeks, then switch to over the counter zantac/ratinidine or pepcid/famotadine once at night for 2 weeks.   4) sometimes irritation causes more irritation. Try voice rest, use sugar free cough drops to prevent coughing, and try to stop clearing your throat.   If you ever have a cough that does not go away after trying these things  please make a follow up visit for further evaluation or we can refer you to a specialist. Or if you ever have shortness of breath or chest pain go to the ER.      Simple math prevails.    1st - exercise does not produce significant weight loss - at [place] one converts fat into muscle , "bulks up", loses inches, but usually stays "weight neutral"     2nd - think of your body  weightas a check book: If you eat more calories than you burn up - you save money or gain weight .... Or if you spend more money than you put in the check book, ie burn up more calories than you eat, then you lose weight     3rd - if you walk or run 1 mile, you burn up 100 calories - you have to burn up 3,500 calories to lose 1 pound, ie you have to walk/run 35 miles to lose 1 measly pound. So if you want to lose 10 #, then you have to walk/run 350 miles, so.... clearly exercise is not the solution.     4. So if you consume 1,500 calories, then you have to burn up the equivalent of 15 miles to stay weight neutral - It also stands to reason that if you consume 1,500 cal/day and don't lose weight, then you must be burning up about 1,500 cals/day to stay weight neutral.     5. If you really want to lose weight, you must cut your calorie intake 300 calories /day and at that rate you should lose about 1 # every 3 days.   6. Please purchase Dr Francis Dowse Fuhrman's book(s) "The End of Dieting" & "Eat to Live" . It has some great concepts and recipes.

## 2016-12-02 NOTE — Progress Notes (Signed)
Complete Physical  Assessment and Plan:  Essential hypertension - continue medications, DASH diet, exercise and monitor at home. Call if greater than 130/80.  -     CBC with Differential/Platelet -     BASIC METABOLIC PANEL WITH GFR -     Hepatic function panel -     TSH -     Urinalysis, Routine w reflex microscopic -     Microalbumin / creatinine urine ratio -     DG Chest 2 View; Future  Hypercholesterolemia -continue medications, check lipids, decrease fatty foods, increase activity.  -     Lipid panel  Anemia, unspecified type -     Iron and TIBC -     Ferritin -     Vitamin B12  Class 2 severe obesity due to excess calories with serious comorbidity and body mass index (BMI) of 37.0 to 37.9 in adult [facility]) - long discussion about weight loss, diet, and exercise  Prediabetes Discussed general issues about diabetes pathophysiology and management., Educational material distributed., Suggested low cholesterol diet., Encouraged aerobic exercise., Discussed foot care., Reminded to get yearly retinal exam. -     Hemoglobin A1c  Diverticulosis of intestine without bleeding, unspecified intestinal tract location Increase fiber  Recurrent major depressive disorder, in full remission (HCC) Depression - continue medications, stress management techniques discussed, increase water, good sleep hygiene discussed, increase exercise, and increase veggies.   Anxiety state  Vitamin D deficiency -     VITAMIN D 25 Hydroxy (Vit-D Deficiency, Fractures)  Irritable bowel syndrome, unspecified type  Gastroesophageal reflux disease, esophagitis presence not specified Continue PPI/H2 blocker, diet discussed  Dyspnea on exertion -     ipratropium-albuterol (DUONEB) 0.5-2.5 (3) MG/3ML nebulizer solution 3 mL; Take 3 mLs by nebulization once. - will get CXR, get labs low risk DVT but swelling right leg, will get labs for ortho as well - no CP, no accompaniments - add zantac  - if any  worsening CP, SOB go to ER - will get PFTs for possible COPD, may benefit from daily inhaler.   Medication management -     Magnesium  Pain and swelling of right knee -     D-dimer, quantitative (not at [facility]) -     C-reactive protein -     Sedimentation rate   Discussed med's effects and SE's. Screening labs and tests as requested with regular follow-up as recommended. Future Appointments Date Time Provider Department Center  12/27/2016 4:20 PM [facility] MM 3 [facility]  12/05/2017 3:00 PM Quentin Mulling, PA-C [facility] None     HPI  66 y.o. female  presents for a complete physical.  Her blood pressure has been controlled at home, today their BP is BP: 140/90.  She does not workout. She denies chest pain, shortness of breath, dizziness. She has not been exercising much lately.  She reports that she and her husband have been working on their diet together.   Did have an episode of SOB, chest pain while she was in [place] last year, saw her cardiologist and she had normal echo and stress test in 04/2016 due to this. She has history of smoking 80 pack year history quit 16 years ago, last CXR 2015, Has been having SOB with walking, talking, and can just be sitting there x 2 months, feels can't have deep breath. No chest pain. She is on protonix daily. Coughs every morning but that is it, will occ have wheezing too.  Had surgery right knee 2  years ago, has been having swelling left knee, no personal or family history of blood clots, no estrogen.   She is on cholesterol medication and denies myalgias. Her cholesterol is at goal. The cholesterol last visit was:  Lab Results  Component Value Date   CHOL 224 (H) 07/06/2016   HDL 80 07/06/2016   LDLCALC 126 (H) 07/06/2016   TRIG 89 07/06/2016   CHOLHDL 2.8 07/06/2016  .  She has been working on diet and exercise for prediabetes, she is on bASA, she is on ACE/ARB and denies foot ulcerations, hyperglycemia, hypoglycemia , increased  appetite, nausea, paresthesia of the feet, polydipsia, polyuria, visual disturbances, vomiting and weight loss. Last A1C in the office was:  Lab Results  Component Value Date   HGBA1C 5.4 07/06/2016    Patient is on Vitamin D supplement.   Lab Results  Component Value Date   VD25OH 70 11/06/2015    She is also following with [facility] for the chronic rash on the palms and her soles.  She reports that she is still taking medication for it but feels that it is not doing much.  She does ask that we send a copy of labs to [facility].    Current Medications:  Current Outpatient Prescriptions on File Prior to Visit  Medication Sig Dispense Refill  . aspirin 325 MG tablet Take 325 mg by mouth daily.    . cetirizine (ZYRTEC) 10 MG tablet Take 10 mg by mouth daily.    . Cholecalciferol (VITAMIN D3) 5000 UNITS CAPS Take 5,000 Units by mouth every Monday, Wednesday, and Friday.    . diazepam (VALIUM) 10 MG tablet TAKE ONE TABLET BY MOUTH EVERY 6 HOURS AS NEEDED FOR ANXIETY 90 tablet 0  . fluticasone (FLONASE) 50 MCG/ACT nasal spray Place 1 spray into both nostrils daily. 16 g 2  . pantoprazole (PROTONIX) 40 MG tablet TAKE ONE TABLET BY MOUTH ONCE DAILY 90 tablet 1  . pravastatin (PRAVACHOL) 40 MG tablet Take 1 tablet (40 mg total) by mouth daily with breakfast. 90 tablet 0  . triamterene-hydrochlorothiazide (MAXZIDE-25) 37.5-25 MG tablet TAKE ONE TABLET BY MOUTH ONCE DAILY AT BEDTIME 90 tablet 3  . zolpidem (AMBIEN) 5 MG tablet TAKE 1/2 TO 1 TABLET AN HOUR BEFORE SLEEP 30 tablet 5   Current Facility-Administered Medications on File Prior to Visit  Medication Dose Route Frequency Provider Last Rate Last Dose  . dexamethasone (DECADRON) injection 10 mg  10 mg Intramuscular Once Terri Piedra, PA-C        Health Maintenance:   Immunization History  Administered Date(s) Administered  . Influenza Split 06/25/2014, 05/08/2015  . Influenza,inj,quad, With Preservative  04/13/2016  . Influenza-Unspecified 05/28/2013  . PPD Test 08/29/2013  . Pneumococcal Polysaccharide-23 06/07/2013  . Tdap 05/19/2009  . Zoster 06/25/2014    Tetanus: 2010 Pneumovax: 2014 Flu vaccine: 2016 Zostavax: 2015  Pap: Status post hysterectomy no cervix MGM: 12/25/2015 DEXA: 2013 Colonoscopy: 2015 EGD 2008 Last Dental Exam: Dr. Melynda Ripple due in May Last Eye Exam: [facility], due in May  Patient Care Team: [name], MD as PCP - General (Internal Medicine) Thurmon Fair, MD as Consulting Physician (Cardiology) Jene Every, MD as Consulting Physician (Orthopedic Surgery)  Medical History:  Past Medical History:  Diagnosis Date  . Anxiety   . Depression   . Difficulty sleeping    takes Ambien  . Diverticulosis 5/1/5  . DJD (degenerative joint disease)   . Family history of anesthesia complication    "  mother quit breathing" 30 yrs ago pt does not know any more details. Pt has never had any problems with anesthesia herself  . GERD (gastroesophageal reflux disease)   . Hyperlipidemia   . Hypertension   . Obese   . Peripheral edema   . Psoriasis    HANDS  . Psoriasis   . Unspecified vitamin D deficiency   . Urgency of urination    Allergies Allergies  Allergen Reactions  . Levaquin [Levofloxacin In D5w]     yeast  . Oruvail [Ketoprofen]     GI upset    SURGICAL HISTORY She  has a past surgical history that includes Knee arthroscopy; Appendectomy; Tubal ligation; Abdominal hysterectomy; Tonsillectomy and adenoidectomy; Cardiac catheterization (2005); and Total knee arthroplasty (Right, 02/28/2014). FAMILY HISTORY Her family history includes Cancer (age of onset: 3149) in her brother; Diabetes in her paternal uncle; Heart attack in her maternal grandfather and mother; Heart disease in her paternal grandfather; Hyperlipidemia in her mother; Stroke in her mother. SOCIAL HISTORY She  reports that she quit smoking about 16 years ago. She has never  used smokeless tobacco. She reports that she drinks alcohol. She reports that she does not use drugs. Smoked for 40 years, 2 packs a day.  Review of Systems: Review of Systems  Constitutional: Negative for chills, fever and malaise/fatigue.  HENT: Negative for congestion, ear pain and sore throat.   Eyes: Negative.   Respiratory: Positive for cough, shortness of breath and wheezing. Negative for hemoptysis and sputum production.   Cardiovascular: Negative for chest pain, palpitations and leg swelling.  Gastrointestinal: Negative for abdominal pain, blood in stool, constipation, diarrhea, heartburn and melena.  Genitourinary: Negative.   Skin: Negative for rash.  Neurological: Negative for dizziness, sensory change, loss of consciousness and headaches.  Psychiatric/Behavioral: Negative for depression. The patient is nervous/anxious. The patient does not have insomnia.     Physical Exam: Estimated body mass index is 40.02 kg/m as calculated from the following:   Height as of this encounter: 5' 4.5" (1.638 m).   Weight as of this encounter: 236 lb 12.8 oz (107.4 kg). BP 140/90   Pulse 86   Temp 97.3 F (36.3 C)   Resp 16   Ht 5' 4.5" (1.638 m)   Wt 236 lb 12.8 oz (107.4 kg)   SpO2 98%   BMI 40.02 kg/m   General Appearance: Well nourished well developed, in no apparent distress.  Eyes: PERRLA, EOMs, conjunctiva no swelling or erythema ENT/Mouth: Ear canals normal without obstruction, swelling, erythema, or discharge.  TMs normal bilaterally with no erythema, bulging, retraction, or loss of landmark.  Oropharynx moist and clear with no exudate, erythema, or swelling.   Neck: Supple, thyroid normal. No bruits.  No cervical adenopathy Respiratory: Respiratory effort normal, Breath sounds clear A&P without wheeze, rhonchi, rales.   Cardio: RRR without murmurs, rubs or gallops. Brisk peripheral pulses without edema.  Chest: symmetric, with normal excursions Breasts: Symmetric, without  lumps, nipple discharge, retractions.  Abdomen: Soft, nontender, no guarding, rebound, hernias, masses, or organomegaly.  Lymphatics: Non tender without lymphadenopathy.  Genitourinary: defer Musculoskeletal: Full ROM all peripheral extremities,5/5 strength, and normal gait.  Skin: Warm, dry without rashes, lesions, ecchymosis. Neuro: Awake and oriented X 3, Cranial nerves intact, reflexes equal bilaterally. Normal muscle tone, no cerebellar symptoms. Sensation intact.  Psych:  normal affect, Insight and Judgment appropriate.   EKG: declines sees CARDIO  AORTA SCAN: defer  Over 40 minutes of exam, counseling, chart review and critical  decision making was performed  Quentin Mulling 3:22 PM [facility]

## 2016-12-03 LAB — HEPATIC FUNCTION PANEL
ALT: 15 U/L (ref 6–29)
AST: 13 U/L (ref 10–35)
Albumin: 4.1 g/dL (ref 3.6–5.1)
Alkaline Phosphatase: 58 U/L (ref 33–130)
Bilirubin, Direct: 0.1 mg/dL (ref <=0.2)
Indirect Bilirubin: 0.2 mg/dL (ref 0.2–1.2)
Total Bilirubin: 0.3 mg/dL (ref 0.2–1.2)
Total Protein: 6.1 g/dL (ref 6.1–8.1)

## 2016-12-03 LAB — LIPID PANEL
Cholesterol: 173 mg/dL (ref <200)
HDL: 58 mg/dL (ref >50)
LDL Cholesterol: 95 mg/dL (ref <100)
Total CHOL/HDL Ratio: 3 Ratio (ref <5.0)
Triglycerides: 100 mg/dL (ref <150)
VLDL: 20 mg/dL (ref <30)

## 2016-12-03 LAB — TSH: TSH: 1.98 mIU/L

## 2016-12-03 LAB — BASIC METABOLIC PANEL WITH GFR
BUN: 25 mg/dL (ref 7–25)
CO2: 23 mmol/L (ref 20–31)
Calcium: 9 mg/dL (ref 8.6–10.4)
Chloride: 106 mmol/L (ref 98–110)
Creat: 0.85 mg/dL (ref 0.50–0.99)
GFR, EST AFRICAN AMERICAN: 84 mL/min (ref >=60)
GFR, EST NON AFRICAN AMERICAN: 73 mL/min (ref >=60)
GLUCOSE: 97 mg/dL (ref 65–99)
Potassium: 3.9 mmol/L (ref 3.5–5.3)
Sodium: 142 mmol/L (ref 135–146)

## 2016-12-03 LAB — IRON AND TIBC
%SAT: 14 % (ref 11–50)
Iron: 47 ug/dL (ref 45–160)
TIBC: 346 ug/dL (ref 250–450)
UIBC: 299 ug/dL

## 2016-12-03 LAB — VITAMIN B12: Vitamin B-12: 815 pg/mL (ref 200–1100)

## 2016-12-03 LAB — D-DIMER, QUANTITATIVE (NOT AT ARMC): D DIMER QUANT: 0.31 ug{FEU}/mL (ref <0.50)

## 2016-12-03 LAB — SEDIMENTATION RATE: Sed Rate: 4 mm/hr (ref 0–30)

## 2016-12-03 LAB — MAGNESIUM: Magnesium: 2.1 mg/dL (ref 1.5–2.5)

## 2016-12-03 LAB — C-REACTIVE PROTEIN: CRP: 5.1 mg/L (ref <8.0)

## 2016-12-03 LAB — HEMOGLOBIN A1C
HEMOGLOBIN A1C: 5.6 % (ref <5.7)
MEAN PLASMA GLUCOSE: 114 mg/dL

## 2016-12-03 LAB — URINALYSIS, ROUTINE W REFLEX MICROSCOPIC
Bilirubin Urine: NEGATIVE
GLUCOSE, UA: NEGATIVE
HGB URINE DIPSTICK: NEGATIVE
Ketones, ur: NEGATIVE
Leukocytes, UA: NEGATIVE
Nitrite: NEGATIVE
PH: 6.5 (ref 5.0–8.0)
PROTEIN: NEGATIVE
SPECIFIC GRAVITY, URINE: 1.016 (ref 1.001–1.035)

## 2016-12-03 LAB — VITAMIN D 25 HYDROXY (VIT D DEFICIENCY, FRACTURES): Vit D, 25-Hydroxy: 59 ng/mL (ref 30–100)

## 2016-12-03 LAB — MICROALBUMIN / CREATININE URINE RATIO
Creatinine, Urine: 68 mg/dL (ref 20–320)
Microalb Creat Ratio: 4 mcg/mg creat (ref <30)
Microalb, Ur: 0.3 mg/dL

## 2016-12-03 LAB — FERRITIN: Ferritin: 71 ng/mL (ref 20–288)

## 2016-12-06 ENCOUNTER — Other Ambulatory Visit: Payer: Self-pay | Admitting: Physician Assistant

## 2016-12-06 ENCOUNTER — Ambulatory Visit (HOSPITAL_COMMUNITY)
Admission: RE | Admit: 2016-12-06 | Discharge: 2016-12-06 | Disposition: A | Discharge status: Home / Self Care | Payer: BLUE CROSS/BLUE SHIELD | Source: Ambulatory Visit | Attending: Physician Assistant | Admitting: Physician Assistant

## 2016-12-06 DIAGNOSIS — R0602 Shortness of breath: Secondary | ICD-10-CM | POA: Diagnosis not present

## 2016-12-06 DIAGNOSIS — F17200 Nicotine dependence, unspecified, uncomplicated: Secondary | ICD-10-CM | POA: Diagnosis not present

## 2016-12-06 DIAGNOSIS — G4700 Insomnia, unspecified: Secondary | ICD-10-CM

## 2016-12-06 DIAGNOSIS — I10 Essential (primary) hypertension: Secondary | ICD-10-CM | POA: Diagnosis not present

## 2016-12-06 NOTE — Telephone Encounter (Signed)
Please call Zolpidiem

## 2016-12-07 NOTE — Telephone Encounter (Signed)
Zolpidem was called into pharmacy @ 9:17am on 22nd May 2018 by DD

## 2016-12-22 ENCOUNTER — Other Ambulatory Visit: Payer: Self-pay

## 2016-12-22 MED ORDER — PRAVASTATIN SODIUM 40 MG PO TABS: 40.0000 mg | ORAL_TABLET | Freq: Every day | ORAL | 0 refills | Status: DC

## 2016-12-27 ENCOUNTER — Ambulatory Visit: Payer: BLUE CROSS/BLUE SHIELD

## 2017-01-07 ENCOUNTER — Ambulatory Visit
Admission: RE | Admit: 2017-01-07 | Discharge: 2017-01-07 | Disposition: A | Discharge status: Home / Self Care | Payer: BLUE CROSS/BLUE SHIELD | Source: Ambulatory Visit | Attending: Orthopedic Surgery | Admitting: Orthopedic Surgery

## 2017-01-07 DIAGNOSIS — Z1231 Encounter for screening mammogram for malignant neoplasm of breast: Secondary | ICD-10-CM

## 2017-03-02 ENCOUNTER — Other Ambulatory Visit: Payer: Self-pay | Admitting: Cardiovascular Disease

## 2017-03-28 ENCOUNTER — Other Ambulatory Visit: Payer: Self-pay | Admitting: Physician Assistant

## 2017-03-28 MED ORDER — DIAZEPAM 10 MG PO TABS: 10.0000 mg | ORAL_TABLET | Freq: Every evening | ORAL | 0 refills | Status: DC | PRN

## 2017-03-29 NOTE — Progress Notes (Signed)
VALIUM CALLED INTO PHARMACY ON 11TH SEPT 2018 @ 9:54AM BY DD

## 2017-05-04 ENCOUNTER — Encounter: Payer: Self-pay | Admitting: Cardiology

## 2017-05-04 ENCOUNTER — Ambulatory Visit (INDEPENDENT_AMBULATORY_CARE_PROVIDER_SITE_OTHER): Payer: BLUE CROSS/BLUE SHIELD | Admitting: Cardiology

## 2017-05-04 DIAGNOSIS — R0600 Dyspnea, unspecified: Secondary | ICD-10-CM

## 2017-05-04 DIAGNOSIS — Z0389 Encounter for observation for other suspected diseases and conditions ruled out: Secondary | ICD-10-CM

## 2017-05-04 DIAGNOSIS — I10 Essential (primary) hypertension: Secondary | ICD-10-CM

## 2017-05-04 DIAGNOSIS — R0609 Other forms of dyspnea: Secondary | ICD-10-CM | POA: Diagnosis not present

## 2017-05-04 DIAGNOSIS — IMO0001 Reserved for inherently not codable concepts without codable children: Secondary | ICD-10-CM | POA: Insufficient documentation

## 2017-05-04 NOTE — Assessment & Plan Note (Signed)
Controlled.  

## 2017-05-04 NOTE — Assessment & Plan Note (Signed)
BMI 37  

## 2017-05-04 NOTE — Patient Instructions (Signed)
Medication Instructions:  Your physician recommends that you continue on your current medications as directed. Please refer to the Current Medication list given to you today.   Labwork: None   Testing/Procedures: None   Follow-Up: Your physician wants you to follow-up in: 12 months with Dr Croitoru. You will receive a reminder letter in the mail two months in advance. If you don't receive a letter, please call our office to schedule the follow-up appointment.  Any Other Special Instructions Will Be Listed Below (If Applicable).     If you need a refill on your cardiac medications before your next appointment, please call your pharmacy.  

## 2017-05-04 NOTE — Assessment & Plan Note (Signed)
Cath 2009. Negative Myoview 2013 and Nov 2017. Echo normal Oct 2017

## 2017-05-04 NOTE — Progress Notes (Signed)
05/04/2017 Jade Singh   [birth year]  [id number]  Primary Physician Unk Pinto, MD Primary Cardiologist: Dr Sallyanne Kuster  HPI:  66 y.o.obese female with a history of hypertension, hyperlipidemia, and obesity. She said her mother had vascular disease but she is 52 and has what sounds like a clotting disorder, not CAD. Her brother died at 45 of prostate cancer. She had a cath in 2009 that showed normal coronaries and prior evaluation in 2013 and Nov 2017, both her echocardiogram and her stress test were normal. She is in the office today for a one year follow up. She tells me she has noted gradually increasing DOE for the past few months. No orthopnea, no significant chest pain. She says she does not snore and she does not think she has sleep apnea.   Current Outpatient Prescriptions  Medication Sig Dispense Refill  . aspirin 325 MG tablet Take 325 mg by mouth daily.    . cetirizine (ZYRTEC) 10 MG tablet Take 10 mg by mouth daily.    . Cholecalciferol (VITAMIN D3) 5000 UNITS CAPS Take 5,000 Units by mouth every Monday, Wednesday, and Friday.    . diazepam (VALIUM) 10 MG tablet Take 1 tablet (10 mg total) by mouth at bedtime as needed for anxiety. 90 tablet 0  . fluticasone (FLONASE) 50 MCG/ACT nasal spray Place 1 spray into both nostrils daily. 16 g 2  . pantoprazole (PROTONIX) 40 MG tablet TAKE ONE TABLET BY MOUTH ONCE DAILY 90 tablet 1  . pravastatin (PRAVACHOL) 40 MG tablet Take 1 tablet (40 mg total) by mouth daily with breakfast. 90 tablet 0  . triamterene-hydrochlorothiazide (MAXZIDE-25) 37.5-25 MG tablet TAKE ONE TABLET BY MOUTH ONCE DAILY AT BEDTIME 90 tablet 0  . zolpidem (AMBIEN) 5 MG tablet TAKE 1/2 TO 1 TABLET 1 HOUR BEFORE SLEEP 30 tablet 5   Current Facility-Administered Medications  Medication Dose Route Frequency Provider Last Rate Last Dose  . dexamethasone (DECADRON) injection 10 mg  10 mg Intramuscular Once Forcucci, Courtney, PA-C      . ipratropium-albuterol  (DUONEB) 0.5-2.5 (3) MG/3ML nebulizer solution 3 mL  3 mL Nebulization Once Vicie Mutters, PA-C        Allergies  Allergen Reactions  . Levaquin [Levofloxacin In D5w]     yeast  . Oruvail [Ketoprofen]     GI upset    Past Medical History:  Diagnosis Date  . Anxiety   . Depression   . Difficulty sleeping    takes Ambien  . Diverticulosis 5/1/5  . DJD (degenerative joint disease)   . Family history of anesthesia complication    "mother quit breathing" 30 yrs ago pt does not know any more details. Pt has never had any problems with anesthesia herself  . GERD (gastroesophageal reflux disease)   . Hyperlipidemia   . Hypertension   . Obese   . Peripheral edema   . Psoriasis    HANDS  . Psoriasis   . Unspecified vitamin D deficiency   . Urgency of urination     Social History   Social History  . Marital status: Married    Spouse name: N/A  . Number of children: N/A  . Years of education: N/A   Occupational History  . Not on file.   Social History Main Topics  . Smoking status: Former Smoker    Quit date: 07/19/2000  . Smokeless tobacco: Never Used  . Alcohol use Yes     Comment: occ  . Drug use: No  .  Sexual activity: Not on file   Other Topics Concern  . Not on file   Social History Narrative  . No narrative on file     Family History  Problem Relation Age of Onset  . Stroke Mother   . Hyperlipidemia Mother   . Heart attack Mother   . Cancer Brother 48       prostate, deceased  . Heart disease Paternal Grandfather   . Heart attack Maternal Grandfather   . Diabetes Paternal Uncle   . Colon cancer Neg Hx   . Breast cancer Neg Hx      Review of Systems: General: negative for chills, fever, night sweats or weight changes.  Cardiovascular: negative for chest pain, dyspnea on exertion, edema, orthopnea, palpitations, paroxysmal nocturnal dyspnea or shortness of breath Dermatological: negative for rash Respiratory: negative for cough or  wheezing Urologic: negative for hematuria Abdominal: negative for nausea, vomiting, diarrhea, bright red blood per rectum, melena, or hematemesis Neurologic: negative for visual changes, syncope, or dizziness All other systems reviewed and are otherwise negative except as noted above.    Blood pressure (!) 144/86, pulse 76, height '5\' 5"'  (1.651 m), weight 238 lb (108 kg).  General appearance: alert, cooperative, no distress and moderately obese Lungs: clear to auscultation bilaterally Heart: regular rate and rhythm Extremities: extremities normal, atraumatic, no cyanosis or edema Skin: Skin color, texture, turgor normal. No rashes or lesions Neurologic: Grossly normal  EKG NSR  ASSESSMENT AND PLAN:   Dyspnea on exertion Pt complaints of DOE x 3-6 months  Normal coronary arteries Cath 2009. Negative Myoview 2013 and Nov 2017. Echo normal Oct 2017  Hypertension Controlled  Obese BMI 37   PLAN  I'm not sure what to make of her persistent DOE. Will discuss with Dr Sallyanne Kuster- ? If a MET test would be helpful. (I think these are still done at the hospital).   Kerin Ransom PA-C 05/04/2017 3:04 PM

## 2017-05-04 NOTE — Assessment & Plan Note (Signed)
  Pt complaints of DOE x 3-6 months

## 2017-05-05 ENCOUNTER — Other Ambulatory Visit: Payer: Self-pay | Admitting: Cardiology

## 2017-05-05 DIAGNOSIS — R0609 Other forms of dyspnea: Principal | ICD-10-CM

## 2017-05-05 DIAGNOSIS — R0600 Dyspnea, unspecified: Secondary | ICD-10-CM

## 2017-05-05 NOTE — Progress Notes (Signed)
A CPT (cardiopulmonary stress test) is probably a good idea. MCr

## 2017-05-07 ENCOUNTER — Other Ambulatory Visit: Payer: Self-pay | Admitting: Physician Assistant

## 2017-05-07 ENCOUNTER — Other Ambulatory Visit: Payer: Self-pay | Admitting: Internal Medicine

## 2017-05-07 ENCOUNTER — Other Ambulatory Visit: Payer: Self-pay | Admitting: Cardiovascular Disease

## 2017-05-09 NOTE — Telephone Encounter (Signed)
REFILL 

## 2017-05-10 ENCOUNTER — Other Ambulatory Visit: Payer: Self-pay | Admitting: Cardiology

## 2017-05-10 DIAGNOSIS — R0609 Other forms of dyspnea: Principal | ICD-10-CM

## 2017-05-10 DIAGNOSIS — R0600 Dyspnea, unspecified: Secondary | ICD-10-CM

## 2017-05-17 ENCOUNTER — Ambulatory Visit (INDEPENDENT_AMBULATORY_CARE_PROVIDER_SITE_OTHER): Payer: BLUE CROSS/BLUE SHIELD | Admitting: Physician Assistant

## 2017-05-17 VITALS — BP 140/84 | HR 93 | Temp 97.7°F | Ht 65.0 in | Wt 234.4 lb

## 2017-05-17 DIAGNOSIS — Z79899 Other long term (current) drug therapy: Secondary | ICD-10-CM

## 2017-05-17 DIAGNOSIS — Z6837 Body mass index (BMI) 37.0-37.9, adult: Secondary | ICD-10-CM

## 2017-05-17 DIAGNOSIS — I10 Essential (primary) hypertension: Secondary | ICD-10-CM | POA: Diagnosis not present

## 2017-05-17 DIAGNOSIS — Z23 Encounter for immunization: Secondary | ICD-10-CM | POA: Diagnosis not present

## 2017-05-17 DIAGNOSIS — E7800 Pure hypercholesterolemia, unspecified: Secondary | ICD-10-CM

## 2017-05-17 DIAGNOSIS — R0609 Other forms of dyspnea: Secondary | ICD-10-CM

## 2017-05-17 DIAGNOSIS — R7303 Prediabetes: Secondary | ICD-10-CM

## 2017-05-17 DIAGNOSIS — R059 Cough, unspecified: Secondary | ICD-10-CM

## 2017-05-17 DIAGNOSIS — R05 Cough: Secondary | ICD-10-CM

## 2017-05-17 MED ORDER — HYDROCODONE-HOMATROPINE 5-1.5 MG/5ML PO SYRP: 5.0000 mL | ORAL_SOLUTION | Freq: Four times a day (QID) | ORAL | 0 refills | Status: DC | PRN

## 2017-05-17 MED ORDER — FLUTICASONE FUROATE-VILANTEROL 100-25 MCG/INH IN AEPB: 1.0000 | INHALATION_SPRAY | Freq: Every day | RESPIRATORY_TRACT | 0 refills | Status: DC

## 2017-05-17 NOTE — Patient Instructions (Signed)
Can do samples steroid inhaler if do not tolerate oral steroids, do 1 puff twice a day and wash mouth out afterwards to avoid yeast.    Shortness of Breath, Adult Shortness of breath is when a person has trouble breathing enough air, or when a person feels like she or he is having trouble breathing in enough air. Shortness of breath could be a sign of medical problem. Follow these instructions at home: Pay attention to any changes in your symptoms. Take these actions to help with your condition:  Do not smoke. Smoking is a common cause of shortness of breath. If you smoke and you need help quitting, ask your health care provider.  Avoid things that can irritate your airways, such as: ? Mold. ? Dust. ? Air pollution. ? Chemical fumes. ? Things that can cause allergy symptoms (allergens), if you have allergies.  Keep your living space clean and free of mold and dust.  Rest as needed. Slowly return to your usual activities.  Take over-the-counter and prescription medicines, including oxygen and inhaled medicines, only as told by your health care provider.  Keep all follow-up visits as told by your health care provider. This is important.  Contact a health care provider if:  Your condition does not improve as soon as expected.  You have a hard time doing your normal activities, even after you rest.  You have new symptoms. Get help right away if:  Your shortness of breath gets worse.  You have shortness of breath when you are resting.  You feel light-headed or you faint.  You have a cough that is not controlled with medicines.  You cough up blood.  You have pain with breathing.  You have pain in your chest, arms, shoulders, or abdomen.  You have a fever.  You cannot walk up stairs or exercise the way that you normally do. This information is not intended to replace advice given to you by your health care provider. Make sure you discuss any questions you have with your  health care provider. Document Released: 03/30/2001 Document Revised: 01/24/2016 Document Reviewed: 12/11/2015 Elsevier Interactive Patient Education  [employer]2018 [employer]

## 2017-05-17 NOTE — Progress Notes (Signed)
Follow up  Assessment and Plan:  Essential hypertension - continue medications, DASH diet, exercise and monitor at home. Call if greater than 130/80.  -     CBC with Differential/Platelet -     BASIC METABOLIC PANEL WITH GFR -     Hepatic function panel -     TSH -     Urinalysis, Routine w reflex microscopic -     Microalbumin / creatinine urine ratio -     DG Chest 2 View; Future  Hypercholesterolemia -continue medications, check lipids, decrease fatty foods, increase activity.  -     Lipid panel  Class 2 severe obesity due to excess calories with serious comorbidity and body mass index (BMI) of 37.0 to 37.9 in adult [facility]) - long discussion about weight loss, diet, and exercise  Recurrent major depressive disorder, in full remission (HCC) Depression - continue medications, stress management techniques discussed, increase water, good sleep hygiene discussed, increase exercise, and increase veggies.   Dyspnea on exertion -    Going to get PFTS possible COPD, will start breo once daily - no CP, no accompaniments - if any worsening CP, SOB go to ER  Medication management -     Magnesium   Discussed med's effects and SE's. Screening labs and tests as requested with regular follow-up as recommended. Future Appointments Date Time Provider Department Center  12/05/2017 3:00 PM Quentin Mulling, PA-C [facility] None     HPI  66 y.o. female  presents for 6 month follow up  Her blood pressure has been controlled at home, today their BP is BP: 140/84.  She does not workout. She denies chest pain, shortness of breath, dizziness.    She continue to have SOB with exertion no CP, normal echo and stress test in 04/2016,  She has history of smoking 80 pack year history quit 16 years ago, last CXR 2015,  She is on protonix daily. Going to get pulmonary testing.   She is on cholesterol medication, pravastatin 3 x a week and denies myalgias. Her cholesterol is at goal. The cholesterol last  visit was:  Lab Results  Component Value Date   CHOL 173 12/02/2016   HDL 58 12/02/2016   LDLCALC 95 12/02/2016   TRIG 100 12/02/2016   CHOLHDL 3.0 12/02/2016   She has been working on diet and exercise for prediabetes, she is on bASA, she is on ACE/ARB and denies foot ulcerations, hyperglycemia, hypoglycemia , increased appetite, nausea, paresthesia of the feet, polydipsia, polyuria, visual disturbances, vomiting and weight loss. Last A1C in the office was:  Lab Results  Component Value Date   HGBA1C 5.6 12/02/2016   Patient is on Vitamin D supplement.   Lab Results  Component Value Date   VD25OH 59 12/02/2016   BMI is Body mass index is 39.01 kg/m., she is working on diet and exercise. Wt Readings from Last 3 Encounters:  05/17/17 234 lb 6.4 oz (106.3 kg)  05/04/17 238 lb (108 kg)  12/02/16 236 lb 12.8 oz (107.4 kg)     Current Medications:  Current Outpatient Prescriptions on File Prior to Visit  Medication Sig  . cetirizine (ZYRTEC) 10 MG tablet Take 10 mg by mouth daily.  . Cholecalciferol (VITAMIN D3) 5000 UNITS CAPS Take 5,000 Units by mouth every Monday, Wednesday, and Friday.  . diazepam (VALIUM) 10 MG tablet Take 1 tablet (10 mg total) by mouth at bedtime as needed for anxiety.  . fluticasone (FLONASE) 50 MCG/ACT nasal spray Place 1 spray  into both nostrils daily.  . pantoprazole (PROTONIX) 40 MG tablet TAKE 1 TABLET BY MOUTH ONCE DAILY  . pravastatin (PRAVACHOL) 40 MG tablet TAKE 1 TABLET BY MOUTH ONCE DAILY WITH BREAKFAST (Patient taking differently: take one tablet by mouth three times a week, Tues, Thurs and Sat.)  . triamterene-hydrochlorothiazide (MAXZIDE-25) 37.5-25 MG tablet TAKE 1 TABLET BY MOUTH ONCE DAILY AT BEDTIME  . zolpidem (AMBIEN) 5 MG tablet TAKE 1/2 TO 1 TABLET 1 HOUR BEFORE SLEEP  . aspirin 325 MG tablet Take 325 mg by mouth daily.   Current Facility-Administered Medications on File Prior to Visit  Medication  . dexamethasone (DECADRON)  injection 10 mg  . ipratropium-albuterol (DUONEB) 0.5-2.5 (3) MG/3ML nebulizer solution 3 mL     Medical History:  Past Medical History:  Diagnosis Date  . Anxiety   . Depression   . Difficulty sleeping    takes Ambien  . Diverticulosis 5/1/5  . DJD (degenerative joint disease)   . Family history of anesthesia complication    "mother quit breathing" 30 yrs ago pt does not know any more details. Pt has never had any problems with anesthesia herself  . GERD (gastroesophageal reflux disease)   . Hyperlipidemia   . Hypertension   . Obese   . Peripheral edema   . Psoriasis    HANDS  . Psoriasis   . Unspecified vitamin D deficiency   . Urgency of urination    Allergies Allergies  Allergen Reactions  . Levaquin [Levofloxacin In D5w]     yeast  . Oruvail [Ketoprofen]     GI upset    Surgical History: reviewed and unchanged Family History: reviewed and unchanged Social History: reviewed and unchanged Smoked for 40 years, 2 packs a day.   Review of Systems: Review of Systems  Constitutional: Negative for chills, fever and malaise/fatigue.  HENT: Negative for congestion, ear pain and sore throat.   Eyes: Negative.   Respiratory: Positive for cough, shortness of breath and wheezing. Negative for hemoptysis and sputum production.   Cardiovascular: Negative for chest pain, palpitations and leg swelling.  Gastrointestinal: Negative for abdominal pain, blood in stool, constipation, diarrhea, heartburn and melena.  Genitourinary: Negative.   Skin: Negative for rash.  Neurological: Negative for dizziness, sensory change, loss of consciousness and headaches.  Psychiatric/Behavioral: Negative for depression. The patient is nervous/anxious. The patient does not have insomnia.     Physical Exam: Estimated body mass index is 39.01 kg/m as calculated from the following:   Height as of this encounter: 5\' 5"  (1.651 m).   Weight as of this encounter: 234 lb 6.4 oz (106.3 kg). BP  140/84   Pulse 93   Temp 97.7 F (36.5 C)   Ht 5\' 5"  (1.651 m)   Wt 234 lb 6.4 oz (106.3 kg)   SpO2 97%   BMI 39.01 kg/m   General Appearance: Well nourished well developed, in no apparent distress.  Eyes: PERRLA, EOMs, conjunctiva no swelling or erythema ENT/Mouth: Ear canals normal without obstruction, swelling, erythema, or discharge.  TMs normal bilaterally with no erythema, bulging, retraction, or loss of landmark.  Oropharynx moist and clear with no exudate, erythema, or swelling.   Neck: Supple, thyroid normal. No bruits.  No cervical adenopathy Respiratory: Respiratory effort normal, Breath sounds clear A&P without wheeze, rhonchi, rales.   Cardio: RRR without murmurs, rubs or gallops. Brisk peripheral pulses without edema.  Chest: symmetric, with normal excursions Abdomen: Soft, nontender, no guarding, rebound, hernias, masses, or organomegaly.  Lymphatics:  Non tender without lymphadenopathy.  Musculoskeletal: Full ROM all peripheral extremities,5/5 strength, and normal gait.  Skin: Warm, dry without rashes, lesions, ecchymosis. Neuro: Awake and oriented X 3, Cranial nerves intact, reflexes equal bilaterally. Normal muscle tone, no cerebellar symptoms. Sensation intact.  Psych:  normal affect, Insight and Judgment appropriate.   Quentin Mulling 4:30 PM [facility]

## 2017-05-18 LAB — LIPID PANEL
CHOL/HDL RATIO: 3.1 (calc) (ref <5.0)
CHOLESTEROL: 179 mg/dL (ref <200)
HDL: 57 mg/dL (ref >50)
LDL CHOLESTEROL (CALC): 97 mg/dL
Non-HDL Cholesterol (Calc): 122 mg/dL (calc) (ref <130)
TRIGLYCERIDES: 151 mg/dL — AB (ref <150)

## 2017-05-18 LAB — BASIC METABOLIC PANEL WITH GFR
BUN/Creatinine Ratio: 38 (calc) — ABNORMAL HIGH (ref 6–22)
BUN: 29 mg/dL — AB (ref 7–25)
CO2: 28 mmol/L (ref 20–32)
Calcium: 9.6 mg/dL (ref 8.6–10.4)
Chloride: 105 mmol/L (ref 98–110)
Creat: 0.76 mg/dL (ref 0.50–0.99)
GFR, Est African American: 96 mL/min/{1.73_m2} (ref >=60)
GFR, Est Non African American: 83 mL/min/{1.73_m2} (ref >=60)
Glucose, Bld: 90 mg/dL (ref 65–99)
Potassium: 4.3 mmol/L (ref 3.5–5.3)
Sodium: 142 mmol/L (ref 135–146)

## 2017-05-18 LAB — CBC WITH DIFFERENTIAL/PLATELET
BASOS ABS: 57 {cells}/uL (ref 0–200)
Basophils Relative: 0.7 %
EOS PCT: 2.8 %
Eosinophils Absolute: 230 cells/uL (ref 15–500)
HCT: 41.1 % (ref 35.0–45.0)
Hemoglobin: 13.7 g/dL (ref 11.7–15.5)
LYMPHS ABS: 2755 {cells}/uL (ref 850–3900)
MCH: 28.7 pg (ref 27.0–33.0)
MCHC: 33.3 g/dL (ref 32.0–36.0)
MCV: 86 fL (ref 80.0–100.0)
MONOS PCT: 6.4 %
MPV: 11.2 fL (ref 7.5–12.5)
NEUTROS ABS: 4633 {cells}/uL (ref 1500–7800)
NEUTROS PCT: 56.5 %
Platelets: 223 10*3/uL (ref 140–400)
RBC: 4.78 10*6/uL (ref 3.80–5.10)
RDW: 12.8 % (ref 11.0–15.0)
Total Lymphocyte: 33.6 %
WBC mixed population: 525 cells/uL (ref 200–950)
WBC: 8.2 10*3/uL (ref 3.8–10.8)

## 2017-05-18 LAB — HEPATIC FUNCTION PANEL
AG Ratio: 1.8 (calc) (ref 1.0–2.5)
ALBUMIN MSPROF: 3.9 g/dL (ref 3.6–5.1)
ALT: 21 U/L (ref 6–29)
AST: 14 U/L (ref 10–35)
Alkaline phosphatase (APISO): 61 U/L (ref 33–130)
Bilirubin, Direct: 0.1 mg/dL (ref 0.0–0.2)
GLOBULIN: 2.2 g/dL (ref 1.9–3.7)
Indirect Bilirubin: 0.2 mg/dL (calc) (ref 0.2–1.2)
TOTAL PROTEIN: 6.1 g/dL (ref 6.1–8.1)
Total Bilirubin: 0.3 mg/dL (ref 0.2–1.2)

## 2017-05-18 LAB — TSH: TSH: 1.41 m[IU]/L (ref 0.40–4.50)

## 2017-05-18 LAB — MAGNESIUM: Magnesium: 2 mg/dL (ref 1.5–2.5)

## 2017-05-19 ENCOUNTER — Ambulatory Visit: Payer: Self-pay | Admitting: Physician Assistant

## 2017-06-08 ENCOUNTER — Encounter: Payer: Self-pay | Admitting: Adult Health

## 2017-06-08 ENCOUNTER — Ambulatory Visit (INDEPENDENT_AMBULATORY_CARE_PROVIDER_SITE_OTHER): Payer: BLUE CROSS/BLUE SHIELD | Admitting: Adult Health

## 2017-06-08 ENCOUNTER — Other Ambulatory Visit: Payer: Self-pay | Admitting: Adult Health

## 2017-06-08 VITALS — BP 142/88 | HR 84 | Temp 96.6°F | Wt 237.0 lb

## 2017-06-08 DIAGNOSIS — J069 Acute upper respiratory infection, unspecified: Secondary | ICD-10-CM | POA: Diagnosis not present

## 2017-06-08 DIAGNOSIS — Z23 Encounter for immunization: Secondary | ICD-10-CM | POA: Diagnosis not present

## 2017-06-08 MED ORDER — FLUTICASONE PROPIONATE 50 MCG/ACT NA SUSP: 1.0000 | Freq: Every day | NASAL | 2 refills | Status: DC

## 2017-06-08 NOTE — Progress Notes (Signed)
Assessment and Plan:  Adela LankJacqueline was seen today for uri.  Diagnoses and all orders for this visit:  Viral URI - Discussed the importance of avoiding unnecessary antibiotic therapy. Suggested symptomatic OTC remedies, emphasized hydration and good nutrition Nasal saline spray for congestion. Nasal steroids, allergy pill as needed Follow up as needed. -     fluticasone (FLONASE) 50 MCG/ACT nasal spray; Place 1 spray into both nostrils daily.  Go to the ER if any chest pain, shortness of breath, nausea, dizziness, severe HA, changes vision/speech. Call/send message to office if spikes a high fever not controlled by NSAIDS/tylenol.    Further disposition pending results of labs. Discussed med's effects and SE's.   Over 15 minutes of exam, counseling, chart review, and critical decision making was performed.   Future Appointments  Date Time Provider Department Center  06/13/2017 11:00 AM [facility]  12/05/2017  3:00 PM Quentin Mullingollier, Amanda, PA-C [facility] None    ------------------------------------------------------------------------------------------------------------------   HPI BP (!) 142/88   Pulse 84   Temp (!) 96.6 F (35.9 C)   Wt 237 lb (107.5 kg)   SpO2 96%   BMI 39.44 kg/m   66 y.o.female presents for head cold - congestions, sneezing, mild muscle aches (which have resolved), endorses poor appetite for her ongoing for 1 week - denies n/v/d/abdominal pain. She is able to eat and keep down fluids. She reports symptoms are generally improving but she felt it may be "coming down to my chest" and wishes to be evaluated to rule out pneumonia.   She endorses SOB with exertion no worse than baseline- is set up for pulmonary stress testing next week. Has been cleared by cardiology. She is a former smoker - quit in 2002.   Past Medical History:  Diagnosis Date  . Anxiety   . Depression   . Difficulty sleeping    takes Ambien  . Diverticulosis 5/1/5  . DJD  (degenerative joint disease)   . Family history of anesthesia complication    "mother quit breathing" 30 yrs ago pt does not know any more details. Pt has never had any problems with anesthesia herself  . GERD (gastroesophageal reflux disease)   . Hyperlipidemia   . Hypertension   . Obese   . Peripheral edema   . Psoriasis    HANDS  . Psoriasis   . Unspecified vitamin D deficiency   . Urgency of urination      Allergies  Allergen Reactions  . Levaquin [Levofloxacin In D5w]     yeast  . Oruvail [Ketoprofen]     GI upset    Current Outpatient Medications on File Prior to Visit  Medication Sig  . aspirin 325 MG tablet Take 325 mg by mouth daily.  Marland Kitchen. aspirin 81 MG tablet Take 81 mg by mouth daily.  . cetirizine (ZYRTEC) 10 MG tablet Take 10 mg by mouth daily.  . Cholecalciferol (VITAMIN D3) 5000 UNITS CAPS Take 5,000 Units by mouth every Monday, Wednesday, and Friday.  . diazepam (VALIUM) 10 MG tablet Take 1 tablet (10 mg total) by mouth at bedtime as needed for anxiety.  . fluticasone (FLONASE) 50 MCG/ACT nasal spray Place 1 spray into both nostrils daily.  . fluticasone furoate-vilanterol (BREO ELLIPTA) 100-25 MCG/INH AEPB Inhale 1 puff into the lungs daily. Rinse mouth with water after each use  . HYDROcodone-homatropine (HYCODAN) 5-1.5 MG/5ML syrup Take 5 mLs by mouth every 6 (six) hours as needed for cough.  . MELOXICAM PO Take by mouth daily.  .Marland Kitchen  pantoprazole (PROTONIX) 40 MG tablet TAKE 1 TABLET BY MOUTH ONCE DAILY  . pravastatin (PRAVACHOL) 40 MG tablet TAKE 1 TABLET BY MOUTH ONCE DAILY WITH BREAKFAST (Patient taking differently: take one tablet by mouth three times a week, Tues, Thurs and Sat.)  . triamterene-hydrochlorothiazide (MAXZIDE-25) 37.5-25 MG tablet TAKE 1 TABLET BY MOUTH ONCE DAILY AT BEDTIME  . zolpidem (AMBIEN) 5 MG tablet TAKE 1/2 TO 1 TABLET 1 HOUR BEFORE SLEEP   Current Facility-Administered Medications on File Prior to Visit  Medication  . dexamethasone  (DECADRON) injection 10 mg  . ipratropium-albuterol (DUONEB) 0.5-2.5 (3) MG/3ML nebulizer solution 3 mL    ROS: Review of Systems  Constitutional: Positive for malaise/fatigue. Negative for chills, diaphoresis and fever.  HENT: Positive for congestion and sore throat (mild). Negative for ear discharge, ear pain, hearing loss, sinus pain and tinnitus.   Eyes: Negative for blurred vision, pain, discharge and redness.  Respiratory: Positive for cough. Negative for hemoptysis, sputum production, shortness of breath, wheezing and stridor.   Cardiovascular: Negative for chest pain, palpitations and orthopnea.  Gastrointestinal: Negative for abdominal pain, diarrhea, nausea and vomiting.  Genitourinary: Negative.   Musculoskeletal: Negative for joint pain and myalgias.  Skin: Negative for rash.  Neurological: Negative for dizziness, sensory change, weakness and headaches.  Endo/Heme/Allergies: Negative for environmental allergies.  Psychiatric/Behavioral: Negative.   All other systems reviewed and are negative.    Physical Exam:  BP (!) 142/88   Pulse 84   Temp (!) 96.6 F (35.9 C)   Wt 237 lb (107.5 kg)   SpO2 96%   BMI 39.44 kg/m   General Appearance: Well nourished, in no apparent distress. Eyes: PERRLA, EOMs, conjunctiva no swelling or erythema Sinuses: No Frontal/maxillary tenderness ENT/Mouth: Ext aud canals clear, TMs without erythema, bulging. No erythema, swelling, or exudate on post pharynx.  Scant clear discharge in nares- not significantly injected or boggy. Tonsils not swollen or erythematous. Hearing normal.  Neck: Supple, thyroid normal.  Respiratory: Respiratory effort normal, BS equal bilaterally without rales, rhonchi, wheezing or stridor. Mildly coarse sounds through bilateral bases.  Cardio: RRR with no MRGs. Brisk peripheral pulses without edema.  Abdomen: Soft, + BS.  Non tender, no guarding, rebound, hernias, masses. Lymphatics: Non tender without  lymphadenopathy.  Musculoskeletal: Full ROM, 5/5 strength, normal gait.  Skin: Warm, dry without rashes, lesions, ecchymosis.  Neuro: Cranial nerves intact. Normal muscle tone, no cerebellar symptoms. Sensation intact.  Psych: Awake and oriented X 3, normal affect, Insight and Judgment appropriate.     Dan MakerAshley C Erasmo Vertz, NP 10:07 AM [facility]

## 2017-06-08 NOTE — Patient Instructions (Signed)

## 2017-06-08 NOTE — Addendum Note (Signed)
Addended by: Dionicio StallUCKER, Chimere Klingensmith on: 06/08/2017 10:54 AM   Modules accepted: Orders

## 2017-06-10 ENCOUNTER — Other Ambulatory Visit: Payer: Self-pay | Admitting: Internal Medicine

## 2017-06-10 DIAGNOSIS — G4700 Insomnia, unspecified: Secondary | ICD-10-CM

## 2017-06-13 ENCOUNTER — Ambulatory Visit (HOSPITAL_COMMUNITY): Payer: BLUE CROSS/BLUE SHIELD | Attending: Cardiology

## 2017-06-13 DIAGNOSIS — R0609 Other forms of dyspnea: Secondary | ICD-10-CM | POA: Insufficient documentation

## 2017-06-13 NOTE — Telephone Encounter (Signed)
Remus LofflerMBIEN has been called into pharmacy on Nov 26th 2018 By DD

## 2017-10-12 ENCOUNTER — Other Ambulatory Visit: Payer: Self-pay | Admitting: Adult Health

## 2017-10-12 DIAGNOSIS — J069 Acute upper respiratory infection, unspecified: Secondary | ICD-10-CM

## 2017-11-06 ENCOUNTER — Other Ambulatory Visit: Payer: Self-pay | Admitting: Adult Health

## 2017-11-06 DIAGNOSIS — J069 Acute upper respiratory infection, unspecified: Secondary | ICD-10-CM

## 2017-12-05 ENCOUNTER — Encounter: Payer: Self-pay | Admitting: Physician Assistant

## 2017-12-05 ENCOUNTER — Encounter: Payer: Self-pay | Admitting: Adult Health

## 2017-12-05 NOTE — Progress Notes (Deleted)
Complete Physical  Assessment and Plan:  Diagnoses and all orders for this visit:  Encounter for routine adult health examination with abnormal findings EKG AAA screening US  [place] hypertension Continue medications Monitor blood pressure at home; call if consistently over 130/80 Continue DASH diet.   Reminder to go to the ER if any CP, SOB, nausea, dizziness, severe HA, changes vision/speech, left arm numbness and tingling and jaw pain. Followed by cardiology as well  Irritable bowel syndrome, unspecified type Avoid triggers, add soluble fiber if not taking regularly  Gastroesophageal reflux disease, esophagitis presence not specified Well managed on current medications Discussed diet, avoiding triggers and other lifestyle changes  Diverticulosis Increase soluble fiber, UTD on colonoscopies  Osteoarthritis of right knee, unspecified osteoarthritis type Established with Dr. Jillyn Hidden, will have follow up for ongoing stiffness  Vitamin D deficiency Continue supplementation Check vitamin D level  Prediabetes Discussed disease and risks Discussed diet/exercise, weight management  A1C  Hypercholesterolemia Continue medications Continue low cholesterol diet and exercise.  Check lipid panel.   Recurrent major depressive disorder, in full remission (HCC) Continue medications  Lifestyle discussed: diet/exerise, sleep hygiene, stress management, hydration  Anxiety state Well managed by current regimen; continue medications; reminded to avoid daily benzo use Stress management techniques discussed, increase water, good sleep hygiene discussed, increase exercise, and increase veggies.   Anemia, unspecified type CBC  Medication management CBC, CMP/GFR    Discussed med's effects and SE's. Screening labs and tests as requested with regular follow-up as recommended. Over 40 minutes of exam, counseling, chart review, and complex, high level critical decision making was  performed this visit.   Future Appointments  Date Time Provider Department Center  12/12/2018  2:00 PM Judd Gaudier, NP [facility] None     HPI  67 y.o. female  presents for a complete physical and follow up for has Anxiety state; Depression; IBS; Vitamin D deficiency; Hypertension; Hypercholesterolemia; GERD (gastroesophageal reflux disease); Morbid obesity (HCC); Right knee DJD; Prediabetes; Diverticulosis; Dyspnea on exertion; and Normal coronary arteries on their problem list.   Current Medications:  Current Outpatient Medications on File Prior to Visit  Medication Sig Dispense Refill  . aspirin 81 MG tablet Take 81 mg by mouth daily.    . cetirizine (ZYRTEC) 10 MG tablet Take 10 mg by mouth daily.    . Cholecalciferol (VITAMIN D3) 5000 UNITS CAPS Take 5,000 Units by mouth every Monday, Wednesday, and Friday.    . diazepam (VALIUM) 10 MG tablet Take 1 tablet (10 mg total) by mouth at bedtime as needed for anxiety. 90 tablet 0  . fluticasone (FLONASE) 50 MCG/ACT nasal spray SPRAY 1 SPRAY INTO EACH NOSTRIL EVERY DAY 48 g 3  . HYDROcodone-homatropine (HYCODAN) 5-1.5 MG/5ML syrup Take 5 mLs by mouth every 6 (six) hours as needed for cough. 120 mL 0  . triamterene-hydrochlorothiazide (MAXZIDE-25) 37.5-25 MG tablet TAKE 1 TABLET BY MOUTH ONCE DAILY AT BEDTIME 90 tablet 3  . zolpidem (AMBIEN) 5 MG tablet TAKE ONE-HALF TO ONE TABLET BY MOUTH ONE HOUR BEFORE SLEEP 30 tablet 5  . aspirin 325 MG tablet Take 325 mg by mouth daily.    . fluticasone furoate-vilanterol (BREO ELLIPTA) 100-25 MCG/INH AEPB Inhale 1 puff into the lungs daily. Rinse mouth with water after each use (Patient not taking: Reported on 12/06/2017) 1 each 0  . MELOXICAM PO Take by mouth daily.     Current Facility-Administered Medications on File Prior to Visit  Medication Dose Route Frequency Provider Last Rate Last Dose  .  dexamethasone (DECADRON) injection 10 mg  10 mg Intramuscular Once Forcucci, Courtney, PA-C      .  ipratropium-albuterol (DUONEB) 0.5-2.5 (3) MG/3ML nebulizer solution 3 mL  3 mL Nebulization Once Quentin Mulling, PA-C       Allergies:  Allergies  Allergen Reactions  . Levaquin [Levofloxacin In D5w]     yeast  . Oruvail [Ketoprofen]     GI upset   Medical History:  She has Anxiety state; Depression; IBS; Vitamin D deficiency; Hypertension; Hypercholesterolemia; GERD (gastroesophageal reflux disease); Morbid obesity (HCC); Right knee DJD; Prediabetes; Diverticulosis; Dyspnea on exertion; and Normal coronary arteries on their problem list. Health Maintenance:   Immunization History  Administered Date(s) Administered  . Influenza Inj Mdck Quad With Preservative 05/17/2017  . Influenza Split 06/25/2014, 05/08/2015  . Influenza,inj,quad, With Preservative 04/13/2016  . Influenza-Unspecified 05/28/2013  . PPD Test 08/29/2013  . Pneumococcal Conjugate-13 06/08/2017  . Pneumococcal Polysaccharide-23 06/07/2013  . Tdap 05/19/2009  . Zoster 06/25/2014    Tetanus: 2010 Pneumovax: 2014 Prevnar 13: 2018  Flu vaccine: 2018 Zostavax: 2015  Pap: S/p hysterectomy MGM: 12/2016 DEXA: 2013 Colonoscopy: 2015 EGD:  Last Dental Exam: Dr. Melynda Ripple, *** Last Eye Exam: [facility], ***  Patient Care Team: [name], MD as PCP - General (Internal Medicine) Thurmon Fair, MD as Consulting Physician (Cardiology) Jene Every, MD as Consulting Physician (Orthopedic Surgery)  Surgical History:  She has a past surgical history that includes Knee arthroscopy; Appendectomy; Tubal ligation; Abdominal hysterectomy; Tonsillectomy and adenoidectomy; Cardiac catheterization (2005); and Total knee arthroplasty (Right, 02/28/2014). Family History:  Herfamily history includes Cancer (age of onset: 68) in her brother; Diabetes in her paternal uncle; Heart attack in her maternal grandfather and mother; Heart disease in her paternal grandfather; Hyperlipidemia in her mother; Stroke in her  mother. Social History:  She reports that she quit smoking about 17 years ago. She has never used smokeless tobacco. She reports that she drinks alcohol. She reports that she does not use drugs.  Review of Systems: Review of Systems  Constitutional: Negative for malaise/fatigue and weight loss.  HENT: Negative for hearing loss and tinnitus.   Eyes: Negative for blurred vision and double vision.  Respiratory: Negative for cough, sputum production, shortness of breath and wheezing.   Cardiovascular: Negative for chest pain, palpitations, orthopnea, claudication, leg swelling and PND.  Gastrointestinal: Negative for abdominal pain, blood in stool, constipation, diarrhea, heartburn, melena, nausea and vomiting.  Genitourinary: Negative.   Musculoskeletal: Negative for falls, joint pain and myalgias.  Skin: Negative for rash.  Neurological: Negative for dizziness, tingling, sensory change, weakness and headaches.  Endo/Heme/Allergies: Negative for polydipsia.  Psychiatric/Behavioral: Negative.  Negative for depression, memory loss, substance abuse and suicidal ideas. The patient is not nervous/anxious and does not have insomnia.   All other systems reviewed and are negative.   Physical Exam: Estimated body mass index is 39.94 kg/m as calculated from the following:   Height as of this encounter:  (1.651 m).   Weight as of this encounter: 240 lb (108.9 kg). BP 136/88   Pulse 94   Temp (!) 97.5 F (36.4 C)   Ht  (1.651 m)   Wt 240 lb (108.9 kg)   SpO2 96%   BMI 39.94 kg/m  General Appearance: Well nourished, in no apparent distress.  Eyes: PERRLA, EOMs, conjunctiva no swelling or erythema, normal fundi and vessels.  Sinuses: No Frontal/maxillary tenderness  ENT/Mouth: Ext aud canals clear, normal light reflex with TMs without erythema, bulging.  Good dentition. No erythema, swelling, or exudate on post pharynx. Tonsils not swollen or erythematous. Hearing normal.  Neck:  Supple, thyroid normal. No bruits  Respiratory: Respiratory effort normal, BS equal bilaterally without rales, rhonchi, wheezing or stridor.  Cardio: RRR without murmurs, rubs or gallops. Brisk peripheral pulses without edema.  Chest: symmetric, with normal excursions and percussion.  Breasts: Symmetric, without lumps, nipple discharge, retractions.  Abdomen: Soft, nontender, no guarding, rebound, hernias, masses, or organomegaly.  Lymphatics: Non tender without lymphadenopathy.  Genitourinary:  Musculoskeletal: Full ROM all peripheral extremities,5/5 strength, and normal gait.  Skin: Warm, dry without rashes, lesions, ecchymosis. Neuro: Cranial nerves intact, reflexes equal bilaterally. Normal muscle tone, no cerebellar symptoms. Sensation intact.  Psych: Awake and oriented X 3, normal affect, Insight and Judgment appropriate.   EKG: WNL no ST changes.  Carlyon Shadow Avleen Bordwell 2:29 PM [facility]

## 2017-12-06 ENCOUNTER — Ambulatory Visit (INDEPENDENT_AMBULATORY_CARE_PROVIDER_SITE_OTHER): Payer: Medicare Other | Admitting: Adult Health

## 2017-12-06 ENCOUNTER — Encounter: Payer: Self-pay | Admitting: Adult Health

## 2017-12-06 VITALS — BP 136/88 | HR 94 | Temp 97.5°F | Ht 65.0 in | Wt 240.0 lb

## 2017-12-06 DIAGNOSIS — R7303 Prediabetes: Secondary | ICD-10-CM

## 2017-12-06 DIAGNOSIS — E7800 Pure hypercholesterolemia, unspecified: Secondary | ICD-10-CM | POA: Diagnosis not present

## 2017-12-06 DIAGNOSIS — E559 Vitamin D deficiency, unspecified: Secondary | ICD-10-CM | POA: Diagnosis not present

## 2017-12-06 DIAGNOSIS — R6889 Other general symptoms and signs: Secondary | ICD-10-CM | POA: Diagnosis not present

## 2017-12-06 DIAGNOSIS — I10 Essential (primary) hypertension: Secondary | ICD-10-CM | POA: Diagnosis not present

## 2017-12-06 DIAGNOSIS — D649 Anemia, unspecified: Secondary | ICD-10-CM

## 2017-12-06 DIAGNOSIS — Z136 Encounter for screening for cardiovascular disorders: Secondary | ICD-10-CM

## 2017-12-06 DIAGNOSIS — M1711 Unilateral primary osteoarthritis, right knee: Secondary | ICD-10-CM

## 2017-12-06 DIAGNOSIS — Z79899 Other long term (current) drug therapy: Secondary | ICD-10-CM

## 2017-12-06 DIAGNOSIS — Z0001 Encounter for general adult medical examination with abnormal findings: Secondary | ICD-10-CM

## 2017-12-06 DIAGNOSIS — K5790 Diverticulosis of intestine, part unspecified, without perforation or abscess without bleeding: Secondary | ICD-10-CM

## 2017-12-06 DIAGNOSIS — Z0000 Encounter for general adult medical examination without abnormal findings: Secondary | ICD-10-CM

## 2017-12-06 DIAGNOSIS — K589 Irritable bowel syndrome without diarrhea: Secondary | ICD-10-CM

## 2017-12-06 DIAGNOSIS — K219 Gastro-esophageal reflux disease without esophagitis: Secondary | ICD-10-CM | POA: Diagnosis not present

## 2017-12-06 DIAGNOSIS — Z87891 Personal history of nicotine dependence: Secondary | ICD-10-CM

## 2017-12-06 DIAGNOSIS — F411 Generalized anxiety disorder: Secondary | ICD-10-CM

## 2017-12-06 DIAGNOSIS — F3342 Major depressive disorder, recurrent, in full remission: Secondary | ICD-10-CM

## 2017-12-06 MED ORDER — PRAVASTATIN SODIUM 40 MG PO TABS: ORAL_TABLET | ORAL | 1 refills | Status: DC

## 2017-12-06 MED ORDER — PANTOPRAZOLE SODIUM 40 MG PO TBEC: 40.0000 mg | DELAYED_RELEASE_TABLET | Freq: Every day | ORAL | 1 refills | Status: DC

## 2017-12-06 NOTE — Patient Instructions (Signed)
Aim for 7+ servings of fruits and vegetables daily  80+ fluid ounces of water or unsweet tea for healthy kidneys  Limit alcohol intake  Limit animal fats in diet for cholesterol and heart health - choose grass fed whenever available  Aim for low stress - take time to unwind and care for your mental health  Aim for 150 min of moderate intensity exercise weekly for heart health, and weights twice weekly for bone health  Aim for 7-9 hours of sleep daily      When it comes to diets, agreement about the perfect plan isn't easy to find, even among the experts. Experts at the [employer] developed an idea known as the Healthy Eating Plate. Just imagine a plate divided into logical, healthy portions.  The emphasis is on diet quality:  Load up on vegetables and fruits - one-half of your plate: Aim for color and variety, and remember that potatoes don't count.  Go for whole grains - one-quarter of your plate: Whole wheat, barley, wheat berries, quinoa, oats, brown rice, and foods made with them. If you want pasta, go with whole wheat pasta.  Protein power - one-quarter of your plate: Fish, chicken, beans, and nuts are all healthy, versatile protein sources. Limit red meat.  The diet, however, does go beyond the plate, offering a few other suggestions.  Use healthy plant oils, such as olive, canola, soy, corn, sunflower and peanut. Check the labels, and avoid partially hydrogenated oil, which have unhealthy trans fats.  If you're thirsty, drink water. Coffee and tea are good in moderation, but skip sugary drinks and limit milk and dairy products to one or two daily servings.  The type of carbohydrate in the diet is more important than the amount. Some sources of carbohydrates, such as vegetables, fruits, whole grains, and beans-are healthier than others.  Finally, stay active.  

## 2017-12-06 NOTE — Progress Notes (Signed)
MEDICARE ANNUAL WELLNESS VISIT AND FOLLOW UP  Assessment:   Welcome to medicare visit EKG AAA screening US  Essential hypertension Continue medications Monitor blood pressure at home; call if consistently over 130/80 Continue DASH diet.   Reminder to go to the ER if any CP, SOB, nausea, dizziness, severe HA, changes vision/speech, left arm numbness and tingling and jaw pain. Followed by cardiology as well  Irritable bowel syndrome, unspecified type Avoid triggers, add soluble fiber if not taking regularly  Gastroesophageal reflux disease, esophagitis presence not specified Well managed on current medications Discussed diet, avoiding triggers and other lifestyle changes  Diverticulosis Increase soluble fiber, UTD on colonoscopies  Osteoarthritis of right knee, unspecified osteoarthritis type Established with Dr. Jillyn Hidden, will have follow up for ongoing stiffness post TKA - PT vs injections vs closed manip, discuss ongoing plan of care  Vitamin D deficiency Continue supplementation Check vitamin D level  Prediabetes Discussed disease and risks Discussed diet/exercise, weight management  A1C  Hypercholesterolemia Continue medications Continue low cholesterol diet and exercise.  Check lipid panel.   Recurrent major depressive disorder, in full remission (HCC) Continue medications  Lifestyle discussed: diet/exerise, sleep hygiene, stress management, hydration  Anxiety state Well managed by current regimen;  uses benzo rarely Stress management techniques discussed, increase water, good sleep hygiene discussed, increase exercise, and increase veggies.   Anemia, unspecified type CBC  Medication management CBC, CMP/GFR, UA, magnesium  Morbid obesity Long discussion about weight loss, diet, and exercise Recommended diet heavy in fruits and veggies and low in animal meats, cheeses, and dairy products, appropriate calorie intake Patient will work on restarting weight  watchers Discussed appropriate weight for height  Follow up at next visit   Over 40 minutes of exam, counseling, chart review and critical decision making was performed Future Appointments  Date Time Provider Department Center  06/08/2018 10:00 AM [name], MD [facility] None     Plan:   During the course of the visit the patient was educated and counseled about appropriate screening and preventive services including:    Pneumococcal vaccine   Prevnar 13  Influenza vaccine  Td vaccine  Screening electrocardiogram  Bone densitometry screening  Colorectal cancer screening  Diabetes screening  Glaucoma screening  Nutrition counseling   Advanced directives: requested   Subjective:  Jade Singh is a 67 y.o. female who presents for Medicare Annual Wellness Visit and 3 month follow up. She is followed by cardiology annually, and by Dr. Jillyn Hidden for right knee arthritis s/p TKA but with ongoing stiffness/pain significantly limiting her activity levels.   she has a diagnosis of anxiety and is currently on valium 10 mg PRN, reports symptoms are well controlled on current regimen. she takes very rarely, last was over 5 months ago. She is prescribed ambien 5 mg daily for sleep and reports she has done very well with this without SE.   BMI is Body mass index is 39.94 kg/m., she has been working on diet and exercise. She is feeling a bit down/depressed regarding her weight; discussed extensively, and she has previously successfully lost 50+ lb on weight watchers; she agrees she felt well on this program and not excessively restricted. She is agreeable to restarting this program. Discussed likely need to stay on some sort of weight management program long term/lifestyle modification for weight maintenance to avoid repeated weight gain/loss.  Wt Readings from Last 3 Encounters:  12/06/17 240 lb (108.9 kg)  06/08/17 237 lb (107.5 kg)  05/17/17 234 lb 6.4 oz (  106.3 kg)    Her blood pressure has been controlled at home, today their BP is BP: 136/88 She does not workout. She denies chest pain, shortness of breath, dizziness.   She is on cholesterol medication (pravachol 40 mg three times a week) and denies myalgias. Her cholesterol is at goal. The cholesterol last visit was:   Lab Results  Component Value Date   CHOL 179 05/17/2017   HDL 57 05/17/2017   LDLCALC 97 05/17/2017   TRIG 151 (H) 05/17/2017   CHOLHDL 3.1 05/17/2017   She has been working on diet and exercise for glucose managment, and denies foot ulcerations, increased appetite, nausea, paresthesia of the feet, polydipsia, polyuria, visual disturbances, vomiting and weight loss. Last A1C in the office was:  Lab Results  Component Value Date   HGBA1C 5.6 12/02/2016   Last GFR: Lab Results  Component Value Date   GFRNONAA 83 05/17/2017   Patient is on Vitamin D supplement and approaching goal of 70 at last check:   Lab Results  Component Value Date   VD25OH 59 12/02/2016       Medication Review: Current Outpatient Medications on File Prior to Visit  Medication Sig Dispense Refill  . aspirin 81 MG tablet Take 81 mg by mouth daily.    . cetirizine (ZYRTEC) 10 MG tablet Take 10 mg by mouth daily.    . Cholecalciferol (VITAMIN D3) 5000 UNITS CAPS Take 5,000 Units by mouth every Monday, Wednesday, and Friday.    . diazepam (VALIUM) 10 MG tablet Take 1 tablet (10 mg total) by mouth at bedtime as needed for anxiety. 90 tablet 0  . fluticasone (FLONASE) 50 MCG/ACT nasal spray SPRAY 1 SPRAY INTO EACH NOSTRIL EVERY DAY 48 g 3  . HYDROcodone-homatropine (HYCODAN) 5-1.5 MG/5ML syrup Take 5 mLs by mouth every 6 (six) hours as needed for cough. 120 mL 0  . triamterene-hydrochlorothiazide (MAXZIDE-25) 37.5-25 MG tablet TAKE 1 TABLET BY MOUTH ONCE DAILY AT BEDTIME 90 tablet 3  . zolpidem (AMBIEN) 5 MG tablet TAKE ONE-HALF TO ONE TABLET BY MOUTH ONE HOUR BEFORE SLEEP 30 tablet 5  . aspirin 325 MG  tablet Take 325 mg by mouth daily.    . fluticasone furoate-vilanterol (BREO ELLIPTA) 100-25 MCG/INH AEPB Inhale 1 puff into the lungs daily. Rinse mouth with water after each use (Patient not taking: Reported on 12/06/2017) 1 each 0  . MELOXICAM PO Take by mouth daily.     Current Facility-Administered Medications on File Prior to Visit  Medication Dose Route Frequency Provider Last Rate Last Dose  . dexamethasone (DECADRON) injection 10 mg  10 mg Intramuscular Once Forcucci, Courtney, PA-C      . ipratropium-albuterol (DUONEB) 0.5-2.5 (3) MG/3ML nebulizer solution 3 mL  3 mL Nebulization Once Quentin Mulling, PA-C        Allergies  Allergen Reactions  . Levaquin [Levofloxacin In D5w]     yeast  . Oruvail [Ketoprofen]     GI upset    Current Problems (verified) Patient Active Problem List   Diagnosis Date Noted  . Dyspnea on exertion 05/04/2017  . Normal coronary arteries 05/04/2017  . Diverticulosis   . Prediabetes 06/25/2014  . 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 Vitamin D deficiency   . Hypertension   . Hypercholesterolemia   . GERD (gastroesophageal reflux disease)   . Morbid obesity (HCC)   . Anxiety state 11/06/2007  . Depression 11/06/2007  . IBS 11/06/2007    Screening Tests Immunization History  Administered Date(s)  Administered  . Influenza Inj Mdck Quad With Preservative 05/17/2017  . Influenza Split 06/25/2014, 05/08/2015  . Influenza,inj,quad, With Preservative 04/13/2016  . Influenza-Unspecified 05/28/2013  . PPD Test 08/29/2013  . Pneumococcal Conjugate-13 06/08/2017  . Pneumococcal Polysaccharide-23 06/07/2013  . Tdap 05/19/2009  . Zoster 06/25/2014   Tetanus: 2010 Pneumovax: 2014 Prevnar 13: 2018  Flu vaccine: 2018 Zostavax: 2015  Pap: S/p hysterectomy MGM: 12/2016 DEXA: 2013 Colonoscopy: 2015 EGD: -   Last Dental Exam: Dr. -, 2019 q1months Last Eye Exam: [facility], 2018  Patient Care Team: [name], MD as PCP - General (Internal  Medicine) Thurmon Fair, MD as Consulting Physician (Cardiology) Jene Every, MD as Consulting Physician (Orthopedic Surgery)  SURGICAL HISTORY She  has a past surgical history that includes Knee arthroscopy; Appendectomy; Tubal ligation; Abdominal hysterectomy; Tonsillectomy and adenoidectomy; Cardiac catheterization (2005); and Total knee arthroplasty (Right, 02/28/2014). FAMILY HISTORY Her family history includes Cancer (age of onset: 67) in her brother; Diabetes in her paternal uncle; Heart attack in her maternal grandfather and mother; Heart disease in her paternal grandfather; Hyperlipidemia in her mother; Stroke in her mother. SOCIAL HISTORY She  reports that she quit smoking about 17 years ago. She has never used smokeless tobacco. She reports that she drinks alcohol. She reports that she does not use drugs.   MEDICARE WELLNESS OBJECTIVES: Physical activity: Current Exercise Habits: The patient does not participate in regular exercise at present, Exercise limited by: orthopedic condition(s) Cardiac risk factors: Cardiac Risk Factors include: advanced age (>70men, >80 women);dyslipidemia;hypertension;obesity (BMI >30kg/m2);sedentary lifestyle;smoking/ tobacco exposure Depression/mood screen:   Depression screen [facility] 2/9 12/06/2017  Decreased Interest 1  Down, Depressed, Hopeless 1  PHQ - 2 Score 2  Altered sleeping 0  Tired, decreased energy 1  Change in appetite 1  Feeling bad or failure about yourself  3  Trouble concentrating 0  Moving slowly or fidgety/restless 1  Suicidal thoughts 0  PHQ-9 Score 8  Difficult doing work/chores Not difficult at all    ADLs:  In your present state of health, do you have any difficulty performing the following activities: 12/06/2017  Hearing? N  Vision? N  Difficulty concentrating or making decisions? N  Walking or climbing stairs? N  Dressing or bathing? N  Doing errands, shopping? N  Some recent data might be hidden     Cognitive  Testing  Alert? Yes  Normal Appearance?Yes  Oriented to person? Yes  Place? Yes   Time? Yes  Recall of three objects?  Yes  Can perform simple calculations? Yes  Displays appropriate judgment?Yes  Can read the correct time from a watch face?Yes  EOL planning: Does Patient Have a Medical Advance Directive?: No Would patient like information on creating a medical advance directive?: No - Patient declined  Review of Systems  Constitutional: Negative for malaise/fatigue and weight loss.  HENT: Negative for hearing loss and tinnitus.   Eyes: Negative for blurred vision and double vision.  Respiratory: Negative for cough, sputum production, shortness of breath and wheezing.   Cardiovascular: Negative for chest pain, palpitations, orthopnea, claudication, leg swelling and PND.  Gastrointestinal: Negative for abdominal pain, blood in stool, constipation, diarrhea, heartburn, melena, nausea and vomiting.  Genitourinary: Negative.   Musculoskeletal: Negative for falls, joint pain and myalgias.  Skin: Negative for rash.  Neurological: Negative for dizziness, tingling, sensory change, weakness and headaches.  Endo/Heme/Allergies: Negative for polydipsia.  Psychiatric/Behavioral: Negative for depression, memory loss, substance abuse and suicidal ideas. The patient is not nervous/anxious and does  not have insomnia (Controlled on ambien).   All other systems reviewed and are negative.    Objective:     Today's Vitals   12/06/17 1400  BP: 136/88  Pulse: 94  Temp: (!) 97.5 F (36.4 C)  SpO2: 96%  Weight: 240 lb (108.9 kg)  Height:  (1.651 m)   Body mass index is 39.94 kg/m.  General appearance: alert, no distress, WD/WN, female HEENT: normocephalic, sclerae anicteric, TMs pearly, nares patent, no discharge or erythema, pharynx normal Oral cavity: MMM, no lesions Neck: supple, no lymphadenopathy, no thyromegaly, no masses Heart: RRR, normal S1, S2, no murmurs Lungs: CTA  bilaterally, no wheezes, rhonchi, or rales Abdomen: +bs, soft, non tender, non distended, no masses, no hepatomegaly, no splenomegaly Musculoskeletal: nontender, no swelling, no obvious deformity, ROM of R knee limited in flexion Extremities: no edema, no cyanosis, no clubbing Pulses: 2+ symmetric, upper and lower extremities, normal cap refill Neurological: alert, oriented x 3, CN2-12 intact, strength normal upper extremities and lower extremities, sensation normal throughout, DTRs 2+ throughout, no cerebellar signs, gait normal Psychiatric: normal affect, behavior normal, pleasant   Medicare Attestation I have personally reviewed: The patient's medical and social history Their use of alcohol, tobacco or illicit drugs Their current medications and supplements The patient's functional ability including ADLs,fall risks, home safety risks, cognitive, and hearing and visual impairment Diet and physical activities Evidence for depression or mood disorders  The patient's weight, height, BMI, and visual acuity have been recorded in the chart.  I have made referrals, counseling, and provided education to the patient based on review of the above and I have provided the patient with a written personalized care plan for preventive services.     Dan Maker, NP   12/06/2017

## 2017-12-07 LAB — CBC WITH DIFFERENTIAL/PLATELET
BASOS PCT: 0.7 %
Basophils Absolute: 59 cells/uL (ref 0–200)
EOS PCT: 2.4 %
Eosinophils Absolute: 202 cells/uL (ref 15–500)
HCT: 41.2 % (ref 35.0–45.0)
Hemoglobin: 13.9 g/dL (ref 11.7–15.5)
Lymphs Abs: 2512 cells/uL (ref 850–3900)
MCH: 28.7 pg (ref 27.0–33.0)
MCHC: 33.7 g/dL (ref 32.0–36.0)
MCV: 85.1 fL (ref 80.0–100.0)
MONOS PCT: 6.1 %
MPV: 11.3 fL (ref 7.5–12.5)
NEUTROS ABS: 5116 {cells}/uL (ref 1500–7800)
Neutrophils Relative %: 60.9 %
PLATELETS: 249 10*3/uL (ref 140–400)
RBC: 4.84 10*6/uL (ref 3.80–5.10)
RDW: 13 % (ref 11.0–15.0)
TOTAL LYMPHOCYTE: 29.9 %
WBC mixed population: 512 cells/uL (ref 200–950)
WBC: 8.4 10*3/uL (ref 3.8–10.8)

## 2017-12-07 LAB — HEMOGLOBIN A1C
EAG (MMOL/L): 6.5 (calc)
HEMOGLOBIN A1C: 5.7 %{Hb} — AB (ref <5.7)
Mean Plasma Glucose: 117 (calc)

## 2017-12-07 LAB — COMPLETE METABOLIC PANEL WITH GFR
AG RATIO: 1.9 (calc) (ref 1.0–2.5)
ALKALINE PHOSPHATASE (APISO): 63 U/L (ref 33–130)
ALT: 18 U/L (ref 6–29)
AST: 15 U/L (ref 10–35)
Albumin: 4.1 g/dL (ref 3.6–5.1)
BUN: 24 mg/dL (ref 7–25)
CO2: 28 mmol/L (ref 20–32)
Calcium: 9.7 mg/dL (ref 8.6–10.4)
Chloride: 106 mmol/L (ref 98–110)
Creat: 0.86 mg/dL (ref 0.50–0.99)
GFR, Est African American: 82 mL/min/{1.73_m2} (ref >=60)
GFR, Est Non African American: 71 mL/min/{1.73_m2} (ref >=60)
GLOBULIN: 2.2 g/dL (ref 1.9–3.7)
Glucose, Bld: 95 mg/dL (ref 65–99)
POTASSIUM: 4.2 mmol/L (ref 3.5–5.3)
SODIUM: 142 mmol/L (ref 135–146)
Total Bilirubin: 0.3 mg/dL (ref 0.2–1.2)
Total Protein: 6.3 g/dL (ref 6.1–8.1)

## 2017-12-07 LAB — URINALYSIS W MICROSCOPIC + REFLEX CULTURE
Bacteria, UA: NONE SEEN /HPF
Bilirubin Urine: NEGATIVE
Glucose, UA: NEGATIVE
HYALINE CAST: NONE SEEN /LPF
Hgb urine dipstick: NEGATIVE
Ketones, ur: NEGATIVE
Leukocyte Esterase: NEGATIVE
Nitrites, Initial: NEGATIVE
PH: 7.5 (ref 5.0–8.0)
Protein, ur: NEGATIVE
SPECIFIC GRAVITY, URINE: 1.017 (ref 1.001–1.03)
WBC, UA: NONE SEEN /HPF (ref 0–5)

## 2017-12-07 LAB — LIPID PANEL
CHOLESTEROL: 195 mg/dL (ref <200)
HDL: 57 mg/dL (ref >50)
LDL Cholesterol (Calc): 110 mg/dL (calc) — ABNORMAL HIGH
Non-HDL Cholesterol (Calc): 138 mg/dL (calc) — ABNORMAL HIGH (ref <130)
Total CHOL/HDL Ratio: 3.4 (calc) (ref <5.0)
Triglycerides: 158 mg/dL — ABNORMAL HIGH (ref <150)

## 2017-12-07 LAB — NO CULTURE INDICATED

## 2017-12-07 LAB — VITAMIN D 25 HYDROXY (VIT D DEFICIENCY, FRACTURES): VIT D 25 HYDROXY: 54 ng/mL (ref 30–100)

## 2017-12-07 LAB — MAGNESIUM: MAGNESIUM: 2.1 mg/dL (ref 1.5–2.5)

## 2017-12-07 LAB — TSH: TSH: 1.81 m[IU]/L (ref 0.40–4.50)

## 2017-12-09 ENCOUNTER — Other Ambulatory Visit: Payer: Self-pay | Admitting: Orthopedic Surgery

## 2017-12-09 ENCOUNTER — Other Ambulatory Visit: Payer: Self-pay | Admitting: Internal Medicine

## 2017-12-09 DIAGNOSIS — G4700 Insomnia, unspecified: Secondary | ICD-10-CM

## 2017-12-09 DIAGNOSIS — Z1231 Encounter for screening mammogram for malignant neoplasm of breast: Secondary | ICD-10-CM

## 2017-12-30 NOTE — Addendum Note (Signed)
Addended by: Dan MakerORBETT, Luciann Gossett C on: 12/30/2017 01:10 PM   Modules accepted: Orders

## 2018-01-02 ENCOUNTER — Other Ambulatory Visit: Payer: Self-pay | Admitting: Physician Assistant

## 2018-01-02 ENCOUNTER — Other Ambulatory Visit: Payer: Self-pay | Admitting: Internal Medicine

## 2018-01-02 MED ORDER — DIAZEPAM 10 MG PO TABS
ORAL_TABLET | ORAL | 0 refills | Status: DC
Start: 2018-01-02 — End: 2018-01-02

## 2018-01-02 MED ORDER — DIAZEPAM 10 MG PO TABS: ORAL_TABLET | ORAL | 0 refills | Status: DC

## 2018-01-09 ENCOUNTER — Ambulatory Visit
Admission: RE | Admit: 2018-01-09 | Discharge: 2018-01-09 | Disposition: A | Discharge status: Home / Self Care | Payer: Medicare Other | Source: Ambulatory Visit | Attending: Orthopedic Surgery | Admitting: Orthopedic Surgery

## 2018-01-09 DIAGNOSIS — Z1231 Encounter for screening mammogram for malignant neoplasm of breast: Secondary | ICD-10-CM | POA: Diagnosis not present

## 2018-01-23 DIAGNOSIS — M1712 Unilateral primary osteoarthritis, left knee: Secondary | ICD-10-CM | POA: Diagnosis not present

## 2018-01-30 DIAGNOSIS — M1712 Unilateral primary osteoarthritis, left knee: Secondary | ICD-10-CM | POA: Insufficient documentation

## 2018-02-06 DIAGNOSIS — M1712 Unilateral primary osteoarthritis, left knee: Secondary | ICD-10-CM | POA: Diagnosis not present

## 2018-03-04 ENCOUNTER — Other Ambulatory Visit: Payer: Self-pay | Admitting: Cardiovascular Disease

## 2018-03-08 ENCOUNTER — Other Ambulatory Visit: Payer: Self-pay | Admitting: Adult Health

## 2018-03-08 DIAGNOSIS — G4700 Insomnia, unspecified: Secondary | ICD-10-CM

## 2018-05-09 DIAGNOSIS — Z23 Encounter for immunization: Secondary | ICD-10-CM | POA: Diagnosis not present

## 2018-06-01 ENCOUNTER — Other Ambulatory Visit: Payer: Self-pay

## 2018-06-02 ENCOUNTER — Other Ambulatory Visit: Payer: Self-pay | Admitting: Cardiovascular Disease

## 2018-06-02 ENCOUNTER — Other Ambulatory Visit: Payer: Self-pay | Admitting: Adult Health

## 2018-06-02 DIAGNOSIS — E7800 Pure hypercholesterolemia, unspecified: Secondary | ICD-10-CM

## 2018-06-02 DIAGNOSIS — K219 Gastro-esophageal reflux disease without esophagitis: Secondary | ICD-10-CM

## 2018-06-04 ENCOUNTER — Other Ambulatory Visit: Payer: Self-pay | Admitting: Physician Assistant

## 2018-06-04 DIAGNOSIS — G4700 Insomnia, unspecified: Secondary | ICD-10-CM

## 2018-06-08 ENCOUNTER — Encounter: Payer: Self-pay | Admitting: Internal Medicine

## 2018-06-08 ENCOUNTER — Ambulatory Visit (INDEPENDENT_AMBULATORY_CARE_PROVIDER_SITE_OTHER): Payer: Medicare Other | Admitting: Internal Medicine

## 2018-06-08 VITALS — BP 140/84 | HR 76 | Temp 97.6°F | Resp 16 | Ht 65.0 in | Wt 228.2 lb

## 2018-06-08 DIAGNOSIS — Z87891 Personal history of nicotine dependence: Secondary | ICD-10-CM

## 2018-06-08 DIAGNOSIS — Z136 Encounter for screening for cardiovascular disorders: Secondary | ICD-10-CM | POA: Diagnosis not present

## 2018-06-08 DIAGNOSIS — Z1212 Encounter for screening for malignant neoplasm of rectum: Secondary | ICD-10-CM

## 2018-06-08 DIAGNOSIS — R7303 Prediabetes: Secondary | ICD-10-CM

## 2018-06-08 DIAGNOSIS — R7309 Other abnormal glucose: Secondary | ICD-10-CM | POA: Diagnosis not present

## 2018-06-08 DIAGNOSIS — E782 Mixed hyperlipidemia: Secondary | ICD-10-CM | POA: Diagnosis not present

## 2018-06-08 DIAGNOSIS — I10 Essential (primary) hypertension: Secondary | ICD-10-CM

## 2018-06-08 DIAGNOSIS — E559 Vitamin D deficiency, unspecified: Secondary | ICD-10-CM

## 2018-06-08 DIAGNOSIS — Z6841 Body Mass Index (BMI) 40.0 and over, adult: Secondary | ICD-10-CM

## 2018-06-08 DIAGNOSIS — Z79899 Other long term (current) drug therapy: Secondary | ICD-10-CM | POA: Diagnosis not present

## 2018-06-08 DIAGNOSIS — Z8249 Family history of ischemic heart disease and other diseases of the circulatory system: Secondary | ICD-10-CM | POA: Diagnosis not present

## 2018-06-08 DIAGNOSIS — Z1211 Encounter for screening for malignant neoplasm of colon: Secondary | ICD-10-CM

## 2018-06-08 DIAGNOSIS — Z23 Encounter for immunization: Secondary | ICD-10-CM

## 2018-06-08 DIAGNOSIS — K219 Gastro-esophageal reflux disease without esophagitis: Secondary | ICD-10-CM

## 2018-06-08 NOTE — Patient Instructions (Signed)
We Do NOT Approve of  [facility] [place] Soliciting Our Patients  To Do Home Visits & We Do NOT Approve of [facility] > > > > > > > > > > > > > > > > > > > > > > > > > > > > > > > > > > > > > > >  Preventive Care for Adults  A healthy lifestyle and preventive care can promote health and wellness. Preventive health guidelines for women include the following key practices.  A routine yearly physical is a good way to check with your health care provider about your health and preventive screening. It is a chance to share any concerns and updates on your health and to receive a thorough exam.  Visit your dentist for a routine exam and preventive care every 6 months. Brush your teeth twice a day and floss once a day. Good oral hygiene prevents tooth decay and gum disease.  The frequency of eye exams is based on your age, health, family medical history, use of contact lenses, and other factors. Follow your health care provider's recommendations for frequency of eye exams.  Eat a healthy diet. Foods like vegetables, fruits, whole grains, low-fat dairy products, and lean protein foods contain the nutrients you need without too many calories. Decrease your intake of foods high in solid fats, added sugars, and salt. Eat the right amount of calories for you. Get information about a proper diet from your health care provider, if necessary.  Regular physical exercise is one of the most important things you can do for your health. Most adults should get at least 150 minutes of moderate-intensity exercise (any activity that increases your heart rate and causes you to sweat) each week. In addition, most adults need muscle-strengthening exercises on 2 or more days a week.  Maintain a healthy weight. The body mass index (BMI) is a screening tool to identify possible weight problems. It provides an estimate of body fat based on height and weight. Your health care provider can find your BMI  and can help you achieve or maintain a healthy weight. For adults 20 years and older:  A BMI below 18.5 is considered underweight.  A BMI of 18.5 to 24.9 is normal.  A BMI of 25 to 29.9 is considered overweight.  A BMI of 30 and above is considered obese.  Maintain normal blood lipids and cholesterol levels by exercising and minimizing your intake of saturated fat. Eat a balanced diet with plenty of fruit and vegetables. If your lipid or cholesterol levels are high, you are over 50, or you are at high risk for heart disease, you may need your cholesterol levels checked more frequently. Ongoing high lipid and cholesterol levels should be treated with medicines if diet and exercise are not working.  If you smoke, find out from your health care provider how to quit. If you do not use tobacco, do not start.  Lung cancer screening is recommended for adults aged 55-80 years who are at high risk for developing lung cancer because of a history of smoking. A yearly low-dose CT scan of the lungs is recommended for people who have at least a 30-pack-year history of smoking and are a current smoker or have quit within the past 15 years. A pack year of smoking is smoking an average of 1 pack of cigarettes a day for 1 year (for example: 1 pack a day for 30 years or 2 packs a day   for 15 years). Yearly screening should continue until the smoker has stopped smoking for at least 15 years. Yearly screening should be stopped for people who develop a health problem that would prevent them from having lung cancer treatment.  Avoid use of street drugs. Do not share needles with anyone. Ask for help if you need support or instructions about stopping the use of drugs.  High blood pressure causes heart disease and increases the risk of stroke.  Ongoing high blood pressure should be treated with medicines if weight loss and exercise do not work.  If you are 94-98 years old, ask your health care provider if you should take  aspirin to prevent strokes.  Diabetes screening involves taking a blood sample to check your fasting blood sugar level. This should be done once every 3 years, after age 18, if you are within normal weight and without risk factors for diabetes. Testing should be considered at a younger age or be carried out more frequently if you are overweight and have at least 1 risk factor for diabetes.  Breast cancer screening is essential preventive care for women. You should practice "breast self-awareness." This means understanding the normal appearance and feel of your breasts and may include breast self-examination. Any changes detected, no matter how small, should be reported to a health care provider. Women in their 76s and 30s should have a clinical breast exam (CBE) by a health care provider as part of a regular health exam every 1 to 3 years. After age 17, women should have a CBE every year. Starting at age 54, women should consider having a mammogram (breast X-ray test) every year. Women who have a family history of breast cancer should talk to their health care provider about genetic screening. Women at a high risk of breast cancer should talk to their health care providers about having an MRI and a mammogram every year.  Breast cancer gene (BRCA)-related cancer risk assessment is recommended for women who have family members with BRCA-related cancers. BRCA-related cancers include breast, ovarian, tubal, and peritoneal cancers. Having family members with these cancers may be associated with an increased risk for harmful changes (mutations) in the breast cancer genes BRCA1 and BRCA2. Results of the assessment will determine the need for genetic counseling and BRCA1 and BRCA2 testing.  Routine pelvic exams to screen for cancer are no longer recommended for nonpregnant women who are considered low risk for cancer of the pelvic organs (ovaries, uterus, and vagina) and who do not have symptoms. Ask your health  care provider if a screening pelvic exam is right for you.  If you have had past treatment for cervical cancer or a condition that could lead to cancer, you need Pap tests and screening for cancer for at least 20 years after your treatment. If Pap tests have been discontinued, your risk factors (such as having a new sexual partner) need to be reassessed to determine if screening should be resumed. Some women have medical problems that increase the chance of getting cervical cancer. In these cases, your health care provider may recommend more frequent screening and Pap tests.    Colorectal cancer can be detected and often prevented. Most routine colorectal cancer screening begins at the age of 89 years and continues through age 17 years. However, your health care provider may recommend screening at an earlier age if you have risk factors for colon cancer. On a yearly basis, your health care provider may provide home test kits to check  for hidden blood in the stool. Use of a small camera at the end of a tube, to directly examine the colon (sigmoidoscopy or colonoscopy), can detect the earliest forms of colorectal cancer. Talk to your health care provider about this at age 71, when routine screening begins.  Direct exam of the colon should be repeated every 5-10 years through age 65 years, unless early forms of pre-cancerous polyps or small growths are found.  Osteoporosis is a disease in which the bones lose minerals and strength with aging. This can result in serious bone fractures or breaks. The risk of osteoporosis can be identified using a bone density scan. Women ages 56 years and over and women at risk for fractures or osteoporosis should discuss screening with their health care providers. Ask your health care provider whether you should take a calcium supplement or vitamin D to reduce the rate of osteoporosis.  Menopause can be associated with physical symptoms and risks. Hormone replacement therapy  is available to decrease symptoms and risks. You should talk to your health care provider about whether hormone replacement therapy is right for you.  Use sunscreen. Apply sunscreen liberally and repeatedly throughout the day. You should seek shade when your shadow is shorter than you. Protect yourself by wearing long sleeves, pants, a wide-brimmed hat, and sunglasses year round, whenever you are outdoors.  Once a month, do a whole body skin exam, using a mirror to look at the skin on your back. Tell your health care provider of new moles, moles that have irregular borders, moles that are larger than a pencil eraser, or moles that have changed in shape or color.  Stay current with required vaccines (immunizations).  Influenza vaccine. All adults should be immunized every year.  Tetanus, diphtheria, and acellular pertussis (Td, Tdap) vaccine. Pregnant women should receive 1 dose of Tdap vaccine during each pregnancy. The dose should be obtained regardless of the length of time since the last dose. Immunization is preferred during the 27th-36th week of gestation. An adult who has not previously received Tdap or who does not know her vaccine status should receive 1 dose of Tdap. This initial dose should be followed by tetanus and diphtheria toxoids (Td) booster doses every 10 years. Adults with an unknown or incomplete history of completing a 3-dose immunization series with Td-containing vaccines should begin or complete a primary immunization series including a Tdap dose. Adults should receive a Td booster every 10 years.    Zoster vaccine. One dose is recommended for adults aged 77 years or older unless certain conditions are present.    Pneumococcal 13-valent conjugate (PCV13) vaccine. When indicated, a person who is uncertain of her immunization history and has no record of immunization should receive the PCV13 vaccine. An adult aged 55 years or older who has certain medical conditions and has not  been previously immunized should receive 1 dose of PCV13 vaccine. This PCV13 should be followed with a dose of pneumococcal polysaccharide (PPSV23) vaccine. The PPSV23 vaccine dose should be obtained at least 1 or more year(s) after the dose of PCV13 vaccine. An adult aged 15 years or older who has certain medical conditions and previously received 1 or more doses of PPSV23 vaccine should receive 1 dose of PCV13. The PCV13 vaccine dose should be obtained 1 or more years after the last PPSV23 vaccine dose.    Pneumococcal polysaccharide (PPSV23) vaccine. When PCV13 is also indicated, PCV13 should be obtained first. All adults aged 46 years and older should  be immunized. An adult younger than age 65 years who has certain medical conditions should be immunized. Any person who resides in a nursing home or long-term care facility should be immunized. An adult smoker should be immunized. People with an immunocompromised condition and certain other conditions should receive both PCV13 and PPSV23 vaccines. People with human immunodeficiency virus (HIV) infection should be immunized as soon as possible after diagnosis. Immunization during chemotherapy or radiation therapy should be avoided. Routine use of PPSV23 vaccine is not recommended for American Indians, [place] Natives, or people younger than 65 years unless there are medical conditions that require PPSV23 vaccine. When indicated, people who have unknown immunization and have no record of immunization should receive PPSV23 vaccine. One-time revaccination 5 years after the first dose of PPSV23 is recommended for people aged 19-64 years who have chronic kidney failure, nephrotic syndrome, asplenia, or immunocompromised conditions. People who received 1-2 doses of PPSV23 before age 65 years should receive another dose of PPSV23 vaccine at age 65 years or later if at least 5 years have passed since the previous dose. Doses of PPSV23 are not needed for people immunized  with PPSV23 at or after age 65 years.   Preventive Services / Frequency  Ages 65 years and over  Blood pressure check.  Lipid and cholesterol check.  Lung cancer screening. / Every year if you are aged 55-80 years and have a 30-pack-year history of smoking and currently smoke or have quit within the past 15 years. Yearly screening is stopped once you have quit smoking for at least 15 years or develop a health problem that would prevent you from having lung cancer treatment.  Clinical breast exam.** / Every year after age 40 years.   BRCA-related cancer risk assessment.** / For women who have family members with a BRCA-related cancer (breast, ovarian, tubal, or peritoneal cancers).  Mammogram.** / Every year beginning at age 40 years and continuing for as long as you are in good health. Consult with your health care provider.  Pap test.** / Every 3 years starting at age 30 years through age 65 or 70 years with 3 consecutive normal Pap tests. Testing can be stopped between 65 and 70 years with 3 consecutive normal Pap tests and no abnormal Pap or HPV tests in the past 10 years.  Fecal occult blood test (FOBT) of stool. / Every year beginning at age 50 years and continuing until age 75 years. You may not need to do this test if you get a colonoscopy every 10 years.  Flexible sigmoidoscopy or colonoscopy.** / Every 5 years for a flexible sigmoidoscopy or every 10 years for a colonoscopy beginning at age 50 years and continuing until age 75 years.  Hepatitis C blood test.** / For all people born from 1945 through 1965 and any individual with known risks for hepatitis C.  Osteoporosis screening.** / A one-time screening for women ages 65 years and over and women at risk for fractures or osteoporosis.  Skin self-exam. / Monthly.  Influenza vaccine. / Every year.  Tetanus, diphtheria, and acellular pertussis (Tdap/Td) vaccine.** / 1 dose of Td every 10 years.  Zoster vaccine.** / 1 dose  for adults aged 60 years or older.  Pneumococcal 13-valent conjugate (PCV13) vaccine.** / Consult your health care provider.  Pneumococcal polysaccharide (PPSV23) vaccine.** / 1 dose for all adults aged 65 years and older. Screening for abdominal aortic aneurysm (AAA)  by ultrasound is recommended for people who have history of high blood pressure   or who are current or former smokers. ++++++++++++++++++++ Recommend Adult Low Dose Aspirin or  coated  Aspirin 81 mg daily  To reduce risk of Colon Cancer 20 %,  Skin Cancer 26 % ,  Melanoma 46%  and  Pancreatic cancer 60% ++++++++++++++++++++ Vitamin D goal  is between 70-100.  Please make sure that you are taking your Vitamin D as directed.  It is very important as a natural anti-inflammatory  helping hair, skin, and nails, as well as reducing stroke and heart attack risk.  It helps your bones and helps with mood. It also decreases numerous cancer risks so please take it as directed.  Low Vit D is associated with a 200-300% higher risk for CANCER  and 200-300% higher risk for HEART   ATTACK  &  STROKE.   ...................................... It is also associated with higher death rate at younger ages,  autoimmune diseases like Rheumatoid arthritis, Lupus, Multiple Sclerosis.    Also many other serious conditions, like depression, Alzheimer's Dementia, infertility, muscle aches, fatigue, fibromyalgia - just to name a few. ++++++++++++++++++ Recommend the book "The END of DIETING" by Dr Joel Fuhrman  & the book "The END of DIABETES " by Dr Joel Fuhrman At [employer] - get book & Audio CD's    Being diabetic has a  300% increased risk for heart attack, stroke, cancer, and alzheimer- type vascular dementia. It is very important that you work harder with diet by avoiding all foods that are white. Avoid white rice (brown & wild rice is OK), white potatoes (sweetpotatoes in moderation is OK), White bread or wheat bread or anything made out of  white flour like bagels, donuts, rolls, buns, biscuits, cakes, pastries, cookies, pizza crust, and pasta (made from white flour & egg whites) - vegetarian pasta or spinach or wheat pasta is OK. Multigrain breads like Arnold's or Pepperidge [employer], or multigrain sandwich thins or flatbreads.  Diet, exercise and weight loss can reverse and cure diabetes in the early stages.  Diet, exercise and weight loss is very important in the control and prevention of complications of diabetes which affects every system in your body, ie. Brain - dementia/stroke, eyes - glaucoma/blindness, heart - heart attack/heart failure, kidneys - dialysis, stomach - gastric paralysis, intestines - malabsorption, nerves - severe painful neuritis, circulation - gangrene & loss of a leg(s), and finally cancer and Alzheimers.    I recommend avoid fried & greasy foods,  sweets/candy, white rice (brown or wild rice or Quinoa is OK), white potatoes (sweet potatoes are OK) - anything made from white flour - bagels, doughnuts, rolls, buns, biscuits,white and wheat breads, pizza crust and traditional pasta made of white flour & egg white(vegetarian pasta or spinach or wheat pasta is OK).  Multi-grain bread is OK - like multi-grain flat bread or sandwich thins. Avoid alcohol in excess. Exercise is also important.    Eat all the vegetables you want - avoid meat, especially red meat and dairy - especially cheese.  Cheese is the most concentrated form of trans-fats which is the worst thing to clog up our arteries. Veggie cheese is OK which can be found in the fresh produce section at [employer] or [employer] or [employer]  +++++++++++++++++++ DASH Eating Plan  DASH stands for "Dietary Approaches to Stop Hypertension."   The DASH eating plan is a healthy eating plan that has been shown to reduce high blood pressure (hypertension). Additional health benefits may include reducing the risk of type 2 diabetes mellitus, heart disease,   and stroke. The  DASH eating plan may also help with weight loss. WHAT DO I NEED TO KNOW ABOUT THE DASH EATING PLAN? For the DASH eating plan, you will follow these general guidelines:  Choose foods with a percent daily value for sodium of less than 5% (as listed on the food label).  Use salt-free seasonings or herbs instead of table salt or sea salt.  Check with your health care provider or pharmacist before using salt substitutes.  Eat lower-sodium products, often labeled as "lower sodium" or "no salt added."  Eat fresh foods.  Eat more vegetables, fruits, and low-fat dairy products.  Choose whole grains. Look for the word "whole" as the first word in the ingredient list.  Choose fish   Limit sweets, desserts, sugars, and sugary drinks.  Choose heart-healthy fats.  Eat veggie cheese   Eat more home-cooked food and less restaurant, buffet, and fast food.  Limit fried foods.  Cook foods using methods other than frying.  Limit canned vegetables. If you do use them, rinse them well to decrease the sodium.  When eating at a restaurant, ask that your food be prepared with less salt, or no salt if possible.                      WHAT FOODS CAN I EAT? Read Dr Fara Olden Fuhrman's books on The End of Dieting & The End of Diabetes  Grains Whole grain or whole wheat bread. Brown rice. Whole grain or whole wheat pasta. Quinoa, bulgur, and whole grain cereals. Low-sodium cereals. Corn or whole wheat flour tortillas. Whole grain cornbread. Whole grain crackers. Low-sodium crackers.  Vegetables Fresh or frozen vegetables (raw, steamed, roasted, or grilled). Low-sodium or reduced-sodium tomato and vegetable juices. Low-sodium or reduced-sodium tomato sauce and paste. Low-sodium or reduced-sodium canned vegetables.   Fruits All fresh, canned (in natural juice), or frozen fruits.  Protein Products  All fish and seafood.  Dried beans, peas, or lentils. Unsalted nuts and seeds. Unsalted canned  beans.  Dairy Low-fat dairy products, such as skim or 1% milk, 2% or reduced-fat cheeses, low-fat ricotta or cottage cheese, or plain low-fat yogurt. Low-sodium or reduced-sodium cheeses.  Fats and Oils Tub margarines without trans fats. Light or reduced-fat mayonnaise and salad dressings (reduced sodium). Avocado. Safflower, olive, or canola oils. Natural peanut or almond butter.  Other Unsalted popcorn and pretzels. The items listed above may not be a complete list of recommended foods or beverages. Contact your dietitian for more options.  +++++++++++++++  WHAT FOODS ARE NOT RECOMMENDED? Grains/ White flour or wheat flour White bread. White pasta. White rice. Refined cornbread. Bagels and croissants. Crackers that contain trans fat.  Vegetables  Creamed or fried vegetables. Vegetables in a . Regular canned vegetables. Regular canned tomato sauce and paste. Regular tomato and vegetable juices.  Fruits Dried fruits. Canned fruit in light or heavy syrup. Fruit juice.  Meat and Other Protein Products Meat in general - RED meat & White meat.  Fatty cuts of meat. Ribs, chicken wings, all processed meats as bacon, sausage, bologna, salami, fatback, hot dogs, bratwurst and packaged luncheon meats.  Dairy Whole or 2% milk, cream, half-and-half, and cream cheese. Whole-fat or sweetened yogurt. Full-fat cheeses or blue cheese. Non-dairy creamers and whipped toppings. Processed cheese, cheese spreads, or cheese curds.  Condiments Onion and garlic salt, seasoned salt, table salt, and sea salt. Canned and packaged gravies. Worcestershire sauce. Tartar sauce. Barbecue sauce. Teriyaki sauce. Soy sauce, including reduced  sodium. Steak sauce. Fish sauce. Oyster sauce. Cocktail sauce. Horseradish. Ketchup and mustard. Meat flavorings and tenderizers. Bouillon cubes. Hot sauce. Tabasco sauce. Marinades. Taco seasonings. Relishes.  Fats and Oils Butter, stick margarine, lard, shortening and bacon  fat. Coconut, palm kernel, or palm oils. Regular salad dressings.  Pickles and olives. Salted popcorn and pretzels.  The items listed above may not be a complete list of foods and beverages to avoid.

## 2018-06-08 NOTE — Progress Notes (Signed)
[place] [facility] [name], M.D.     Dyanne Carrel. Steffanie Dunn, P.A.-C Judd Gaudier, DNP [facility] [address] [place], [place]. [address] Telephone [phone] Telefax [phone]  Comprehensive Evaluation &  Examination     This very nice 67 y.o. MWF  presents for a  comprehensive evaluation and management of multiple medical co-morbidities.  Patient has been followed for HTN, HLD, Prediabetes  and Vitamin D Deficiency.      HTN predates since 2011. Patient's BP has been controlled at home. Patient had normal Heart Caths in 2009 and anegative 2D echo and ETT Stress test  2017.  In Nov 2018 , she had Negative Cardio-Pulmonary Exercise Testing with conclusion/ recombinations to lose weight & Exercise.  Patient denies any cardiac symptoms as chest pain, palpitations, shortness of breath, dizziness or ankle swelling. Today's BP wa initially elevated and rechecked at goal - 140/84.      Patient's hyperlipidemia is not  controlled with diet and Pravastatin (started 2011). Patient denies myalgias or other medication SE's. Last lipids were not at goal: Lab Results  Component Value Date   CHOL 195 12/06/2017   HDL 57 12/06/2017   LDLCALC 110 (H) 12/06/2017   TRIG 158 (H) 12/06/2017   CHOLHDL 3.4 12/06/2017      Patient has hx/o prediabetes (A1c 5.7%  / 2015) and patient denies reactive hypoglycemic symptoms, visual blurring, diabetic polys or paresthesias. Last A1c was near goal: Lab Results  Component Value Date   HGBA1C 5.7 (H) 12/06/2017      Finally, patient has history of Vitamin D Deficiency and last Vitamin D was near goal (70-100): Lab Results  Component Value Date   VD25OH 54 12/06/2017   Current Outpatient Medications on File Prior to Visit  Medication Sig  . aspirin 81 MG tablet Take 81 mg by mouth daily.  . cetirizine (ZYRTEC) 10 MG tablet Take 10 mg by mouth daily.  . Cholecalciferol (VITAMIN D3) 5000 UNITS  CAPS Take 5,000 Units by mouth 4 (four) times a week. Takes M,W,TH,F  . diazepam (VALIUM) 10 MG tablet Take 1/2 to 1 tablet at Bedtime ONLY if needed for Sleep  . fluticasone (FLONASE) 50 MCG/ACT nasal spray SPRAY 1 SPRAY INTO EACH NOSTRIL EVERY DAY  . pantoprazole (PROTONIX) 40 MG tablet TAKE 1 TABLET BY MOUTH EVERY DAY  . pravastatin (PRAVACHOL) 40 MG tablet TAKE 1 TABLET BY MOUTH ONCE DAILY WITH BREAKFAST  . triamterene-hydrochlorothiazide (MAXZIDE-25) 37.5-25 MG tablet Take 1 tablet by mouth at bedtime. Please schedule office visit for refills  . zolpidem (AMBIEN) 5 MG tablet TAKE 1/2 TO 1 TABLET BY MOUTH AT BEDTIME ONE HOUR BEFORE SLEEP   No current facility-administered medications on file prior to visit.    Allergies  Allergen Reactions  . Levaquin [Levofloxacin In D5w]     yeast  . Oruvail [Ketoprofen]     GI upset   Past Medical History:  Diagnosis Date  . ANAL FISSURE, HX OF 11/06/2007   Qualifier: Diagnosis of  By: Misty Stanley CMA (AAMA), Marchelle Folks    . Anxiety   . Depression   . Difficulty sleeping    takes Ambien  . Diverticulosis 5/1/5  . DJD (degenerative joint disease)   . Family history of anesthesia complication    "mother quit breathing" 30 yrs ago pt does not know any more details. Pt has never had any problems with anesthesia herself  . GERD (gastroesophageal reflux disease)   . Hyperlipidemia   .  Hypertension   . Obese   . Peripheral edema   . Psoriasis    HANDS  . Psoriasis   . Unspecified vitamin D deficiency   . Urgency of urination    Health Maintenance  Topic Date Due  . PNA vac Low Risk Adult (2 of 2 - PPSV23) 06/08/2018  . Hepatitis C Screening  05/20/2020 (Originally 01/09/1951)  . MAMMOGRAM  01/10/2019  . TETANUS/TDAP  05/20/2019  . COLONOSCOPY  11/17/2023  . INFLUENZA VACCINE  Completed  . DEXA SCAN  Completed   Immunization History  Administered Date(s) Administered  . Influenza Inj Mdck Quad With Preservative 05/17/2017  . Influenza Split  06/25/2014, 05/08/2015  . Influenza, High Dose Seasonal PF 05/09/2018  . Influenza,inj,quad, With Preservative 04/13/2016  . Influenza-Unspecified 05/28/2013  . PPD Test 08/29/2013  . Pneumococcal Conjugate-13 06/08/2017  . Pneumococcal Polysaccharide-23 06/07/2013  . Tdap 05/19/2009  . Zoster 06/25/2014   Last Colon - 11/16/2013 - Dr Lina Sarora Brodie - recc 10 year f/u due May 2015  Last MGM - 01/09/2018  Past Surgical History:  Procedure Laterality Date  . ABDOMINAL HYSTERECTOMY     partial  . APPENDECTOMY    . CARDIAC CATHETERIZATION  2005    no problems per pt   . KNEE ARTHROSCOPY     RT 2011, LT 2009  . TONSILLECTOMY AND ADENOIDECTOMY    . 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 TUBAL LIGATION     Family History  Problem Relation Age of Onset  . Stroke Mother   . Hyperlipidemia Mother   . Heart attack Mother   . Cancer Brother 1649       prostate, deceased  . Heart disease Paternal Grandfather   . Heart attack Maternal Grandfather   . Diabetes Paternal Uncle   . Colon cancer Neg Hx   . Breast cancer Neg Hx    Social History   Tobacco Use  . Smoking status: Former Smoker    Last attempt to quit: 07/19/2000    Years since quitting: 17.8  . Smokeless tobacco: Never Used  Substance Use Topics  . Alcohol use: Yes    Comment: occ  . Drug use: No    ROS Constitutional: Denies fever, chills, weight loss/gain, headaches, insomnia,  night sweats, and change in appetite. Does c/o fatigue. Eyes: Denies redness, blurred vision, diplopia, discharge, itchy, watery eyes.  ENT: Denies discharge, congestion, post nasal drip, epistaxis, sore throat, earache, hearing loss, dental pain, Tinnitus, Vertigo, Sinus pain, snoring.  Cardio: Denies chest pain, palpitations, irregular heartbeat, syncope, dyspnea, diaphoresis, orthopnea, PND, claudication,  edema Respiratory: denies cough, dyspnea, DOE, pleurisy, hoarseness, laryngitis, wheezing.  Gastrointestinal: Denies dysphagia, heartburn, reflux, water brash, pain, cramps, nausea, vomiting, bloating, diarrhea, constipation, hematemesis, melena, hematochezia, jaundice, hemorrhoids Genitourinary: Denies dysuria, frequency, urgency, nocturia, hesitancy, discharge, hematuria, flank pain Breast: Breast lumps, nipple discharge, bleeding.  Musculoskeletal: Denies arthralgia, myalgia, stiffness, Jt. Swelling, pain, limp, and strain/sprain. Denies falls. Skin: Denies puritis, rash, hives, warts, acne, eczema, changing in skin lesion Neuro: No weakness, tremor, incoordination, spasms, paresthesia, pain Psychiatric: Denies confusion, memory loss, sensory loss. Denies Depression. Endocrine: Denies change in weight, skin, hair change, nocturia, and paresthesia, diabetic polys, visual blurring, hyper / hypo glycemic episodes.  Heme/Lymph: No excessive bleeding, bruising, enlarged lymph nodes.  Physical Exam  BP (!) 154/98   Pulse 76   Temp 97.6  F (36.4 C)   Resp 16   Ht 5\' 5"  (1.651 m)   Wt 228 lb 3.2 oz (103.5 kg)   BMI 37.97 kg/m   General Appearance: Well nourished, well groomed and in no apparent distress.  Eyes: PERRLA, EOMs, conjunctiva no swelling or erythema, normal fundi and vessels. Sinuses: No frontal/maxillary tenderness ENT/Mouth: EACs patent / TMs  nl. Nares clear without erythema, swelling, mucoid exudates. Oral hygiene is good. No erythema, swelling, or exudate. Tongue normal, non-obstructing. Tonsils not swollen or erythematous. Hearing normal.  Neck: Supple, thyroid not palpable. No bruits, nodes or JVD. Respiratory: Respiratory effort normal.  BS equal and clear bilateral without rales, rhonci, wheezing or stridor. Cardio: Heart sounds are normal with regular rate and rhythm and no murmurs, rubs or gallops. Peripheral pulses are normal and equal bilaterally without edema. No  aortic or femoral bruits. Chest: symmetric with normal excursions and percussion. Breasts: Symmetric, without lumps, nipple discharge, retractions, or fibrocystic changes.  Abdomen: Flat, soft with bowel sounds active. Nontender, no guarding, rebound, hernias, masses, or organomegaly.  Lymphatics: Non tender without lymphadenopathy.  Genitourinary:  Musculoskeletal: Full ROM all peripheral extremities, joint stability, 5/5 strength, and normal gait. Skin: Warm and dry without rashes, lesions, cyanosis, clubbing or  ecchymosis.  Neuro: Cranial nerves intact, reflexes equal bilaterally. Normal muscle tone, no cerebellar symptoms. Sensation intact.  Pysch: Alert and oriented X 3, normal affect, Insight and Judgment appropriate.   Assessment and Plan  1. Essential hypertension  - EKG 12-Lead - Urinalysis, Routine w reflex microscopic - Microalbumin / creatinine urine ratio - CBC with Differential/Platelet - COMPLETE METABOLIC PANEL WITH GFR - Magnesium - TSH  2. Hyperlipidemia, mixed  - EKG 12-Lead - Lipid panel - TSH  3. Abnormal glucose  - EKG 12-Lead - Hemoglobin A1c - Insulin, random  4. Vitamin D deficiency  - VITAMIN D 25 Hydroxyl  5. Prediabetes  - EKG 12-Lead - Hemoglobin A1c - Insulin, random  6. Gastroesophageal reflux disease  - COMPLETE METABOLIC PANEL WITH GFR  7. Class 3 severe obesity due to excess calories with serious comorbidity and body mass index (BMI) of 40.0 to 44.9 in adult (HCC)  8. Screening for ischemic heart disease  - EKG 12-Lead - Lipid panel  9. FHx: heart disease  - EKG 12-Lead  10. Former smoker  - EKG 12-Lead  11. Medication management  - Urinalysis, Routine w reflex microscopic - Microalbumin / creatinine urine ratio - CBC with Differential/Platelet - COMPLETE METABOLIC PANEL WITH GFR - Magnesium - Lipid panel - TSH - Hemoglobin A1c - Insulin, random - VITAMIN D 25 Hydroxl  12. Screening for colorectal  cancer  - POC Hemoccult Bld/Stl        Patient was counseled in prudent diet to achieve/maintain BMI less than 25 for weight control, BP monitoring, regular exercise and medications. Discussed med's effects and SE's. Screening labs and tests as requested with regular follow-up as recommended. Over 40 minutes of exam, counseling, chart review and high complex critical decision making was performed.

## 2018-06-09 ENCOUNTER — Other Ambulatory Visit: Payer: Self-pay | Admitting: Internal Medicine

## 2018-06-09 DIAGNOSIS — E782 Mixed hyperlipidemia: Secondary | ICD-10-CM

## 2018-06-09 LAB — COMPLETE METABOLIC PANEL WITH GFR
AG Ratio: 2 (calc) (ref 1.0–2.5)
ALT: 15 U/L (ref 6–29)
AST: 14 U/L (ref 10–35)
Albumin: 4.3 g/dL (ref 3.6–5.1)
Alkaline phosphatase (APISO): 51 U/L (ref 33–130)
BUN/Creatinine Ratio: 30 (calc) — ABNORMAL HIGH (ref 6–22)
BUN: 28 mg/dL — AB (ref 7–25)
CALCIUM: 9.7 mg/dL (ref 8.6–10.4)
CO2: 26 mmol/L (ref 20–32)
CREATININE: 0.94 mg/dL (ref 0.50–0.99)
Chloride: 106 mmol/L (ref 98–110)
GFR, EST NON AFRICAN AMERICAN: 63 mL/min/{1.73_m2} (ref >=60)
GFR, Est African American: 73 mL/min/{1.73_m2} (ref >=60)
Globulin: 2.1 g/dL (calc) (ref 1.9–3.7)
Glucose, Bld: 81 mg/dL (ref 65–99)
Potassium: 3.9 mmol/L (ref 3.5–5.3)
Sodium: 140 mmol/L (ref 135–146)
Total Bilirubin: 0.5 mg/dL (ref 0.2–1.2)
Total Protein: 6.4 g/dL (ref 6.1–8.1)

## 2018-06-09 LAB — HEMOGLOBIN A1C
Hgb A1c MFr Bld: 5.6 % of total Hgb (ref <5.7)
Mean Plasma Glucose: 114 (calc)
eAG (mmol/L): 6.3 (calc)

## 2018-06-09 LAB — CBC WITH DIFFERENTIAL/PLATELET
Basophils Absolute: 61 cells/uL (ref 0–200)
Basophils Relative: 0.9 %
EOS PCT: 2.8 %
Eosinophils Absolute: 190 cells/uL (ref 15–500)
HEMATOCRIT: 45.1 % — AB (ref 35.0–45.0)
HEMOGLOBIN: 14.6 g/dL (ref 11.7–15.5)
Lymphs Abs: 2489 cells/uL (ref 850–3900)
MCH: 28.5 pg (ref 27.0–33.0)
MCHC: 32.4 g/dL (ref 32.0–36.0)
MCV: 87.9 fL (ref 80.0–100.0)
MPV: 11.8 fL (ref 7.5–12.5)
Monocytes Relative: 6.6 %
NEUTROS ABS: 3611 {cells}/uL (ref 1500–7800)
Neutrophils Relative %: 53.1 %
Platelets: 207 10*3/uL (ref 140–400)
RBC: 5.13 10*6/uL — AB (ref 3.80–5.10)
RDW: 12.9 % (ref 11.0–15.0)
Total Lymphocyte: 36.6 %
WBC: 6.8 10*3/uL (ref 3.8–10.8)
WBCMIX: 449 {cells}/uL (ref 200–950)

## 2018-06-09 LAB — LIPID PANEL
CHOL/HDL RATIO: 3.8 (calc) (ref <5.0)
Cholesterol: 182 mg/dL (ref <200)
HDL: 48 mg/dL — AB (ref >50)
LDL CHOLESTEROL (CALC): 113 mg/dL — AB
Non-HDL Cholesterol (Calc): 134 mg/dL (calc) — ABNORMAL HIGH (ref <130)
Triglycerides: 107 mg/dL (ref <150)

## 2018-06-09 LAB — TSH: TSH: 2.63 mIU/L (ref 0.40–4.50)

## 2018-06-09 LAB — URINALYSIS, ROUTINE W REFLEX MICROSCOPIC
Bilirubin Urine: NEGATIVE
Glucose, UA: NEGATIVE
Hgb urine dipstick: NEGATIVE
Ketones, ur: NEGATIVE
LEUKOCYTES UA: NEGATIVE
NITRITE: NEGATIVE
Protein, ur: NEGATIVE
SPECIFIC GRAVITY, URINE: 1.024 (ref 1.001–1.03)
pH: 6.5 (ref 5.0–8.0)

## 2018-06-09 LAB — MAGNESIUM: Magnesium: 1.9 mg/dL (ref 1.5–2.5)

## 2018-06-09 LAB — INSULIN, RANDOM: Insulin: 10.3 u[IU]/mL (ref 2.0–19.6)

## 2018-06-09 LAB — MICROALBUMIN / CREATININE URINE RATIO
Creatinine, Urine: 143 mg/dL (ref 20–275)
MICROALB UR: 0.6 mg/dL
MICROALB/CREAT RATIO: 4 ug/mg{creat} (ref <30)

## 2018-06-09 LAB — VITAMIN D 25 HYDROXY (VIT D DEFICIENCY, FRACTURES): VIT D 25 HYDROXY: 76 ng/mL (ref 30–100)

## 2018-06-09 MED ORDER — ROSUVASTATIN CALCIUM 40 MG PO TABS: ORAL_TABLET | ORAL | 1 refills | Status: DC

## 2018-07-06 ENCOUNTER — Ambulatory Visit (INDEPENDENT_AMBULATORY_CARE_PROVIDER_SITE_OTHER): Payer: Medicare Other | Admitting: Adult Health

## 2018-07-06 ENCOUNTER — Encounter: Payer: Self-pay | Admitting: Adult Health

## 2018-07-06 VITALS — BP 130/80 | HR 64 | Temp 97.5°F | Ht 65.0 in | Wt 224.0 lb

## 2018-07-06 DIAGNOSIS — M542 Cervicalgia: Secondary | ICD-10-CM

## 2018-07-06 DIAGNOSIS — R221 Localized swelling, mass and lump, neck: Secondary | ICD-10-CM

## 2018-07-06 LAB — CBC WITH DIFFERENTIAL/PLATELET
ABSOLUTE MONOCYTES: 475 {cells}/uL (ref 200–950)
BASOS PCT: 0.8 %
Basophils Absolute: 53 cells/uL (ref 0–200)
EOS ABS: 178 {cells}/uL (ref 15–500)
Eosinophils Relative: 2.7 %
HCT: 43.8 % (ref 35.0–45.0)
HEMOGLOBIN: 14.1 g/dL (ref 11.7–15.5)
Lymphs Abs: 2125 cells/uL (ref 850–3900)
MCH: 28.8 pg (ref 27.0–33.0)
MCHC: 32.2 g/dL (ref 32.0–36.0)
MCV: 89.6 fL (ref 80.0–100.0)
MONOS PCT: 7.2 %
MPV: 11.8 fL (ref 7.5–12.5)
NEUTROS ABS: 3769 {cells}/uL (ref 1500–7800)
Neutrophils Relative %: 57.1 %
Platelets: 220 10*3/uL (ref 140–400)
RBC: 4.89 10*6/uL (ref 3.80–5.10)
RDW: 12.8 % (ref 11.0–15.0)
TOTAL LYMPHOCYTE: 32.2 %
WBC: 6.6 10*3/uL (ref 3.8–10.8)

## 2018-07-06 MED ORDER — PREDNISONE 20 MG PO TABS: ORAL_TABLET | ORAL | 0 refills | Status: DC

## 2018-07-06 MED ORDER — AZITHROMYCIN 250 MG PO TABS: ORAL_TABLET | ORAL | 1 refills | Status: AC

## 2018-07-06 NOTE — Patient Instructions (Signed)
Follow up in 1-2 weeks if not resolving  Lymphadenopathy  Lymphadenopathy means that your lymph glands are swollen or larger than normal (enlarged). Lymph glands, also called lymph nodes, are collections of tissue that filter bacteria, viruses, and waste from your bloodstream. They are part of your body's disease-fighting system (immune system), which protects your body from germs. There may be different causes of lymphadenopathy, depending on where it is in your body. Some types go away on their own. Lymphadenopathy can occur anywhere that you have lymph glands, including these areas:  Neck (cervical lymphadenopathy).  Chest (mediastinal lymphadenopathy).  Lungs (hilar lymphadenopathy).  Underarms (axillary lymphadenopathy).  Groin (inguinal lymphadenopathy). When your immune system responds to germs, infection-fighting cells and fluid build up in your lymph glands. This causes some swelling and enlargement. If the lymph glands do not go back to normal after you have an infection or disease, your health care provider may do tests. These tests help to monitor your condition and find the reason why the glands are still swollen and enlarged. Follow these instructions at home:  Get plenty of rest.  Take over-the-counter and prescription medicines only as told by your health care provider. Your health care provider may recommend over-the-counter medicines for pain.  If directed, apply heat to swollen lymph glands as often as told by your health care provider. Use the heat source that your health care provider recommends, such as a moist heat pack or a heating pad. ? Place a towel between your skin and the heat source. ? Leave the heat on for 20-30 minutes. ? Remove the heat if your skin turns bright red. This is especially important if you are unable to feel pain, heat, or cold. You may have a greater risk of getting burned.  Check your affected lymph glands every day for changes. Check  other lymph gland areas as told by your health care provider. Check for changes such as: ? More swelling. ? Sudden increase in size. ? Redness or pain. ? Hardness.  Keep all follow-up visits as told by your health care provider. This is important. Contact a health care provider if you have:  Swelling that gets worse or spreads to other areas.  Problems with breathing.  Lymph glands that: ? Are still swollen after 2 weeks. ? Have suddenly gotten bigger. ? Are red, painful, or hard.  A fever or chills.  Fatigue.  A sore throat.  Pain in your abdomen.  Weight loss.  Night sweats. Get help right away if you have:  Fluid leaking from an enlarged lymph gland.  Severe pain.  Chest pain.  Shortness of breath. Summary  Lymphadenopathy means that your lymph glands are swollen or larger than normal (enlarged).  Lymph glands (also called lymph nodes) are collections of tissue that filter bacteria, viruses, and waste from the bloodstream. They are part of your body's disease-fighting system (immune system).  Lymphadenopathy can occur anywhere that you have lymph glands.  If your enlarged and swollen lymph glands do not go back to normal after you have an infection or disease, your health care provider may do tests to monitor your condition and find the reason why the glands are still swollen and enlarged.  Check your affected lymph glands every day for changes. Check other lymph gland areas as told by your health care provider. This information is not intended to replace advice given to you by your health care provider. Make sure you discuss any questions you have with your health care  provider. Document Released: 04/13/2008 Document Revised: 05/20/2017 Document Reviewed: 05/20/2017 Elsevier Interactive Patient Education  2019 [employer]

## 2018-07-06 NOTE — Progress Notes (Signed)
Assessment and Plan:  Adela LankJacqueline was seen today for acute visit.  Diagnoses and all orders for this visit:  Localized swelling, mass and lump, neck Acute edema/tenderness of unclear origin, nearly resolved by time of evaluation today ? Simple inflammatory reaction or URI; will check CBC today, give steroid, abx, have patient monitor subtle remaining swelling/tenderness which I anticipate will resolve. She is to follow up in 1-2 weeks if this is persisting and we will proceed with imaging at that time.  -     CBC with Differential/Platelet -     predniSONE (DELTASONE) 20 MG tablet; 2 tablets daily for 3 days, 1 tablet daily for 4 days. -     azithromycin (ZITHROMAX) 250 MG tablet; Take 2 tablets (500 mg) on  Day 1,  followed by 1 tablet (250 mg) once daily on Days 2 through 5.  Further disposition pending results of labs. Discussed med's effects and SE's.   Over 15 minutes of exam, counseling, chart review, and critical decision making was performed.   Future Appointments  Date Time Provider Department Center  09/12/2018 11:00 AM Judd Gaudierorbett, Sion Reinders, NP [facility] None  12/20/2018  2:00 PM Judd Gaudierorbett, Oluwatobi Visser, NP [facility] None  07/03/2019 10:00 AM Lucky CowboyMcKeown, William, MD [facility] None    ------------------------------------------------------------------------------------------------------------------   HPI BP 130/80   Pulse 64   Temp (!) 97.5 F (36.4 C)   Ht 5\' 5"  (1.651 m)   Wt 224 lb (101.6 kg)   SpO2 99%   BMI 37.28 kg/m   67 y.o.female presents for evaluation of R sided throat pain, noted palpable enlargement/tenderness of R upper neck, ear pressure/pain, and a "wet" sensation of the R ear that began suddenly/drastically yesterday, but then quickly resolving and nearly back to baseline other than subtle swelling of R upper neck. She denies fever/chills. Symptoms came on very suddenly yesterday, denies any cough, hoarseness, nasal congestion. Pain only with palpation at this time,  reports was much worse last night. She endorses mild sore throat prior to onset of symptoms. She has not tried any interventions.   She is a former smoker, quit in 2002.   Past Medical History:  Diagnosis Date  . ANAL FISSURE, HX OF 11/06/2007   Qualifier: Diagnosis of  By: Misty StanleyLewellyn CMA (AAMA), Marchelle FolksAmanda    . Anxiety   . Depression   . Difficulty sleeping    takes Ambien  . Diverticulosis 5/1/5  . DJD (degenerative joint disease)   . Family history of anesthesia complication    "mother quit breathing" 30 yrs ago pt does not know any more details. Pt has never had any problems with anesthesia herself  . GERD (gastroesophageal reflux disease)   . Hyperlipidemia   . Hypertension   . Obese   . Peripheral edema   . Psoriasis    HANDS  . Psoriasis   . Unspecified vitamin D deficiency   . Urgency of urination      Allergies  Allergen Reactions  . Doxycycline Other (See Comments)    Jittery and causes nausea  . Levaquin [Levofloxacin In D5w]     yeast  . Oruvail [Ketoprofen]     GI upset    Current Outpatient Medications on File Prior to Visit  Medication Sig  . aspirin 81 MG tablet Take 81 mg by mouth daily.  . cetirizine (ZYRTEC) 10 MG tablet Take 10 mg by mouth daily.  . Cholecalciferol (VITAMIN D3) 5000 UNITS CAPS Take 5,000 Units by mouth 4 (four) times a week. Takes M,W,TH,F  .  diazepam (VALIUM) 10 MG tablet Take 1/2 to 1 tablet at Bedtime ONLY if needed for Sleep  . fluticasone (FLONASE) 50 MCG/ACT nasal spray SPRAY 1 SPRAY INTO EACH NOSTRIL EVERY DAY  . pantoprazole (PROTONIX) 40 MG tablet TAKE 1 TABLET BY MOUTH EVERY DAY  . pravastatin (PRAVACHOL) 40 MG tablet TAKE 1 TABLET BY MOUTH ONCE DAILY WITH BREAKFAST  . rosuvastatin (CRESTOR) 40 MG tablet Take 1/2 to 1 tablet daily or as directed for Cholesterol  . triamterene-hydrochlorothiazide (MAXZIDE-25) 37.5-25 MG tablet Take 1 tablet by mouth at bedtime. Please schedule office visit for refills  . zolpidem (AMBIEN) 5 MG  tablet TAKE 1/2 TO 1 TABLET BY MOUTH AT BEDTIME ONE HOUR BEFORE SLEEP   No current facility-administered medications on file prior to visit.     ROS: all negative except above.   Physical Exam:  BP 130/80   Pulse 64   Temp (!) 97.5 F (36.4 C)   Ht 5\' 5"  (1.651 m)   Wt 224 lb (101.6 kg)   SpO2 99%   BMI 37.28 kg/m   General Appearance: Well nourished, in no apparent distress. Eyes: PERRLA, EOMs, conjunctiva no swelling or erythema Sinuses: No Frontal/maxillary tenderness ENT/Mouth: Ext aud canals clear other than small amount of small wax, TMs without erythema, bulging. No erythema, swelling, or exudate on post pharynx.  Tonsils absent. Dentition is good, no notable abscess, gingivitis, sublingual growth, edema, tenderness. Hearing normal. Subtle mildly tender enlargement of R submandibular region without distinct borders, nodules. TMJ joint intact without tenderness.  Neck: Supple, thyroid normal.  Respiratory: Respiratory effort normal, BS equal bilaterally without rales, rhonchi, wheezing or stridor.  Cardio: RRR with no MRGs. Brisk peripheral pulses without edema.  Abdomen: Soft, + BS.  Non tender. Lymphatics: Mildly tender through R submandibular/upper anterior cervical chain with questionable single enlarged node Musculoskeletal: normal gait.  Skin: Warm, dry without rashes, lesions, ecchymosis.  Neuro: Cranial nerves intact. Normal muscle tone, no cerebellar symptoms.  Psych: Awake and oriented X 3, normal affect, Insight and Judgment appropriate.     Dan MakerAshley C Ketrina Boateng, NP 10:46 AM [facility]

## 2018-08-01 ENCOUNTER — Ambulatory Visit (INDEPENDENT_AMBULATORY_CARE_PROVIDER_SITE_OTHER): Payer: Medicare Other | Admitting: Physician Assistant

## 2018-08-01 ENCOUNTER — Encounter: Payer: Self-pay | Admitting: Physician Assistant

## 2018-08-01 ENCOUNTER — Other Ambulatory Visit: Payer: Self-pay

## 2018-08-01 VITALS — BP 132/80 | HR 70 | Temp 97.7°F | Ht 65.0 in | Wt 222.2 lb

## 2018-08-01 DIAGNOSIS — Z1212 Encounter for screening for malignant neoplasm of rectum: Principal | ICD-10-CM

## 2018-08-01 DIAGNOSIS — K1120 Sialoadenitis, unspecified: Secondary | ICD-10-CM | POA: Diagnosis not present

## 2018-08-01 DIAGNOSIS — Z1211 Encounter for screening for malignant neoplasm of colon: Secondary | ICD-10-CM

## 2018-08-01 LAB — POC HEMOCCULT BLD/STL (HOME/3-CARD/SCREEN)
FECAL OCCULT BLD: NEGATIVE
FECAL OCCULT BLD: NEGATIVE
Fecal Occult Blood, POC: NEGATIVE

## 2018-08-01 MED ORDER — PREDNISONE 20 MG PO TABS: ORAL_TABLET | ORAL | 0 refills | Status: DC

## 2018-08-01 NOTE — Progress Notes (Signed)
Subjective:    Patient ID: Jade Singh, female    DOB: Oct 28, 1950, 68 y.o.   MRN: [id number]  HPI 68 y.o. WF presents with right sided mass/lump.  She was given zpak/prednisone 07/07/2019, states it improved until yesterday. Then yesterday while eating sweet potato she felt right throat discomfort, she has to stop eating due to throat pain, however she states no food getting caught, does not feel lump in throat. When she has the pain she feels a lump under her right jaw. States gets worse with food.  No fever, chills. No dry mouth. Will have ear pain as well.   History of smoking, quit 2000. Wt Readings from Last 3 Encounters:  08/01/18 222 lb 3.2 oz (100.8 kg)  07/06/18 224 lb (101.6 kg)  06/08/18 228 lb 3.2 oz (103.5 kg)    Height 5\' 5"  (1.651 m), weight 222 lb 3.2 oz (100.8 kg).  Medications Current Outpatient Medications on File Prior to Visit  Medication Sig  . aspirin 81 MG tablet Take 81 mg by mouth daily.  . cetirizine (ZYRTEC) 10 MG tablet Take 10 mg by mouth daily.  . Cholecalciferol (VITAMIN D3) 5000 UNITS CAPS Take 5,000 Units by mouth 4 (four) times a week. Takes M,W,TH,F  . diazepam (VALIUM) 10 MG tablet Take 1/2 to 1 tablet at Bedtime ONLY if needed for Sleep  . fluticasone (FLONASE) 50 MCG/ACT nasal spray SPRAY 1 SPRAY INTO EACH NOSTRIL EVERY DAY  . pantoprazole (PROTONIX) 40 MG tablet TAKE 1 TABLET BY MOUTH EVERY DAY  . pravastatin (PRAVACHOL) 40 MG tablet TAKE 1 TABLET BY MOUTH ONCE DAILY WITH BREAKFAST  . rosuvastatin (CRESTOR) 40 MG tablet Take 1/2 to 1 tablet daily or as directed for Cholesterol  . triamterene-hydrochlorothiazide (MAXZIDE-25) 37.5-25 MG tablet Take 1 tablet by mouth at bedtime. Please schedule office visit for refills  . zolpidem (AMBIEN) 5 MG tablet TAKE 1/2 TO 1 TABLET BY MOUTH AT BEDTIME ONE HOUR BEFORE SLEEP   No current facility-administered medications on file prior to visit.     Problem list She has Anxiety state; Depression;  IBS; Vitamin D deficiency; Hypertension; Hypercholesterolemia; GERD (gastroesophageal reflux disease); Morbid obesity (HCC); Right knee DJD; Prediabetes; Diverticulosis; Dyspnea on exertion; and Normal coronary arteries on their problem list.  Review of Systems  Constitutional: Negative for activity change, appetite change, chills, diaphoresis, fatigue and fever.  HENT: Positive for ear pain and sore throat. Negative for congestion, dental problem, facial swelling, postnasal drip, rhinorrhea, sinus pressure, sinus pain, sneezing, tinnitus, trouble swallowing and voice change.   Respiratory: Negative.  Negative for cough and shortness of breath.   Cardiovascular: Negative.   Gastrointestinal: Negative.   Musculoskeletal: Positive for neck pain. Negative for myalgias and neck stiffness.  Neurological: Negative.  Negative for dizziness and headaches.       Objective:   Physical Exam Constitutional:      General: She is not in acute distress.    Appearance: Normal appearance. She is not ill-appearing.  HENT:     Head:     Jaw: No trismus, tenderness or pain on movement.     Salivary Glands: Right salivary gland is diffusely enlarged (under tongue on the right with tender hard nodule 1 cm in size, smooth and round) and tender. Left salivary gland is not diffusely enlarged or tender.     Right Ear: Tympanic membrane and ear canal normal.     Left Ear: Tympanic membrane and ear canal normal.     Nose:  Nose normal.     Mouth/Throat:     Lips: Pink.     Mouth: Mucous membranes are dry.     Dentition: Normal dentition.     Palate: No mass. Palate elevates in midline.     Pharynx: Oropharynx is clear. No oropharyngeal exudate or posterior oropharyngeal erythema.     Tonsils: No tonsillar exudate or tonsillar abscesses.     Comments: Mild tender right submandibular nodule soft about 2 cm, round edges.  Eyes:     Pupils: Pupils are equal, round, and reactive to light.  Neck:      Musculoskeletal: Normal range of motion and neck supple.     Thyroid: No thyroid mass.  Cardiovascular:     Rate and Rhythm: Normal rate.  Pulmonary:     Effort: Pulmonary effort is normal.     Breath sounds: Normal breath sounds.  Neurological:     Mental Status: She is alert.         Assessment & Plan:  Parotitis Rule out mass, possible block duct Will do prednisone, sour candy, heating pad Will refer to ENT if it returns- patient will call- declines referral at this time.    The patient was advised to call immediately if she has any concerning symptoms in the interval. The patient voices understanding of current treatment options and is in agreement with the current care plan.The patient knows to call the clinic with any problems, questions or concerns or go to the ER if any further progression of symptoms.   Future Appointments  Date Time Provider Department Center  09/12/2018 11:00 AM Judd Gaudier, NP [facility] None  12/20/2018  2:00 PM Judd Gaudier, NP [facility] None  07/03/2019 10:00 AM [name], MD [facility] None

## 2018-08-01 NOTE — Patient Instructions (Addendum)
Sugar free sour candy Prednisone Heating pad  Will refer to ENT if it not resolved Parotitis  Parotitis is inflammation of one or both of your parotid glands. These glands produce saliva. They are found on each side of your face, below and in front of your earlobes. The saliva that they produce comes out of tiny openings (ducts) inside your cheeks. Parotitis may cause sudden swelling and pain (acute parotitis). It can also cause repeated episodes of swelling and pain or continued swelling that may or may not be painful (chronic parotitis). What are the causes? This condition may be caused by:  Infections from bacteria.  Infections from viruses, such as mumps or HIV.  Blockage (obstruction) of saliva flow through the parotid glands. This can be from a stone, scar tissue, or a tumor.  Diseases that cause your body's defense system (immune system) to attack healthy cells in your salivary glands. These are called autoimmune diseases. What increases the risk? You are more likely to develop this condition if:  You are 69 years old or older.  You do not drink enough fluids (are dehydrated).  You drink too much alcohol.  You have: ? A dry mouth. ? Poor dental hygiene. ? Diabetes. ? Gout. ? A long-term illness.  You have had radiation treatments to the head and neck.  You take certain medicines. What are the signs or symptoms? Symptoms of this condition depend on the cause. Symptoms may include:  Swelling under and in front of the ear. This may get worse after eating.  Redness of the skin over the parotid gland.  Pain and tenderness over the parotid gland. This may get worse after eating.  Fever or chills.  Pus coming from the ducts inside the mouth.  Dry mouth.  A bad taste in the mouth. How is this diagnosed? This condition may be diagnosed based on:  Your medical history.  A physical exam.  Tests to find the cause of the parotitis. These may include: ? Doing  blood tests to check for an autoimmune disease or infections from a virus. ? Taking a fluid sample from the parotid gland and testing it for infection. ? Injecting the ducts of the parotid gland with a dye and then taking X-rays (sialogram). ? Having other imaging tests of the gland, such as X-rays, ultrasound, MRI, or CT scan. ? Checking the opening of the gland for a stone or obstruction. ? Placing a needle into the gland to remove tissue for a biopsy (fine needle aspiration). How is this treated? Treatment for this condition depends on the cause. Treatment may include:  Antibiotic medicine for a bacterial infection.  Drinking more fluids.  Removing a stone or obstruction.  Treating an underlying disease that is causing parotitis.  Surgery to drain an infection, remove a growth, or remove the whole gland (parotidectomy). Treatment may not be needed if parotid swelling goes away with home care. Follow these instructions at home: Medicines   Take over-the-counter and prescription medicines only as told by your health care provider.  If you were prescribed an antibiotic medicine, take it as told by your health care provider. Do not stop taking the antibiotic even if you start to feel better. Managing pain and swelling  If directed, apply heat to the affected area as often as told by your health care provider. Use the heat source that your health care provider recommends, such as a moist heat pack or a heating pad. To apply the heat: ? Place a  towel between your skin and the heat source. ? Leave the heat on for 20-30 minutes. ? Remove the heat if your skin turns bright red. This is especially important if you are unable to feel pain, heat, or cold. You may have a greater risk of getting burned.  Gargle with a salt-water mixture 3-4 times a day or as needed. To make a salt-water mixture, completely dissolve -1 tsp (3-6 g) of salt in 1 cup (237 mL) of warm water.  Gently massage the  parotid glands as told by your health care provider. General instructions   Drink enough fluid to keep your urine pale yellow.  Keep your mouth clean and moist.  Try sucking on sour candy. This may help to make your mouth less dry by stimulating the flow of saliva.  Maintain good oral health. ? Brush your teeth at least two times a day. ? Floss your teeth every day. ? See your dentist regularly.  Do not use any products that contain nicotine or tobacco, such as cigarettes, e-cigarettes, and chewing tobacco. If you need help quitting, ask your health care provider.  Do not drink alcohol.  Keep all follow-up visits as told by your health care provider. This is important. Contact a health care provider if:  You have a fever or chills.  You have new symptoms.  Your symptoms get worse.  Your symptoms do not improve with treatment. Get help right away if:  You have difficulty breathing or swallowing because of the swollen gland. Summary  Parotitis is inflammation of one or both of your parotid glands.  Symptoms include pain and swelling under and in front of the ear. They may also include a fever and a bad taste in your mouth.  This condition may be treated with antibiotics, increasing fluids, or surgery.  In some cases, parotitis may go away on its own without treatment.  You should drink plenty of fluids, maintain good oral hygiene, and avoid tobacco products. This information is not intended to replace advice given to you by your health care provider. Make sure you discuss any questions you have with your health care provider. Document Released: 12/25/2001 Document Revised: 01/31/2018 Document Reviewed: 01/31/2018 Elsevier Interactive Patient Education  2019 [employer].

## 2018-08-02 DIAGNOSIS — Z1211 Encounter for screening for malignant neoplasm of colon: Secondary | ICD-10-CM | POA: Diagnosis not present

## 2018-08-05 ENCOUNTER — Other Ambulatory Visit: Payer: Self-pay | Admitting: Cardiovascular Disease

## 2018-08-07 NOTE — Telephone Encounter (Signed)
Rx has been sent to the pharmacy electronically. ° °

## 2018-09-03 ENCOUNTER — Other Ambulatory Visit: Payer: Self-pay | Admitting: Physician Assistant

## 2018-09-03 DIAGNOSIS — G4700 Insomnia, unspecified: Secondary | ICD-10-CM

## 2018-09-06 NOTE — Progress Notes (Signed)
FOLLOW UP  Assessment and Plan:   Hypertension Well controlled with current medications  Monitor blood pressure at home; patient to call if consistently greater than 130/80 Continue DASH diet.   Reminder to go to the ER if any CP, SOB, nausea, dizziness, severe HA, changes vision/speech, left arm numbness and tingling and jaw pain.  Cholesterol Currently above goal; newly on rosuvastatin 20 mg every other day and doing well Continue low cholesterol diet and exercise.  Check lipid panel.   Other abnormal glucose Recent A1Cs at goal Discussed diet/exercise, weight management  Defer A1C; check CMP  Obesity with co morbidities Commended on progress thus far Long discussion about weight loss, diet, and exercise Recommended diet heavy in fruits and veggies and low in animal meats, cheeses, and dairy products, appropriate calorie intake Discussed ideal weight for height and initial weight goal (<200lb) Patient will continue with weight watchers Will follow up in 3 months  Vitamin D Def At goal at last visit; continue supplementation to maintain goal of 70-100 Defer Vit D level  GERD Well managed on current medications; hasn't tolerated taper off PPI; continue protonix 40 mg Monitor vit D, calcium, magnesium levels  Discussed diet, avoiding triggers and other lifestyle changes  Depression/anxiety In remission; has benzo but uses rarely Lifestyle discussed: diet/exerise, sleep hygiene, stress management, hydration   Continue diet and meds as discussed. Further disposition pending results of labs. Discussed med's effects and SE's.   Over 30 minutes of exam, counseling, chart review, and critical decision making was performed.   Future Appointments  Date Time Provider Department Center  12/20/2018  2:00 PM Judd Gaudierorbett, Mazikeen Hehn, NP [facility] None  07/03/2019 10:00 AM Lucky CowboyMcKeown, William, MD [facility] None     ----------------------------------------------------------------------------------------------------------------------  HPI 68 y.o. female  presents for 3 month follow up on hypertension, cholesterol, glucose management, weight and vitamin D deficiency. She is followed by cardiology annually, and by Dr. Jillyn HiddenBean for right knee arthritis s/p TKA but with ongoing stiffness/pain significantly limiting her activity levels.   she has a diagnosis of GERD which is currently managed by protonix 40 mg daily (failed transition to famotidine).  she reports symptoms is currently well controlled, and denies breakthrough reflux, burning in chest, hoarseness or cough.    she has a diagnosis of anxiety and is currently on valium 10 mg PRN, reports symptoms are well controlled on current regimen. she takes very rarely. She is prescribed ambien 5 mg daily for sleep and reports she has done very well with this without SE.   BMI is Body mass index is 36.08 kg/m., she has been working on diet and exercise, doing weight watchers, down 26# since Oct 2019. She does steps at home for exercise.  Wt Readings from Last 3 Encounters:  09/12/18 216 lb 12.8 oz (98.3 kg)  08/01/18 222 lb 3.2 oz (100.8 kg)  07/06/18 224 lb (101.6 kg)   Her blood pressure has been controlled at home, today their BP is BP: 124/80  She does not workout. She denies chest pain, shortness of breath, dizziness.   She is on cholesterol medication, recently switched to Rosuvastatin 20 mg every other day and denies myalgias. Her cholesterol is not at goal. The cholesterol last visit was:   Lab Results  Component Value Date   CHOL 182 06/08/2018   HDL 48 (L) 06/08/2018   LDLCALC 113 (H) 06/08/2018   TRIG 107 06/08/2018   CHOLHDL 3.8 06/08/2018    She has been working on diet and exercise for  glucose management, and denies increased appetite, nausea, paresthesia of the feet, polydipsia, polyuria and visual disturbances. Last A1C in the office  was:  Lab Results  Component Value Date   HGBA1C 5.6 06/08/2018   Patient is on Vitamin D supplement.   Lab Results  Component Value Date   VD25OH 76 06/08/2018        Current Medications:  Current Outpatient Medications on File Prior to Visit  Medication Sig  . aspirin 81 MG tablet Take 81 mg by mouth daily.  . cetirizine (ZYRTEC) 10 MG tablet Take 10 mg by mouth daily.  . Cholecalciferol (VITAMIN D3) 5000 UNITS CAPS Take 5,000 Units by mouth 4 (four) times a week. Takes M,W,TH,F  . diazepam (VALIUM) 10 MG tablet Take 1/2 to 1 tablet at Bedtime ONLY if needed for Sleep  . fluticasone (FLONASE) 50 MCG/ACT nasal spray SPRAY 1 SPRAY INTO EACH NOSTRIL EVERY DAY  . pantoprazole (PROTONIX) 40 MG tablet TAKE 1 TABLET BY MOUTH EVERY DAY  . rosuvastatin (CRESTOR) 40 MG tablet Take 1/2 to 1 tablet daily or as directed for Cholesterol  . triamterene-hydrochlorothiazide (MAXZIDE-25) 37.5-25 MG tablet TAKE 1 TABLET BY MOUTH AT BEDTIME. PLEASE SCHEDULE OFFICE VISIT FOR REFILLS  . zolpidem (AMBIEN) 5 MG tablet TAKE 1/2 TO 1 TABLET BY MOUTH AT BEDTIME ONE HOUR BEFORE SLEEP   No current facility-administered medications on file prior to visit.      Allergies:  Allergies  Allergen Reactions  . Doxycycline Other (See Comments)    Jittery and causes nausea  . Levaquin [Levofloxacin In D5w]     yeast  . Oruvail [Ketoprofen]     GI upset     Medical History:  Past Medical History:  Diagnosis Date  . ANAL FISSURE, HX OF 11/06/2007   Qualifier: Diagnosis of  By: Misty Stanley CMA (AAMA), Marchelle Folks    . Anxiety   . Depression   . Difficulty sleeping    takes Ambien  . Diverticulosis 5/1/5  . DJD (degenerative joint disease)   . Family history of anesthesia complication    "mother quit breathing" 30 yrs ago pt does not know any more details. Pt has never had any problems with anesthesia herself  . GERD (gastroesophageal reflux disease)   . Hyperlipidemia   . Hypertension   . Obese   .  Peripheral edema   . Psoriasis    HANDS  . Psoriasis   . Unspecified vitamin D deficiency   . Urgency of urination    Family history- Reviewed and unchanged Social history- Reviewed and unchanged   Review of Systems:  Review of Systems  Constitutional: Negative for malaise/fatigue and weight loss.  HENT: Negative for hearing loss and tinnitus.   Eyes: Negative for blurred vision and double vision.  Respiratory: Negative for cough, shortness of breath and wheezing.   Cardiovascular: Negative for chest pain, palpitations, orthopnea, claudication and leg swelling.  Gastrointestinal: Negative for abdominal pain, blood in stool, constipation, diarrhea, heartburn, melena, nausea and vomiting.  Genitourinary: Negative.   Musculoskeletal: Positive for joint pain (bilateral knees; pending L TKA). Negative for myalgias.  Skin: Negative for rash.  Neurological: Negative for dizziness, tingling, sensory change, weakness and headaches.  Endo/Heme/Allergies: Negative for polydipsia.  Psychiatric/Behavioral: Negative.   All other systems reviewed and are negative.    Physical Exam: BP 124/80   Pulse 64   Temp (!) 97.3 F (36.3 C)   Ht 5\' 5"  (1.651 m)   Wt 216 lb 12.8 oz (98.3 kg)  SpO2 99%   BMI 36.08 kg/m  Wt Readings from Last 3 Encounters:  09/12/18 216 lb 12.8 oz (98.3 kg)  08/01/18 222 lb 3.2 oz (100.8 kg)  07/06/18 224 lb (101.6 kg)   General Appearance: Well nourished, in no apparent distress. Eyes: PERRLA, EOMs, conjunctiva no swelling or erythema Sinuses: No Frontal/maxillary tenderness ENT/Mouth: Ext aud canals clear, TMs without erythema, bulging. No erythema, swelling, or exudate on post pharynx.  Tonsils not swollen or erythematous. Hearing normal.  Neck: Supple, thyroid normal.  Respiratory: Respiratory effort normal, BS equal bilaterally without rales, rhonchi, wheezing or stridor.  Cardio: RRR with no MRGs. Brisk peripheral pulses without edema.  Abdomen: Soft, +  BS.  Non tender, no guarding, rebound, hernias, masses. Lymphatics: Non tender without lymphadenopathy.  Musculoskeletal: Full ROM, 5/5 strength, antalgic gait gait Skin: Warm, dry without rashes, lesions, ecchymosis.  Neuro: Cranial nerves intact. No cerebellar symptoms.  Psych: Awake and oriented X 3, normal affect, Insight and Judgment appropriate.    Dan Maker, NP 11:24 AM [facility]

## 2018-09-12 ENCOUNTER — Encounter: Payer: Self-pay | Admitting: Adult Health

## 2018-09-12 ENCOUNTER — Ambulatory Visit (INDEPENDENT_AMBULATORY_CARE_PROVIDER_SITE_OTHER): Payer: Medicare Other | Admitting: Adult Health

## 2018-09-12 VITALS — BP 124/80 | HR 64 | Temp 97.3°F | Ht 65.0 in | Wt 216.8 lb

## 2018-09-12 DIAGNOSIS — E559 Vitamin D deficiency, unspecified: Secondary | ICD-10-CM | POA: Diagnosis not present

## 2018-09-12 DIAGNOSIS — F411 Generalized anxiety disorder: Secondary | ICD-10-CM

## 2018-09-12 DIAGNOSIS — R7309 Other abnormal glucose: Secondary | ICD-10-CM | POA: Diagnosis not present

## 2018-09-12 DIAGNOSIS — E7800 Pure hypercholesterolemia, unspecified: Secondary | ICD-10-CM

## 2018-09-12 DIAGNOSIS — I10 Essential (primary) hypertension: Secondary | ICD-10-CM

## 2018-09-12 DIAGNOSIS — F3342 Major depressive disorder, recurrent, in full remission: Secondary | ICD-10-CM

## 2018-09-12 DIAGNOSIS — K219 Gastro-esophageal reflux disease without esophagitis: Secondary | ICD-10-CM | POA: Diagnosis not present

## 2018-09-12 NOTE — Patient Instructions (Addendum)
Goals    . Blood Pressure < 130/80    . Weight (lb) < 200 lb (90.7 kg)      Know what a healthy weight is for you (roughly BMI <25) and aim to maintain this  Aim for 7+ servings of fruits and vegetables daily  65-80+ fluid ounces of water or unsweet tea for healthy kidneys  Limit to max 1 drink of alcohol per day; avoid smoking/tobacco  Limit animal fats in diet for cholesterol and heart health - choose grass fed whenever available  Avoid highly processed foods, and foods high in saturated/trans fats  Aim for low stress - take time to unwind and care for your mental health  Aim for 150 min of moderate intensity exercise weekly for heart health, and weights twice weekly for bone health  Aim for 7-9 hours of sleep daily      When it comes to diets, agreement about the perfect plan isn't easy to find, even among the experts. Experts at the [employer] developed an idea known as the Healthy Eating Plate. Just imagine a plate divided into logical, healthy portions.  The emphasis is on diet quality:  Load up on vegetables and fruits - one-half of your plate: Aim for color and variety, and remember that potatoes don't count.  Go for whole grains - one-quarter of your plate: Whole wheat, barley, wheat berries, quinoa, oats, brown rice, and foods made with them. If you want pasta, go with whole wheat pasta.  Protein power - one-quarter of your plate: Fish, chicken, beans, and nuts are all healthy, versatile protein sources. Limit red meat.  The diet, however, does go beyond the plate, offering a few other suggestions.  Use healthy plant oils, such as olive, canola, soy, corn, sunflower and peanut. Check the labels, and avoid partially hydrogenated oil, which have unhealthy trans fats.  If you're thirsty, drink water. Coffee and tea are good in moderation, but skip sugary drinks and limit milk and dairy products to one or two daily servings.  The type of  carbohydrate in the diet is more important than the amount. Some sources of carbohydrates, such as vegetables, fruits, whole grains, and beans-are healthier than others.  Finally, stay active.  

## 2018-09-13 LAB — CBC WITH DIFFERENTIAL/PLATELET
ABSOLUTE MONOCYTES: 391 {cells}/uL (ref 200–950)
BASOS ABS: 63 {cells}/uL (ref 0–200)
Basophils Relative: 1 %
Eosinophils Absolute: 202 cells/uL (ref 15–500)
Eosinophils Relative: 3.2 %
HEMATOCRIT: 43.2 % (ref 35.0–45.0)
Hemoglobin: 13.9 g/dL (ref 11.7–15.5)
LYMPHS ABS: 2394 {cells}/uL (ref 850–3900)
MCH: 29.1 pg (ref 27.0–33.0)
MCHC: 32.2 g/dL (ref 32.0–36.0)
MCV: 90.4 fL (ref 80.0–100.0)
MPV: 11.8 fL (ref 7.5–12.5)
Monocytes Relative: 6.2 %
NEUTROS PCT: 51.6 %
Neutro Abs: 3251 cells/uL (ref 1500–7800)
Platelets: 231 10*3/uL (ref 140–400)
RBC: 4.78 10*6/uL (ref 3.80–5.10)
RDW: 12.8 % (ref 11.0–15.0)
TOTAL LYMPHOCYTE: 38 %
WBC: 6.3 10*3/uL (ref 3.8–10.8)

## 2018-09-13 LAB — LIPID PANEL
CHOLESTEROL: 126 mg/dL (ref <200)
HDL: 48 mg/dL — ABNORMAL LOW (ref >=50)
LDL Cholesterol (Calc): 61 mg/dL (calc)
NON-HDL CHOLESTEROL (CALC): 78 mg/dL (ref <130)
Total CHOL/HDL Ratio: 2.6 (calc) (ref <5.0)
Triglycerides: 86 mg/dL (ref <150)

## 2018-09-13 LAB — COMPLETE METABOLIC PANEL WITH GFR
AG Ratio: 2.3 (calc) (ref 1.0–2.5)
ALBUMIN MSPROF: 4.2 g/dL (ref 3.6–5.1)
ALKALINE PHOSPHATASE (APISO): 57 U/L (ref 37–153)
ALT: 18 U/L (ref 6–29)
AST: 14 U/L (ref 10–35)
BUN / CREAT RATIO: 34 (calc) — AB (ref 6–22)
BUN: 29 mg/dL — ABNORMAL HIGH (ref 7–25)
CO2: 30 mmol/L (ref 20–32)
Calcium: 9.4 mg/dL (ref 8.6–10.4)
Chloride: 107 mmol/L (ref 98–110)
Creat: 0.86 mg/dL (ref 0.50–0.99)
GFR, EST AFRICAN AMERICAN: 82 mL/min/{1.73_m2} (ref >=60)
GFR, EST NON AFRICAN AMERICAN: 70 mL/min/{1.73_m2} (ref >=60)
GLUCOSE: 97 mg/dL (ref 65–99)
Globulin: 1.8 g/dL (calc) — ABNORMAL LOW (ref 1.9–3.7)
POTASSIUM: 4.3 mmol/L (ref 3.5–5.3)
SODIUM: 144 mmol/L (ref 135–146)
Total Bilirubin: 0.4 mg/dL (ref 0.2–1.2)
Total Protein: 6 g/dL — ABNORMAL LOW (ref 6.1–8.1)

## 2018-09-13 LAB — TSH: TSH: 1.9 m[IU]/L (ref 0.40–4.50)

## 2018-09-13 LAB — MAGNESIUM: Magnesium: 2 mg/dL (ref 1.5–2.5)

## 2018-09-25 DIAGNOSIS — M1712 Unilateral primary osteoarthritis, left knee: Secondary | ICD-10-CM | POA: Diagnosis not present

## 2018-10-02 DIAGNOSIS — M1712 Unilateral primary osteoarthritis, left knee: Secondary | ICD-10-CM | POA: Diagnosis not present

## 2018-10-09 DIAGNOSIS — M1712 Unilateral primary osteoarthritis, left knee: Secondary | ICD-10-CM | POA: Diagnosis not present

## 2018-10-28 ENCOUNTER — Other Ambulatory Visit: Payer: Self-pay | Admitting: Internal Medicine

## 2018-10-28 DIAGNOSIS — E782 Mixed hyperlipidemia: Secondary | ICD-10-CM

## 2018-10-28 MED ORDER — ROSUVASTATIN CALCIUM 40 MG PO TABS: ORAL_TABLET | ORAL | 3 refills | Status: DC

## 2018-11-27 ENCOUNTER — Other Ambulatory Visit: Payer: Self-pay | Admitting: Internal Medicine

## 2018-11-27 DIAGNOSIS — Z1231 Encounter for screening mammogram for malignant neoplasm of breast: Secondary | ICD-10-CM

## 2018-11-30 ENCOUNTER — Other Ambulatory Visit: Payer: Self-pay | Admitting: Adult Health

## 2018-11-30 DIAGNOSIS — K219 Gastro-esophageal reflux disease without esophagitis: Secondary | ICD-10-CM

## 2018-12-04 ENCOUNTER — Other Ambulatory Visit: Payer: Self-pay | Admitting: Physician Assistant

## 2018-12-04 DIAGNOSIS — G4700 Insomnia, unspecified: Secondary | ICD-10-CM

## 2018-12-12 ENCOUNTER — Encounter: Payer: Self-pay | Admitting: Adult Health

## 2018-12-18 NOTE — Progress Notes (Signed)
MEDICARE ANNUAL WELLNESS VISIT AND FOLLOW UP  Assessment:   Encounter for Annual Medicare visit Due annually   Essential hypertension Continue medications Monitor blood pressure at home; call if consistently over 130/80 Continue DASH diet.   Reminder to go to the ER if any CP, SOB, nausea, dizziness, severe HA, changes vision/speech, left arm numbness and tingling and jaw pain. Followed by cardiology as well  Irritable bowel syndrome, unspecified type Avoid triggers, add soluble fiber if not taking regularly  Gastroesophageal reflux disease, esophagitis presence not specified Well managed on current medications Discussed diet, avoiding triggers and other lifestyle changes  Diverticulosis Increase soluble fiber, UTD on colonoscopies  Vitamin D deficiency Continue supplementation Check vitamin D level  Prediabetes Discussed disease and risks Discussed diet/exercise, weight management  A1C  Hypercholesterolemia Continue medications Continue low cholesterol diet and exercise.  Check lipid panel.   Recurrent major depressive disorder, in full remission (HCC) Continue medications  Lifestyle discussed: diet/exerise, sleep hygiene, stress management, hydration  Anxiety state Well managed by current regimen;  uses benzo rarely Stress management techniques discussed, increase water, good sleep hygiene discussed, increase exercise, and increase veggies.   Anemia, unspecified type CBC  Medication management CBC, CMP/GFR, UA, magnesium  Morbid obesity Long discussion about weight loss, diet, and exercise Recommended diet heavy in fruits and veggies and low in animal meats, cheeses, and dairy products, appropriate calorie intake Patient will work on restarting weight watchers Discussed appropriate weight for height  Follow up at next visit   Over 40 minutes of exam, counseling, chart review and critical decision making was performed Future Appointments  Date Time  Provider Department Center  01/17/2019  2:50 PM [facility]  07/03/2019 10:00 AM [name], MD [facility] None     Plan:   During the course of the visit the patient was educated and counseled about appropriate screening and preventive services including:    Pneumococcal vaccine   Prevnar 13  Influenza vaccine  Td vaccine  Screening electrocardiogram  Bone densitometry screening  Colorectal cancer screening  Diabetes screening  Glaucoma screening  Nutrition counseling   Advanced directives: requested   Subjective:  Jade Singh is a 68 y.o. female who presents for Medicare Annual Wellness Visit and 3 month follow up.   Mother has cancer, lives next to her and she is helping up, some stress trying to care and coordinate.   She is followed by cardiology annually.  Was having exertional dyspnea, saw cardiology with normal coronary arteries, has since lost weight and reports symptoms much improved.   she has a diagnosis of anxiety and is currently on valium 10 mg PRN, reports symptoms are well controlled on current regimen. she takes very rarely, last was several months ago. She is prescribed ambien 5 mg daily for sleep and reports she has done very well with this without SE.   BMI is Body mass index is 35.78 kg/m., she has been working on diet and exercise. She is doing weight watchers, weight loss progress is slower since covid 19 and not going to meetings. She is down 28 lb since October. She drinks 65+ fluid ounces of water daily  Wt Readings from Last 3 Encounters:  12/20/18 215 lb (97.5 kg)  09/12/18 216 lb 12.8 oz (98.3 kg)  08/01/18 222 lb 3.2 oz (100.8 kg)   Her blood pressure has been controlled at home (130s/70s), today their BP is BP: 138/74  She does workout. She denies chest pain, shortness  of breath, dizziness.   She is on cholesterol medication (rosuvastatin 20 mg MWFSat) and denies myalgias. Her cholesterol is at  goal. The cholesterol last visit was:   Lab Results  Component Value Date   CHOL 126 09/12/2018   HDL 48 (L) 09/12/2018   LDLCALC 61 09/12/2018   TRIG 86 09/12/2018   CHOLHDL 2.6 09/12/2018    She has been working on diet and exercise for prediabetes, and denies increased appetite, nausea, paresthesia of the feet, polydipsia, polyuria and visual disturbances. Last A1C in the office was:  Lab Results  Component Value Date   HGBA1C 5.6 06/08/2018   Patient is on Vitamin D supplement.   Lab Results  Component Value Date   VD25OH 76 06/08/2018       Medication Review: Current Outpatient Medications on File Prior to Visit  Medication Sig Dispense Refill  . aspirin 81 MG tablet Take 81 mg by mouth daily.    . cetirizine (ZYRTEC) 10 MG tablet Take 10 mg by mouth daily.    . Cholecalciferol (VITAMIN D3) 5000 UNITS CAPS Take 5,000 Units by mouth 4 (four) times a week. Takes M,W,TH,F    . diazepam (VALIUM) 10 MG tablet Take 1/2 to 1 tablet at Bedtime ONLY if needed for Sleep 90 tablet 0  . pantoprazole (PROTONIX) 40 MG tablet TAKE 1 TABLET BY MOUTH EVERY DAY 90 tablet 1  . rosuvastatin (CRESTOR) 40 MG tablet Take 1 tablet daily for Cholesterol 90 tablet 3  . triamterene-hydrochlorothiazide (MAXZIDE-25) 37.5-25 MG tablet TAKE 1 TABLET BY MOUTH AT BEDTIME. PLEASE SCHEDULE OFFICE VISIT FOR REFILLS 15 tablet 0  . zolpidem (AMBIEN) 5 MG tablet TAKE 1/2 TO 1 TABLET BY MOUTH AT BEDTIME ONE HOUR BEFORE SLEEP 30 tablet 2   No current facility-administered medications on file prior to visit.     Allergies  Allergen Reactions  . Doxycycline Other (See Comments)    Jittery and causes nausea  . Levaquin [Levofloxacin In D5w]     yeast  . Oruvail [Ketoprofen]     GI upset    Current Problems (verified) Patient Active Problem List   Diagnosis Date Noted  . Normal coronary arteries 05/04/2017  . Diverticulosis   . Other abnormal glucose 06/25/2014  . Vitamin D deficiency   . Hypertension    . Hypercholesterolemia   . GERD (gastroesophageal reflux disease)   . Morbid obesity (HCC)   . Anxiety state 11/06/2007  . Depression 11/06/2007  . IBS 11/06/2007    Screening Tests Immunization History  Administered Date(s) Administered  . Influenza Inj Mdck Quad With Preservative 05/17/2017  . Influenza Split 06/25/2014, 05/08/2015  . Influenza, High Dose Seasonal PF 05/09/2018  . Influenza,inj,quad, With Preservative 04/13/2016  . Influenza-Unspecified 05/28/2013  . PPD Test 08/29/2013  . Pneumococcal Conjugate-13 06/08/2017  . Pneumococcal Polysaccharide-23 06/07/2013, 06/08/2018  . Tdap 05/19/2009  . Zoster 06/25/2014   Tetanus: 2010 due 05/2019 Pneumovax: 2014, 2019  Prevnar 13: 2018  Flu vaccine: 2019 Zostavax: 2015  Pap: S/p hysterectomy MGM: 12/2017 DEXA: 2013 WNL - will schedule follow up Colonoscopy: 2015 EGD: -   Last Eye Exam: [facility], [facility] Last Dental Exam: [facility] 2020 q68months  Patient Care Team: [name], MD as PCP - General (Internal Medicine) Thurmon Fair, MD as Consulting Physician (Cardiology) Jene Every, MD as Consulting Physician (Orthopedic Surgery)  SURGICAL HISTORY She  has a past surgical history that includes Knee arthroscopy; Appendectomy; Tubal ligation; Abdominal hysterectomy; Tonsillectomy and adenoidectomy; Cardiac catheterization (2005);  and Total knee arthroplasty (Right, 02/28/2014). FAMILY HISTORY Her family history includes Cancer (age of onset: 74) in her brother; Diabetes in her paternal uncle; Heart attack in her maternal grandfather and mother; Heart disease in her paternal grandfather; Hyperlipidemia in her mother; Stroke in her mother. SOCIAL HISTORY She  reports that she quit smoking about 18 years ago. She has never used smokeless tobacco. She reports current alcohol use. She reports that she does not use drugs.   MEDICARE WELLNESS OBJECTIVES: Physical activity: Current Exercise Habits: Home  exercise routine, Type of exercise: walking;strength training/weights, Time (Minutes): 15, Frequency (Times/Week): 2, Weekly Exercise (Minutes/Week): 30, Intensity: Mild, Exercise limited by: orthopedic condition(s) Cardiac risk factors: Cardiac Risk Factors include: advanced age (>66men, >4 women);dyslipidemia;hypertension;sedentary lifestyle;obesity (BMI >30kg/m2) Depression/mood screen:   Depression screen [facility] 2/9 12/20/2018  Decreased Interest 0  Down, Depressed, Hopeless 0  PHQ - 2 Score 0  Altered sleeping -  Tired, decreased energy -  Change in appetite -  Feeling bad or failure about yourself  -  Trouble concentrating -  Moving slowly or fidgety/restless -  Suicidal thoughts -  PHQ-9 Score -  Difficult doing work/chores -    ADLs:  In your present state of health, do you have any difficulty performing the following activities: 12/20/2018 06/10/2018  Hearing? N N  Vision? N N  Difficulty concentrating or making decisions? N N  Walking or climbing stairs? N N  Dressing or bathing? N N  Doing errands, shopping? N N  Some recent data might be hidden     Cognitive Testing  Alert? Yes  Normal Appearance?Yes  Oriented to person? Yes  Place? Yes   Time? Yes  Recall of three objects?  Yes  Can perform simple calculations? Yes  Displays appropriate judgment?Yes  Can read the correct time from a watch face?Yes  EOL planning: Does Patient Have a Medical Advance Directive?: No Would patient like information on creating a medical advance directive?: No - Patient declined  Review of Systems  Constitutional: Negative for malaise/fatigue and weight loss.  HENT: Negative for hearing loss and tinnitus.   Eyes: Negative for blurred vision and double vision.  Respiratory: Negative for cough, sputum production, shortness of breath and wheezing.   Cardiovascular: Negative for chest pain, palpitations, orthopnea, claudication, leg swelling and PND.  Gastrointestinal: Negative for abdominal  pain, blood in stool, constipation, diarrhea, heartburn, melena, nausea and vomiting.  Genitourinary: Negative.   Musculoskeletal: Negative for falls, joint pain and myalgias.  Skin: Negative for rash.  Neurological: Negative for dizziness, tingling, sensory change, weakness and headaches.  Endo/Heme/Allergies: Negative for polydipsia.  Psychiatric/Behavioral: Negative for depression, memory loss, substance abuse and suicidal ideas. The patient is not nervous/anxious and does not have insomnia (Controlled on ambien).   All other systems reviewed and are negative.    Objective:     Today's Vitals   12/20/18 1354 12/20/18 1448  BP: (!) 146/74 138/74  Pulse: 86   Temp: 97.9 F (36.6 C)   SpO2: 97%   Weight: 215 lb (97.5 kg)   Height:  (1.651 m)    Body mass index is 35.78 kg/m.  General appearance: alert, no distress, WD/WN, female HEENT: normocephalic, sclerae anicteric, TMs pearly, nares patent, no discharge or erythema, pharynx normal Oral cavity: MMM, no lesions Neck: supple, no lymphadenopathy, no thyromegaly, no masses Heart: RRR, normal S1, S2, no murmurs Lungs: CTA bilaterally, no wheezes, rhonchi, or rales Abdomen: +bs, soft, non tender, non distended, no masses, no hepatomegaly,  no splenomegaly Musculoskeletal: nontender, no swelling, no obvious deformity, ROM of R knee limited in flexion Extremities: no edema, no cyanosis, no clubbing Pulses: 2+ symmetric, upper and lower extremities, normal cap refill Neurological: alert, oriented x 3, CN2-12 intact, strength normal upper extremities and lower extremities, sensation normal throughout, DTRs 2+ throughout, no cerebellar signs, gait normal Psychiatric: normal affect, behavior normal, pleasant   Medicare Attestation I have personally reviewed: The patient's medical and social history Their use of alcohol, tobacco or illicit drugs Their current medications and supplements The patient's functional ability including  ADLs,fall risks, home safety risks, cognitive, and hearing and visual impairment Diet and physical activities Evidence for depression or mood disorders  The patient's weight, height, BMI, and visual acuity have been recorded in the chart.  I have made referrals, counseling, and provided education to the patient based on review of the above and I have provided the patient with a written personalized care plan for preventive services.     Dan MakerAshley C Sheri Gatchel, NP   12/20/2018

## 2018-12-20 ENCOUNTER — Ambulatory Visit (INDEPENDENT_AMBULATORY_CARE_PROVIDER_SITE_OTHER): Payer: Medicare Other | Admitting: Adult Health

## 2018-12-20 ENCOUNTER — Encounter: Payer: Self-pay | Admitting: Adult Health

## 2018-12-20 ENCOUNTER — Other Ambulatory Visit: Payer: Self-pay

## 2018-12-20 VITALS — BP 138/74 | HR 86 | Temp 97.9°F | Ht 65.0 in | Wt 215.0 lb

## 2018-12-20 DIAGNOSIS — F3342 Major depressive disorder, recurrent, in full remission: Secondary | ICD-10-CM | POA: Diagnosis not present

## 2018-12-20 DIAGNOSIS — E7800 Pure hypercholesterolemia, unspecified: Secondary | ICD-10-CM

## 2018-12-20 DIAGNOSIS — K219 Gastro-esophageal reflux disease without esophagitis: Secondary | ICD-10-CM | POA: Diagnosis not present

## 2018-12-20 DIAGNOSIS — Z0001 Encounter for general adult medical examination with abnormal findings: Secondary | ICD-10-CM

## 2018-12-20 DIAGNOSIS — R0609 Other forms of dyspnea: Secondary | ICD-10-CM | POA: Diagnosis not present

## 2018-12-20 DIAGNOSIS — K5790 Diverticulosis of intestine, part unspecified, without perforation or abscess without bleeding: Secondary | ICD-10-CM

## 2018-12-20 DIAGNOSIS — E2839 Other primary ovarian failure: Secondary | ICD-10-CM

## 2018-12-20 DIAGNOSIS — F411 Generalized anxiety disorder: Secondary | ICD-10-CM | POA: Diagnosis not present

## 2018-12-20 DIAGNOSIS — Z0000 Encounter for general adult medical examination without abnormal findings: Secondary | ICD-10-CM

## 2018-12-20 DIAGNOSIS — K589 Irritable bowel syndrome without diarrhea: Secondary | ICD-10-CM | POA: Diagnosis not present

## 2018-12-20 DIAGNOSIS — R6889 Other general symptoms and signs: Secondary | ICD-10-CM

## 2018-12-20 DIAGNOSIS — IMO0001 Reserved for inherently not codable concepts without codable children: Secondary | ICD-10-CM

## 2018-12-20 DIAGNOSIS — M1711 Unilateral primary osteoarthritis, right knee: Secondary | ICD-10-CM

## 2018-12-20 DIAGNOSIS — R7309 Other abnormal glucose: Secondary | ICD-10-CM | POA: Diagnosis not present

## 2018-12-20 DIAGNOSIS — E559 Vitamin D deficiency, unspecified: Secondary | ICD-10-CM

## 2018-12-20 DIAGNOSIS — I10 Essential (primary) hypertension: Secondary | ICD-10-CM

## 2018-12-20 DIAGNOSIS — Z0389 Encounter for observation for other suspected diseases and conditions ruled out: Secondary | ICD-10-CM | POA: Diagnosis not present

## 2018-12-20 NOTE — Patient Instructions (Addendum)
Jade Singh , Thank you for taking time to come for your Medicare Wellness Visit. I appreciate your ongoing commitment to your health goals. Please review the following plan we discussed and let me know if I can assist you in the future.   These are the goals we discussed: Goals    . Blood Pressure < 130/80    . Exercise 150 min/wk Moderate Activity    . Weight (lb) < 200 lb (90.7 kg)       This is a list of the screening recommended for you and due dates:  Health Maintenance  Topic Date Due  .  Hepatitis C: One time screening is recommended by Center for Disease Control  (CDC) for  adults born from [birth year] through 1964.   05/20/2020*  . Mammogram  01/10/2019  . Flu Shot  02/17/2019  . Tetanus Vaccine  05/20/2019  . Colon Cancer Screening  11/17/2023  . DEXA scan (bone density measurement)  Completed  . Pneumonia vaccines  Completed  *Topic was postponed. The date shown is not the original due date.     Exercising to Stay Healthy To become healthy and stay healthy, it is recommended that you do moderate-intensity and vigorous-intensity exercise. You can tell that you are exercising at a moderate intensity if your heart starts beating faster and you start breathing faster but can still hold a conversation. You can tell that you are exercising at a vigorous intensity if you are breathing much harder and faster and cannot hold a conversation while exercising. Exercising regularly is important. It has many health benefits, such as:  Improving overall fitness, flexibility, and endurance.  Increasing bone density.  Helping with weight control.  Decreasing body fat.  Increasing muscle strength.  Reducing stress and tension.  Improving overall health. How often should I exercise? Choose an activity that you enjoy, and set realistic goals. Your health care provider can help you make an activity plan that works for you. Exercise regularly as told by your health care provider. This may  include:  Doing strength training two times a week, such as: ? Lifting weights. ? Using resistance bands. ? Push-ups. ? Sit-ups. ? Yoga.  Doing a certain intensity of exercise for a given amount of time. Choose from these options: ? A total of 150 minutes of moderate-intensity exercise every week. ? A total of 75 minutes of vigorous-intensity exercise every week. ? A mix of moderate-intensity and vigorous-intensity exercise every week. Children, pregnant women, people who have not exercised regularly, people who are overweight, and older adults may need to talk with a health care provider about what activities are safe to do. If you have a medical condition, be sure to talk with your health care provider before you start a new exercise program. What are some exercise ideas? Moderate-intensity exercise ideas include:  Walking 1 mile (1.6 km) in about 15 minutes.  Biking.  Hiking.  Golfing.  Dancing.  Water aerobics. Vigorous-intensity exercise ideas include:  Walking 4.5 miles (7.2 km) or more in about 1 hour.  Jogging or running 5 miles (8 km) in about 1 hour.  Biking 10 miles (16.1 km) or more in about 1 hour.  Lap swimming.  Roller-skating or in-line skating.  Cross-country skiing.  Vigorous competitive sports, such as football, basketball, and soccer.  Jumping rope.  Aerobic dancing. What are some everyday activities that can help me to get exercise?  Yard work, such as: ? Pushing a Surveyor, mining. ? Raking and bagging  leaves.  Washing your car.  Pushing a stroller.  Shoveling snow.  Gardening.  Washing windows or floors. How can I be more active in my day-to-day activities?  Use stairs instead of an elevator.  Take a walk during your lunch break.  If you drive, park your car farther away from your work or school.  If you take public transportation, get off one stop early and walk the rest of the way.  Stand up or walk around during all of your  indoor phone calls.  Get up, stretch, and walk around every 30 minutes throughout the day.  Enjoy exercise with a friend. Support to continue exercising will help you keep a regular routine of activity. What guidelines can I follow while exercising?  Before you start a new exercise program, talk with your health care provider.  Do not exercise so much that you hurt yourself, feel dizzy, or get very short of breath.  Wear comfortable clothes and wear shoes with good support.  Drink plenty of water while you exercise to prevent dehydration or heat stroke.  Work out until your breathing and your heartbeat get faster. Where to find more information  [place] Department of Health and Human Services: ThisPath.fiwww.hhs.gov  Centers for Disease Control and Prevention (CDC): FootballExhibition.com.brwww.cdc.gov Summary  Exercising regularly is important. It will improve your overall fitness, flexibility, and endurance.  Regular exercise also will improve your overall health. It can help you control your weight, reduce stress, and improve your bone density.  Do not exercise so much that you hurt yourself, feel dizzy, or get very short of breath.  Before you start a new exercise program, talk with your health care provider. This information is not intended to replace advice given to you by your health care provider. Make sure you discuss any questions you have with your health care provider. Document Released: 08/07/2010 Document Revised: 05/26/2017 Document Reviewed: 05/26/2017 Elsevier Interactive Patient Education  2019 [employer]

## 2018-12-21 LAB — COMPLETE METABOLIC PANEL WITH GFR
AG Ratio: 2.1 (calc) (ref 1.0–2.5)
ALT: 22 U/L (ref 6–29)
AST: 20 U/L (ref 10–35)
Albumin: 4.2 g/dL (ref 3.6–5.1)
Alkaline phosphatase (APISO): 61 U/L (ref 37–153)
BUN/Creatinine Ratio: 34 (calc) — ABNORMAL HIGH (ref 6–22)
BUN: 28 mg/dL — ABNORMAL HIGH (ref 7–25)
CO2: 25 mmol/L (ref 20–32)
Calcium: 9.5 mg/dL (ref 8.6–10.4)
Chloride: 107 mmol/L (ref 98–110)
Creat: 0.82 mg/dL (ref 0.50–0.99)
GFR, Est African American: 86 mL/min/{1.73_m2} (ref >=60)
GFR, Est Non African American: 75 mL/min/{1.73_m2} (ref >=60)
Globulin: 2 g/dL (calc) (ref 1.9–3.7)
Glucose, Bld: 87 mg/dL (ref 65–99)
Potassium: 4.3 mmol/L (ref 3.5–5.3)
Sodium: 142 mmol/L (ref 135–146)
Total Bilirubin: 0.4 mg/dL (ref 0.2–1.2)
Total Protein: 6.2 g/dL (ref 6.1–8.1)

## 2018-12-21 LAB — HEMOGLOBIN A1C
Hgb A1c MFr Bld: 5.5 % of total Hgb (ref <5.7)
Mean Plasma Glucose: 111 (calc)
eAG (mmol/L): 6.2 (calc)

## 2018-12-21 LAB — CBC WITH DIFFERENTIAL/PLATELET
Absolute Monocytes: 423 cells/uL (ref 200–950)
Basophils Absolute: 58 cells/uL (ref 0–200)
Basophils Relative: 0.7 %
Eosinophils Absolute: 224 cells/uL (ref 15–500)
Eosinophils Relative: 2.7 %
HCT: 43.2 % (ref 35.0–45.0)
Hemoglobin: 13.7 g/dL (ref 11.7–15.5)
Lymphs Abs: 2482 cells/uL (ref 850–3900)
MCH: 28.4 pg (ref 27.0–33.0)
MCHC: 31.7 g/dL — ABNORMAL LOW (ref 32.0–36.0)
MCV: 89.6 fL (ref 80.0–100.0)
MPV: 11.4 fL (ref 7.5–12.5)
Monocytes Relative: 5.1 %
Neutro Abs: 5113 cells/uL (ref 1500–7800)
Neutrophils Relative %: 61.6 %
Platelets: 219 10*3/uL (ref 140–400)
RBC: 4.82 10*6/uL (ref 3.80–5.10)
RDW: 12.7 % (ref 11.0–15.0)
Total Lymphocyte: 29.9 %
WBC: 8.3 10*3/uL (ref 3.8–10.8)

## 2018-12-21 LAB — TSH: TSH: 2.03 mIU/L (ref 0.40–4.50)

## 2018-12-21 LAB — LIPID PANEL
Cholesterol: 148 mg/dL (ref <200)
HDL: 59 mg/dL (ref >=50)
LDL Cholesterol (Calc): 74 mg/dL (calc)
Non-HDL Cholesterol (Calc): 89 mg/dL (calc) (ref <130)
Total CHOL/HDL Ratio: 2.5 (calc) (ref <5.0)
Triglycerides: 72 mg/dL (ref <150)

## 2018-12-21 LAB — MAGNESIUM: Magnesium: 2 mg/dL (ref 1.5–2.5)

## 2019-01-17 ENCOUNTER — Other Ambulatory Visit: Payer: Self-pay

## 2019-01-17 ENCOUNTER — Ambulatory Visit
Admission: RE | Admit: 2019-01-17 | Discharge: 2019-01-17 | Disposition: A | Discharge status: Home / Self Care | Payer: Medicare Other | Source: Ambulatory Visit | Attending: Internal Medicine | Admitting: Internal Medicine

## 2019-01-17 DIAGNOSIS — Z1231 Encounter for screening mammogram for malignant neoplasm of breast: Secondary | ICD-10-CM

## 2019-02-02 ENCOUNTER — Other Ambulatory Visit: Payer: Self-pay | Admitting: Internal Medicine

## 2019-02-13 ENCOUNTER — Telehealth: Payer: Self-pay | Admitting: *Deleted

## 2019-02-13 NOTE — Telephone Encounter (Signed)
Patient advised she will have to pay cash for Diazepam.  She is aware and had already purchased the RX.

## 2019-03-06 ENCOUNTER — Other Ambulatory Visit: Payer: Self-pay | Admitting: Physician Assistant

## 2019-03-06 DIAGNOSIS — G4700 Insomnia, unspecified: Secondary | ICD-10-CM

## 2019-03-07 ENCOUNTER — Other Ambulatory Visit: Payer: Medicare Other

## 2019-03-16 DIAGNOSIS — M25552 Pain in left hip: Secondary | ICD-10-CM | POA: Diagnosis not present

## 2019-03-26 ENCOUNTER — Other Ambulatory Visit: Payer: Self-pay

## 2019-03-27 NOTE — Telephone Encounter (Signed)
LOV 04-2017 needs f/u appt for refills

## 2019-03-28 ENCOUNTER — Other Ambulatory Visit: Payer: Self-pay | Admitting: Cardiovascular Disease

## 2019-03-28 NOTE — Telephone Encounter (Signed)
 *  STAT* If patient is at the pharmacy, call can be transferred to refill team.   1. Which medications need to be refilled? (please list name of each medication and dose if known) triamterene-hydrochlorothiazide (MAXZIDE-25) 37.5-25 MG tablet  2. Which pharmacy/location (including street and city if local pharmacy) is medication to be sent to? [facility]  3. Do they need a 30 day or 90 day supply?30  Patient has made appt with [facility] on 05/08/19

## 2019-03-28 NOTE — Telephone Encounter (Signed)
ERROR DISREGARD LAST COMMENT

## 2019-03-28 NOTE — Telephone Encounter (Signed)
LM2CB-NEEDS APPT FOR REFILL

## 2019-03-28 NOTE — Telephone Encounter (Signed)
CALLED PT AND MADE APPT REFILL SENT

## 2019-03-30 MED ORDER — TRIAMTERENE-HCTZ 37.5-25 MG PO TABS: 1.0000 | ORAL_TABLET | Freq: Every day | ORAL | 0 refills | Status: DC

## 2019-04-03 DIAGNOSIS — Z23 Encounter for immunization: Secondary | ICD-10-CM | POA: Diagnosis not present

## 2019-04-04 ENCOUNTER — Encounter (HOSPITAL_COMMUNITY): Payer: Self-pay | Admitting: Emergency Medicine

## 2019-04-04 ENCOUNTER — Other Ambulatory Visit: Payer: Self-pay

## 2019-04-04 ENCOUNTER — Emergency Department (HOSPITAL_COMMUNITY)
Admission: EM | Admit: 2019-04-04 | Discharge: 2019-04-04 | Disposition: A | Discharge status: Home / Self Care | Payer: Medicare Other | Attending: Emergency Medicine | Admitting: Emergency Medicine

## 2019-04-04 ENCOUNTER — Telehealth: Payer: Self-pay | Admitting: Cardiovascular Disease

## 2019-04-04 ENCOUNTER — Emergency Department (HOSPITAL_COMMUNITY): Payer: Medicare Other

## 2019-04-04 DIAGNOSIS — R51 Headache: Secondary | ICD-10-CM | POA: Diagnosis not present

## 2019-04-04 DIAGNOSIS — Z79899 Other long term (current) drug therapy: Secondary | ICD-10-CM | POA: Diagnosis not present

## 2019-04-04 DIAGNOSIS — I10 Essential (primary) hypertension: Secondary | ICD-10-CM | POA: Diagnosis not present

## 2019-04-04 DIAGNOSIS — Z7982 Long term (current) use of aspirin: Secondary | ICD-10-CM | POA: Insufficient documentation

## 2019-04-04 DIAGNOSIS — Z87891 Personal history of nicotine dependence: Secondary | ICD-10-CM | POA: Insufficient documentation

## 2019-04-04 DIAGNOSIS — R0789 Other chest pain: Secondary | ICD-10-CM | POA: Diagnosis not present

## 2019-04-04 DIAGNOSIS — R079 Chest pain, unspecified: Secondary | ICD-10-CM | POA: Insufficient documentation

## 2019-04-04 LAB — CBC
HCT: 47.1 % — ABNORMAL HIGH (ref 36.0–46.0)
Hemoglobin: 14.9 g/dL (ref 12.0–15.0)
MCH: 28.8 pg (ref 26.0–34.0)
MCHC: 31.6 g/dL (ref 30.0–36.0)
MCV: 90.9 fL (ref 80.0–100.0)
Platelets: 242 10*3/uL (ref 150–400)
RBC: 5.18 MIL/uL — ABNORMAL HIGH (ref 3.87–5.11)
RDW: 13.8 % (ref 11.5–15.5)
WBC: 9.9 10*3/uL (ref 4.0–10.5)
nRBC: 0 % (ref 0.0–0.2)

## 2019-04-04 LAB — COMPREHENSIVE METABOLIC PANEL
ALT: 23 U/L (ref 0–44)
AST: 18 U/L (ref 15–41)
Albumin: 4 g/dL (ref 3.5–5.0)
Alkaline Phosphatase: 58 U/L (ref 38–126)
Anion gap: 12 (ref 5–15)
BUN: 30 mg/dL — ABNORMAL HIGH (ref 8–23)
CO2: 23 mmol/L (ref 22–32)
Calcium: 9 mg/dL (ref 8.9–10.3)
Chloride: 105 mmol/L (ref 98–111)
Creatinine, Ser: 0.93 mg/dL (ref 0.44–1.00)
GFR calc Af Amer: >60 mL/min (ref >60)
GFR calc non Af Amer: >60 mL/min (ref >60)
Glucose, Bld: 179 mg/dL — ABNORMAL HIGH (ref 70–99)
Potassium: 3.7 mmol/L (ref 3.5–5.1)
Sodium: 140 mmol/L (ref 135–145)
Total Bilirubin: 0.6 mg/dL (ref 0.3–1.2)
Total Protein: 6.6 g/dL (ref 6.5–8.1)

## 2019-04-04 LAB — TROPONIN I (HIGH SENSITIVITY)
Troponin I (High Sensitivity): 8 ng/L (ref <18)
Troponin I (High Sensitivity): 7 ng/L (ref <18)

## 2019-04-04 MED ORDER — ASPIRIN 81 MG PO CHEW
324.0000 mg | CHEWABLE_TABLET | Freq: Once | ORAL | Status: AC
  Administered 2019-04-04: 324 mg via ORAL
  Filled 2019-04-04: qty 4

## 2019-04-04 NOTE — Telephone Encounter (Signed)
Spoke to pt about chest pain radiating to left arm, both sides of the jaw, headache and stomach ache. She took 325mg  Aspirin and was too scared to call 911. Asked the pt what her current BP was: 144/83. She said the pain comes and goes and when she has the pain it's 10/10. Pt sounded in distress and hysterical. Tried to calm pt down and told her to call 911. Pt verbalized understanding.

## 2019-04-04 NOTE — Discharge Instructions (Addendum)
You were seen in the emergency department today for chest pain. Your work-up in the emergency department has been overall reassuring. Your labs have been fairly normal and or similar to previous blood work you have had done, your blood sugar was somewhat elevated- please have this rechecked by primary care within 1-2 weeks. Your EKG and the enzyme we use to check your heart did not show an acute heart attack at this time. Your chest x-ray was normal.   We would like you to follow up closely with your primary care provider and/or the cardiologist provided in your discharge instructions within 1-3 days as we feel you would benefit from repeat stress testing.  Please continue your daily 81 mg Aspirin. Return to the ER immediately should you experience any new or worsening symptoms including but not limited to return of pain, worsened pain, vomiting, shortness of breath, dizziness, lightheadedness, passing out, or any other concerns that you may have.

## 2019-04-04 NOTE — ED Provider Notes (Signed)
[facility] EMERGENCY DEPARTMENT Provider Note   CSN: [id number] Arrival date & time: 04/04/19  1547     History   Chief Complaint Chief Complaint  Patient presents with   Chest Pain    HPI Jade Singh is a 68 y.o. female with a hx of HTN, hyperlipidemia, anxiety, depression, & GERD who presents to the ED w/ complaints of chest pain that began last night. Patient states she was @ rest when she developed fairly quick onset central chest pain described as a heaviness that radiated to the back and radiated up to the jaw with an aching sensation to this location associated with diaphoresis, stomach upset, and dyspnea.. She states that she took 324 mg of aspirin and was able to fall asleep after some improvement of the pain.  She states this morning she woke up with more of a chest soreness that has been coming and going today.  She notes that the pain is lasting for about 30 seconds to a few minutes at a time, she states this may be worse when she is walking around but not every time, no other alleviating or aggravating factors, not worse with a deep breath.  She states that with the chest discomfort she is also having some mild shortness of breath as well as headaches (gradual onset, steady progression, similar to prior headaches).  Diaphoresis is only reoccurred with one episode of pain.  Abdominal discomfort is resolved today. Patient is currently asymptomatic.  Patient denies fever, chills, cough, hemoptysis, unilateral leg pain/swelling, recent surgery/trauma, recent long travel, hormone use, personal hx of cancer, or hx of DVT/PE.  Denies nausea, vomiting, or syncope.  Nuys numbness, tingling, weakness, or visual disturbance.  Per chart review patient had cardiac cath in 2005 that was clean.  She also had an exercise stress test performed 06/15/2016 that showed no ST segment deviation or arrhythmias, no significant symptoms during stress test.  HPI  Past Medical History:  Diagnosis  Date   ANAL FISSURE, HX OF 11/06/2007   Qualifier: Diagnosis of  By: Misty Stanley CMA (AAMA), Amanda     Anxiety    Depression    Difficulty sleeping    takes Ambien   Diverticulosis 5/1/5   DJD (degenerative joint disease)    Family history of anesthesia complication    "mother quit breathing" 30 yrs ago pt does not know any more details. 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   Unspecified vitamin D deficiency    Urgency of urination     Patient Active Problem List   Diagnosis Date Noted   Normal coronary arteries 05/04/2017   Diverticulosis    Other abnormal glucose 06/25/2014   Vitamin D deficiency    Hypertension    Hypercholesterolemia    GERD (gastroesophageal reflux disease)    Morbid obesity (HCC)    Anxiety state 11/06/2007   Depression 11/06/2007   IBS 11/06/2007    Past Surgical History:  Procedure Laterality Date   ABDOMINAL HYSTERECTOMY     partial   APPENDECTOMY     CARDIAC CATHETERIZATION  2005    no problems per pt    KNEE ARTHROSCOPY     RT 2011, LT 2009   TONSILLECTOMY AND ADENOIDECTOMY  TOTAL KNEE ARTHROPLASTY Right 02/28/2014   Procedure: RIGHT TOTAL KNEE ARTHROPLASTY;  Surgeon: Johnn Hai, MD;  Location: [facility];  Service: Orthopedics;  Laterality: Right;   TUBAL LIGATION       OB History   No obstetric history on file.      Home Medications    Prior to Admission medications   Medication Sig Start Date End Date Taking? Authorizing Provider  aspirin 81 MG tablet Take 81 mg by mouth daily.    [provider]  cetirizine (ZYRTEC) 10 MG tablet Take 10 mg by mouth daily.    [provider]  Cholecalciferol (VITAMIN D3) 5000 UNITS CAPS Take 5,000 Units by mouth 4 (four) times a week. Takes M,W,TH,F     [provider]  diazepam (VALIUM) 10 MG tablet Take 1/2-1 tablet at Bedtime ONLY if needed for Sleep &  limit to 5 days /week to avoid addiction 02/02/19   Unk Pinto, MD  pantoprazole (PROTONIX) 40 MG tablet TAKE 1 TABLET BY MOUTH EVERY DAY 11/30/18   Liane Comber, NP  rosuvastatin (CRESTOR) 40 MG tablet Take 1 tablet daily for Cholesterol 10/28/18   Unk Pinto, MD  triamterene-hydrochlorothiazide (MAXZIDE-25) 37.5-25 MG tablet Take 1 tablet by mouth at bedtime. Keep appointment for future refills 03/30/19   Croitoru, Mihai, MD  zolpidem (AMBIEN) 5 MG tablet TAKE 1/2 TO 1 TABLET BY MOUTH AT BEDTIME ONE HOUR BEFORE SLEEP 03/07/19   Liane Comber, NP    Family History Family History  Problem Relation Age of Onset   Stroke Mother    Hyperlipidemia Mother    Heart attack Mother    Cancer Brother 17       prostate, deceased   Heart disease Paternal Grandfather    Heart attack Maternal Grandfather    Diabetes Paternal Uncle    Colon cancer Neg Hx    Breast cancer Neg Hx     Social History Social History   Tobacco Use   Smoking status: Former Smoker    Quit date: 07/19/2000    Years since quitting: 18.7   Smokeless tobacco: Never Used  Substance Use Topics   Alcohol use: Yes    Comment: occ   Drug use: No     Allergies   Doxycycline, Levaquin [levofloxacin in d5w], and Oruvail [ketoprofen]   Review of Systems Review of Systems  Constitutional: Positive for diaphoresis. Negative for chills and fever.  Eyes: Negative for visual disturbance.  Respiratory: Positive for shortness of breath. Negative for cough.        Negative for hemoptysis.   Cardiovascular: Positive for chest pain.  Gastrointestinal: Positive for abdominal pain. Negative for blood in stool, constipation, diarrhea, nausea and vomiting.  Genitourinary: Negative for dysuria.  Musculoskeletal: Negative for myalgias.  Neurological: Positive for headaches. Negative for dizziness,  seizures, syncope, speech difficulty, weakness, light-headedness and numbness.  All other systems reviewed and are negative.    Physical Exam Updated Vital Signs BP (!) 153/82    Pulse 84    Temp 98.5 F (36.9 C) (Oral)    Resp 20    Ht 5\' 5"  (1.651 m)    Wt 96.2 kg    SpO2 100%    BMI 35.28 kg/m   Physical Exam Vitals signs and nursing note reviewed.  Constitutional:      General: She is not in acute distress.    Appearance: She is well-developed. She is not toxic-appearing.  HENT:     Head: Normocephalic and atraumatic.  Eyes:     General:        Right eye: No discharge.        Left eye: No discharge.     Extraocular Movements: Extraocular movements intact.     Conjunctiva/sclera: Conjunctivae normal.     Pupils: Pupils are equal, round, and reactive to light.  Neck:     Musculoskeletal: Normal range of motion and neck supple.     Vascular: No JVD.  Cardiovascular:     Rate and Rhythm: Normal rate and regular rhythm.     Pulses:          Radial pulses are 2+ on the right side and 2+ on the left side.       Dorsalis pedis pulses are 2+ on the right side and 2+ on the left side.     Heart sounds: No murmur.  Pulmonary:     Effort: Pulmonary effort is normal. No respiratory distress.     Breath sounds: Normal breath sounds. No decreased breath sounds, wheezing, rhonchi or rales.  Abdominal:     General: There is no distension.     Palpations: Abdomen is soft.     Tenderness: There is no abdominal tenderness.  Musculoskeletal:     Right lower leg: She exhibits no tenderness. No edema.     Left lower leg: She exhibits no tenderness. No edema.  Skin:    General: Skin is warm and dry.     Findings: No rash.  Neurological:     Mental Status: She is alert.     Comments: Alert. Clear speech. No facial droop. CNIII-XII grossly intact. Bilateral upper and lower extremities' sensation grossly intact. 5/5 symmetric strength with grip strength and with plantar and dorsi flexion  bilaterally.. Normal finger to nose bilaterally. Negative pronator drift. . Gait is steady and intact,   Psychiatric:        Behavior: Behavior normal.    ED Treatments / Results  Labs (all labs ordered are listed, but only abnormal results are displayed) Labs Reviewed - No data to display  EKG EKG Interpretation  Date/Time:  Wednesday April 04 2019 16:04:23 EDT Ventricular Rate:  91 PR Interval:    QRS Duration: 94 QT Interval:  356 QTC Calculation: 438 R Axis:   75 Text Interpretation:  Sinus rhythm Confirmed by Bethann BerkshireZammit, Joseph [phone]([id number]) on 04/04/2019 5:00:34 PM   Radiology No results found.  Procedures Procedures (including critical care time)  Medications Ordered in ED Medications  aspirin chewable tablet 324 mg (has no administration in time range)     Initial Impression / Assessment and Plan / ED Course  I have reviewed the triage vital signs and the nursing notes.  Pertinent labs & imaging results that were available during my care of the patient were reviewed by me and considered in my medical decision making (see chart for details).   Patient presents to the emergency department with complaints of chest pain that began last evening and has been occurring intermittently throughout the day today, associated symptoms at times have included diaphoresis, dyspnea, and headache.  Patient is nontoxic-appearing, no apparent distress, vitals WNL with exception of elevated blood pressure which has been improving since she arrived.  Her physical exam is overall benign.  Heart regular rate and rhythm, lungs clear to auscultation bilaterally, no peripheral edema.  Symmetric pulses throughout.   DDX: ACS, PE, dissection, pneumothorax, pneumonia, anxiety, MSK, anemia.  Work-up initiated.  Will give patient 324 of ASA as she has  not had this today. She also mentions HA- no red flags, normal neuro exam.   EKG per triage has been reviewed, normal sinus rhythm, no STEMI or significant  ischemic changes.  Work-up in the ER reviewed:  CBC: No anemia or leukocytosis.  CMP: Hyperglycemia w/o anion gap elevation or acidosis. No significant electrolyte derangement.  Troponin: 8, 7 CXR:  Negative, without infiltrate, effusion, pneumothorax, or fracture/dislocation.   Patient is low risk wells,  doubt pulmonary embolism. Pain is not a tearing sensation, symmetric pulses, no widening of mediastinum on CXR, doubt dissection. Cardiac monitor reviewed, no notable arrhythmias or tachycardia. Heart pathway score of 6 EKG without obvious ischemia, delta troponin without significant elevation or change-->  discussed observation vs. Discharge as well as possible cardiology consult w/ supervising physician Dr. Estell HarpinZammit who has evaluated the patient- recommends discharge home with continuing of 81 mg of ASA daily & close follow up with her cardiologist outpatient for repeat stress testing. Plan carried out as discussed. On re-assessment patient states she has remained chest pain free while in the ED, she feels comfortable going home. Patient has appeared hemodynamically stable throughout ER visit and appears safe for discharge with close cardiology follow up. I discussed results, treatment plan, need for follow-up, and return precautions with the patient & her husband @ bedside. Provided opportunity for questions, patient & her husband confirmed understanding and are in agreement with plan.    Final Clinical Impressions(s) / ED Diagnoses   Final diagnoses:  Chest pain, unspecified type    ED Discharge Orders    None       Cherly Andersonetrucelli, Vesna Kable R, PA-C 04/04/19 1948    Bethann BerkshireZammit, Joseph, MD 04/05/19 1157

## 2019-04-04 NOTE — ED Triage Notes (Signed)
Pt states that she had chest pain last night that went to her back and up her jaw.

## 2019-04-04 NOTE — Telephone Encounter (Signed)
Pt c/o of Chest Pain: STAT if CP now or developed within 24 hours  1. Are you having CP right now? Still has the pain, but not as bad as last night.   2. Are you experiencing any other symptoms (ex. SOB, nausea, vomiting, sweating)? Chest pain, left arm, went up to jaw, bad headache, stomach hurt. Went on for about an hour. Did take a 325mg  asprin.   3. How long have you been experiencing CP? Staring last night   4. Is your CP continuous or coming and going? Comes and goes   5. Have you taken Nitroglycerin? No  ?

## 2019-04-05 ENCOUNTER — Telehealth: Payer: Self-pay | Admitting: Cardiovascular Disease

## 2019-04-05 DIAGNOSIS — Z0389 Encounter for observation for other suspected diseases and conditions ruled out: Secondary | ICD-10-CM

## 2019-04-05 DIAGNOSIS — R079 Chest pain, unspecified: Secondary | ICD-10-CM

## 2019-04-05 DIAGNOSIS — R072 Precordial pain: Secondary | ICD-10-CM

## 2019-04-05 MED ORDER — METOPROLOL TARTRATE 50 MG PO TABS: ORAL_TABLET | ORAL | 0 refills | Status: DC

## 2019-04-05 NOTE — Telephone Encounter (Signed)
Called patient back- she states she was recently seen in ED, they advised her it would be a good idea to have another stress test completed, advised I would have to route to MD to advise of discharge instructions and information from hospital visit and we would let her know when and if it was ordered so we could get it scheduled.  Patient verbalized understanding.

## 2019-04-05 NOTE — Telephone Encounter (Signed)
Patient calling back for lab results that were done at [facility].

## 2019-04-05 NOTE — Telephone Encounter (Signed)
CTA was ordered- instructions sent via mychart after patient verbalized she had access.  Metoprolol 100 mg ordered (pt pulse at hospital yesterday was 46)  Lab work ordered, patient advised that it would go through insurance, and then be scheduled by hospital, and to have blood work 1 week before.

## 2019-04-05 NOTE — Telephone Encounter (Signed)
Please tell her that I reviewed the notes, labs and ECG from her ED visit. Please schedule her for a coronary CT angiogram (morph), which will be more informative than a stress test (and will also avoid the need for COVID testing). Re: chest pain, suspected CAD

## 2019-04-20 ENCOUNTER — Telehealth (HOSPITAL_COMMUNITY): Payer: Self-pay | Admitting: Emergency Medicine

## 2019-04-20 NOTE — Telephone Encounter (Signed)
Reaching out to patient to offer assistance regarding upcoming cardiac imaging study; pt verbalizes understanding of appt date/time, parking situation and where to check in, pre-test NPO status and medications ordered, and verified current allergies; name and call back number provided for further questions should they arise Darnice Comrie RN Navigator Cardiac Imaging [facility] [phone] office [phone] cell 

## 2019-04-23 ENCOUNTER — Other Ambulatory Visit (HOSPITAL_COMMUNITY): Payer: Medicare Other

## 2019-04-23 ENCOUNTER — Ambulatory Visit (HOSPITAL_COMMUNITY)
Admission: RE | Admit: 2019-04-23 | Discharge: 2019-04-23 | Disposition: A | Discharge status: Home / Self Care | Payer: Medicare Other | Source: Ambulatory Visit | Attending: Cardiovascular Disease | Admitting: Cardiovascular Disease

## 2019-04-23 ENCOUNTER — Other Ambulatory Visit: Payer: Self-pay

## 2019-04-23 ENCOUNTER — Encounter (HOSPITAL_COMMUNITY): Payer: Self-pay

## 2019-04-23 DIAGNOSIS — R072 Precordial pain: Secondary | ICD-10-CM

## 2019-04-23 DIAGNOSIS — Z0389 Encounter for observation for other suspected diseases and conditions ruled out: Secondary | ICD-10-CM | POA: Insufficient documentation

## 2019-04-23 IMAGING — CT CT HEART MORP W/ CTA COR W/ SCORE W/ CA W/CM &/OR W/O CM
1 of 9 series · 8 of 20 positions shown, 10 images · non-contrast
Comparison: CT 12/28/2011
COMPARISON: CT 12/28/2011

Addendum:
EXAM:
OVER-READ INTERPRETATION  CT CHEST

The following report is an over-read performed by radiologist Dr.
Ghervas Marchenco [REDACTED] on 04/23/2019. This
over-read does not include interpretation of cardiac or coronary
anatomy or pathology. The coronary CTA interpretation by the
cardiologist is attached.
CLINICAL DATA: Chest pain
Cardiac CTA
MEDICATIONS:
Sub lingual nitro. 4 mg and lopressor 5mg
TECHNIQUE: The patient was scanned on a Siemens Force 192 scanner. Gantry
rotation speed was 250 msecs. Collimation was. 6 mm . A 120 kV
prospective scan was triggered in the ascending thoracic aorta at
140 HU's with full mA between 30-70% of the R-R interval . Average
HR during the scan was 50 bpm. The 3D data set was interpreted on a
dedicated work station using MPR, MIP and VRT modes. A total of 80
cc of contrast was used.

[Series 10: multiphase · axial · 0.39mm/px · z∈[-349,-247]mm · 8 of 1971 slices shown, 10 images]
[im 219/1971  vessel]
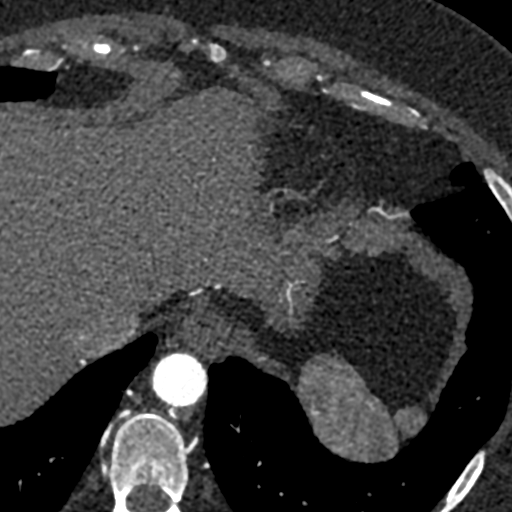
[im 219/1971  lung]
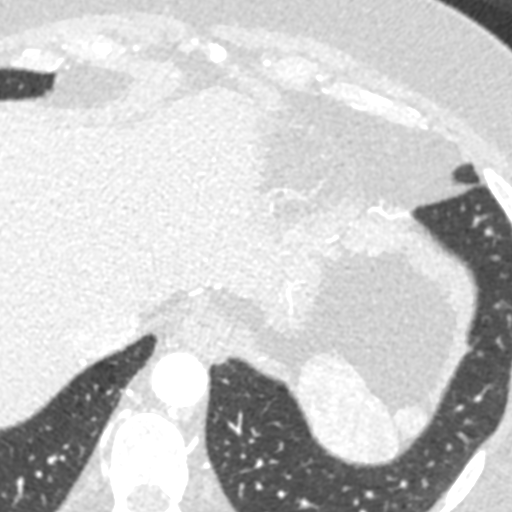
[im 438/1971  vessel]
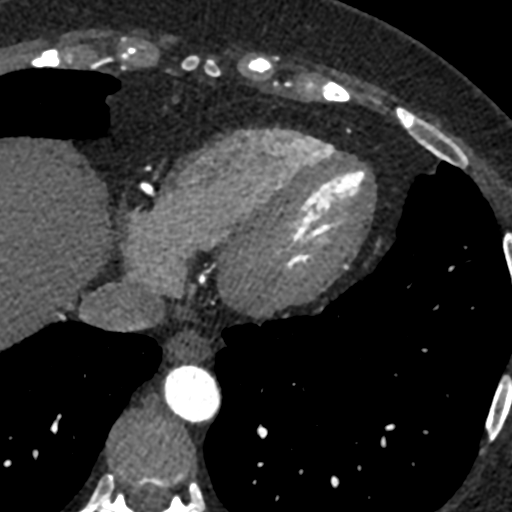
[im 657/1971  vessel]
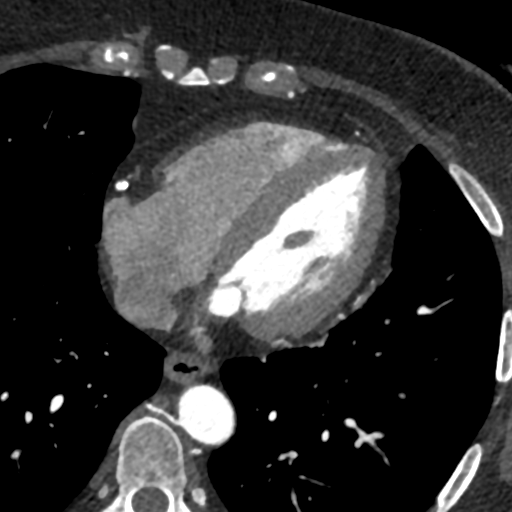
[im 876/1971  vessel]
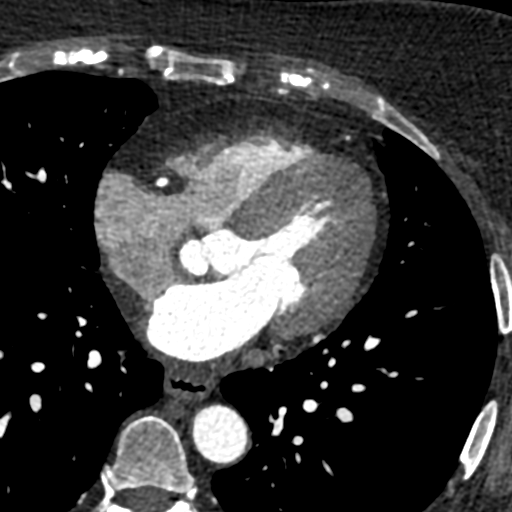
[im 1095/1971  vessel]
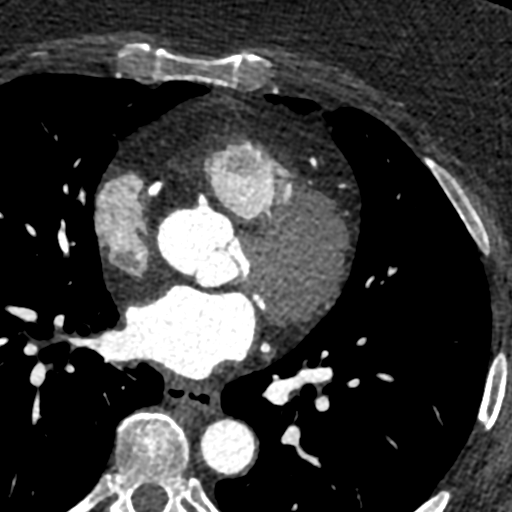
[im 1095/1971  lung]
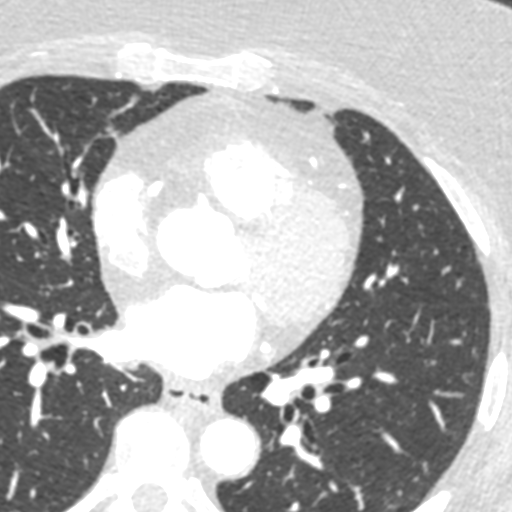
[im 1314/1971  vessel]
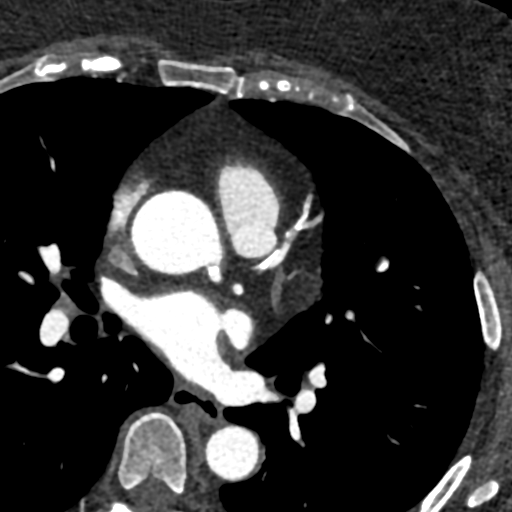
[im 1533/1971  vessel]
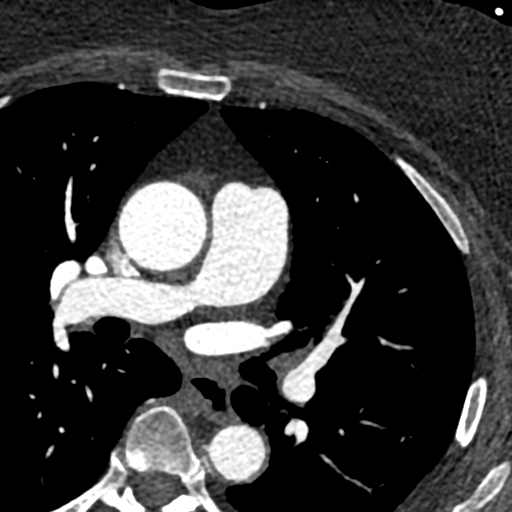
[im 1752/1971  vessel]
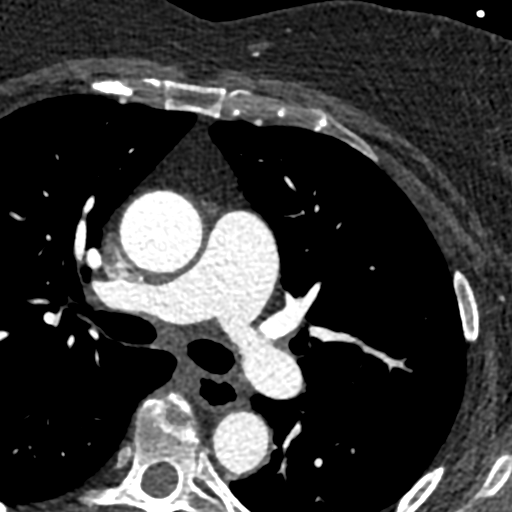

[8 of 20 positions shown; findings below may reference images not displayed]

FINDINGS: Limited view of the lung parenchyma demonstrates no suspicious
nodularity. Airways are normal.

Limited view of the mediastinum demonstrates no adenopathy.
Esophagus normal.

Limited view of the upper abdomen unremarkable.

Limited view of the skeleton and chest wall is unremarkable.
IMPRESSION: No significant extracardiac findings.
FINDINGS: Non-cardiac: See separate report from [REDACTED]. No
significant findings on limited lung and soft tissue windows.

Calcium score: Dense calcium noted in LM and all 3 major coronary
vessels

Coronary Arteries: Right dominant with no anomalies

LM: 50% calcific plaque extends into the ostium of the LAD

LAD: 50-69% ostial LAD 1-24% calcific plaque in proximal and mid
vessel

D1: 25-49% calcific plaque

D2: Normal

IM: Small vessel normal

Circumflex: 40-59% proximal calcific plaque 1-24% calcific mid
vessel plaque

OM1: Normal

OM2: Normal

RCA: 25-49% calcific plaque in proximal and mid vessel

PDA: Normal

PLA: Normal
IMPRESSION: 1. Calcium score 598 involving LM and all 3 major epicardial vessels
this is 96 th percentile for age and sex

2. Concern for LM and ostial LAD obstructive disease study sent for
FFR CT

3.  Normal aortic root 3.6 cm

Imre Tibor Buzer

*** End of Addendum ***
EXAM:
OVER-READ INTERPRETATION  CT CHEST

The following report is an over-read performed by radiologist Dr.
Ghervas Marchenco [REDACTED] on 04/23/2019. This
over-read does not include interpretation of cardiac or coronary
anatomy or pathology. The coronary CTA interpretation by the
cardiologist is attached.
FINDINGS: Limited view of the lung parenchyma demonstrates no suspicious
nodularity. Airways are normal.

Limited view of the mediastinum demonstrates no adenopathy.
Esophagus normal.

Limited view of the upper abdomen unremarkable.

Limited view of the skeleton and chest wall is unremarkable.
IMPRESSION: No significant extracardiac findings.

## 2019-04-23 MED ORDER — IOHEXOL 350 MG/ML SOLN
80.0000 mL | Freq: Once | INTRAVENOUS | Status: AC | PRN
  Administered 2019-04-23: 80 mL via INTRAVENOUS

## 2019-04-23 MED ORDER — NITROGLYCERIN 0.4 MG SL SUBL
0.8000 mg | SUBLINGUAL_TABLET | Freq: Once | SUBLINGUAL | Status: AC
  Administered 2019-04-23: 0.8 mg via SUBLINGUAL
  Filled 2019-04-23: qty 25

## 2019-04-23 MED ORDER — NITROGLYCERIN 0.4 MG SL SUBL
SUBLINGUAL_TABLET | SUBLINGUAL | Status: AC
  Filled 2019-04-23: qty 2

## 2019-04-24 ENCOUNTER — Other Ambulatory Visit: Payer: Self-pay | Admitting: Cardiovascular Disease

## 2019-04-25 DIAGNOSIS — R072 Precordial pain: Secondary | ICD-10-CM | POA: Diagnosis not present

## 2019-04-26 ENCOUNTER — Telehealth: Payer: Self-pay | Admitting: Cardiovascular Disease

## 2019-04-26 NOTE — Telephone Encounter (Signed)
Called patient, she was on the phone with Lattie Haw, Therapist, sports.  I advised her to speak to her as she was Dr.Croitorus nurse.  Patient verbalized understanding.

## 2019-04-26 NOTE — Telephone Encounter (Signed)
New Message  Patient is calling in for her results. Please give patient a call.

## 2019-05-05 ENCOUNTER — Other Ambulatory Visit: Payer: Self-pay | Admitting: Adult Health

## 2019-05-05 DIAGNOSIS — K219 Gastro-esophageal reflux disease without esophagitis: Secondary | ICD-10-CM

## 2019-05-08 ENCOUNTER — Other Ambulatory Visit (HOSPITAL_COMMUNITY)
Admission: RE | Admit: 2019-05-08 | Discharge: 2019-05-08 | Disposition: A | Discharge status: Home / Self Care | Payer: Medicare Other | Source: Ambulatory Visit | Attending: Cardiovascular Disease | Admitting: Cardiovascular Disease

## 2019-05-08 ENCOUNTER — Ambulatory Visit (INDEPENDENT_AMBULATORY_CARE_PROVIDER_SITE_OTHER): Payer: Medicare Other | Admitting: Cardiovascular Disease

## 2019-05-08 ENCOUNTER — Other Ambulatory Visit: Payer: Self-pay

## 2019-05-08 ENCOUNTER — Encounter: Payer: Self-pay | Admitting: Cardiovascular Disease

## 2019-05-08 ENCOUNTER — Other Ambulatory Visit: Payer: Self-pay | Admitting: *Deleted

## 2019-05-08 VITALS — BP 136/76 | HR 78 | Ht 65.0 in | Wt 222.6 lb

## 2019-05-08 DIAGNOSIS — Z01812 Encounter for preprocedural laboratory examination: Secondary | ICD-10-CM | POA: Insufficient documentation

## 2019-05-08 DIAGNOSIS — K219 Gastro-esophageal reflux disease without esophagitis: Secondary | ICD-10-CM

## 2019-05-08 DIAGNOSIS — I25118 Atherosclerotic heart disease of native coronary artery with other forms of angina pectoris: Secondary | ICD-10-CM | POA: Diagnosis not present

## 2019-05-08 DIAGNOSIS — E7800 Pure hypercholesterolemia, unspecified: Secondary | ICD-10-CM | POA: Diagnosis not present

## 2019-05-08 DIAGNOSIS — I10 Essential (primary) hypertension: Secondary | ICD-10-CM

## 2019-05-08 DIAGNOSIS — Z01818 Encounter for other preprocedural examination: Secondary | ICD-10-CM

## 2019-05-08 DIAGNOSIS — R079 Chest pain, unspecified: Secondary | ICD-10-CM | POA: Diagnosis not present

## 2019-05-08 DIAGNOSIS — Z20828 Contact with and (suspected) exposure to other viral communicable diseases: Secondary | ICD-10-CM | POA: Diagnosis not present

## 2019-05-08 DIAGNOSIS — Z6837 Body mass index (BMI) 37.0-37.9, adult: Secondary | ICD-10-CM | POA: Diagnosis not present

## 2019-05-08 LAB — BASIC METABOLIC PANEL
BUN/Creatinine Ratio: 35 — ABNORMAL HIGH (ref 12–28)
BUN: 28 mg/dL — ABNORMAL HIGH (ref 8–27)
CO2: 24 mmol/L (ref 20–29)
Calcium: 8.9 mg/dL (ref 8.7–10.3)
Chloride: 106 mmol/L (ref 96–106)
Creatinine, Ser: 0.8 mg/dL (ref 0.57–1.00)
GFR calc Af Amer: 89 mL/min/{1.73_m2} (ref >59)
GFR calc non Af Amer: 77 mL/min/{1.73_m2} (ref >59)
Glucose: 94 mg/dL (ref 65–99)
Potassium: 4.4 mmol/L (ref 3.5–5.2)
Sodium: 143 mmol/L (ref 134–144)

## 2019-05-08 LAB — CBC
Hematocrit: 41.5 % (ref 34.0–46.6)
Hemoglobin: 13.6 g/dL (ref 11.1–15.9)
MCH: 28.8 pg (ref 26.6–33.0)
MCHC: 32.8 g/dL (ref 31.5–35.7)
MCV: 88 fL (ref 79–97)
Platelets: 199 10*3/uL (ref 150–450)
RBC: 4.72 x10E6/uL (ref 3.77–5.28)
RDW: 12.7 % (ref 11.7–15.4)
WBC: 6.9 10*3/uL (ref 3.4–10.8)

## 2019-05-08 MED ORDER — NITROGLYCERIN 0.4 MG SL SUBL: 0.4000 mg | SUBLINGUAL_TABLET | SUBLINGUAL | 1 refills | Status: DC | PRN

## 2019-05-08 MED ORDER — SODIUM CHLORIDE 0.9% FLUSH: 3.0000 mL | Freq: Two times a day (BID) | INTRAVENOUS | Status: DC

## 2019-05-08 NOTE — Patient Instructions (Signed)
Medication Instructions:  Nitroglycerin has been sent in for you to your pharmacy. Place one tablet under the tongue every 5 minutes as needed for chest pain. Do not take more than 3 tablets. If you need 3, please call 911.    *If you need a refill on your cardiac medications before your next appointment, please call your pharmacy*  Lab Work: Your provider would like for you to have the following labs today: CBC and BMET  If you have labs (blood work) drawn today and your tests are completely normal, you will receive your results only by: Marland Kitchen MyChart Message (if you have MyChart) OR . A paper copy in the mail If you have any lab test that is abnormal or we need to change your treatment, we will call you to review the results.  You will need to have the coronavirus test completed prior to your procedure. An appointment has been made at 11:40 am  on 05/08/2019. This is a Drive Up Visit at the [facility] [address] Someone will direct you to the appropriate testing line. Please tell them that you are there for procedure testing. Stay in your car and someone will be with you shortly. Please make sure to have all other labs completed before this test because you will need to stay quarantined until your procedure.   Testing/Procedures: Your physician has requested that you have a cardiac catheterization. Cardiac catheterization is used to diagnose and/or treat various heart conditions. Doctors may recommend this procedure for a number of different reasons. The most common reason is to evaluate chest pain. Chest pain can be a symptom of coronary artery disease (CAD), and cardiac catheterization can show whether plaque is narrowing or blocking your heart's arteries. This procedure is also used to evaluate the valves, as well as measure the blood flow and oxygen levels in different parts of your heart. For further information please visit [email or web address]. Please follow instruction  sheet, as given.   Follow-Up: At [facility], you and your health needs are our priority.  As part of our continuing mission to provide you with exceptional heart care, we have created designated Provider Care Teams.  These Care Teams include your primary Cardiologist (physician) and Advanced Practice Providers (APPs -  Physician Assistants and Nurse Practitioners) who all work together to provide you with the care you need, when you need it.  Your next appointment:   2-4 weeks following the cardiac cath  The format for your next appointment:   Either In Person or Virtual  Provider:   Sanda Klein, MD  Other Instructions    [address] [place] [address] Dept: [phone] Loc: [place]  05/08/2019  You are scheduled for a Cardiac Catheterization on Friday, October 23 with Dr. Kathlyn [place].  1. Please arrive at the [facility] (Main Entrance A) at [facility]: [address] [place], [place] [address] at 7:00 AM (This time is two hours before your procedure to ensure your preparation). Free valet parking service is available.   Special note: Every effort is made to have your procedure done on time. Please understand that emergencies sometimes delay scheduled procedures.  2. Diet: Do not eat solid foods after midnight.  The patient may have clear liquids until 5am upon the day of the procedure.  3. Labs:  You will need to have the coronavirus test completed prior to  your procedure. An appointment has been made at 11:40 am on 05/08/2019. This is a Drive Up Visit at the [facility] [address]. Someone will direct you to the appropriate testing line. Please tell them that you are there for procedure testing. Stay in your car and someone will be with you shortly. Please make sure to have all other labs completed before this  test because you will need to stay quarantined until your procedure.  4. Medication instructions in preparation for your procedure: Hold the Maxzide (triamterene-hydrochlorothiazide) the morning of the procedure.  On the morning of your procedure, take your Aspirin and any morning medicines NOT listed above.  You may use sips of water.  5. Plan for one night stay--bring personal belongings. 6. Bring a current list of your medications and current insurance cards. 7. You MUST have a responsible person to drive you home. 8. Someone MUST be with you the first 24 hours after you arrive home or your discharge will be delayed. 9. Please wear clothes that are easy to get on and off and wear slip-on shoes.  Thank you for allowing [place] to care for you!   -- [place] [facility]

## 2019-05-08 NOTE — H&P (View-Only) (Signed)
Cardiology Office Note    Date:  05/09/2019   ID:  Jade Singh, DOB 03/19/1951, MRN [id number]  PCP:  [name], MD  Cardiologist:   Thurmon Fair, MD   Chief Complaint  Patient presents with  . Coronary Artery Disease    Follow-up coronary CT angiogram    History of Present Illness:  Jade Singh is a 68 y.o. female with hypertension, hyperlipidemia, obesity and a strong family history of coronary artery disease. She had a extensive evaluation in 2013 when both her echocardiogram and her stress test were normal. She managed to lose 40 pounds in 2 years using Weight [employer] but has gained roughly half of that weight back over the last couple of years.   She had another episode of severe chest discomfort, similar to the one that occurred in [place] in the past.  Stress has a lot to do with it, since she is caring for 2 elderly relatives.  We reviewed her coronary CT angiogram.  She has a very high calcium score of 598 (96th percentile).  She had several moderate stenoses including 50% in the left main, 50-69% in the proximal LAD, 40-59% in the left circumflex.  However by fractional flow reserve the only significant stenosis was in the distal LAD (FFR 0.75).  She does develop some chest tightness with physical activity, although this is inconsistent.  She is very concerned about the possibility of of a future heart attack.  Some of her symptoms occur randomly at rest, with chest tightness that radiates up into her throat and suggest possible coronary vasospasm.  Past Medical History:  Diagnosis Date  . ANAL FISSURE, HX OF 11/06/2007   Qualifier: Diagnosis of  By: Misty Stanley CMA (AAMA), Marchelle Folks    . Anxiety   . Depression   . Difficulty sleeping    takes Ambien  . Diverticulosis 5/1/5  . DJD (degenerative joint disease)   . Family history of anesthesia complication    "mother quit breathing" 30 yrs ago pt does not know any more details. Pt has never had any  problems with anesthesia herself  . GERD (gastroesophageal reflux disease)   . Hyperlipidemia   . Hypertension   . Obese   . Peripheral edema   . Psoriasis    HANDS  . Psoriasis   . 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 Unspecified vitamin D deficiency   . Urgency of urination     Past Surgical History:  Procedure Laterality Date  . ABDOMINAL HYSTERECTOMY     partial  . APPENDECTOMY    . CARDIAC CATHETERIZATION  2005    no problems per pt   . KNEE ARTHROSCOPY     RT 2011, LT 2009  . TONSILLECTOMY AND ADENOIDECTOMY    . TOTAL KNEE ARTHROPLASTY Right 02/28/2014   Procedure: RIGHT TOTAL KNEE ARTHROPLASTY;  Surgeon: Javier Docker, MD;  Location: [facility];  Service: Orthopedics;  Laterality: Right;  . TUBAL LIGATION      Current Medications: Outpatient Medications Prior to Visit  Medication Sig Dispense Refill  . aspirin 81 MG tablet Take 81 mg by mouth daily.    . cetirizine (ZYRTEC) 10 MG tablet Take 10 mg by mouth daily.    . Cholecalciferol (VITAMIN D3) 5000 UNITS CAPS Take 5,000 Units by mouth 4 (four) times a week. Takes M,W,TH,F    . diazepam (VALIUM) 10 MG tablet Take 1/2-1 tablet at Bedtime ONLY if needed for Sleep &  limit to 5 days /week to  avoid addiction (Patient taking differently: Take 5-10 mg by mouth daily as needed for anxiety or sleep. ) 90 tablet 0  . pantoprazole (PROTONIX) 40 MG tablet Take 1 tablet Daily to Prevent Indigestion & Heartburn (Patient taking differently: Take 40 mg by mouth daily. Prevent Indigestion & Heartburn) 90 tablet 1  . rosuvastatin (CRESTOR) 40 MG tablet Take 1 tablet daily for Cholesterol (Patient taking differently: Take 20 mg by mouth See admin instructions. Take four times a week) 90 tablet 3  . triamterene-hydrochlorothiazide (MAXZIDE-25) 37.5-25 MG tablet TAKE 1 TABLET BY MOUTH AT BEDTIME. KEEP APPOINTMENT FOR FUTURE REFILLS (Patient taking differently: Take 1 tablet by mouth daily. ) 30 tablet 0  . zolpidem (AMBIEN) 5 MG tablet TAKE 1/2 TO 1  TABLET BY MOUTH AT BEDTIME ONE HOUR BEFORE SLEEP (Patient taking differently: Take 5 mg by mouth at bedtime. ) 30 tablet 2  . metoprolol tartrate (LOPRESSOR) 50 MG tablet Take 2 tablet (100mg ) by mouth once for procedure. 2 tablet 0   No facility-administered medications prior to visit.      Allergies:   Doxycycline, Levaquin [levofloxacin in d5w], and Oruvail [ketoprofen]   Social History   Socioeconomic History  . Marital status: Married    Spouse name: Not on file  . Number of children: Not on file  . Years of education: Not on file  . Highest education level: Not on file  Occupational History  . Not on file  Social Needs  . Financial resource strain: Not on file  . Food insecurity    Worry: Not on file    Inability: Not on file  . Transportation needs    Medical: Not on file    Non-medical: Not on file  Tobacco Use  . Smoking status: Former Smoker    Quit date: 07/19/2000    Years since quitting: 18.8  . Smokeless tobacco: Never Used  Substance and Sexual Activity  . Alcohol use: Yes    Comment: occ  . Drug use: No  . Sexual activity: Not on file  Lifestyle  . Physical activity    Days per week: Not on file    Minutes per session: Not on file  . Stress: Not on file  Relationships  . Social 09/16/2000 on phone: Not on file    Gets together: Not on file    Attends religious service: Not on file    Active member of club or organization: Not on file    Attends meetings of clubs or organizations: Not on file    Relationship status: Not on file  Other Topics Concern  . Not on file  Social History Narrative  . Not on file     Family History:  The patient's family history includes Cancer (age of onset: 65) in her brother; Diabetes in her paternal uncle; Heart attack in her maternal grandfather and mother; Heart disease in her paternal grandfather; Hyperlipidemia in her mother; Stroke in her mother.   ROS:   Please see the history of present illness.     ROS All other systems reviewed and are negative.   PHYSICAL EXAM:   VS:  BP 136/76   Pulse 78   Ht 5\' 5"  (1.651 m)   Wt 222 lb 9.6 oz (101 kg)   SpO2 97%   BMI 37.04 kg/m     General: Alert, oriented x3, no distress, audibly obese Head: no evidence of trauma, PERRL, EOMI, no exophtalmos or lid lag, no myxedema, no  xanthelasma; normal ears, nose and oropharynx Neck: normal jugular venous pulsations and no hepatojugular reflux; brisk carotid pulses without delay and no carotid bruits Chest: clear to auscultation, no signs of consolidation by percussion or palpation, normal fremitus, symmetrical and full respiratory excursions Cardiovascular: normal position and quality of the apical impulse, regular rhythm, normal first and second heart sounds, no murmurs, rubs or gallops Abdomen: no tenderness or distention, no masses by palpation, no abnormal pulsatility or arterial bruits, normal bowel sounds, no hepatosplenomegaly Extremities: no clubbing, cyanosis or edema; 2+ radial, ulnar and brachial pulses bilaterally; 2+ right femoral, posterior tibial and dorsalis pedis pulses; 2+ left femoral, posterior tibial and dorsalis pedis pulses; no subclavian or femoral bruits Neurological: grossly nonfocal Psych: Normal mood and affect   Wt Readings from Last 3 Encounters:  05/08/19 222 lb 9.6 oz (101 kg)  04/04/19 212 lb (96.2 kg)  12/20/18 215 lb (97.5 kg)      Studies/Labs Reviewed:   EKG:  EKG is ordered today.  Shows normal sinus rhythm without repolarization abnormalities.  Recent Labs: 12/20/2018: Magnesium 2.0; TSH 2.03 04/04/2019: ALT 23 05/08/2019: BUN 28; Creatinine, Ser 0.80; Hemoglobin 13.6; Platelets 199; Potassium 4.4; Sodium 143   Lipid Panel    Component Value Date/Time   CHOL 148 12/20/2018 1445   TRIG 72 12/20/2018 1445   HDL 59 12/20/2018 1445   CHOLHDL 2.5 12/20/2018 1445   VLDL 20 12/02/2016 1532   LDLCALC 74 12/20/2018 1445      ASSESSMENT:    1. 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 Pre-op testing   3. Essential hypertension   4. Hypercholesterolemia   5. Gastroesophageal reflux disease, unspecified whether esophagitis present   6. Class 2 severe obesity due to excess calories with serious comorbidity and body mass index (BMI) of 37.0 to 37.9 in adult [facility])      PLAN:  In order of problems listed above:  1. CAD: She has extensive coronary disease with a very high calcium score, but thankfully without obvious focal severe stenosis in any of the important conduits.  The 50% left main stenosis is however of concern.  We discussed further options for evaluation and she wants to have a definitive answer with a coronary angiogram. This procedure has been fully reviewed (as well as possible PCI/stent) with the patient and informed consent has been obtained. Scheduled with Dr. Kirke Corin later this week 2. HTN: Fair control.  There is room to add antianginal medications such as calcium channel blockers or long-acting nitrates or beta-blockers if we decrease her diuretics. 3. HLP: LDL 74 is close to target 4. GERD: Esophageal reflux and/or esophageal spasm could also explain her nocturnal pain differential diagnosis with coronary vasospasm 5. Obesity: In the long run, weight loss is highly desirable to improve her cardiovascular prognosis.     Medication Adjustments/Labs and Tests Ordered: Current medicines are reviewed at length with the patient today.  Concerns regarding medicines are outlined above.  Medication changes, Labs and Tests ordered today are listed in the Patient Instructions below. Patient Instructions  Medication Instructions:  Nitroglycerin has been sent in for you to your pharmacy. Place one tablet under the tongue every 5 minutes as needed for chest pain. Do not take more than 3 tablets. If you need 3, please call 911.    *If you need a refill on your  cardiac medications before your next appointment, please call your pharmacy*  Lab Work: Your provider would like for you to have the following labs today: CBC and BMET  If you have labs (blood work) drawn today and your tests are completely normal, you will receive your results only by: Marland Kitchen. MyChart Message (if you have MyChart) OR . A paper copy in the mail If you have any lab test that is abnormal or we need to change your treatment, we will call you to review the results.  You will need to have the coronavirus test completed prior to your procedure. An appointment has been made at 11:40 am  on 05/08/2019. This is a Drive Up Visit at the [facility] [address]. Someone will direct you to the appropriate testing line. Please tell them that you are there for procedure testing. Stay in your car and someone will be with you shortly. Please make sure to have all other labs completed before this test because you will need to stay quarantined until your procedure.   Testing/Procedures: Your physician has requested that you have a cardiac catheterization. Cardiac catheterization is used to diagnose and/or treat various heart conditions. Doctors may recommend this procedure for a number of different reasons. The most common reason is to evaluate chest pain. Chest pain can be a symptom of coronary artery disease (CAD), and cardiac catheterization can show whether plaque is narrowing or blocking your heart's arteries. This procedure is also used to evaluate the valves, as well as measure the blood flow and oxygen levels in different parts of your heart. For further information please visit [email or web address]. Please follow instruction sheet, as given.   Follow-Up: At [facility], you and your health needs are our priority.  As part of our continuing mission to provide you with exceptional heart care, we have created designated Provider Care Teams.  These Care Teams include your primary  Cardiologist (physician) and Advanced Practice Providers (APPs -  Physician Assistants and Nurse Practitioners) who all work together to provide you with the care you need, when you need it.  Your next appointment:   2-4 weeks following the cardiac cath  The format for your next appointment:   Either In Person or Virtual  Provider:   Thurmon FairMihai Ritha Sampedro, MD  Other Instructions    [facility] [address] [place] [id number] Dept: [phone] Loc: [phone]  Linus MakoJacqueline T Dibble  05/08/2019  You are scheduled for a Cardiac Catheterization on Friday, October 23 with Dr. Lorine BearsMuhammad Arida.  1. Please arrive [facility] the [facility]: [address] [place], [place] [address] at 7:00 AM (This time is two hours before your procedure to ensure your preparation). Free valet parking service is available.   Special note: Every effort is made to have your procedure done on time. Please understand that emergencies sometimes delay scheduled procedures.  2. Diet: Do not eat solid foods after midnight.  The patient may have clear liquids until 5am upon the day of the procedure.  3. Labs:  You will need to have the coronavirus test completed prior to your procedure. An appointment has been made at 11:40 am on 05/08/2019. This is a Drive Up Visit at the [facility] [address]. Someone will direct you to the appropriate testing line. Please tell them that you are there for procedure testing. Stay in your car and someone will be with you shortly. Please make sure to have  all other labs completed before this test because you will need to stay quarantined until your procedure.  4. Medication instructions in preparation for your procedure: Hold the Maxzide (triamterene-hydrochlorothiazide) the morning of the procedure.  On the morning of your procedure, take your  Aspirin and any morning medicines NOT listed above.  You may use sips of water.  5. Plan for one night stay--bring personal belongings. 6. Bring a current list of your medications and current insurance cards. 7. You MUST have a responsible person to drive you home. 8. Someone MUST be with you the first 24 hours after you arrive home or your discharge will be delayed. 9. Please wear clothes that are easy to get on and off and wear slip-on shoes.  Thank you for allowing [place] to care for you!   -- [facility] services      Signed, Sanda Klein, MD  05/09/2019 5:28 PM    [place], [place], [place]  [address] Phone: [phone]; Fax: [phone]

## 2019-05-08 NOTE — Progress Notes (Signed)
Cardiology Office Note    Date:  05/09/2019   ID:  Jade Singh, DOB 03/19/1951, MRN [id number]  PCP:  [name], MD  Cardiologist:   Thurmon Fair, MD   Chief Complaint  Patient presents with  . Coronary Artery Disease    Follow-up coronary CT angiogram    History of Present Illness:  Jade Singh is a 68 y.o. female with hypertension, hyperlipidemia, obesity and a strong family history of coronary artery disease. She had a extensive evaluation in 2013 when both her echocardiogram and her stress test were normal. She managed to lose 40 pounds in 2 years using Weight [employer] but has gained roughly half of that weight back over the last couple of years.   She had another episode of severe chest discomfort, similar to the one that occurred in [place] in the past.  Stress has a lot to do with it, since she is caring for 2 elderly relatives.  We reviewed her coronary CT angiogram.  She has a very high calcium score of 598 (96th percentile).  She had several moderate stenoses including 50% in the left main, 50-69% in the proximal LAD, 40-59% in the left circumflex.  However by fractional flow reserve the only significant stenosis was in the distal LAD (FFR 0.75).  She does develop some chest tightness with physical activity, although this is inconsistent.  She is very concerned about the possibility of of a future heart attack.  Some of her symptoms occur randomly at rest, with chest tightness that radiates up into her throat and suggest possible coronary vasospasm.  Past Medical History:  Diagnosis Date  . ANAL FISSURE, HX OF 11/06/2007   Qualifier: Diagnosis of  By: Misty Stanley CMA (AAMA), Marchelle Folks    . Anxiety   . Depression   . Difficulty sleeping    takes Ambien  . Diverticulosis 5/1/5  . DJD (degenerative joint disease)   . Family history of anesthesia complication    "mother quit breathing" 30 yrs ago pt does not know any more details. Pt has never had any  problems with anesthesia herself  . GERD (gastroesophageal reflux disease)   . Hyperlipidemia   . Hypertension   . Obese   . Peripheral edema   . Psoriasis    HANDS  . Psoriasis   . 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 Unspecified vitamin D deficiency   . Urgency of urination     Past Surgical History:  Procedure Laterality Date  . ABDOMINAL HYSTERECTOMY     partial  . APPENDECTOMY    . CARDIAC CATHETERIZATION  2005    no problems per pt   . KNEE ARTHROSCOPY     RT 2011, LT 2009  . TONSILLECTOMY AND ADENOIDECTOMY    . TOTAL KNEE ARTHROPLASTY Right 02/28/2014   Procedure: RIGHT TOTAL KNEE ARTHROPLASTY;  Surgeon: Javier Docker, MD;  Location: [facility];  Service: Orthopedics;  Laterality: Right;  . TUBAL LIGATION      Current Medications: Outpatient Medications Prior to Visit  Medication Sig Dispense Refill  . aspirin 81 MG tablet Take 81 mg by mouth daily.    . cetirizine (ZYRTEC) 10 MG tablet Take 10 mg by mouth daily.    . Cholecalciferol (VITAMIN D3) 5000 UNITS CAPS Take 5,000 Units by mouth 4 (four) times a week. Takes M,W,TH,F    . diazepam (VALIUM) 10 MG tablet Take 1/2-1 tablet at Bedtime ONLY if needed for Sleep &  limit to 5 days /week to  avoid addiction (Patient taking differently: Take 5-10 mg by mouth daily as needed for anxiety or sleep. ) 90 tablet 0  . pantoprazole (PROTONIX) 40 MG tablet Take 1 tablet Daily to Prevent Indigestion & Heartburn (Patient taking differently: Take 40 mg by mouth daily. Prevent Indigestion & Heartburn) 90 tablet 1  . rosuvastatin (CRESTOR) 40 MG tablet Take 1 tablet daily for Cholesterol (Patient taking differently: Take 20 mg by mouth See admin instructions. Take four times a week) 90 tablet 3  . triamterene-hydrochlorothiazide (MAXZIDE-25) 37.5-25 MG tablet TAKE 1 TABLET BY MOUTH AT BEDTIME. KEEP APPOINTMENT FOR FUTURE REFILLS (Patient taking differently: Take 1 tablet by mouth daily. ) 30 tablet 0  . zolpidem (AMBIEN) 5 MG tablet TAKE 1/2 TO 1  TABLET BY MOUTH AT BEDTIME ONE HOUR BEFORE SLEEP (Patient taking differently: Take 5 mg by mouth at bedtime. ) 30 tablet 2  . metoprolol tartrate (LOPRESSOR) 50 MG tablet Take 2 tablet (100mg ) by mouth once for procedure. 2 tablet 0   No facility-administered medications prior to visit.      Allergies:   Doxycycline, Levaquin [levofloxacin in d5w], and Oruvail [ketoprofen]   Social History   Socioeconomic History  . Marital status: Married    Spouse name: Not on file  . Number of children: Not on file  . Years of education: Not on file  . Highest education level: Not on file  Occupational History  . Not on file  Social Needs  . Financial resource strain: Not on file  . Food insecurity    Worry: Not on file    Inability: Not on file  . Transportation needs    Medical: Not on file    Non-medical: Not on file  Tobacco Use  . Smoking status: Former Smoker    Quit date: 07/19/2000    Years since quitting: 18.8  . Smokeless tobacco: Never Used  Substance and Sexual Activity  . Alcohol use: Yes    Comment: occ  . Drug use: No  . Sexual activity: Not on file  Lifestyle  . Physical activity    Days per week: Not on file    Minutes per session: Not on file  . Stress: Not on file  Relationships  . Social 09/16/2000 on phone: Not on file    Gets together: Not on file    Attends religious service: Not on file    Active member of club or organization: Not on file    Attends meetings of clubs or organizations: Not on file    Relationship status: Not on file  Other Topics Concern  . Not on file  Social History Narrative  . Not on file     Family History:  The patient's family history includes Cancer (age of onset: 65) in her brother; Diabetes in her paternal uncle; Heart attack in her maternal grandfather and mother; Heart disease in her paternal grandfather; Hyperlipidemia in her mother; Stroke in her mother.   ROS:   Please see the history of present illness.     ROS All other systems reviewed and are negative.   PHYSICAL EXAM:   VS:  BP 136/76   Pulse 78   Ht 5\' 5"  (1.651 m)   Wt 222 lb 9.6 oz (101 kg)   SpO2 97%   BMI 37.04 kg/m     General: Alert, oriented x3, no distress, audibly obese Head: no evidence of trauma, PERRL, EOMI, no exophtalmos or lid lag, no myxedema, no  xanthelasma; normal ears, nose and oropharynx Neck: normal jugular venous pulsations and no hepatojugular reflux; brisk carotid pulses without delay and no carotid bruits Chest: clear to auscultation, no signs of consolidation by percussion or palpation, normal fremitus, symmetrical and full respiratory excursions Cardiovascular: normal position and quality of the apical impulse, regular rhythm, normal first and second heart sounds, no murmurs, rubs or gallops Abdomen: no tenderness or distention, no masses by palpation, no abnormal pulsatility or arterial bruits, normal bowel sounds, no hepatosplenomegaly Extremities: no clubbing, cyanosis or edema; 2+ radial, ulnar and brachial pulses bilaterally; 2+ right femoral, posterior tibial and dorsalis pedis pulses; 2+ left femoral, posterior tibial and dorsalis pedis pulses; no subclavian or femoral bruits Neurological: grossly nonfocal Psych: Normal mood and affect   Wt Readings from Last 3 Encounters:  05/08/19 222 lb 9.6 oz (101 kg)  04/04/19 212 lb (96.2 kg)  12/20/18 215 lb (97.5 kg)      Studies/Labs Reviewed:   EKG:  EKG is ordered today.  Shows normal sinus rhythm without repolarization abnormalities.  Recent Labs: 12/20/2018: Magnesium 2.0; TSH 2.03 04/04/2019: ALT 23 05/08/2019: BUN 28; Creatinine, Ser 0.80; Hemoglobin 13.6; Platelets 199; Potassium 4.4; Sodium 143   Lipid Panel    Component Value Date/Time   CHOL 148 12/20/2018 1445   TRIG 72 12/20/2018 1445   HDL 59 12/20/2018 1445   CHOLHDL 2.5 12/20/2018 1445   VLDL 20 12/02/2016 1532   LDLCALC 74 12/20/2018 1445      ASSESSMENT:    1. 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 Pre-op testing   3. Essential hypertension   4. Hypercholesterolemia   5. Gastroesophageal reflux disease, unspecified whether esophagitis present   6. Class 2 severe obesity due to excess calories with serious comorbidity and body mass index (BMI) of 37.0 to 37.9 in adult (HCC)      PLAN:  In order of problems listed above:  1. CAD: She has extensive coronary disease with a very high calcium score, but thankfully without obvious focal severe stenosis in any of the important conduits.  The 50% left main stenosis is however of concern.  We discussed further options for evaluation and she wants to have a definitive answer with a coronary angiogram. This procedure has been fully reviewed (as well as possible PCI/stent) with the patient and informed consent has been obtained. Scheduled with Dr. Arida later this week 2. HTN: Fair control.  There is room to add antianginal medications such as calcium channel blockers or long-acting nitrates or beta-blockers if we decrease her diuretics. 3. HLP: LDL 74 is close to target 4. GERD: Esophageal reflux and/or esophageal spasm could also explain her nocturnal pain differential diagnosis with coronary vasospasm 5. Obesity: In the long run, weight loss is highly desirable to improve her cardiovascular prognosis.     Medication Adjustments/Labs and Tests Ordered: Current medicines are reviewed at length with the patient today.  Concerns regarding medicines are outlined above.  Medication changes, Labs and Tests ordered today are listed in the Patient Instructions below. Patient Instructions  Medication Instructions:  Nitroglycerin has been sent in for you to your pharmacy. Place one tablet under the tongue every 5 minutes as needed for chest pain. Do not take more than 3 tablets. If you need 3, please call 911.    *If you need a refill on your  cardiac medications before your next appointment, please call your pharmacy*    Lab Work: Your provider would like for you to have the following labs today: CBC and BMET  If you have labs (blood work) drawn today and your tests are completely normal, you will receive your results only by: Marland Kitchen. MyChart Message (if you have MyChart) OR . A paper copy in the mail If you have any lab test that is abnormal or we need to change your treatment, we will call you to review the results.  You will need to have the coronavirus test completed prior to your procedure. An appointment has been made at 11:40 am  on 05/08/2019. This is a Drive Up Visit at the [facility] [address]. Someone will direct you to the appropriate testing line. Please tell them that you are there for procedure testing. Stay in your car and someone will be with you shortly. Please make sure to have all other labs completed before this test because you will need to stay quarantined until your procedure.   Testing/Procedures: Your physician has requested that you have a cardiac catheterization. Cardiac catheterization is used to diagnose and/or treat various heart conditions. Doctors may recommend this procedure for a number of different reasons. The most common reason is to evaluate chest pain. Chest pain can be a symptom of coronary artery disease (CAD), and cardiac catheterization can show whether plaque is narrowing or blocking your heart's arteries. This procedure is also used to evaluate the valves, as well as measure the blood flow and oxygen levels in different parts of your heart. For further information please visit [email or web address]. Please follow instruction sheet, as given.   Follow-Up: At [facility], you and your health needs are our priority.  As part of our continuing mission to provide you with exceptional heart care, we have created designated Provider Care Teams.  These Care Teams include your primary  Cardiologist (physician) and Advanced Practice Providers (APPs -  Physician Assistants and Nurse Practitioners) who all work together to provide you with the care you need, when you need it.  Your next appointment:   2-4 weeks following the cardiac cath  The format for your next appointment:   Either In Person or Virtual  Provider:   Thurmon FairMihai Cleveland Paiz, MD  Other Instructions    [facility] [address] [place] [id number] Dept: [phone] Loc: [phone]  Linus MakoJacqueline T Dibble  05/08/2019  You are scheduled for a Cardiac Catheterization on Friday, October 23 with Dr. Lorine BearsMuhammad Arida.  1. Please arrive [facility] the [facility] at [facility]: [address] [place], [place] [address] at 7:00 AM (This time is two hours before your procedure to ensure your preparation). Free valet parking service is available.   Special note: Every effort is made to have your procedure done on time. Please understand that emergencies sometimes delay scheduled procedures.  2. Diet: Do not eat solid foods after midnight.  The patient may have clear liquids until 5am upon the day of the procedure.  3. Labs:  You will need to have the coronavirus test completed prior to your procedure. An appointment has been made at 11:40 am on 05/08/2019. This is a Drive Up Visit at the [facility] [address]. Someone will direct you to the appropriate testing line. Please tell them that you are there for procedure testing. Stay in your car and someone will be with you shortly. Please make sure to have  all other labs completed before this test because you will need to stay quarantined until your procedure.  4. Medication instructions in preparation for your procedure: Hold the Maxzide (triamterene-hydrochlorothiazide) the morning of the procedure.  On the morning of your procedure, take your  Aspirin and any morning medicines NOT listed above.  You may use sips of water.  5. Plan for one night stay--bring personal belongings. 6. Bring a current list of your medications and current insurance cards. 7. You MUST have a responsible person to drive you home. 8. Someone MUST be with you the first 24 hours after you arrive home or your discharge will be delayed. 9. Please wear clothes that are easy to get on and off and wear slip-on shoes.  Thank you for allowing [place] to care for you!   -- [facility] services      Signed, Sanda Klein, MD  05/09/2019 5:28 PM    [place], [place], [place]  [address] Phone: [phone]; Fax: [phone]

## 2019-05-09 ENCOUNTER — Encounter: Payer: Self-pay | Admitting: Cardiovascular Disease

## 2019-05-10 ENCOUNTER — Telehealth: Payer: Self-pay | Admitting: *Deleted

## 2019-05-10 LAB — NOVEL CORONAVIRUS, NAA (HOSP ORDER, SEND-OUT TO REF LAB; TAT 18-24 HRS): SARS-CoV-2, NAA: NOT DETECTED

## 2019-05-10 NOTE — Telephone Encounter (Signed)
Pt contacted pre-catheterization scheduled at [facility] for: Friday May 11, 2019 9 AM Verified arrival time and place: [facility]) at: 7 AM   No solid food after midnight prior to cath, clear liquids until 5 AM day of procedure. Contrast allergy: no  Hold: Triamterene/HCT-AM of procedure  Except hold medications AM meds can be  taken pre-cath with sip of water including: ASA 81 mg   Confirmed patient has responsible adult to drive home post procedure and observe 24 hours after arriving home: yes  Currently, due to Covid-19 pandemic, only one support person will be allowed with patient. Must be the same support person for that patient's entire stay, will be screened and required to wear a mask. They will be asked to wait in the waiting room for the duration of the patient's stay.  Patients are required to wear a mask when they enter the hospital.      COVID-19 Pre-Screening Questions:  . In the past 7 to 10 days have you had a cough,  shortness of breath, headache, congestion, fever (100 or greater) body aches, chills, sore throat, or sudden loss of taste or sense of smell? no . Have you been around anyone with known Covid 19? no . Have you been around anyone who is awaiting Covid 19 test results in the past 7 to 10 days? no . Have you been around anyone who has been exposed to Covid 19, or has mentioned symptoms of Covid 19 within the past 7 to 10 days? no   I reviewed procedure/mask/visitor instructions, Covid-19 screening questions with patient, she verbalized understanding, thanked me for call.

## 2019-05-11 ENCOUNTER — Encounter (HOSPITAL_COMMUNITY)
Admission: RE | Disposition: A | Discharge status: Home / Self Care | Payer: Self-pay | Source: Home / Self Care | Attending: Cardiovascular Disease

## 2019-05-11 ENCOUNTER — Other Ambulatory Visit: Payer: Self-pay

## 2019-05-11 ENCOUNTER — Ambulatory Visit (HOSPITAL_COMMUNITY)
Admission: RE | Admit: 2019-05-11 | Discharge: 2019-05-11 | Disposition: A | Discharge status: Home / Self Care | Payer: Medicare Other | Attending: Cardiovascular Disease | Admitting: Cardiovascular Disease

## 2019-05-11 DIAGNOSIS — R079 Chest pain, unspecified: Secondary | ICD-10-CM | POA: Insufficient documentation

## 2019-05-11 DIAGNOSIS — E7800 Pure hypercholesterolemia, unspecified: Secondary | ICD-10-CM | POA: Insufficient documentation

## 2019-05-11 DIAGNOSIS — Z7982 Long term (current) use of aspirin: Secondary | ICD-10-CM | POA: Insufficient documentation

## 2019-05-11 DIAGNOSIS — Z87891 Personal history of nicotine dependence: Secondary | ICD-10-CM | POA: Insufficient documentation

## 2019-05-11 DIAGNOSIS — E669 Obesity, unspecified: Secondary | ICD-10-CM | POA: Diagnosis not present

## 2019-05-11 DIAGNOSIS — Z881 Allergy status to other antibiotic agents status: Secondary | ICD-10-CM | POA: Insufficient documentation

## 2019-05-11 DIAGNOSIS — Z79899 Other long term (current) drug therapy: Secondary | ICD-10-CM | POA: Insufficient documentation

## 2019-05-11 DIAGNOSIS — F329 Major depressive disorder, single episode, unspecified: Secondary | ICD-10-CM | POA: Insufficient documentation

## 2019-05-11 DIAGNOSIS — E785 Hyperlipidemia, unspecified: Secondary | ICD-10-CM | POA: Diagnosis not present

## 2019-05-11 DIAGNOSIS — Z6837 Body mass index (BMI) 37.0-37.9, adult: Secondary | ICD-10-CM | POA: Insufficient documentation

## 2019-05-11 DIAGNOSIS — K219 Gastro-esophageal reflux disease without esophagitis: Secondary | ICD-10-CM | POA: Insufficient documentation

## 2019-05-11 DIAGNOSIS — I10 Essential (primary) hypertension: Secondary | ICD-10-CM | POA: Diagnosis not present

## 2019-05-11 HISTORY — PX: LEFT HEART CATH AND CORONARY ANGIOGRAPHY: CATH118249

## 2019-05-11 SURGERY — LEFT HEART CATH AND CORONARY ANGIOGRAPHY
Anesthesia: LOCAL

## 2019-05-11 MED ORDER — HEPARIN (PORCINE) IN NACL 1000-0.9 UT/500ML-% IV SOLN
INTRAVENOUS | Status: AC
  Filled 2019-05-11: qty 1000

## 2019-05-11 MED ORDER — SODIUM CHLORIDE 0.9% FLUSH: 3.0000 mL | Freq: Two times a day (BID) | INTRAVENOUS | Status: DC

## 2019-05-11 MED ORDER — MIDAZOLAM HCL 2 MG/2ML IJ SOLN
INTRAMUSCULAR | Status: AC
  Filled 2019-05-11: qty 2

## 2019-05-11 MED ORDER — VERAPAMIL HCL 2.5 MG/ML IV SOLN
INTRAVENOUS | Status: AC
  Filled 2019-05-11: qty 2

## 2019-05-11 MED ORDER — ACETAMINOPHEN 325 MG PO TABS: 650.0000 mg | ORAL_TABLET | ORAL | Status: DC | PRN

## 2019-05-11 MED ORDER — SODIUM CHLORIDE 0.9 % IV SOLN: INTRAVENOUS | Status: DC

## 2019-05-11 MED ORDER — SODIUM CHLORIDE 0.9% FLUSH: 3.0000 mL | INTRAVENOUS | Status: DC | PRN

## 2019-05-11 MED ORDER — HEPARIN SODIUM (PORCINE) 1000 UNIT/ML IJ SOLN
INTRAMUSCULAR | Status: AC
  Filled 2019-05-11: qty 1

## 2019-05-11 MED ORDER — HEPARIN SODIUM (PORCINE) 1000 UNIT/ML IJ SOLN
INTRAMUSCULAR | Status: DC | PRN
  Administered 2019-05-11: 5000 [IU] via INTRAVENOUS

## 2019-05-11 MED ORDER — IOHEXOL 350 MG/ML SOLN
INTRAVENOUS | Status: DC | PRN
  Administered 2019-05-11: 70 mL via INTRA_ARTERIAL

## 2019-05-11 MED ORDER — HEPARIN (PORCINE) IN NACL 1000-0.9 UT/500ML-% IV SOLN
INTRAVENOUS | Status: DC | PRN
  Administered 2019-05-11 (×2): 500 mL

## 2019-05-11 MED ORDER — ASPIRIN 81 MG PO CHEW: 81.0000 mg | CHEWABLE_TABLET | ORAL | Status: DC

## 2019-05-11 MED ORDER — LIDOCAINE HCL (PF) 1 % IJ SOLN
INTRAMUSCULAR | Status: AC
  Filled 2019-05-11: qty 30

## 2019-05-11 MED ORDER — MIDAZOLAM HCL 2 MG/2ML IJ SOLN
INTRAMUSCULAR | Status: DC | PRN
  Administered 2019-05-11: 1 mg via INTRAVENOUS

## 2019-05-11 MED ORDER — SODIUM CHLORIDE 0.9 % IV SOLN: 250.0000 mL | INTRAVENOUS | Status: DC | PRN

## 2019-05-11 MED ORDER — VERAPAMIL HCL 2.5 MG/ML IV SOLN
INTRAVENOUS | Status: DC | PRN
  Administered 2019-05-11: 10 mL via INTRA_ARTERIAL

## 2019-05-11 MED ORDER — FENTANYL CITRATE (PF) 100 MCG/2ML IJ SOLN
INTRAMUSCULAR | Status: AC
  Filled 2019-05-11: qty 2

## 2019-05-11 MED ORDER — SODIUM CHLORIDE 0.9 % WEIGHT BASED INFUSION: 1.0000 mL/kg/h | INTRAVENOUS | Status: DC

## 2019-05-11 MED ORDER — ONDANSETRON HCL 4 MG/2ML IJ SOLN: 4.0000 mg | Freq: Four times a day (QID) | INTRAMUSCULAR | Status: DC | PRN

## 2019-05-11 MED ORDER — SODIUM CHLORIDE 0.9 % WEIGHT BASED INFUSION
3.0000 mL/kg/h | INTRAVENOUS | Status: AC
  Administered 2019-05-11: 3 mL/kg/h via INTRAVENOUS

## 2019-05-11 MED ORDER — FENTANYL CITRATE (PF) 100 MCG/2ML IJ SOLN
INTRAMUSCULAR | Status: DC | PRN
  Administered 2019-05-11: 50 ug via INTRAVENOUS

## 2019-05-11 MED ORDER — LIDOCAINE HCL (PF) 1 % IJ SOLN
INTRAMUSCULAR | Status: DC | PRN
  Administered 2019-05-11: 2 mL

## 2019-05-11 SURGICAL SUPPLY — 11 items
CATH INFINITI 5FR ANG PIGTAIL (CATHETERS) ×1 IMPLANT
CATH INFINITI 5FR JK (CATHETERS) ×1 IMPLANT
DEVICE RAD COMP TR BAND LRG (VASCULAR PRODUCTS) ×1 IMPLANT
GLIDESHEATH SLEND SS 6F .021 (SHEATH) ×1 IMPLANT
GUIDEWIRE INQWIRE 1.5J.035X260 (WIRE) IMPLANT
INQWIRE 1.5J .035X260CM (WIRE) ×2
KIT HEART LEFT (KITS) ×2 IMPLANT
PACK CARDIAC CATHETERIZATION (CUSTOM PROCEDURE TRAY) ×2 IMPLANT
SYR MEDRAD MARK 7 150ML (SYRINGE) ×2 IMPLANT
TRANSDUCER W/STOPCOCK (MISCELLANEOUS) ×2 IMPLANT
TUBING CIL FLEX 10 FLL-RA (TUBING) ×2 IMPLANT

## 2019-05-11 NOTE — Discharge Instructions (Signed)
°DRINK PLENTY OF FLUIDS FOR THE NEXT 2-3 DAYS. ° °KEEP AFFECTED ARM ELEVATED FOR THE REMAINDER OF THE DAY. ° °Radial Site Care ° °This sheet gives you information about how to care for yourself after your procedure. Your health care provider may also give you more specific instructions. If you have problems or questions, contact your health care provider. °What can I expect after the procedure? °After the procedure, it is common to have: °· Bruising and tenderness at the catheter insertion area. °Follow these instructions at home: °Medicines °· Take over-the-counter and prescription medicines only as told by your health care provider. °Insertion site care °· Follow instructions from your health care provider about how to take care of your insertion site. Make sure you: °? Wash your hands with soap and water before you change your bandage (dressing). If soap and water are not available, use hand sanitizer. °? Change your dressing as told by your health care provider. °· Check your insertion site every day for signs of infection. Check for: °? Redness, swelling, or pain. °? Fluid or blood. °? Pus or a bad smell. °? Warmth. °· Do not take baths, swim, or use a hot tub until your health care provider approves. °· You may shower 24-48 hours after the procedure. °? Remove the dressing and gently wash the site with plain soap and water. °? Pat the area dry with a clean towel. °? Do not rub the site. That could cause bleeding. °· Do not apply powder or lotion to the site. °Activity ° °· For 24 hours after the procedure, or as directed by your health care provider: °? Do not flex or bend the affected arm. °? Do not push or pull heavy objects with the affected arm. °? Do not drive yourself home from the hospital or clinic. You may drive 24 hours after the procedure unless your health care provider tells you not to. °? Do not operate machinery or power tools. °· Do not lift anything that is heavier than 10 lb for 5  days. °· Ask your health care provider when it is okay to: °? Return to work or school. °? Resume usual physical activities or sports. °? Resume sexual activity. °General instructions: °· If the catheter site starts to bleed, raise your arm and put firm pressure on the site. If the bleeding does not stop, get help right away. This is a medical emergency. °· If you went home on the same day as your procedure, a responsible adult should be with you for the first 24 hours after you arrive home. °· Keep all follow-up visits as told by your health care provider. This is important. °Contact a health care provider if: °· You have a fever. °· You have redness, swelling, or yellow drainage around your insertion site. °Get help right away if: °· You have unusual pain at the radial site. °· The catheter insertion area swells very fast. °· The insertion area is bleeding, and the bleeding does not stop when you hold steady pressure on the area. °· Your arm or hand becomes pale, cool, tingly, or numb. °These symptoms may represent a serious problem that is an emergency. Do not wait to see if the symptoms will go away. Get medical help right away. Call your local emergency services (911 in the [place] Do not drive yourself to the hospital. °Summary °· After the procedure, it is common to have bruising and tenderness at the site. °· Follow instructions from your health care provider   about how to take care of your radial site wound. Check the wound every day for signs of infection. °· Do not lift anything that is heavier than 10 lb for 5 days. ° °This information is not intended to replace advice given to you by your health care provider. Make sure you discuss any questions you have with your health care provider. °Document Released: 08/07/2010 Document Revised: 08/10/2017 Document Reviewed: 08/10/2017 °Elsevier Patient Education © 2020 [employer] ° °

## 2019-05-11 NOTE — Progress Notes (Signed)
Patient began to bleed after removing TRB. Pressure held for 10 minutes. Pressure dsg applied

## 2019-05-11 NOTE — Interval H&P Note (Signed)
Cath Lab Visit (complete for each Cath Lab visit)  Clinical Evaluation Leading to the Procedure:   ACS: No.  Non-ACS:    Anginal Classification: CCS II  Anti-ischemic medical therapy: No Therapy  Non-Invasive Test Results: Intermediate-risk stress test findings: cardiac mortality 1-3%/year  Prior CABG: No previous CABG      History and Physical Interval Note:  05/11/2019 11:23 AM  Jade Singh  has presented today for surgery, with the diagnosis of angina.  The various methods of treatment have been discussed with the patient and family. After consideration of risks, benefits and other options for treatment, the patient has consented to  Procedure(s): LEFT HEART CATH AND CORONARY ANGIOGRAPHY (N/A) as a surgical intervention.  The patient's history has been reviewed, patient examined, no change in status, stable for surgery.  I have reviewed the patient's chart and labs.  Questions were answered to the patient's satisfaction.     Kathlyn [place]

## 2019-05-14 ENCOUNTER — Encounter (HOSPITAL_COMMUNITY): Payer: Self-pay | Admitting: Cardiovascular Disease

## 2019-05-16 ENCOUNTER — Other Ambulatory Visit: Payer: Self-pay

## 2019-05-16 ENCOUNTER — Ambulatory Visit
Admission: RE | Admit: 2019-05-16 | Discharge: 2019-05-16 | Disposition: A | Discharge status: Home / Self Care | Payer: Medicare Other | Source: Ambulatory Visit | Attending: Adult Health | Admitting: Adult Health

## 2019-05-16 DIAGNOSIS — Z780 Asymptomatic menopausal state: Secondary | ICD-10-CM | POA: Diagnosis not present

## 2019-05-16 DIAGNOSIS — Z13820 Encounter for screening for osteoporosis: Secondary | ICD-10-CM | POA: Diagnosis not present

## 2019-05-16 DIAGNOSIS — E2839 Other primary ovarian failure: Secondary | ICD-10-CM

## 2019-05-20 ENCOUNTER — Other Ambulatory Visit: Payer: Self-pay | Admitting: Cardiovascular Disease

## 2019-05-23 NOTE — Progress Notes (Signed)
Cardiology Office Note   Date:  05/28/2019   ID:  Jade Singh, DOB 02/19/1951, MRN [id number]  PCP:  Jade CowboyMcKeown, William, MD  Cardiologist: Dr. Royann Singh  No chief complaint on file.    History of Present Illness: Jade Singh is a 68 y.o. female who presents for ongoing assessment and management of hypertension, hyperlipidemia, with strong family history of coronary artery disease. Most recent CT angiogram revealed a very high calcium score of 598, she also had moderate stenosis including 50% in her left main, 50% to 60% in the proximal LAD, 40 to 49% in the left circumflex.  However, FFR was 0.75.  She was complaining of chest tightness with physical activity although this was inconsistent.  She was considered for questionable coronary artery spasm.  However on follow-up discussion with the patient it was decided to proceed with cardiac catheterization for definitive evaluation of her coronary anatomy.  The patient did have left heart cath by Dr. Kirke Singh on 05/11/2019 which revealed mildly calcified coronary arteries with minimal luminal irregularities.  There was no evidence of obstructive coronary artery disease.  She had normal LV systolic function and left ventricular end-diastolic pressure.  She was recommended to continue aggressive treatment of risk factors.  She comes today without complaints. She denies recurrent chest pain or DOE. She is medically compliant. She has not yet taken her medication today. She is interested in learning more about low cholesterol diet.   Past Medical History:  Diagnosis Date  . ANAL FISSURE, HX OF 11/06/2007   Qualifier: Diagnosis of  By: Misty StanleyLewellyn CMA (AAMA), Marchelle FolksAmanda    . Anxiety   . Depression   . Difficulty sleeping    takes Ambien  . Diverticulosis 5/1/5  . DJD (degenerative joint disease)   . Family history of anesthesia complication    "mother quit breathing" 30 yrs ago pt does not know any more details. Pt has never had any  problems with anesthesia herself  . GERD (gastroesophageal reflux disease)   . Hyperlipidemia   . Hypertension   . Obese   . Peripheral edema   . Psoriasis    HANDS  . Psoriasis   . 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 Unspecified vitamin D deficiency   . Urgency of urination     Past Surgical History:  Procedure Laterality Date  . ABDOMINAL HYSTERECTOMY     partial  . APPENDECTOMY    . CARDIAC CATHETERIZATION  2005    no problems per pt   . KNEE ARTHROSCOPY     RT 2011, LT 2009  . LEFT HEART CATH AND CORONARY ANGIOGRAPHY N/A 05/11/2019   Procedure: LEFT HEART CATH AND CORONARY ANGIOGRAPHY;  Surgeon: Iran OuchArida, Muhammad A, MD;  Location: [facility];  Service: Cardiovascular;  Laterality: N/A;  . TONSILLECTOMY AND ADENOIDECTOMY    . 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 TUBAL LIGATION       Current Outpatient Medications  Medication Sig Dispense Refill  . aspirin 81 MG tablet Take 81 mg by mouth daily.    . calcipotriene (DOVONOX) 0.005 % ointment Apply 1 application topically as needed (Hands).    . cephALEXin (KEFLEX) 500 MG capsule Take 500 mg by mouth as needed.    . cetirizine (ZYRTEC) 10 MG tablet Take 10 mg by mouth daily.    . Cholecalciferol (VITAMIN D3) 5000 UNITS  CAPS Take 5,000 Units by mouth 4 (four) times a week. Takes M,W,TH,F    . diazepam (VALIUM) 10 MG tablet Take 1/2-1 tablet at Bedtime ONLY if needed for Sleep &  limit to 5 days /week to avoid addiction (Patient taking differently: Take 5-10 mg by mouth daily as needed for anxiety or sleep. ) 90 tablet 0  . fluticasone (FLONASE) 50 MCG/ACT nasal spray Place 1 spray into both nostrils daily as needed for allergies or rhinitis.    . Magnesium 250 MG TABS Take 250 mg by mouth daily.    . Multiple Vitamins-Minerals (MULTIVITAMIN WITH MINERALS) tablet Take 1 tablet by mouth  daily.    . naproxen sodium (ALEVE) 220 MG tablet Take 440 mg by mouth as needed (Knees).    . nitroGLYCERIN (NITROSTAT) 0.4 MG SL tablet Place 1 tablet (0.4 mg total) under the tongue every 5 (five) minutes as needed for chest pain. 25 tablet 1  . oxyCODONE-acetaminophen (PERCOCET/ROXICET) 5-325 MG tablet Take 1 tablet by mouth every 4 (four) hours as needed for moderate pain or severe pain (Knee pain).    . pantoprazole (PROTONIX) 40 MG tablet Take 1 tablet Daily to Prevent Indigestion & Heartburn (Patient taking differently: Take 40 mg by mouth daily. Prevent Indigestion & Heartburn) 90 tablet 1  . rosuvastatin (CRESTOR) 40 MG tablet Take 1 tablet daily for Cholesterol (Patient taking differently: Take 20 mg by mouth See admin instructions. Take four times a week) 90 tablet 3  . triamterene-hydrochlorothiazide (MAXZIDE-25) 37.5-25 MG tablet Take 1 tablet by mouth at bedtime. 90 tablet 3  . zolpidem (AMBIEN) 5 MG tablet TAKE 1/2 TO 1 TABLET BY MOUTH AT BEDTIME ONE HOUR BEFORE SLEEP (Patient taking differently: Take 5 mg by mouth at bedtime. ) 30 tablet 2  . ezetimibe (ZETIA) 10 MG tablet Take 1 tablet (10 mg total) by mouth daily. 90 tablet 3   No current facility-administered medications for this visit.     Allergies:   Doxycycline, Levaquin [levofloxacin in d5w], and Oruvail [ketoprofen]    Social History:  The patient  reports that she quit smoking about 18 years ago. She has never used smokeless tobacco. She reports current alcohol use. She reports that she does not use drugs.   Family History:  The patient's family history includes Cancer (age of onset: 54) in her brother; Diabetes in her paternal uncle; Heart attack in her maternal grandfather and mother; Heart disease in her paternal grandfather; Hyperlipidemia in her mother; Stroke in her mother.    ROS: All other systems are reviewed and negative. Unless otherwise mentioned in H&P    PHYSICAL EXAM: VS:  BP 140/76   Pulse 64    Temp (!) 97 F (36.1 C)   Ht 5\' 5"  (1.651 m)   Wt 220 lb 6.4 oz (100 kg)   SpO2 97%   BMI 36.68 kg/m  , BMI Body mass index is 36.68 kg/m. GEN: Well nourished, well developed, in no acute distress, obese HEENT: normal Neck: no JVD, carotid bruits, or masses Cardiac: RRR; no murmurs, rubs, or gallops,no edema  Respiratory:  Clear to auscultation bilaterally, normal work of breathing GI: soft, nontender, nondistended, + BS MS: no deformity or atrophy Skin: warm and dry, no rash Neuro:  Strength and sensation are intact Psych: euthymic mood, full affect   EKG:  Not completed this office visit  Recent Labs: 12/20/2018: Magnesium 2.0; TSH 2.03 04/04/2019: ALT 23 05/08/2019: BUN 28; Creatinine, Ser 0.80; Hemoglobin 13.6; Platelets 199; Potassium  4.4; Sodium 143    Lipid Panel    Component Value Date/Time   CHOL 148 12/20/2018 1445   TRIG 72 12/20/2018 1445   HDL 59 12/20/2018 1445   CHOLHDL 2.5 12/20/2018 1445   VLDL 20 12/02/2016 1532   LDLCALC 74 12/20/2018 1445      Wt Readings from Last 3 Encounters:  05/28/19 220 lb 6.4 oz (100 kg)  05/11/19 220 lb (99.8 kg)  05/08/19 222 lb 9.6 oz (101 kg)      Other studies Reviewed: [facility]-05/11/2019  The left ventricular systolic function is normal.  LV end diastolic pressure is normal.  The left ventricular ejection fraction is 55-65% by visual estimate.   1.  Mildly calcified coronary arteries with minimal luminal irregularities.  I do not see evidence of obstructive coronary artery disease. 2.  Normal LV systolic function and left ventricular end-diastolic pressure.  ASSESSMENT AND PLAN:  1. Chest Pain: Minimal CAD. She will be continued on risk factor modification.  2. Hypertension: Elevated today but she has not taken her medication today yet. Will need to follow this as she is not at goal today. She continues on Triameterene/HCTZ. She is instructed on low sodium diet.   3. Hypercholesterolemia:  She is given copy of  low cholesterol diet.  Zetia 10 mg will be prescribed. Follow up labs are going to be completed by PCP in December.   Current medicines are reviewed at length with the patient today.    Labs/ tests ordered today include: None  Bettey Mare. Liborio Nixon, ANP, AACC   05/28/2019 10:15 AM    [facility] [address] Office [phone] Fax [phone]  Notice: This dictation was prepared with Dragon dictation along with smaller phrase technology. Any transcriptional errors that result from this process are unintentional and may not be corrected upon review.

## 2019-05-28 ENCOUNTER — Other Ambulatory Visit: Payer: Self-pay

## 2019-05-28 ENCOUNTER — Encounter: Payer: Self-pay | Admitting: Adult Health

## 2019-05-28 ENCOUNTER — Ambulatory Visit (INDEPENDENT_AMBULATORY_CARE_PROVIDER_SITE_OTHER): Payer: Medicare Other | Admitting: Adult Health

## 2019-05-28 VITALS — BP 140/76 | HR 64 | Temp 97.0°F | Ht 65.0 in | Wt 220.4 lb

## 2019-05-28 DIAGNOSIS — I10 Essential (primary) hypertension: Secondary | ICD-10-CM | POA: Diagnosis not present

## 2019-05-28 DIAGNOSIS — E7800 Pure hypercholesterolemia, unspecified: Secondary | ICD-10-CM | POA: Diagnosis not present

## 2019-05-28 DIAGNOSIS — I2510 Atherosclerotic heart disease of native coronary artery without angina pectoris: Secondary | ICD-10-CM | POA: Diagnosis not present

## 2019-05-28 MED ORDER — EZETIMIBE 10 MG PO TABS: 10.0000 mg | ORAL_TABLET | Freq: Every day | ORAL | 3 refills | Status: DC

## 2019-05-28 NOTE — Patient Instructions (Signed)
Medication Instructions:   START- Zetia 10 mg by mouth daily  *If you need a refill on your cardiac medications before your next appointment, please call your pharmacy*  Lab Work: None Ordered  Testing/Procedures: None Ordered  Follow-Up: At [facility], you and your health needs are our priority.  As part of our continuing mission to provide you with exceptional heart care, we have created designated Provider Care Teams.  These Care Teams include your primary Cardiologist (physician) and Advanced Practice Providers (APPs -  Physician Assistants and Nurse Practitioners) who all work together to provide you with the care you need, when you need it.  Your next appointment:   6 months  The format for your next appointment:   In Person  Provider:   Sanda Klein, MD  Other Instructions                 HAPPY BIRTHDAY  Fat and Cholesterol Restricted Eating Plan Getting too much fat and cholesterol in your diet may cause health problems. Choosing the right foods helps keep your fat and cholesterol at normal levels. This can keep you from getting certain diseases. Your doctor may recommend an eating plan that includes:  Total fat: ______% or less of total calories a day.  Saturated fat: ______% or less of total calories a day.  Cholesterol: less than _________mg a day.  Fiber: ______g a day. What are tips for following this plan? Meal planning  At meals, divide your plate into four equal parts: ? Fill one-half of your plate with vegetables and green salads. ? Fill one-fourth of your plate with whole grains. ? Fill one-fourth of your plate with low-fat (lean) protein foods.  Eat fish that is high in omega-3 fats at least two times a week. This includes mackerel, tuna, sardines, and salmon.  Eat foods that are high in fiber, such as whole grains, beans, apples, broccoli, carrots, peas, and barley. General tips   Work with your doctor to lose weight if you need to.   Avoid: ? Foods with added sugar. ? Fried foods. ? Foods with partially hydrogenated oils.  Limit alcohol intake to no more than 1 drink a day for nonpregnant women and 2 drinks a day for men. One drink equals 12 oz of beer, 5 oz of wine, or 1 oz of hard liquor. Reading food labels  Check food labels for: ? Trans fats. ? Partially hydrogenated oils. ? Saturated fat (g) in each serving. ? Cholesterol (mg) in each serving. ? Fiber (g) in each serving.  Choose foods with healthy fats, such as: ? Monounsaturated fats. ? Polyunsaturated fats. ? Omega-3 fats.  Choose grain products that have whole grains. Look for the word "whole" as the first word in the ingredient list. Cooking  Cook foods using low-fat methods. These include baking, boiling, grilling, and broiling.  Eat more home-cooked foods. Eat at restaurants and buffets less often.  Avoid cooking using saturated fats, such as butter, cream, palm oil, palm kernel oil, and coconut oil. Recommended foods  Fruits  All fresh, canned (in natural juice), or frozen fruits. Vegetables  Fresh or frozen vegetables (raw, steamed, roasted, or grilled). Green salads. Grains  Whole grains, such as whole wheat or whole grain breads, crackers, cereals, and pasta. Unsweetened oatmeal, bulgur, barley, quinoa, or brown rice. Corn or whole wheat flour tortillas. Meats and other protein foods  Ground beef (85% or leaner), grass-fed beef, or beef trimmed of fat. Skinless chicken or [place]. Ground chicken or  [place]. Pork trimmed of fat. All fish and seafood. Egg whites. Dried beans, peas, or lentils. Unsalted nuts or seeds. Unsalted canned beans. Nut butters without added sugar or oil. Dairy  Low-fat or nonfat dairy products, such as skim or 1% milk, 2% or reduced-fat cheeses, low-fat and fat-free ricotta or cottage cheese, or plain low-fat and nonfat yogurt. Fats and oils  Tub margarine without trans fats. Light or reduced-fat mayonnaise and  salad dressings. Avocado. Olive, canola, sesame, or safflower oils. The items listed above may not be a complete list of foods and beverages you can eat. Contact a dietitian for more information. Foods to avoid Fruits  Canned fruit in heavy syrup. Fruit in cream or butter sauce. Fried fruit. Vegetables  Vegetables cooked in cheese, cream, or butter sauce. Fried vegetables. Grains  White bread. White pasta. White rice. Cornbread. Bagels, pastries, and croissants. Crackers and snack foods that contain trans fat and hydrogenated oils. Meats and other protein foods  Fatty cuts of meat. Ribs, chicken wings, bacon, sausage, bologna, salami, chitterlings, fatback, hot dogs, bratwurst, and packaged lunch meats. Liver and organ meats. Whole eggs and egg yolks. Chicken and [place] with skin. Fried meat. Dairy  Whole or 2% milk, cream, half-and-half, and cream cheese. Whole milk cheeses. Whole-fat or sweetened yogurt. Full-fat cheeses. Nondairy creamers and whipped toppings. Processed cheese, cheese spreads, and cheese curds. Beverages  Alcohol. Sugar-sweetened drinks such as sodas, lemonade, and fruit drinks. Fats and oils  Butter, stick margarine, lard, shortening, ghee, or bacon fat. Coconut, palm kernel, and palm oils. Sweets and desserts  Corn syrup, sugars, honey, and molasses. Candy. Jam and jelly. Syrup. Sweetened cereals. Cookies, pies, cakes, donuts, muffins, and ice cream. The items listed above may not be a complete list of foods and beverages you should avoid. Contact a dietitian for more information. Summary  Choosing the right foods helps keep your fat and cholesterol at normal levels. This can keep you from getting certain diseases.  At meals, fill one-half of your plate with vegetables and green salads.  Eat high-fiber foods, like whole grains, beans, apples, carrots, peas, and barley.  Limit added sugar, saturated fats, alcohol, and fried foods. This information is not  intended to replace advice given to you by your health care provider. Make sure you discuss any questions you have with your health care provider. Document Released: 01/04/2012 Document Revised: 03/08/2018 Document Reviewed: 03/22/2017 Elsevier Patient Education  2020 [employer].

## 2019-06-06 ENCOUNTER — Other Ambulatory Visit: Payer: Self-pay | Admitting: Adult Health

## 2019-06-06 DIAGNOSIS — G4700 Insomnia, unspecified: Secondary | ICD-10-CM

## 2019-06-29 ENCOUNTER — Other Ambulatory Visit: Payer: Self-pay | Admitting: Cardiovascular Disease

## 2019-07-03 ENCOUNTER — Encounter: Payer: Self-pay | Admitting: Internal Medicine

## 2019-07-03 ENCOUNTER — Ambulatory Visit (INDEPENDENT_AMBULATORY_CARE_PROVIDER_SITE_OTHER): Payer: Medicare Other | Admitting: Internal Medicine

## 2019-07-03 ENCOUNTER — Other Ambulatory Visit: Payer: Self-pay

## 2019-07-03 VITALS — BP 136/90 | HR 68 | Temp 97.5°F | Resp 16 | Ht 65.0 in | Wt 224.8 lb

## 2019-07-03 DIAGNOSIS — Z79899 Other long term (current) drug therapy: Secondary | ICD-10-CM

## 2019-07-03 DIAGNOSIS — E782 Mixed hyperlipidemia: Secondary | ICD-10-CM

## 2019-07-03 DIAGNOSIS — E559 Vitamin D deficiency, unspecified: Secondary | ICD-10-CM | POA: Diagnosis not present

## 2019-07-03 DIAGNOSIS — Z136 Encounter for screening for cardiovascular disorders: Secondary | ICD-10-CM

## 2019-07-03 DIAGNOSIS — Z8249 Family history of ischemic heart disease and other diseases of the circulatory system: Secondary | ICD-10-CM

## 2019-07-03 DIAGNOSIS — R7303 Prediabetes: Secondary | ICD-10-CM

## 2019-07-03 DIAGNOSIS — Z6841 Body Mass Index (BMI) 40.0 and over, adult: Secondary | ICD-10-CM

## 2019-07-03 DIAGNOSIS — R7309 Other abnormal glucose: Secondary | ICD-10-CM

## 2019-07-03 DIAGNOSIS — K219 Gastro-esophageal reflux disease without esophagitis: Secondary | ICD-10-CM

## 2019-07-03 DIAGNOSIS — Z1211 Encounter for screening for malignant neoplasm of colon: Secondary | ICD-10-CM

## 2019-07-03 DIAGNOSIS — I10 Essential (primary) hypertension: Secondary | ICD-10-CM

## 2019-07-03 DIAGNOSIS — Z87891 Personal history of nicotine dependence: Secondary | ICD-10-CM

## 2019-07-03 NOTE — Progress Notes (Signed)
Comprehensive Evaluation &  Examination     This very nice 68 y.o. MWF presents for a  comprehensive evaluation and management of multiple medical co-morbidities.  Patient has been followed for HTN, HLD, Prediabetes  and Vitamin D Deficiency. Patient has GERD controlled on her meds.      HTN predates circa 2011. Patient had a Ngative /Normal Heart Cath in 2009 and also more recently recent Ht Cath on Oct 23rd showing non obstructive CAD. Patient's BP has been controlled at home and patient denies any cardiac symptoms as chest pain, palpitations, shortness of breath, dizziness or ankle swelling. Today's BP is high normal at 136/90.      Patient's hyperlipidemia is controlled with diet and medications. Patient denies myalgias or other medication SE's. Last lipids were at goal:  Lab Results  Component Value Date   CHOL 148 12/20/2018   HDL 59 12/20/2018   LDLCALC 74 12/20/2018   TRIG 72 12/20/2018   CHOLHDL 2.5 12/20/2018      Patient has hx/o prediabetes (A1c 5.7%  / 2015)  and patient denies reactive hypoglycemic symptoms, visual blurring, diabetic polys or paresthesias. Last A1c was Normal & at goal:  Lab Results  Component Value Date   HGBA1C 5.5 12/20/2018      Finally, patient has history of Vitamin D Deficiency and last Vitamin D was at goal:  Lab Results  Component Value Date   VD25OH 76 06/08/2018   Current Outpatient Medications on File Prior to Visit  Medication Sig  . aspirin 81 MG tablet Take 81 mg by mouth daily.  . calcipotriene (DOVONOX) 0.005 % ointment Apply 1 application topically as needed (Hands).  . cephALEXin (KEFLEX) 500 MG capsule Take 500 mg by mouth as needed.  . cetirizine (ZYRTEC) 10 MG tablet Take 10 mg by mouth daily.  . Cholecalciferol (VITAMIN D3) 5000 UNITS CAPS Take 5,000 Units by mouth 4 (four) times a week. Takes M,W,TH,F  . diazepam (VALIUM) 10 MG tablet Take 1/2-1 tablet at Bedtime ONLY if needed for Sleep &  limit to 5 days /week to avoid  addiction (Patient taking differently: Take 5-10 mg by mouth daily as needed for anxiety or sleep. )  . ezetimibe (ZETIA) 10 MG tablet Take 1 tablet (10 mg total) by mouth daily.  . fluticasone (FLONASE) 50 MCG/ACT nasal spray Place 1 spray into both nostrils daily as needed for allergies or rhinitis.  . Magnesium 250 MG TABS Take 250 mg by mouth daily.  . Multiple Vitamins-Minerals (MULTIVITAMIN WITH MINERALS) tablet Take 1 tablet by mouth daily.  . naproxen sodium (ALEVE) 220 MG tablet Take 440 mg by mouth as needed (Knees).  . nitroGLYCERIN (NITROSTAT) 0.4 MG SL tablet PLACE 1 TABLET (0.4 MG TOTAL) UNDER THE TONGUE EVERY 5 (FIVE) MINUTES AS NEEDED FOR CHEST PAIN.  Marland Kitchen oxyCODONE-acetaminophen (PERCOCET/ROXICET) 5-325 MG tablet Take 1 tablet by mouth every 4 (four) hours as needed for moderate pain or severe pain (Knee pain).  . pantoprazole (PROTONIX) 40 MG tablet Take 1 tablet Daily to Prevent Indigestion & Heartburn (Patient taking differently: Take 40 mg by mouth daily. Prevent Indigestion & Heartburn)  . rosuvastatin (CRESTOR) 40 MG tablet Take 1 tablet daily for Cholesterol (Patient taking differently: Take 20 mg by mouth See admin instructions. Take four times a week)  . triamterene-hydrochlorothiazide (MAXZIDE-25) 37.5-25 MG tablet Take 1 tablet by mouth at bedtime.  Marland Kitchen zolpidem (AMBIEN) 5 MG tablet TAKE 1/2 TO 1 TABLET BY MOUTH AT BEDTIME ONE HOUR BEFORE  SLEEP   No current facility-administered medications on file prior to visit.   Allergies  Allergen Reactions  . Doxycycline Other (See Comments)    Jittery and causes nausea  . Levaquin [Levofloxacin In D5w]     yeast  . Oruvail [Ketoprofen]     GI upset   Past Medical History:  Diagnosis Date  . ANAL FISSURE, HX OF 11/06/2007   Qualifier: Diagnosis of  By: Misty StanleyLewellyn CMA (AAMA), Marchelle FolksAmanda    . Anxiety   . Depression   . Difficulty sleeping    takes Ambien  . Diverticulosis 5/1/5  . DJD (degenerative joint disease)   . Family  history of anesthesia complication    "mother quit breathing" 30 yrs ago pt does not know any more details. Pt has never had any problems with anesthesia herself  . GERD (gastroesophageal reflux disease)   . Hyperlipidemia   . Hypertension   . Obese   . Peripheral edema   . Psoriasis    HANDS  . Psoriasis   . 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 Unspecified vitamin D deficiency   . Urgency of urination    Health Maintenance  Topic Date Due  . TETANUS/TDAP  05/20/2019  . Hepatitis C Screening  05/20/2020 (Originally 07/29/1950)  . MAMMOGRAM  01/17/2020  . COLONOSCOPY  11/17/2023  . INFLUENZA VACCINE  Completed  . DEXA SCAN  Completed  . PNA vac Low Risk Adult  Completed   Immunization History  Administered Date(s) Administered  . Fluad Quad(high Dose 65+) 04/02/2019  . Influenza Inj Mdck Quad With Preservative 05/17/2017  . Influenza Split 06/25/2014, 05/08/2015  . Influenza, High Dose Seasonal PF 05/09/2018  . Influenza,inj,quad, With Preservative 04/13/2016  . Influenza-Unspecified 05/28/2013  . PPD Test 08/29/2013  . Pneumococcal Conjugate-13 06/08/2017  . Pneumococcal Polysaccharide-23 06/07/2013, 06/08/2018  . Tdap 05/19/2009  . Zoster 06/25/2014   Last Colon -  11/16/2013 - Dr Lina Sarora Brodie - recc 10 year f/u due May 2025  Last MGM - 01/17/2019  Past Surgical History:  Procedure Laterality Date  . ABDOMINAL HYSTERECTOMY     partial  . APPENDECTOMY    . CARDIAC CATHETERIZATION  2005    no problems per pt   . KNEE ARTHROSCOPY     RT 2011, LT 2009  . LEFT HEART CATH AND CORONARY ANGIOGRAPHY N/A 05/11/2019   Procedure: LEFT HEART CATH AND CORONARY ANGIOGRAPHY;  Surgeon: Iran OuchArida, Muhammad A, MD;  Location: [facility];  Service: Cardiovascular;  Laterality: N/A;  . TONSILLECTOMY AND ADENOIDECTOMY    . TOTAL KNEE ARTHROPLASTY Right 02/28/2014   Procedure: RIGHT TOTAL KNEE ARTHROPLASTY;  Surgeon: Javier DockerJeffrey C Beane, MD;  Location: [facility];  Service: Orthopedics;  Laterality:  Right;  . TUBAL LIGATION     Family History  Problem Relation Age of Onset  . Stroke Mother   . Hyperlipidemia Mother   . Heart attack Mother   . Cancer Brother [id number]       prostate, deceased  . Heart disease Paternal Grandfather   . Heart attack Maternal Grandfather   . Diabetes Paternal Uncle   . Colon cancer Neg Hx   . Breast cancer Neg Hx    Social History   Tobacco Use  . Smoking status: Former Smoker    Quit date: 07/19/2000    Years since quitting: 18.9  . Smokeless tobacco: Never Used  Substance Use Topics  . Alcohol use: Yes    Comment: occ  . Drug use: No  ROS Constitutional: Denies fever, chills, weight loss/gain, headaches, insomnia,  night sweats, and change in appetite. Does c/o fatigue. Eyes: Denies redness, blurred vision, diplopia, discharge, itchy, watery eyes.  ENT: Denies discharge, congestion, post nasal drip, epistaxis, sore throat, earache, hearing loss, dental pain, Tinnitus, Vertigo, Sinus pain, snoring.  Cardio: Denies chest pain, palpitations, irregular heartbeat, syncope, dyspnea, diaphoresis, orthopnea, PND, claudication, edema Respiratory: denies cough, dyspnea, DOE, pleurisy, hoarseness, laryngitis, wheezing.  Gastrointestinal: Denies dysphagia, heartburn, reflux, water brash, pain, cramps, nausea, vomiting, bloating, diarrhea, constipation, hematemesis, melena, hematochezia, jaundice, hemorrhoids Genitourinary: Denies dysuria, frequency, urgency, nocturia, hesitancy, discharge, hematuria, flank pain Breast: Breast lumps, nipple discharge, bleeding.  Musculoskeletal: Denies arthralgia, myalgia, stiffness, Jt. Swelling, pain, limp, and strain/sprain. Denies falls. Skin: Denies puritis, rash, hives, warts, acne, eczema, changing in skin lesion Neuro: No weakness, tremor, incoordination, spasms, paresthesia, pain Psychiatric: Denies confusion, memory loss, sensory loss. Denies Depression. Endocrine: Denies change in weight, skin, hair change,  nocturia, and paresthesia, diabetic polys, visual blurring, hyper / hypo glycemic episodes.  Heme/Lymph: No excessive bleeding, bruising, enlarged lymph nodes.  Physical Exam  BP 136/90   Pulse 68   Temp (!) 97.5 F (36.4 C)   Resp 16   Ht 5\' 5"  (1.651 m)   Wt 224 lb 12.8 oz (102 kg)   BMI 37.41 kg/m   General Appearance: Over nourished, well groomed and in no apparent distress.  Eyes: PERRLA, EOMs, conjunctiva no swelling or erythema, normal fundi and vessels. Sinuses: No frontal/maxillary tenderness ENT/Mouth: EACs patent / TMs  nl. Nares clear without erythema, swelling, mucoid exudates. Oral hygiene is good. No erythema, swelling, or exudate. Tongue normal, non-obstructing. Tonsils not swollen or erythematous. Hearing normal.  Neck: Supple, thyroid not palpable. No bruits, nodes or JVD. Respiratory: Respiratory effort normal.  BS equal and clear bilateral without rales, rhonci, wheezing or stridor. Cardio: Heart sounds are normal with regular rate and rhythm and no murmurs, rubs or gallops. Peripheral pulses are normal and equal bilaterally without edema. No aortic or femoral bruits. Chest: symmetric with normal excursions and percussion. Breasts: Symmetric, without lumps, nipple discharge, retractions, or fibrocystic changes.  Abdomen: rotund, soft with bowel sounds active. Nontender, no guarding, rebound, hernias, masses, or organomegaly.  Lymphatics: Non tender without lymphadenopathy.  Genitourinary:  Musculoskeletal: Full ROM all peripheral extremities, joint stability, 5/5 strength, and normal gait. Skin: Warm and dry without rashes, lesions, cyanosis, clubbing or  ecchymosis.  Neuro: Cranial nerves intact, reflexes equal bilaterally. Normal muscle tone, no cerebellar symptoms. Sensation intact.  Pysch: Alert and oriented X 3, normal affect, Insight and Judgment appropriate.   Assessment and Plan  1. Essential hypertension  - EKG 12-Lead - Urinalysis, Routine w reflex  microscopic - CBC with Diff - COMPLETE METABOLIC PANEL WITH GFR - Magnesium - TSH - Microalbumin / Creatinine Urine Ratio  2. Hyperlipidemia, mixed  - EKG 12-Lead - Lipid Profile - TSH  3. Abnormal glucose  - EKG 12-Lead - Hemoglobin A1c ([facility]) - Insulin, random  4. Vitamin D deficiency  - Vitamin D (25 hydroxy)  5. Prediabetes  - EKG 12-Lead  6. Class 3 severe obesity due to excess calories with serious comorbidity  and body mass index (BMI) of 40.0 to 44.9 in adult (HCC)   7. Screening for colorectal cancer  - POC Hemoccult Bld/Stl  8. Screening for ischemic heart disease  - EKG 12-Lead  9. FHx: heart disease  - EKG 12-Lead  10. Former smoker  - EKG 12-Lead  11. Medication management  -  Urinalysis, Routine w reflex microscopic - CBC with Diff - COMPLETE METABOLIC PANEL WITH GFR - Magnesium - Lipid Profile - TSH - Hemoglobin A1c ([facility]) - Insulin, random - Vitamin D (25 hydroxy) - Microalbumin / Creatinine Urine Ratio           Patient was counseled in prudent diet to achieve/maintain BMI less than 25 for weight control, BP monitoring, regular exercise and medications. Discussed med's effects and SE's. Screening labs and tests as requested with regular follow-up as recommended. Over 40 minutes of exam, counseling, chart review and high complex critical decision making was performed.   Marinus Maw, MD

## 2019-07-03 NOTE — Patient Instructions (Signed)
Vit D  & Vit C 1,000 mg   are recommended to help protect  against the Covid-19 and other Corona viruses.    Also it's recommended  to take  Zinc 50 mg  to help  protect against the Covid-19   and [facility] to get  is also on [email or web address]  and don't pay more than 6-8 cents /pill !  ================================ Coronavirus (COVID-19) Are you at risk?  Are you at risk for the Coronavirus (COVID-19)?  To be considered HIGH RISK for Coronavirus (COVID-19), you have to meet the following criteria:  . Traveled to [place], [place], [place], [place] or [place]; or in the [place] to [place], [place], [place]  . or [place]; and have fever, cough, and shortness of breath within the last 2 weeks of travel OR . Been in close contact with a person diagnosed with COVID-19 within the last 2 weeks and have  . fever, cough,and shortness of breath .  . IF YOU DO NOT MEET THESE CRITERIA, YOU ARE CONSIDERED LOW RISK FOR COVID-19.  What to do if you are HIGH RISK for COVID-19?  Marland Kitchen If you are having a medical emergency, call 911. . Seek medical care right away. Before you go to a doctor's office, urgent care or emergency department, .  call ahead and tell them about your recent travel, contact with someone diagnosed with COVID-19  .  and your symptoms.  . You should receive instructions from your physician's office regarding next steps of care.  . When you arrive at healthcare provider, tell the healthcare staff immediately you have returned from  . visiting [place], [place], [place], [place] or [place]; or traveled in the [place] to [place], [place],  . [place] or [place] in the last two weeks or you have been in close contact with a person diagnosed with  . COVID-19 in the last 2 weeks.   . Tell the health care staff about your symptoms: fever, cough and shortness of breath. . After you have been seen by a medical provider, you will be either: o Tested for (COVID-19) and  discharged home on quarantine except to seek medical care if  o symptoms worsen, and asked to  - Stay home and avoid contact with others until you get your results (4-5 days)  - Avoid travel on public transportation if possible (such as bus, train, or airplane) or o Sent to the Emergency Department by EMS for evaluation, COVID-19 testing  and  o possible admission depending on your condition and test results.  What to do if you are LOW RISK for COVID-19?  Reduce your risk of any infection by using the same precautions used for avoiding the common cold or flu:  Marland Kitchen Wash your hands often with soap and warm water for at least 20 seconds.  If soap and water are not readily available,  . use an alcohol-based hand sanitizer with at least 60% alcohol.  . If coughing or sneezing, cover your mouth and nose by coughing or sneezing into the elbow areas of your shirt or coat, .  into a tissue or into your sleeve (not your hands). . Avoid shaking hands with others and consider head nods or verbal greetings only. . Avoid touching your eyes, nose, or mouth with unwashed hands.  . Avoid close contact with people who are sick. . Avoid places or events with large numbers of people in one location, like concerts or sporting events. Marland Kitchen  Carefully consider travel plans you have or are making. . If you are planning any travel outside or inside the [place], visit the CDC's Travelers' Health webpage for the latest health notices. . If you have some symptoms but not all symptoms, continue to monitor at home and seek medical attention  . if your symptoms worsen. . If you are having a medical emergency, call 911.   . >>>>>>>>>>>>>>>>>>>>>>>>>>>>>>>>> . We Do NOT Approve of  [facility], [employer]  Our Patients  To Do Home Visits & We Do NOT Approve of [employer] > > > > > > > > > > > > > > > > > > > > > > > > > > > > > > > > > > > > > > >  Preventive Care for Adults  A healthy lifestyle  and preventive care can promote health and wellness. Preventive health guidelines for women include the following key practices.  A routine yearly physical is a good way to check with your health care provider about your health and preventive screening. It is a chance to share any concerns and updates on your health and to receive a thorough exam.  Visit your dentist for a routine exam and preventive care every 6 months. Brush your teeth twice a day and floss once a day. Good oral hygiene prevents tooth decay and gum disease.  The frequency of eye exams is based on your age, health, family medical history, use of contact lenses, and other factors. Follow your health care provider's recommendations for frequency of eye exams.  Eat a healthy diet. Foods like vegetables, fruits, whole grains, low-fat dairy products, and lean protein foods contain the nutrients you need without too many calories. Decrease your intake of foods high in solid fats, added sugars, and salt. Eat the right amount of calories for you. Get information about a proper diet from your health care provider, if necessary.  Regular physical exercise is one of the most important things you can do for your health. Most adults should get at least 150 minutes of moderate-intensity exercise (any activity that increases your heart rate and causes you to sweat) each week. In addition, most adults need muscle-strengthening exercises on 2 or more days a week.  Maintain a healthy weight. The body mass index (BMI) is a screening tool to identify possible weight problems. It provides an estimate of body fat based on height and weight. Your health care provider can find your BMI and can help you achieve or maintain a healthy weight. For adults 20 years and older:  A BMI below 18.5 is considered underweight.  A BMI of 18.5 to 24.9 is normal.  A BMI of 25 to 29.9 is considered overweight.  A BMI of 30 and above is considered obese.  Maintain  normal blood lipids and cholesterol levels by exercising and minimizing your intake of saturated fat. Eat a balanced diet with plenty of fruit and vegetables. If your lipid or cholesterol levels are high, you are over 50, or you are at high risk for heart disease, you may need your cholesterol levels checked more frequently. Ongoing high lipid and cholesterol levels should be treated with medicines if diet and exercise are not working.  If you smoke, find out from your health care provider how to quit. If you do not use tobacco, do not start.  Lung cancer screening is recommended for adults aged 34-80 years who are at high risk for developing lung cancer  because of a history of smoking. A yearly low-dose CT scan of the lungs is recommended for people who have at least a 30-pack-year history of smoking and are a current smoker or have quit within the past 15 years. A pack year of smoking is smoking an average of 1 pack of cigarettes a day for 1 year (for example: 1 pack a day for 30 years or 2 packs a day for 15 years). Yearly screening should continue until the smoker has stopped smoking for at least 15 years. Yearly screening should be stopped for people who develop a health problem that would prevent them from having lung cancer treatment.  Avoid use of street drugs. Do not share needles with anyone. Ask for help if you need support or instructions about stopping the use of drugs.  High blood pressure causes heart disease and increases the risk of stroke.  Ongoing high blood pressure should be treated with medicines if weight loss and exercise do not work.  If you are 66-18 years old, ask your health care provider if you should take aspirin to prevent strokes.  Diabetes screening involves taking a blood sample to check your fasting blood sugar level. This should be done once every 3 years, after age 35, if you are within normal weight and without risk factors for diabetes. Testing should be considered  at a younger age or be carried out more frequently if you are overweight and have at least 1 risk factor for diabetes.  Breast cancer screening is essential preventive care for women. You should practice "breast self-awareness." This means understanding the normal appearance and feel of your breasts and may include breast self-examination. Any changes detected, no matter how small, should be reported to a health care provider. Women in their 74s and 30s should have a clinical breast exam (CBE) by a health care provider as part of a regular health exam every 1 to 3 years. After age 5, women should have a CBE every year. Starting at age 52, women should consider having a mammogram (breast X-ray test) every year. Women who have a family history of breast cancer should talk to their health care provider about genetic screening. Women at a high risk of breast cancer should talk to their health care providers about having an MRI and a mammogram every year.  Breast cancer gene (BRCA)-related cancer risk assessment is recommended for women who have family members with BRCA-related cancers. BRCA-related cancers include breast, ovarian, tubal, and peritoneal cancers. Having family members with these cancers may be associated with an increased risk for harmful changes (mutations) in the breast cancer genes BRCA1 and BRCA2. Results of the assessment will determine the need for genetic counseling and BRCA1 and BRCA2 testing.  Routine pelvic exams to screen for cancer are no longer recommended for nonpregnant women who are considered low risk for cancer of the pelvic organs (ovaries, uterus, and vagina) and who do not have symptoms. Ask your health care provider if a screening pelvic exam is right for you.  If you have had past treatment for cervical cancer or a condition that could lead to cancer, you need Pap tests and screening for cancer for at least 20 years after your treatment. If Pap tests have been discontinued,  your risk factors (such as having a new sexual partner) need to be reassessed to determine if screening should be resumed. Some women have medical problems that increase the chance of getting cervical cancer. In these cases, your health care  provider may recommend more frequent screening and Pap tests.    Colorectal cancer can be detected and often prevented. Most routine colorectal cancer screening begins at the age of 12 years and continues through age 30 years. However, your health care provider may recommend screening at an earlier age if you have risk factors for colon cancer. On a yearly basis, your health care provider may provide home test kits to check for hidden blood in the stool. Use of a small camera at the end of a tube, to directly examine the colon (sigmoidoscopy or colonoscopy), can detect the earliest forms of colorectal cancer. Talk to your health care provider about this at age 20, when routine screening begins.  Direct exam of the colon should be repeated every 5-10 years through age 68 years, unless early forms of pre-cancerous polyps or small growths are found.  Osteoporosis is a disease in which the bones lose minerals and strength with aging. This can result in serious bone fractures or breaks. The risk of osteoporosis can be identified using a bone density scan. Women ages 11 years and over and women at risk for fractures or osteoporosis should discuss screening with their health care providers. Ask your health care provider whether you should take a calcium supplement or vitamin D to reduce the rate of osteoporosis.  Menopause can be associated with physical symptoms and risks. Hormone replacement therapy is available to decrease symptoms and risks. You should talk to your health care provider about whether hormone replacement therapy is right for you.  Use sunscreen. Apply sunscreen liberally and repeatedly throughout the day. You should seek shade when your shadow is shorter  than you. Protect yourself by wearing long sleeves, pants, a wide-brimmed hat, and sunglasses year round, whenever you are outdoors.  Once a month, do a whole body skin exam, using a mirror to look at the skin on your back. Tell your health care provider of new moles, moles that have irregular borders, moles that are larger than a pencil eraser, or moles that have changed in shape or color.  Stay current with required vaccines (immunizations).  Influenza vaccine. All adults should be immunized every year.  Tetanus, diphtheria, and acellular pertussis (Td, Tdap) vaccine. Pregnant women should receive 1 dose of Tdap vaccine during each pregnancy. The dose should be obtained regardless of the length of time since the last dose. Immunization is preferred during the 27th-36th week of gestation. An adult who has not previously received Tdap or who does not know her vaccine status should receive 1 dose of Tdap. This initial dose should be followed by tetanus and diphtheria toxoids (Td) booster doses every 10 years. Adults with an unknown or incomplete history of completing a 3-dose immunization series with Td-containing vaccines should begin or complete a primary immunization series including a Tdap dose. Adults should receive a Td booster every 10 years.    Zoster vaccine. One dose is recommended for adults aged 25 years or older unless certain conditions are present.    Pneumococcal 13-valent conjugate (PCV13) vaccine. When indicated, a person who is uncertain of her immunization history and has no record of immunization should receive the PCV13 vaccine. An adult aged 77 years or older who has certain medical conditions and has not been previously immunized should receive 1 dose of PCV13 vaccine. This PCV13 should be followed with a dose of pneumococcal polysaccharide (PPSV23) vaccine. The PPSV23 vaccine dose should be obtained at least 1 or more year(s) after the dose of  PCV13 vaccine. An adult aged 68  years or older who has certain medical conditions and previously received 1 or more doses of PPSV23 vaccine should receive 1 dose of PCV13. The PCV13 vaccine dose should be obtained 1 or more years after the last PPSV23 vaccine dose.    Pneumococcal polysaccharide (PPSV23) vaccine. When PCV13 is also indicated, PCV13 should be obtained first. All adults aged 61 years and older should be immunized. An adult younger than age 57 years who has certain medical conditions should be immunized. Any person who resides in a nursing home or long-term care facility should be immunized. An adult smoker should be immunized. People with an immunocompromised condition and certain other conditions should receive both PCV13 and PPSV23 vaccines. People with human immunodeficiency virus (HIV) infection should be immunized as soon as possible after diagnosis. Immunization during chemotherapy or radiation therapy should be avoided. Routine use of PPSV23 vaccine is not recommended for American Indians, [place], or people younger than 65 years unless there are medical conditions that require PPSV23 vaccine. When indicated, people who have unknown immunization and have no record of immunization should receive PPSV23 vaccine. One-time revaccination 5 years after the first dose of PPSV23 is recommended for people aged 19-64 years who have chronic kidney failure, nephrotic syndrome, asplenia, or immunocompromised conditions. People who received 1-2 doses of PPSV23 before age 30 years should receive another dose of PPSV23 vaccine at age 85 years or later if at least 5 years have passed since the previous dose. Doses of PPSV23 are not needed for people immunized with PPSV23 at or after age 57 years.   Preventive Services / Frequency  Ages 77 years and over  Blood pressure check.  Lipid and cholesterol check.  Lung cancer screening. / Every year if you are aged 69-80 years and have a 30-pack-year history of smoking and  currently smoke or have quit within the past 15 years. Yearly screening is stopped once you have quit smoking for at least 15 years or develop a health problem that would prevent you from having lung cancer treatment.  Clinical breast exam.** / Every year after age 1 years.   BRCA-related cancer risk assessment.** / For women who have family members with a BRCA-related cancer (breast, ovarian, tubal, or peritoneal cancers).  Mammogram.** / Every year beginning at age 74 years and continuing for as long as you are in good health. Consult with your health care provider.  Pap test.** / Every 3 years starting at age 59 years through age 36 or 61 years with 3 consecutive normal Pap tests. Testing can be stopped between 65 and 70 years with 3 consecutive normal Pap tests and no abnormal Pap or HPV tests in the past 10 years.  Fecal occult blood test (FOBT) of stool. / Every year beginning at age 69 years and continuing until age 70 years. You may not need to do this test if you get a colonoscopy every 10 years.  Flexible sigmoidoscopy or colonoscopy.** / Every 5 years for a flexible sigmoidoscopy or every 10 years for a colonoscopy beginning at age 58 years and continuing until age 64 years.  Hepatitis C blood test.** / For all people born from [birth year] through 1964 and any individual with known risks for hepatitis C.  Osteoporosis screening.** / A one-time screening for women ages 70 years and over and women at risk for fractures or osteoporosis.  Skin self-exam. / Monthly.  Influenza vaccine. / Every year.  Tetanus, diphtheria, and acellular  pertussis (Tdap/Td) vaccine.** / 1 dose of Td every 10 years.  Zoster vaccine.** / 1 dose for adults aged 60 years or older.  Pneumococcal 13-valent conjugate (PCV13) vaccine.** / Consult your health care provider.  Pneumococcal polysaccharide (PPSV23) vaccine.** / 1 dose for all adults aged 65 years and older. Screening for abdominal aortic aneurysm (AAA)   by ultrasound is recommended for people who have history of high blood pressure or who are current or former smokers. ++++++++++++++++++++ Recommend Adult Low Dose Aspirin or  coated  Aspirin 81 mg daily  To reduce risk of Colon Cancer 40 %,  Skin Cancer 26 % ,  Melanoma 46%  and  Pancreatic cancer 60% ++++++++++++++++++++ Vitamin D goal  is between 70-100.  Please make sure that you are taking your Vitamin D as directed.  It is very important as a natural anti-inflammatory  helping hair, skin, and nails, as well as reducing stroke and heart attack risk.  It helps your bones and helps with mood. It also decreases numerous cancer risks so please take it as directed.  Low Vit D is associated with a 200-300% higher risk for CANCER  and 200-300% higher risk for HEART   ATTACK  &  STROKE.   ...................................... It is also associated with higher death rate at younger ages,  autoimmune diseases like Rheumatoid arthritis, Lupus, Multiple Sclerosis.    Also many other serious conditions, like depression, Alzheimer's Dementia, infertility, muscle aches, fatigue, fibromyalgia - just to name a few. ++++++++++++++++++ Recommend the book "The END of DIETING" by Dr Joel Fuhrman  & the book "The END of DIABETES " by Dr Joel Fuhrman At [employer] - get book & Audio CD's    Being diabetic has a  300% increased risk for heart attack, stroke, cancer, and alzheimer- type vascular dementia. It is very important that you work harder with diet by avoiding all foods that are white. Avoid white rice (brown & wild rice is OK), white potatoes (sweetpotatoes in moderation is OK), White bread or wheat bread or anything made out of white flour like bagels, donuts, rolls, buns, biscuits, cakes, pastries, cookies, pizza crust, and pasta (made from white flour & egg whites) - vegetarian pasta or spinach or wheat pasta is OK. Multigrain breads like Arnold's or [employer], or multigrain sandwich thins  or flatbreads.  Diet, exercise and weight loss can reverse and cure diabetes in the early stages.  Diet, exercise and weight loss is very important in the control and prevention of complications of diabetes which affects every system in your body, ie. Brain - dementia/stroke, eyes - glaucoma/blindness, heart - heart attack/heart failure, kidneys - dialysis, stomach - gastric paralysis, intestines - malabsorption, nerves - severe painful neuritis, circulation - gangrene & loss of a leg(s), and finally cancer and Alzheimers.    I recommend avoid fried & greasy foods,  sweets/candy, white rice (brown or wild rice or Quinoa is OK), white potatoes (sweet potatoes are OK) - anything made from white flour - bagels, doughnuts, rolls, buns, biscuits,white and wheat breads, pizza crust and traditional pasta made of white flour & egg white(vegetarian pasta or spinach or wheat pasta is OK).  Multi-grain bread is OK - like multi-grain flat bread or sandwich thins. Avoid alcohol in excess. Exercise is also important.    Eat all the vegetables you want - avoid meat, especially red meat and dairy - especially cheese.  Cheese is the most concentrated form of trans-fats which is the worst thing to clog   up our arteries. Veggie cheese is OK which can be found in the fresh produce section at [employer] or [employer] or [employer]  +++++++++++++++++++ DASH Eating Plan  DASH stands for "Dietary Approaches to Stop Hypertension."   The DASH eating plan is a healthy eating plan that has been shown to reduce high blood pressure (hypertension). Additional health benefits may include reducing the risk of type 2 diabetes mellitus, heart disease, and stroke. The DASH eating plan may also help with weight loss. WHAT DO I NEED TO KNOW ABOUT THE DASH EATING PLAN? For the DASH eating plan, you will follow these general guidelines:  Choose foods with a percent daily value for sodium of less than 5% (as listed on the food  label).  Use salt-free seasonings or herbs instead of table salt or sea salt.  Check with your health care provider or pharmacist before using salt substitutes.  Eat lower-sodium products, often labeled as "lower sodium" or "no salt added."  Eat fresh foods.  Eat more vegetables, fruits, and low-fat dairy products.  Choose whole grains. Look for the word "whole" as the first word in the ingredient list.  Choose fish   Limit sweets, desserts, sugars, and sugary drinks.  Choose heart-healthy fats.  Eat veggie cheese   Eat more home-cooked food and less restaurant, buffet, and fast food.  Limit fried foods.  Cook foods using methods other than frying.  Limit canned vegetables. If you do use them, rinse them well to decrease the sodium.  When eating at a restaurant, ask that your food be prepared with less salt, or no salt if possible.                      WHAT FOODS CAN I EAT? Read Dr Fara Olden Fuhrman's books on The End of Dieting & The End of Diabetes  Grains Whole grain or whole wheat bread. Brown rice. Whole grain or whole wheat pasta. Quinoa, bulgur, and whole grain cereals. Low-sodium cereals. Corn or whole wheat flour tortillas. Whole grain cornbread. Whole grain crackers. Low-sodium crackers.  Vegetables Fresh or frozen vegetables (raw, steamed, roasted, or grilled). Low-sodium or reduced-sodium tomato and vegetable juices. Low-sodium or reduced-sodium tomato sauce and paste. Low-sodium or reduced-sodium canned vegetables.   Fruits All fresh, canned (in natural juice), or frozen fruits.  Protein Products  All fish and seafood.  Dried beans, peas, or lentils. Unsalted nuts and seeds. Unsalted canned beans.  Dairy Low-fat dairy products, such as skim or 1% milk, 2% or reduced-fat cheeses, low-fat ricotta or cottage cheese, or plain low-fat yogurt. Low-sodium or reduced-sodium cheeses.  Fats and Oils Tub margarines without trans fats. Light or reduced-fat mayonnaise  and salad dressings (reduced sodium). Avocado. Safflower, olive, or canola oils. Natural peanut or almond butter.  Other Unsalted popcorn and pretzels. The items listed above may not be a complete list of recommended foods or beverages. Contact your dietitian for more options.  +++++++++++++++  WHAT FOODS ARE NOT RECOMMENDED? Grains/ White flour or wheat flour White bread. White pasta. White rice. Refined cornbread. Bagels and croissants. Crackers that contain trans fat.  Vegetables  Creamed or fried vegetables. Vegetables in a . Regular canned vegetables. Regular canned tomato sauce and paste. Regular tomato and vegetable juices.  Fruits Dried fruits. Canned fruit in light or heavy syrup. Fruit juice.  Meat and Other Protein Products Meat in general - RED meat & White meat.  Fatty cuts of meat. Ribs, chicken wings, all processed meats as  bacon, sausage, bologna, salami, fatback, hot dogs, bratwurst and packaged luncheon meats.  Dairy Whole or 2% milk, cream, half-and-half, and cream cheese. Whole-fat or sweetened yogurt. Full-fat cheeses or blue cheese. Non-dairy creamers and whipped toppings. Processed cheese, cheese spreads, or cheese curds.  Condiments Onion and garlic salt, seasoned salt, table salt, and sea salt. Canned and packaged gravies. Worcestershire sauce. Tartar sauce. Barbecue sauce. Teriyaki sauce. Soy sauce, including reduced sodium. Steak sauce. Fish sauce. Oyster sauce. Cocktail sauce. Horseradish. Ketchup and mustard. Meat flavorings and tenderizers. Bouillon cubes. Hot sauce. Tabasco sauce. Marinades. Taco seasonings. Relishes.  Fats and Oils Butter, stick margarine, lard, shortening and bacon fat. Coconut, palm kernel, or palm oils. Regular salad dressings.  Pickles and olives. Salted popcorn and pretzels.  The items listed above may not be a complete list of foods and beverages to avoid.

## 2019-07-04 LAB — HEMOGLOBIN A1C
Hgb A1c MFr Bld: 5.7 % of total Hgb — ABNORMAL HIGH (ref <5.7)
Mean Plasma Glucose: 117 (calc)
eAG (mmol/L): 6.5 (calc)

## 2019-07-04 LAB — COMPLETE METABOLIC PANEL WITHOUT GFR
AG Ratio: 2.1 (calc) (ref 1.0–2.5)
ALT: 23 U/L (ref 6–29)
AST: 17 U/L (ref 10–35)
Albumin: 4.1 g/dL (ref 3.6–5.1)
Alkaline phosphatase (APISO): 59 U/L (ref 37–153)
BUN/Creatinine Ratio: 34 (calc) — ABNORMAL HIGH (ref 6–22)
BUN: 29 mg/dL — ABNORMAL HIGH (ref 7–25)
CO2: 27 mmol/L (ref 20–32)
Calcium: 9.2 mg/dL (ref 8.6–10.4)
Chloride: 108 mmol/L (ref 98–110)
Creat: 0.85 mg/dL (ref 0.50–0.99)
GFR, Est African American: 82 mL/min/1.73m2
GFR, Est Non African American: 71 mL/min/1.73m2
Globulin: 2 g/dL (ref 1.9–3.7)
Glucose, Bld: 77 mg/dL (ref 65–99)
Potassium: 4.4 mmol/L (ref 3.5–5.3)
Sodium: 145 mmol/L (ref 135–146)
Total Bilirubin: 0.3 mg/dL (ref 0.2–1.2)
Total Protein: 6.1 g/dL (ref 6.1–8.1)

## 2019-07-04 LAB — URINALYSIS, ROUTINE W REFLEX MICROSCOPIC
Bilirubin Urine: NEGATIVE
Glucose, UA: NEGATIVE
Hgb urine dipstick: NEGATIVE
Ketones, ur: NEGATIVE
Leukocytes,Ua: NEGATIVE
Nitrite: NEGATIVE
Protein, ur: NEGATIVE
Specific Gravity, Urine: 1.021 (ref 1.001–1.03)
pH: 6 (ref 5.0–8.0)

## 2019-07-04 LAB — CBC WITH DIFFERENTIAL/PLATELET
Absolute Monocytes: 518 {cells}/uL (ref 200–950)
Basophils Absolute: 81 {cells}/uL (ref 0–200)
Basophils Relative: 1 %
Eosinophils Absolute: 292 {cells}/uL (ref 15–500)
Eosinophils Relative: 3.6 %
HCT: 46 % — ABNORMAL HIGH (ref 35.0–45.0)
Hemoglobin: 14.8 g/dL (ref 11.7–15.5)
Lymphs Abs: 2649 {cells}/uL (ref 850–3900)
MCH: 28.4 pg (ref 27.0–33.0)
MCHC: 32.2 g/dL (ref 32.0–36.0)
MCV: 88.3 fL (ref 80.0–100.0)
MPV: 11.2 fL (ref 7.5–12.5)
Monocytes Relative: 6.4 %
Neutro Abs: 4560 {cells}/uL (ref 1500–7800)
Neutrophils Relative %: 56.3 %
Platelets: 226 Thousand/uL (ref 140–400)
RBC: 5.21 Million/uL — ABNORMAL HIGH (ref 3.80–5.10)
RDW: 12.7 % (ref 11.0–15.0)
Total Lymphocyte: 32.7 %
WBC: 8.1 Thousand/uL (ref 3.8–10.8)

## 2019-07-04 LAB — LIPID PANEL
Cholesterol: 121 mg/dL (ref <200)
HDL: 65 mg/dL (ref >=50)
LDL Cholesterol (Calc): 41 mg/dL (calc)
Non-HDL Cholesterol (Calc): 56 mg/dL (calc) (ref <130)
Total CHOL/HDL Ratio: 1.9 (calc) (ref <5.0)
Triglycerides: 71 mg/dL (ref <150)

## 2019-07-04 LAB — MICROALBUMIN / CREATININE URINE RATIO
Creatinine, Urine: 99 mg/dL (ref 20–275)
Microalb Creat Ratio: 3 mcg/mg creat (ref <30)
Microalb, Ur: 0.3 mg/dL

## 2019-07-04 LAB — MAGNESIUM: Magnesium: 2.2 mg/dL (ref 1.5–2.5)

## 2019-07-04 LAB — TSH: TSH: 2.46 mIU/L (ref 0.40–4.50)

## 2019-07-04 LAB — VITAMIN D 25 HYDROXY (VIT D DEFICIENCY, FRACTURES): Vit D, 25-Hydroxy: 65 ng/mL (ref 30–100)

## 2019-07-04 LAB — INSULIN, RANDOM: Insulin: 11.3 u[IU]/mL

## 2019-07-06 ENCOUNTER — Other Ambulatory Visit: Payer: Self-pay | Admitting: Cardiovascular Disease

## 2019-08-24 DIAGNOSIS — Z23 Encounter for immunization: Secondary | ICD-10-CM | POA: Diagnosis not present

## 2019-09-05 ENCOUNTER — Other Ambulatory Visit: Payer: Self-pay | Admitting: Internal Medicine

## 2019-09-05 DIAGNOSIS — K219 Gastro-esophageal reflux disease without esophagitis: Secondary | ICD-10-CM

## 2019-09-06 ENCOUNTER — Other Ambulatory Visit: Payer: Self-pay | Admitting: Physician Assistant

## 2019-09-06 DIAGNOSIS — G4700 Insomnia, unspecified: Secondary | ICD-10-CM

## 2019-09-22 DIAGNOSIS — Z23 Encounter for immunization: Secondary | ICD-10-CM | POA: Diagnosis not present

## 2019-10-01 ENCOUNTER — Ambulatory Visit (INDEPENDENT_AMBULATORY_CARE_PROVIDER_SITE_OTHER): Payer: Medicare Other | Admitting: Physician Assistant

## 2019-10-01 ENCOUNTER — Other Ambulatory Visit: Payer: Self-pay

## 2019-10-01 ENCOUNTER — Encounter: Payer: Self-pay | Admitting: Physician Assistant

## 2019-10-01 VITALS — BP 126/74 | HR 63 | Temp 97.3°F | Wt 229.0 lb

## 2019-10-01 DIAGNOSIS — E7800 Pure hypercholesterolemia, unspecified: Secondary | ICD-10-CM

## 2019-10-01 DIAGNOSIS — Z79899 Other long term (current) drug therapy: Secondary | ICD-10-CM

## 2019-10-01 DIAGNOSIS — E559 Vitamin D deficiency, unspecified: Secondary | ICD-10-CM

## 2019-10-01 DIAGNOSIS — R7309 Other abnormal glucose: Secondary | ICD-10-CM | POA: Diagnosis not present

## 2019-10-01 DIAGNOSIS — I10 Essential (primary) hypertension: Secondary | ICD-10-CM

## 2019-10-01 DIAGNOSIS — F3342 Major depressive disorder, recurrent, in full remission: Secondary | ICD-10-CM | POA: Diagnosis not present

## 2019-10-01 MED ORDER — BUPROPION HCL ER (XL) 150 MG PO TB24: 150.0000 mg | ORAL_TABLET | ORAL | 2 refills | Status: DC

## 2019-10-01 NOTE — Patient Instructions (Addendum)
Check out [employer] Light therapy Lamp, Ultra Thin UV Free 10000 Lux therapy Light on Amazon- 35-40 dollars.    Make sure you are on your vitamin D   Will start you on wellbutrin to try to help with mood/weight loss If you get nausea and HA after the first week please stop If you get anxious or snappy with people than stop the medication But this medication can help with energy, weight loss and mood It kicks in about 1-2 weeks And can be stopped quickly   Google mindful eating and here are some tips and tricks below.   Rate your hunger before you eat on a scale of 1-10, try to eat closer to a 6 or higher. And if you are at below that, why are you eating? Slow down and listen to your body.        General eating tips  What to Avoid . Avoid added sugars o Often added sugar can be found in processed foods such as many condiments, dry cereals, cakes, cookies, chips, crisps, crackers, candies, sweetened drinks, etc.  o Read labels and AVOID/DECREASE use of foods with the following in their ingredient list: Sugar, fructose, high fructose corn syrup, sucrose, glucose, maltose, dextrose, molasses, cane sugar, brown sugar, any type of syrup, agave nectar, etc.   . Avoid snacking in between meals- drink water or if you feel you need a snack, pick a high water content snack such as cucumbers, watermelon, or any veggie.  Marland Kitchen Avoid foods made with flour o If you are going to eat food made with flour, choose those made with whole-grains; and, minimize your consumption as much as is tolerable . Avoid processed foods o These foods are generally stocked in the middle of the grocery store.  o Focus on shopping on the perimeter of the grocery.  What to Include . Vegetables o GREEN LEAFY VEGETABLES: Kale, spinach, mustard greens, collard greens, cabbage, broccoli, etc. o OTHER: Asparagus, cauliflower, eggplant, carrots, peas, Brussel sprouts, tomatoes, bell peppers, zucchini, beets, cucumbers,  etc. . Grains, seeds, and legumes o Beans: kidney beans, black eyed peas, garbanzo beans, black beans, pinto beans, etc. o Whole, unrefined grains: brown rice, barley, bulgur, oatmeal, etc. . Healthy fats  o Avoid highly processed fats such as vegetable oil o Examples of healthy fats: avocado, olives, virgin olive oil, dark chocolate (?72% Cocoa), nuts (peanuts, almonds, walnuts, cashews, pecans, etc.) o Please still do small amount of these healthy fats, they are dense in calories.  . Low - Moderate Intake of Animal Sources of Protein o Meat sources: chicken, [place], salmon, tuna. Limit to 4 ounces of meat at one time or the size of your palm. o Consider limiting dairy sources, but when choosing dairy focus on: PLAIN [place] yogurt, cottage cheese, high-protein milk . Fruit o Choose berries

## 2019-10-01 NOTE — Progress Notes (Signed)
FOLLOW UP  Assessment and Plan:   Hypertension Well controlled with current medications  Monitor blood pressure at home; patient to call if consistently greater than 130/80 Continue DASH diet.   Reminder to go to the ER if any CP, SOB, nausea, dizziness, severe HA, changes vision/speech, left arm numbness and tingling and jaw pain.  Cholesterol Currently above goal; newly on rosuvastatin 20 mg every other day and doing well Continue low cholesterol diet and exercise.  Check lipid panel.   Other abnormal glucose Recent A1Cs at goal Discussed diet/exercise, weight management  Defer A1C; check CMP  Obesity with co morbidities Commended on progress thus far Long discussion about weight loss, diet, and exercise Recommended diet heavy in fruits and veggies and low in animal meats, cheeses, and dairy products, appropriate calorie intake Discussed ideal weight for height and initial weight goal (<200lb) Start back on weight watchers, try to increase exercise Will follow up in 3 months  Vitamin D Def At goal at last visit; continue supplementation to maintain goal of 70-100  Depression/anxiety Worsening with mom's declining health and pandemic; has benzo but uses rarely Will do trial of wellbutrin, discussed AE's to stop, info given.  Also discussed light therapy for the patient to try Lifestyle discussed: diet/exerise, sleep hygiene, stress management, hydration   Continue diet and meds as discussed. Further disposition pending results of labs. Discussed med's effects and SE's.   Over 30 minutes of exam, counseling, chart review, and critical decision making was performed.   Future Appointments  Date Time Provider Department Center  12/26/2019  3:40 PM Croitoru, Rachelle Hora, MD [facility]  01/02/2020  2:00 PM Judd Gaudier, NP [facility] None  07/25/2020 10:00 AM [name], MD [facility] None     ----------------------------------------------------------------------------------------------------------------------  HPI 69 y.o. female  presents for 3 month follow up on hypertension, cholesterol, glucose management, weight and vitamin D deficiency.   She is followed by cardiology annually, and by Dr. Jillyn Hidden for right knee arthritis s/p TKA.  she has a diagnosis of GERD which is currently managed by protonix 40 mg daily (failed transition to famotidine).  she reports symptoms is currently well controlled, and denies breakthrough reflux, burning in chest, hoarseness or cough.    she has a diagnosis of anxiety and is currently on valium 10 mg PRN, last RX was 01/2019 and last took it with chest pain episode last year,  she takes very rarely. She is prescribed ambien 5 mg daily for sleep and reports she has done very well with this without SE. Last hydrocodone RX for her knee was 10 and was 03/16/2019. Remote history of normal sleep study other than RLS.   BMI is Body mass index is 38.11 kg/m., she has been working on diet and exercise. Weight is up due to stress/anxiety, mom has dementia and cancer, mom lives next door, she goes over at least 3 days a week. She was doing weight watchers but since COVID has not done it. She admits that she has no motivation, she can not wait for it to be dark to get better.  Wt Readings from Last 3 Encounters:  10/01/19 229 lb (103.9 kg)  07/03/19 224 lb 12.8 oz (102 kg)  05/28/19 220 lb 6.4 oz (100 kg)   Her blood pressure has been controlled at home, today their BP is BP: 126/74  She does not workout. She denies chest pain, shortness of breath, dizziness.   She is on cholesterol medication, recently switched to Rosuvastatin 20 mg every  other day and denies myalgias. Her cholesterol is not at goal. The cholesterol last visit was:   Lab Results  Component Value Date   CHOL 121 07/03/2019   HDL 65 07/03/2019   LDLCALC 41 07/03/2019   TRIG 71  07/03/2019   CHOLHDL 1.9 07/03/2019    She has been working on diet and exercise for glucose management, and denies increased appetite, nausea, paresthesia of the feet, polydipsia, polyuria and visual disturbances. Last A1C in the office was:  Lab Results  Component Value Date   HGBA1C 5.7 (H) 07/03/2019   Patient is on Vitamin D supplement.   Lab Results  Component Value Date   VD25OH 65 07/03/2019        Current Medications:  Current Outpatient Medications on File Prior to Visit  Medication Sig  . aspirin 81 MG tablet Take 81 mg by mouth daily.  . calcipotriene (DOVONOX) 0.005 % ointment Apply 1 application topically as needed (Hands).  . cephALEXin (KEFLEX) 500 MG capsule Take 500 mg by mouth as needed.  . cetirizine (ZYRTEC) 10 MG tablet Take 10 mg by mouth daily.  . Cholecalciferol (VITAMIN D3) 5000 UNITS CAPS Take 5,000 Units by mouth 4 (four) times a week. Takes M,W,TH,F  . diazepam (VALIUM) 10 MG tablet Take 1/2-1 tablet at Bedtime ONLY if needed for Sleep &  limit to 5 days /week to avoid addiction (Patient taking differently: Take 5-10 mg by mouth daily as needed for anxiety or sleep. )  . fluticasone (FLONASE) 50 MCG/ACT nasal spray Place 1 spray into both nostrils daily as needed for allergies or rhinitis.  . Magnesium 250 MG TABS Take 250 mg by mouth daily.  . Multiple Vitamins-Minerals (MULTIVITAMIN WITH MINERALS) tablet Take 1 tablet by mouth daily.  . naproxen sodium (ALEVE) 220 MG tablet Take 440 mg by mouth as needed (Knees).  . nitroGLYCERIN (NITROSTAT) 0.4 MG SL tablet PLACE 1 TABLET (0.4 MG TOTAL) UNDER THE TONGUE EVERY 5 (FIVE) MINUTES AS NEEDED FOR CHEST PAIN.  Marland Kitchen oxyCODONE-acetaminophen (PERCOCET/ROXICET) 5-325 MG tablet Take 1 tablet by mouth every 4 (four) hours as needed for moderate pain or severe pain (Knee pain).  . pantoprazole (PROTONIX) 40 MG tablet TAKE 1 TABLET DAILY TO PREVENT INDIGESTION & HEARTBURN  . rosuvastatin (CRESTOR) 40 MG tablet Take 1  tablet daily for Cholesterol (Patient taking differently: Take 20 mg by mouth See admin instructions. Take four times a week)  . triamterene-hydrochlorothiazide (MAXZIDE-25) 37.5-25 MG tablet Take 1 tablet by mouth at bedtime.  Marland Kitchen zolpidem (AMBIEN) 5 MG tablet TAKE 1/2 TO 1 TABLET BY MOUTH AT BEDTIME ONE HOUR BEFORE SLEEP  . ezetimibe (ZETIA) 10 MG tablet Take 1 tablet (10 mg total) by mouth daily.   No current facility-administered medications on file prior to visit.     Allergies:  Allergies  Allergen Reactions  . Doxycycline Other (See Comments)    Jittery and causes nausea  . Levaquin [Levofloxacin In D5w]     yeast  . Oruvail [Ketoprofen]     GI upset     Medical History:  Past Medical History:  Diagnosis Date  . ANAL FISSURE, HX OF 11/06/2007   Qualifier: Diagnosis of  By: Julaine Hua CMA ([place]), Estill Bamberg    . Anxiety   . Depression   . Difficulty sleeping    takes Ambien  . Diverticulosis 5/1/5  . DJD (degenerative joint disease)   . Family history of anesthesia complication    "mother quit breathing" 30 yrs ago pt does  not know any more details. Pt has never had any problems with anesthesia herself  . GERD (gastroesophageal reflux disease)   . Hyperlipidemia   . Hypertension   . Obese   . Peripheral edema   . Psoriasis    HANDS  . Psoriasis   . 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 Unspecified vitamin D deficiency   . Urgency of urination    Family history- Reviewed and unchanged Social history- Reviewed and unchanged   Review of Systems:  Review of Systems  Constitutional: Negative for malaise/fatigue and weight loss.  HENT: Negative for hearing loss and tinnitus.   Eyes: Negative for blurred vision and double vision.  Respiratory: Negative for cough, shortness of breath and wheezing.   Cardiovascular: Negative for chest pain, palpitations, orthopnea, claudication and leg swelling.  Gastrointestinal: Negative for abdominal pain, blood in stool, constipation, diarrhea,  heartburn, melena, nausea and vomiting.  Genitourinary: Negative.   Musculoskeletal: Positive for joint pain (bilateral knees; pending L TKA). Negative for myalgias.  Skin: Negative for rash.  Neurological: Negative for dizziness, tingling, sensory change, weakness and headaches.  Endo/Heme/Allergies: Negative for polydipsia.  Psychiatric/Behavioral: Negative.   All other systems reviewed and are negative.    Physical Exam: BP 126/74   Pulse 63   Temp (!) 97.3 F (36.3 C)   Wt 229 lb (103.9 kg)   SpO2 98%   BMI 38.11 kg/m  Wt Readings from Last 3 Encounters:  10/01/19 229 lb (103.9 kg)  07/03/19 224 lb 12.8 oz (102 kg)  05/28/19 220 lb 6.4 oz (100 kg)   General Appearance: Well nourished, in no apparent distress. Eyes: PERRLA, EOMs, conjunctiva no swelling or erythema Sinuses: No Frontal/maxillary tenderness ENT/Mouth: Ext aud canals clear, TMs without erythema, bulging. No erythema, swelling, or exudate on post pharynx.  Tonsils not swollen or erythematous. Hearing normal.  Neck: Supple, thyroid normal.  Respiratory: Respiratory effort normal, BS equal bilaterally without rales, rhonchi, wheezing or stridor.  Cardio: RRR with no MRGs. Brisk peripheral pulses without edema.  Abdomen: Soft, + BS.  Non tender, no guarding, rebound, hernias, masses. Lymphatics: Non tender without lymphadenopathy.  Musculoskeletal: Full ROM, 5/5 strength, antalgic gait gait Skin: Warm, dry without rashes, lesions, ecchymosis.  Neuro: Cranial nerves intact. No cerebellar symptoms.  Psych: Awake and oriented X 3, normal affect, Insight and Judgment appropriate.    Quentin Mulling, PA-C 2:39 PM [facility]

## 2019-10-02 LAB — COMPLETE METABOLIC PANEL WITH GFR
AG Ratio: 2 (calc) (ref 1.0–2.5)
ALT: 28 U/L (ref 6–29)
AST: 19 U/L (ref 10–35)
Albumin: 3.9 g/dL (ref 3.6–5.1)
Alkaline phosphatase (APISO): 62 U/L (ref 37–153)
BUN/Creatinine Ratio: 34 (calc) — ABNORMAL HIGH (ref 6–22)
BUN: 30 mg/dL — ABNORMAL HIGH (ref 7–25)
CO2: 31 mmol/L (ref 20–32)
Calcium: 9.5 mg/dL (ref 8.6–10.4)
Chloride: 107 mmol/L (ref 98–110)
Creat: 0.88 mg/dL (ref 0.50–0.99)
GFR, Est African American: 79 mL/min/{1.73_m2} (ref >=60)
GFR, Est Non African American: 68 mL/min/{1.73_m2} (ref >=60)
Globulin: 2 g/dL (calc) (ref 1.9–3.7)
Glucose, Bld: 86 mg/dL (ref 65–99)
Potassium: 4.2 mmol/L (ref 3.5–5.3)
Sodium: 142 mmol/L (ref 135–146)
Total Bilirubin: 0.3 mg/dL (ref 0.2–1.2)
Total Protein: 5.9 g/dL — ABNORMAL LOW (ref 6.1–8.1)

## 2019-10-02 LAB — CBC WITH DIFFERENTIAL/PLATELET
Absolute Monocytes: 461 cells/uL (ref 200–950)
Basophils Absolute: 70 cells/uL (ref 0–200)
Basophils Relative: 0.8 %
Eosinophils Absolute: 270 cells/uL (ref 15–500)
Eosinophils Relative: 3.1 %
HCT: 43.9 % (ref 35.0–45.0)
Hemoglobin: 14.1 g/dL (ref 11.7–15.5)
Lymphs Abs: 2741 cells/uL (ref 850–3900)
MCH: 28.6 pg (ref 27.0–33.0)
MCHC: 32.1 g/dL (ref 32.0–36.0)
MCV: 89 fL (ref 80.0–100.0)
MPV: 11.2 fL (ref 7.5–12.5)
Monocytes Relative: 5.3 %
Neutro Abs: 5159 cells/uL (ref 1500–7800)
Neutrophils Relative %: 59.3 %
Platelets: 246 10*3/uL (ref 140–400)
RBC: 4.93 10*6/uL (ref 3.80–5.10)
RDW: 12.8 % (ref 11.0–15.0)
Total Lymphocyte: 31.5 %
WBC: 8.7 10*3/uL (ref 3.8–10.8)

## 2019-10-02 LAB — TSH: TSH: 1.88 mIU/L (ref 0.40–4.50)

## 2019-10-02 LAB — LIPID PANEL
Cholesterol: 110 mg/dL (ref <200)
HDL: 54 mg/dL (ref >=50)
LDL Cholesterol (Calc): 41 mg/dL (calc)
Non-HDL Cholesterol (Calc): 56 mg/dL (calc) (ref <130)
Total CHOL/HDL Ratio: 2 (calc) (ref <5.0)
Triglycerides: 70 mg/dL (ref <150)

## 2019-10-02 LAB — MAGNESIUM: Magnesium: 1.9 mg/dL (ref 1.5–2.5)

## 2019-10-02 LAB — HEMOGLOBIN A1C
Hgb A1c MFr Bld: 5.8 % of total Hgb — ABNORMAL HIGH (ref <5.7)
Mean Plasma Glucose: 120 (calc)
eAG (mmol/L): 6.6 (calc)

## 2019-10-02 LAB — VITAMIN D 25 HYDROXY (VIT D DEFICIENCY, FRACTURES): Vit D, 25-Hydroxy: 70 ng/mL (ref 30–100)

## 2019-10-02 MED ORDER — BUPROPION HCL ER (XL) 150 MG PO TB24: 150.0000 mg | ORAL_TABLET | ORAL | 2 refills | Status: DC

## 2019-10-25 ENCOUNTER — Other Ambulatory Visit: Payer: Self-pay

## 2019-10-25 DIAGNOSIS — Z1211 Encounter for screening for malignant neoplasm of colon: Secondary | ICD-10-CM | POA: Diagnosis not present

## 2019-10-25 DIAGNOSIS — Z1212 Encounter for screening for malignant neoplasm of rectum: Secondary | ICD-10-CM | POA: Diagnosis not present

## 2019-10-25 LAB — POC HEMOCCULT BLD/STL (HOME/3-CARD/SCREEN)
Card #2 Fecal Occult Blod, POC: NEGATIVE
Card #3 Fecal Occult Blood, POC: NEGATIVE
Fecal Occult Blood, POC: NEGATIVE

## 2019-12-03 ENCOUNTER — Other Ambulatory Visit: Payer: Self-pay | Admitting: Adult Health

## 2019-12-03 DIAGNOSIS — G4700 Insomnia, unspecified: Secondary | ICD-10-CM

## 2019-12-07 ENCOUNTER — Other Ambulatory Visit: Payer: Self-pay | Admitting: Internal Medicine

## 2019-12-07 DIAGNOSIS — Z1231 Encounter for screening mammogram for malignant neoplasm of breast: Secondary | ICD-10-CM

## 2019-12-26 ENCOUNTER — Other Ambulatory Visit: Payer: Self-pay

## 2019-12-26 ENCOUNTER — Encounter: Payer: Self-pay | Admitting: Cardiovascular Disease

## 2019-12-26 ENCOUNTER — Ambulatory Visit (INDEPENDENT_AMBULATORY_CARE_PROVIDER_SITE_OTHER): Payer: Medicare Other | Admitting: Cardiovascular Disease

## 2019-12-26 VITALS — BP 162/78 | HR 94 | Ht 65.0 in | Wt 229.0 lb

## 2019-12-26 DIAGNOSIS — Z6837 Body mass index (BMI) 37.0-37.9, adult: Secondary | ICD-10-CM | POA: Diagnosis not present

## 2019-12-26 DIAGNOSIS — K219 Gastro-esophageal reflux disease without esophagitis: Secondary | ICD-10-CM | POA: Diagnosis not present

## 2019-12-26 DIAGNOSIS — I10 Essential (primary) hypertension: Secondary | ICD-10-CM | POA: Diagnosis not present

## 2019-12-26 DIAGNOSIS — E7800 Pure hypercholesterolemia, unspecified: Secondary | ICD-10-CM | POA: Diagnosis not present

## 2019-12-26 DIAGNOSIS — I25118 Atherosclerotic heart disease of native coronary artery with other forms of angina pectoris: Secondary | ICD-10-CM

## 2019-12-26 MED ORDER — EZETIMIBE 10 MG PO TABS: 10.0000 mg | ORAL_TABLET | Freq: Every day | ORAL | 3 refills | Status: DC

## 2019-12-26 NOTE — Progress Notes (Signed)
Cardiology Office Note    Date:  12/28/2019   ID:  Jade Singh, DOB February 08, 1951, MRN [id number]  PCP:  [name], MD  Cardiologist:   Thurmon Fair, MD   Chief Complaint  Patient presents with  . Chest Pain    History of Present Illness:  Jade Singh is a 69 y.o. female with moderate CAD, hypertension, hyperlipidemia, obesity.  She continues to have occasional episodes of chest discomfort that respond to nitroglycerin.  As before, her symptoms are usually triggered by stress.  She continues to care for her elderly mother and has an uncle dying from cancer for which she feels responsible.  About 2 months ago she last took nitroglycerin, which relieved the chest pressure in about 2 or 3 minutes.  Otherwise, she is able to perform intense physical activity without any complaints of dyspnea or angina.  In October 2020 she underwent a coronary CT angiogram.  She has a very high calcium score of 598 (96th percentile).  She had several moderate stenoses including 50% in the left main, 50-69% in the proximal LAD, 40-59% in the left circumflex.  However by fractional flow reserve the only significant stenosis was in the distal LAD (FFR 0.75).  Cardiac catheterization performed in October 2020 showed "minimal luminal irregularities, no obstructive CAD".  She has an excellent LDL cholesterol on treatment with rosuvastatin and usually well-controlled blood pressure.  Her blood pressure is a little high today, but typically at home it is in the low 130s/70s.  Past Medical History:  Diagnosis Date  . ANAL FISSURE, HX OF 11/06/2007   Qualifier: Diagnosis of  By: Misty Stanley CMA (AAMA), Marchelle Folks    . Anxiety   . Depression   . Difficulty sleeping    takes Ambien  . Diverticulosis 5/1/5  . DJD (degenerative joint disease)   . Family history of anesthesia complication    "mother quit breathing" 30 yrs ago pt does not know any more details. Pt has never had any problems  with anesthesia herself  . GERD (gastroesophageal reflux disease)   . Hyperlipidemia   . Hypertension   . Obese   . Peripheral edema   . Psoriasis    HANDS  . Psoriasis   . 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 Unspecified vitamin D deficiency   . Urgency of urination     Past Surgical History:  Procedure Laterality Date  . ABDOMINAL HYSTERECTOMY     partial  . APPENDECTOMY    . CARDIAC CATHETERIZATION  2005    no problems per pt   . KNEE ARTHROSCOPY     RT 2011, LT 2009  . LEFT HEART CATH AND CORONARY ANGIOGRAPHY N/A 05/11/2019   Procedure: LEFT HEART CATH AND CORONARY ANGIOGRAPHY;  Surgeon: Iran Ouch, MD;  Location: [facility];  Service: Cardiovascular;  Laterality: N/A;  . TONSILLECTOMY AND ADENOIDECTOMY    . TOTAL KNEE ARTHROPLASTY Right 02/28/2014   Procedure: RIGHT TOTAL KNEE ARTHROPLASTY;  Surgeon: Javier Docker, MD;  Location: [facility];  Service: Orthopedics;  Laterality: Right;  . TUBAL LIGATION      Current Medications: Outpatient Medications Prior to Visit  Medication Sig Dispense Refill  . aspirin 81 MG tablet Take 81 mg by mouth daily.    Marland Kitchen buPROPion (WELLBUTRIN XL) 150 MG 24 hr tablet Take 1 tablet (150 mg total) by mouth every morning. 90 tablet 2  . calcipotriene (DOVONOX) 0.005 % ointment Apply 1 application topically as needed (Hands).    Marland Kitchen  cephALEXin (KEFLEX) 500 MG capsule Take 500 mg by mouth as needed.    . cetirizine (ZYRTEC) 10 MG tablet Take 10 mg by mouth daily.    . Cholecalciferol (VITAMIN D3) 5000 UNITS CAPS Take 5,000 Units by mouth 4 (four) times a week. Takes M,W,TH,F    . diazepam (VALIUM) 10 MG tablet Take 1/2-1 tablet at Bedtime ONLY if needed for Sleep &  limit to 5 days /week to avoid addiction (Patient taking differently: Take 5-10 mg by mouth daily as needed for anxiety or sleep. ) 90 tablet 0  . fluticasone (FLONASE) 50 MCG/ACT nasal spray Place 1 spray into both nostrils daily as needed for allergies or rhinitis.    . Magnesium  250 MG TABS Take 250 mg by mouth daily.    . Multiple Vitamins-Minerals (MULTIVITAMIN WITH MINERALS) tablet Take 1 tablet by mouth daily.    . naproxen sodium (ALEVE) 220 MG tablet Take 440 mg by mouth as needed (Knees).    Marland Kitchen oxyCODONE-acetaminophen (PERCOCET/ROXICET) 5-325 MG tablet Take 1 tablet by mouth every 4 (four) hours as needed for moderate pain or severe pain (Knee pain).    . pantoprazole (PROTONIX) 40 MG tablet TAKE 1 TABLET DAILY TO PREVENT INDIGESTION & HEARTBURN 90 tablet 1  . rosuvastatin (CRESTOR) 40 MG tablet Take 1 tablet daily for Cholesterol (Patient taking differently: Take 20 mg by mouth See admin instructions. Take four times a week) 90 tablet 3  . triamterene-hydrochlorothiazide (MAXZIDE-25) 37.5-25 MG tablet Take 1 tablet by mouth at bedtime. 90 tablet 3  . zolpidem (AMBIEN) 5 MG tablet TAKE 1/2 TO 1 TABLET BY MOUTH AT BEDTIME ONE HOUR BEFORE SLEEP 30 tablet 2  . ezetimibe (ZETIA) 10 MG tablet Take 1 tablet (10 mg total) by mouth daily. 90 tablet 3  . nitroGLYCERIN (NITROSTAT) 0.4 MG SL tablet PLACE 1 TABLET (0.4 MG TOTAL) UNDER THE TONGUE EVERY 5 (FIVE) MINUTES AS NEEDED FOR CHEST PAIN. 75 tablet 1   No facility-administered medications prior to visit.     Allergies:   Doxycycline, Levaquin [levofloxacin in d5w], and Oruvail [ketoprofen]   Social History   Socioeconomic History  . Marital status: Married    Spouse name: Not on file  . Number of children: Not on file  . Years of education: Not on file  . Highest education level: Not on file  Occupational History  . Not on file  Tobacco Use  . Smoking status: Former Smoker    Quit date: 07/19/2000    Years since quitting: 19.4  . Smokeless tobacco: Never Used  Substance and Sexual Activity  . Alcohol use: Yes    Comment: occ  . Drug use: No  . Sexual activity: Not on file  Other Topics Concern  . Not on file  Social History Narrative  . Not on file   Social Determinants of Health   Financial Resource  Strain:   . Difficulty of Paying Living Expenses:   Food Insecurity:   . Worried About Programme researcher, broadcasting/film/video in the Last Year:   . Barista in the Last Year:   Transportation Needs:   . Freight forwarder (Medical):   Marland Kitchen Lack of Transportation (Non-Medical):   Physical Activity:   . Days of Exercise per Week:   . Minutes of Exercise per Session:   Stress:   . Feeling of Stress :   Social Connections:   . Frequency of Communication with Friends and Family:   . Frequency of  Social Gatherings with Friends and Family:   . Attends Religious Services:   . Active Member of Clubs or Organizations:   . Attends Archivist Meetings:   Marland Kitchen Marital Status:      Family History:  The patient's family history includes Cancer (age of onset: 84) in her brother; Diabetes in her paternal uncle; Heart attack in her maternal grandfather and mother; Heart disease in her paternal grandfather; Hyperlipidemia in her mother; Stroke in her mother.   ROS:   Please see the history of present illness.    ROS All other systems reviewed and are negative.   PHYSICAL EXAM:   VS:  BP (!) 162/78   Pulse 94   Ht 5\' 5"  (1.651 m)   Wt 229 lb (103.9 kg)   SpO2 98%   BMI 38.11 kg/m      General: Alert, oriented x3, no distress, severely obese Head: no evidence of trauma, PERRL, EOMI, no exophtalmos or lid lag, no myxedema, no xanthelasma; normal ears, nose and oropharynx Neck: normal jugular venous pulsations and no hepatojugular reflux; brisk carotid pulses without delay and no carotid bruits Chest: clear to auscultation, no signs of consolidation by percussion or palpation, normal fremitus, symmetrical and full respiratory excursions Cardiovascular: normal position and quality of the apical impulse, regular rhythm, normal first and second heart sounds, no murmurs, rubs or gallops Abdomen: no tenderness or distention, no masses by palpation, no abnormal pulsatility or arterial bruits, normal bowel  sounds, no hepatosplenomegaly Extremities: no clubbing, cyanosis or edema; 2+ radial, ulnar and brachial pulses bilaterally; 2+ right femoral, posterior tibial and dorsalis pedis pulses; 2+ left femoral, posterior tibial and dorsalis pedis pulses; no subclavian or femoral bruits Neurological: grossly nonfocal Psych: Normal mood and affect   Wt Readings from Last 3 Encounters:  12/26/19 229 lb (103.9 kg)  10/01/19 229 lb (103.9 kg)  07/03/19 224 lb 12.8 oz (102 kg)      Studies/Labs Reviewed:   EKG:  EKG is ordered today.  It is sinus rhythm normal tracing QTC 435 ms Recent Labs: 10/01/2019: ALT 28; BUN 30; Creat 0.88; Hemoglobin 14.1; Magnesium 1.9; Platelets 246; Potassium 4.2; Sodium 142; TSH 1.88   Lipid Panel    Component Value Date/Time   CHOL 110 10/01/2019 1511   TRIG 70 10/01/2019 1511   HDL 54 10/01/2019 1511   CHOLHDL 2.0 10/01/2019 1511   VLDL 20 12/02/2016 1532   LDLCALC 41 10/01/2019 1511      ASSESSMENT:    1. Coronary artery disease involving native coronary artery of native heart with other form of angina pectoris ([place])   2. Essential hypertension   3. Hypercholesterolemia   4. Gastroesophageal reflux disease, unspecified whether esophagitis present   5. Class 2 severe obesity due to excess calories with serious comorbidity and body mass index (BMI) of 37.0 to 37.9 in adult [facility])      PLAN:  In order of problems listed above:  1. CAD: It is possible that she has coronary vasospasm superimposed on fixed moderate CAD.  I recommend continued conservative management with aspirin, aggressive lipid-lowering, nitroglycerin as needed (at this point this happens a handful of times a year.  If the symptoms become more frequent would add a calcium channel blocker.  If she develops exertional symptoms can add a beta-blocker or long-acting nitrates. 2. HTN: Adequate control 3. HLP: Excellent LDL cholesterol.  Continue statin and ezetimibe 4. GERD: Esophageal reflux  and/or esophageal spasm could also explain her nonexertional pain;  differential diagnosis with coronary vasospasm 5. Obesity: Continue efforts to lose weight.     Medication Adjustments/Labs and Tests Ordered: Current medicines are reviewed at length with the patient today.  Concerns regarding medicines are outlined above.  Medication changes, Labs and Tests ordered today are listed in the Patient Instructions below. Patient Instructions  Medication Instructions:  Continue current medications  *If you need a refill on your cardiac medications before your next appointment, please call your pharmacy*   Lab Work: None Ordered   Testing/Procedures: None Ordered   Follow-Up: At [facility], you and your health needs are our priority.  As part of our continuing mission to provide you with exceptional heart care, we have created designated Provider Care Teams.  These Care Teams include your primary Cardiologist (physician) and Advanced Practice Providers (APPs -  Physician Assistants and Nurse Practitioners) who all work together to provide you with the care you need, when you need it.  We recommend signing up for the patient portal called "MyChart".  Sign up information is provided on this After Visit Summary.  MyChart is used to connect with patients for Virtual Visits (Telemedicine).  Patients are able to view lab/test results, encounter notes, upcoming appointments, etc.  Non-urgent messages can be sent to your provider as well.   To learn more about what you can do with MyChart, go to [email or web address].    Your next appointment:   1 year(s)  The format for your next appointment:   In Person  Provider:   You may see Nila Nephew, MD or one of the following Advanced Practice Providers on your designated Care Team:    Azalee Course, PA-C  Micah Flesher, [place] or   Judy Pimple, PA-C        Signed, Thurmon Fair, MD  12/28/2019 2:47 PM    [facility]  [facility]Care [address] [place], [place], [place]  [address] Phone: [phone]; Fax: [phone]

## 2019-12-26 NOTE — Patient Instructions (Signed)
Medication Instructions:  Continue current medications  *If you need a refill on your cardiac medications before your next appointment, please call your pharmacy*   Lab Work: None Ordered   Testing/Procedures: None Ordered   Follow-Up: At [facility], you and your health needs are our priority.  As part of our continuing mission to provide you with exceptional heart care, we have created designated Provider Care Teams.  These Care Teams include your primary Cardiologist (physician) and Advanced Practice Providers (APPs -  Physician Assistants and Nurse Practitioners) who all work together to provide you with the care you need, when you need it.  We recommend signing up for the patient portal called "MyChart".  Sign up information is provided on this After Visit Summary.  MyChart is used to connect with patients for Virtual Visits (Telemedicine).  Patients are able to view lab/test results, encounter notes, upcoming appointments, etc.  Non-urgent messages can be sent to your provider as well.   To learn more about what you can do with MyChart, go to [email or web address].    Your next appointment:   1 year(s)  The format for your next appointment:   In Person  Provider:   You may see Nila Nephew, MD or one of the following Advanced Practice Providers on your designated Care Team:    Azalee Course, PA-C  Micah Flesher, PA-C or   Judy Pimple, [place]

## 2019-12-28 ENCOUNTER — Encounter: Payer: Self-pay | Admitting: Cardiovascular Disease

## 2020-01-02 ENCOUNTER — Other Ambulatory Visit: Payer: Self-pay

## 2020-01-02 ENCOUNTER — Encounter: Payer: Self-pay | Admitting: Adult Health Nurse Practitioner

## 2020-01-02 ENCOUNTER — Ambulatory Visit (INDEPENDENT_AMBULATORY_CARE_PROVIDER_SITE_OTHER): Payer: Medicare Other | Admitting: Adult Health Nurse Practitioner

## 2020-01-02 ENCOUNTER — Ambulatory Visit: Payer: Medicare Other | Admitting: Adult Health

## 2020-01-02 ENCOUNTER — Ambulatory Visit
Admission: RE | Admit: 2020-01-02 | Discharge: 2020-01-02 | Disposition: A | Discharge status: Home / Self Care | Payer: Medicare Other | Source: Ambulatory Visit | Attending: Adult Health Nurse Practitioner | Admitting: Adult Health Nurse Practitioner

## 2020-01-02 VITALS — BP 156/90 | HR 76 | Temp 96.4°F | Wt 230.0 lb

## 2020-01-02 DIAGNOSIS — E2839 Other primary ovarian failure: Secondary | ICD-10-CM

## 2020-01-02 DIAGNOSIS — R0602 Shortness of breath: Secondary | ICD-10-CM | POA: Diagnosis not present

## 2020-01-02 DIAGNOSIS — E782 Mixed hyperlipidemia: Secondary | ICD-10-CM | POA: Diagnosis not present

## 2020-01-02 DIAGNOSIS — Z0000 Encounter for general adult medical examination without abnormal findings: Secondary | ICD-10-CM

## 2020-01-02 DIAGNOSIS — F411 Generalized anxiety disorder: Secondary | ICD-10-CM

## 2020-01-02 DIAGNOSIS — K219 Gastro-esophageal reflux disease without esophagitis: Secondary | ICD-10-CM | POA: Diagnosis not present

## 2020-01-02 DIAGNOSIS — I10 Essential (primary) hypertension: Secondary | ICD-10-CM

## 2020-01-02 DIAGNOSIS — Z1159 Encounter for screening for other viral diseases: Secondary | ICD-10-CM

## 2020-01-02 DIAGNOSIS — E559 Vitamin D deficiency, unspecified: Secondary | ICD-10-CM

## 2020-01-02 DIAGNOSIS — Z23 Encounter for immunization: Secondary | ICD-10-CM

## 2020-01-02 DIAGNOSIS — K589 Irritable bowel syndrome without diarrhea: Secondary | ICD-10-CM | POA: Diagnosis not present

## 2020-01-02 DIAGNOSIS — K5790 Diverticulosis of intestine, part unspecified, without perforation or abscess without bleeding: Secondary | ICD-10-CM

## 2020-01-02 DIAGNOSIS — S61412A Laceration without foreign body of left hand, initial encounter: Secondary | ICD-10-CM | POA: Diagnosis not present

## 2020-01-02 DIAGNOSIS — R7309 Other abnormal glucose: Secondary | ICD-10-CM | POA: Diagnosis not present

## 2020-01-02 DIAGNOSIS — D649 Anemia, unspecified: Secondary | ICD-10-CM | POA: Diagnosis not present

## 2020-01-02 DIAGNOSIS — F3342 Major depressive disorder, recurrent, in full remission: Secondary | ICD-10-CM

## 2020-01-02 DIAGNOSIS — J984 Other disorders of lung: Secondary | ICD-10-CM | POA: Diagnosis not present

## 2020-01-02 DIAGNOSIS — Z79899 Other long term (current) drug therapy: Secondary | ICD-10-CM

## 2020-01-02 NOTE — Patient Instructions (Addendum)
Continue to monitor blood pressure.  Watch the sodoum, salt, content in foods.  Too much salt can drive up blood pressure.   We have placed an order for a chest Xray.  INFORMATION ABOUT YOUR XRAY [facility] Can walk into [address] building for an Personal assistant. They will have the order and take you back. You do not any paper work, I should get the result back today or tomorrow. This order is good for a year.  Can call [phone] to schedule an appointment if you wish.     Ask insurance and pharmacy about shingrix - it is a 2 part shot that we will not be getting in the office.   Suggest getting AFTER covid vaccines, have to wait at least a month This shot can make you feel bad due to such good immune response it can trigger some inflammation so take tylenol or aleve day of or day after and plan on resting.   Can go to [email or web address] for more information  Shingrix Vaccination  Two vaccines are licensed and recommended to prevent shingles in the [place] Zoster vaccine live (ZVL, Zostavax) has been in use since 2006. Recombinant zoster vaccine (RZV, Shingrix), has been in use since 2017 and is recommended by ACIP as the preferred shingles vaccine.  What Everyone Should Know about Shingles Vaccine (Shingrix) One of the Recommended Vaccines by Disease Shingles vaccination is the only way to protect against shingles and postherpetic neuralgia (PHN), the most common complication from shingles. CDC recommends that healthy adults 50 years and older get two doses of the shingles vaccine called Shingrix (recombinant zoster vaccine), separated by 2 to 6 months, to prevent shingles and the complications from the disease. Your doctor or pharmacist can give you Shingrix as a shot in your upper arm. Shingrix provides strong protection against shingles and PHN. Two doses of Shingrix is more than 90% effective at preventing shingles and PHN.  Protection stays above 85% for at least the first four years after you get vaccinated. Shingrix is the preferred vaccine, over Zostavax (zoster vaccine live), a shingles vaccine in use since 2006. Zostavax may still be used to prevent shingles in healthy adults 60 years and older. For example, you could use Zostavax if a person is allergic to Shingrix, prefers Zostavax, or requests immediate vaccination and Shingrix is unavailable. Who Should Get Shingrix? Healthy adults 50 years and older should get two doses of Shingrix, separated by 2 to 6 months. You should get Shingrix even if in the past you . had shingles  . received Zostavax  . are not sure if you had chickenpox There is no maximum age for getting Shingrix. If you had shingles in the past, you can get Shingrix to help prevent future occurrences of the disease. There is no specific length of time that you need to wait after having shingles before you can receive Shingrix, but generally you should make sure the shingles rash has gone away before getting vaccinated. You can get Shingrix whether or not you remember having had chickenpox in the past. Studies show that more than 99% of Americans 40 years and older have had chickenpox, even if they don't remember having the disease. Chickenpox and shingles are related because they are caused by the same virus (varicella zoster virus). After a person recovers from chickenpox, the virus stays dormant (inactive) in the body. It can reactivate years later and cause shingles. If you had Zostavax in the recent past, you should wait at least  eight weeks before getting Shingrix. Talk to your healthcare provider to determine the Franchi time to get Shingrix. Shingrix is available in [employer] and pharmacies. To find doctor's offices or pharmacies near you that offer the vaccine, visit HealthMap Vaccine FinderExternal. If you have questions about Shingrix, talk with your healthcare provider. Vaccine for  Those 50 Years and Older  Shingrix reduces the risk of shingles and PHN by more than 90% in people 67 and older. CDC recommends the vaccine for healthy adults 50 and older.  Who Should Not Get Shingrix? You should not get Shingrix if you: . have ever had a severe allergic reaction to any component of the vaccine or after a dose of Shingrix  . tested negative for immunity to varicella zoster virus. If you test negative, you should get chickenpox vaccine.  . currently have shingles  . currently are pregnant or breastfeeding. Women who are pregnant or breastfeeding should wait to get Shingrix.  Marland Kitchen receive specific antiviral drugs (acyclovir, famciclovir, or valacyclovir) 24 hours before vaccination (avoid use of these antiviral drugs for 14 days after vaccination)- zoster vaccine live only If you have a minor acute (starts suddenly) illness, such as a cold, you may get Shingrix. But if you have a moderate or severe acute illness, you should usually wait until you recover before getting the vaccine. This includes anyone with a temperature of 101.8F or higher. The side effects of the Shingrix are temporary, and usually last 2 to 3 days. While you may experience pain for a few days after getting Shingrix, the pain will be less severe than having shingles and the complications from the disease. How Well Does Shingrix Work? Two doses of Shingrix provides strong protection against shingles and postherpetic neuralgia (PHN), the most common complication of shingles. . In adults 17 to 68 years old who got two doses, Shingrix was 97% effective in preventing shingles; among adults 70 years and older, Shingrix was 91% effective.  . In adults 25 to 68 years old who got two doses, Shingrix was 91% effective in preventing PHN; among adults 70 years and older, Shingrix was 89% effective. Shingrix protection remained high (more than 85%) in people 70 years and older throughout the four years following vaccination. Since  your risk of shingles and PHN increases as you get older, it is important to have strong protection against shingles in your older years. Top of Page  What Are the Possible Side Effects of Shingrix? Studies show that Shingrix is safe. The vaccine helps your body create a strong defense against shingles. As a result, you are likely to have temporary side effects from getting the shots. The side effects may affect your ability to do normal daily activities for 2 to 3 days. Most people got a sore arm with mild or moderate pain after getting Shingrix, and some also had redness and swelling where they got the shot. Some people felt tired, had muscle pain, a headache, shivering, fever, stomach pain, or nausea. About 1 out of 6 people who got Shingrix experienced side effects that prevented them from doing regular activities. Symptoms went away on their own in about 2 to 3 days. Side effects were more common in younger people. You might have a reaction to the first or second dose of Shingrix, or both doses. If you experience side effects, you may choose to take over-the-counter pain medicine such as ibuprofen or acetaminophen. If you experience side effects from Shingrix, you should report them to the Vaccine  Adverse Event Reporting System (VAERS). Your doctor might file this report, or you can do it yourself through the VAERS websiteExternal, or by calling [phone]. If you have any questions about side effects from Shingrix, talk with your doctor. The shingles vaccine does not contain thimerosal (a preservative containing mercury). Top of Page  When Should I See a Doctor Because of the Side Effects I Experience From Shingrix? In clinical trials, Shingrix was not associated with serious adverse events. In fact, serious side effects from vaccines are extremely rare. For example, for every 1 million doses of a vaccine given, only one or two people may have a severe allergic reaction. Signs of an allergic  reaction happen within minutes or hours after vaccination and include hives, swelling of the face and throat, difficulty breathing, a fast heartbeat, dizziness, or weakness. If you experience these or any other life-threatening symptoms, see a doctor right away. Shingrix causes a strong response in your immune system, so it may produce short-term side effects more intense than you are used to from other vaccines. These side effects can be uncomfortable, but they are expected and usually go away on their own in 2 or 3 days. Top of Page  How Can I Pay For Shingrix? There are several ways shingles vaccine may be paid for: Medicare . Medicare Part D plans cover the shingles vaccine, but there may be a cost to you depending on your plan. There may be a copay for the vaccine, or you may need to pay in full then get reimbursed for a certain amount.  . Medicare Part B does not cover the shingles vaccine. Medicaid . Medicaid may or may not cover the vaccine. Contact your insurer to find out. Private health insurance . Many private health insurance plans will cover the vaccine, but there may be a cost to you depending on your plan. Contact your insurer to find out. Vaccine assistance programs . Some pharmaceutical companies provide vaccines to eligible adults who cannot afford them. You may want to check with the vaccine manufacturer, [employer], about Shingrix. If you do not currently have health insurance, learn more about affordable health coverage optionsExternal. To find doctor's offices or pharmacies near you that offer the vaccine, visit HealthMap Vaccine FinderExternal.

## 2020-01-02 NOTE — Progress Notes (Signed)
MEDICARE ANNUAL WELLNESS VISIT AND FOLLOW UP  Assessment:   Encounter for Annual Medicare visit Yearly  Essential hypertension Continue medications: maxzide Follows with Cardiology Monitor blood pressure at home; call if consistently over 130/80 Continue DASH diet.   Reminder to go to the ER if any CP, SOB, nausea, dizziness, severe HA, changes vision/speech, left arm numbness and tingling and jaw pain. Followed by cardiology as well  Irritable bowel syndrome, unspecified type Avoid triggers, add soluble fiber if not taking regularly  Gastroesophageal reflux disease, esophagitis presence not specified Well managed on current medications: pantropazole Discussed diet, avoiding triggers and other lifestyle changes  Diverticulosis Increase soluble fiber, UTD on colonoscopies Doing well today  Vitamin D deficiency Continue supplementation Check vitamin D level  Prediabetes Discussed disease and risks Discussed diet/exercise, weight management  -A1C  Hypercholesterolemia Continue medications:Zetia10mg  and rosuvastatin 40mg  Continue low cholesterol diet and exercise.  -Lipids  Recurrent major depressive disorder, in full remission (HCC) Continue medications: Wellbutrin XL 150mg  daily Lifestyle discussed: diet/exerise, sleep hygiene, stress management, hydration  Anxiety state Well managed by current regimen;  uses benzo rarely Stress management techniques discussed, increase water, good sleep hygiene discussed, increase exercise, and increase veggies.   Anemia, unspecified type -CBC  Morbid obesity, BMI 35 Long discussion about weight loss, diet, and exercise Recommended diet heavy in fruits and veggies and low in animal meats, cheeses, and dairy products, appropriate calorie intake Patient will work on restarting weight watchers Discussed appropriate weight for height  Follow up at next visit  Laceration of left hand Discussed keeping clean and dry Discussed  S&S of infection Due for tetanus vaccination  Need to Td vaccination administered today Monitor hand Discussed S&S of infection  Medication management Continued  Over 40 minutes of face to face exam, counseling, chart review and critical decision making was performed.  Future Appointments  Date Time Provider Department Center  01/22/2020 10:20 AM [facility]  07/25/2020 10:00 AM 03/24/2020, MD [facility] None     Plan:   During the course of the visit the patient was educated and counseled about appropriate screening and preventive services including:    Pneumococcal vaccine   Prevnar 13  Influenza vaccine  Td vaccine  Screening electrocardiogram  Bone densitometry screening  Colorectal cancer screening  Diabetes screening  Glaucoma screening  Nutrition counseling   Advanced directives: requested   Subjective:  Jade Singh is a 69 y.o. female who presents for Medicare Annual Wellness Visit and 3 month follow up.   She has history of HTN, HLD, GERD, anxiety and depression, vitamin D defciency, abnormal glucose and weight management.   Mother has cancer, lives next to her and she is helping up, some stress trying to care and coordinate.  She reports that two days ago she was going into her mother house and cut her left hand on the metal door.  Her last tetanus vaccination was 2010.  She is followed by cardiology annually, Dr 79 and last OV was 12/26/19.  She was having elevated blood pressure at hat time.  She is taking maxzide 37.5-25mg  daily in the morning.  She has been checking her blood pressure and recordings this for the past two weeks.  She reports her readings range from 140's to 150's over 70's-90's and her pulse 70-100's at times.  She was instructed to continue checking this for the next two weeks and contact Dr Royann Shivers with readings. Likely will need additional agent, elevated today.  Discussed  modifiable risk  factors, dietary and exercise modifications.  Last OV Was having exertional dyspnea, saw cardiology with normal coronary arteries, has since lost weight and reports symptoms much improved. Since then she report she continued to have exertional shortness of breath but recovers after resting.  She denies any wheezing,  She does report she has lots of cats.  She reports she has an allergy but just deals with it and takes zyrtec daily.  In office today she has expiratory wheezes on right side.  Last Chest X-ray 2018, will place order for this today.  She has been cleared regarding cardiac origins.  She has a diagnosis ofn depression anxiety and is currently on valium 10 mg PRN, reports symptoms are well controlled on current regimen. she takes very rarely, last was several months ago. She is prescribed ambien 5 mg daily for sleep and reports she has done very well with this without SE.   BMI is Body mass index is 38.27 kg/m., she has been working on diet and exercise. She is doing weight watchers, weight loss progress is slower since covid 19 and not going to meetings. She is down 28 lb since October. She drinks 65+ fluid ounces of water daily  Wt Readings from Last 3 Encounters:  01/02/20 230 lb (104.3 kg)  12/26/19 229 lb (103.9 kg)  10/01/19 229 lb (103.9 kg)   Her blood pressure has been controlled at home (130s/70s), today their BP is BP: (!) 156/90  She does workout. She denies chest pain, shortness of breath, dizziness.   She is on cholesterol medication (rosuvastatin 20 mg MWFSat) and denies myalgias. Her cholesterol is at goal. The cholesterol last visit was:   Lab Results  Component Value Date   CHOL 110 10/01/2019   HDL 54 10/01/2019   LDLCALC 41 10/01/2019   TRIG 70 10/01/2019   CHOLHDL 2.0 10/01/2019    She has been working on diet and exercise for prediabetes, and denies increased appetite, nausea, paresthesia of the feet, polydipsia, polyuria and visual disturbances. Last A1C in  the office was:  Lab Results  Component Value Date   HGBA1C 5.8 (H) 10/01/2019   Patient is on Vitamin D supplement.   Lab Results  Component Value Date   VD25OH 70 10/01/2019       Medication Review: Current Outpatient Medications on File Prior to Visit  Medication Sig Dispense Refill  . aspirin 81 MG tablet Take 81 mg by mouth daily.    Marland Kitchen buPROPion (WELLBUTRIN XL) 150 MG 24 hr tablet Take 1 tablet (150 mg total) by mouth every morning. 90 tablet 2  . calcipotriene (DOVONOX) 0.005 % ointment Apply 1 application topically as needed (Hands).    . cephALEXin (KEFLEX) 500 MG capsule Take 500 mg by mouth as needed.    . cetirizine (ZYRTEC) 10 MG tablet Take 10 mg by mouth daily.    . Cholecalciferol (VITAMIN D3) 5000 UNITS CAPS Take 5,000 Units by mouth 4 (four) times a week. Takes M,W,TH,F    . diazepam (VALIUM) 10 MG tablet Take 1/2-1 tablet at Bedtime ONLY if needed for Sleep &  limit to 5 days /week to avoid addiction (Patient taking differently: Take 5-10 mg by mouth daily as needed for anxiety or sleep. ) 90 tablet 0  . ezetimibe (ZETIA) 10 MG tablet Take 1 tablet (10 mg total) by mouth daily. 90 tablet 3  . fluticasone (FLONASE) 50 MCG/ACT nasal spray Place 1 spray into both nostrils daily as needed for  allergies or rhinitis.    . Magnesium 250 MG TABS Take 250 mg by mouth daily.    . Multiple Vitamins-Minerals (MULTIVITAMIN WITH MINERALS) tablet Take 1 tablet by mouth daily.    . naproxen sodium (ALEVE) 220 MG tablet Take 440 mg by mouth as needed (Knees).    . pantoprazole (PROTONIX) 40 MG tablet TAKE 1 TABLET DAILY TO PREVENT INDIGESTION & HEARTBURN 90 tablet 1  . rosuvastatin (CRESTOR) 40 MG tablet Take 1 tablet daily for Cholesterol (Patient taking differently: Take 20 mg by mouth See admin instructions. Take four times a week) 90 tablet 3  . triamterene-hydrochlorothiazide (MAXZIDE-25) 37.5-25 MG tablet Take 1 tablet by mouth at bedtime. 90 tablet 3  . zolpidem (AMBIEN) 5 MG  tablet TAKE 1/2 TO 1 TABLET BY MOUTH AT BEDTIME ONE HOUR BEFORE SLEEP 30 tablet 2  . nitroGLYCERIN (NITROSTAT) 0.4 MG SL tablet PLACE 1 TABLET (0.4 MG TOTAL) UNDER THE TONGUE EVERY 5 (FIVE) MINUTES AS NEEDED FOR CHEST PAIN. 75 tablet 1  . oxyCODONE-acetaminophen (PERCOCET/ROXICET) 5-325 MG tablet Take 1 tablet by mouth every 4 (four) hours as needed for moderate pain or severe pain (Knee pain). (Patient not taking: Reported on 01/02/2020)     No current facility-administered medications on file prior to visit.    Allergies  Allergen Reactions  . Doxycycline Other (See Comments)    Jittery and causes nausea  . Levaquin [Levofloxacin In D5w]     yeast  . Oruvail [Ketoprofen]     GI upset    Current Problems (verified) Patient Active Problem List   Diagnosis Date Noted  . Chest pain   . Normal coronary arteries 05/04/2017  . Diverticulosis   . Other abnormal glucose 06/25/2014  . Vitamin D deficiency   . Hypertension   . Hypercholesterolemia   . GERD (gastroesophageal reflux disease)   . Morbid obesity (HCC)   . Anxiety state 11/06/2007  . Depression 11/06/2007  . IBS 11/06/2007    Screening Tests Immunization History  Administered Date(s) Administered  . Fluad Quad(high Dose 65+) 04/02/2019  . Influenza Inj Mdck Quad With Preservative 05/17/2017  . Influenza Split 06/25/2014, 05/08/2015  . Influenza, High Dose Seasonal PF 05/09/2018  . Influenza,inj,quad, With Preservative 04/13/2016  . Influenza-Unspecified 05/28/2013  . Moderna SARS-COVID-2 Vaccination 08/24/2019, 09/22/2019  . PPD Test 08/29/2013  . Pneumococcal Conjugate-13 06/08/2017  . Pneumococcal Polysaccharide-23 06/07/2013, 06/08/2018  . Tdap 05/19/2009  . Zoster 06/25/2014   Tetanus: 2010 due, cut on metal door, administer today. Pneumovax: 2014, 2019  Prevnar 13: 2018  Flu vaccine: 2019 Zostavax: 2015  Pap: S/p hysterectomy MGM: 01/2020 DEXA: 04/2019 Colonoscopy: 2015 EGD: -   Last Eye Exam: [facility], 01/2020 Last Dental Exam: [facility] 2021 q87months  Patient Care Team: [name], MD as PCP - General (Internal Medicine) Thurmon Fair, MD as Consulting Physician (Cardiology) Jene Every, MD as Consulting Physician (Orthopedic Surgery)  SURGICAL HISTORY She  has a past surgical history that includes Knee arthroscopy; Appendectomy; Tubal ligation; Abdominal hysterectomy; Tonsillectomy and adenoidectomy; Cardiac catheterization (2005); Total knee arthroplasty (Right, 02/28/2014); and LEFT HEART CATH AND CORONARY ANGIOGRAPHY (N/A, 05/11/2019). FAMILY HISTORY Her family history includes Cancer (age of onset: 36) in her brother; Diabetes in her paternal uncle; Heart attack in her maternal grandfather and mother; Heart disease in her paternal grandfather; Hyperlipidemia in her mother; Stroke in her mother. SOCIAL HISTORY She  reports that she quit smoking about 19 years ago. She has never used smokeless tobacco. She reports  current alcohol use. She reports that she does not use drugs.   MEDICARE WELLNESS OBJECTIVES: Physical activity: Current Exercise Habits: The patient does not participate in regular exercise at present, Exercise limited by: respiratory conditions(s) Cardiac risk factors: Cardiac Risk Factors include: advanced age (>6755men, 32>65 women);dyslipidemia;hypertension;sedentary lifestyle;obesity (BMI >30kg/m2) Depression/mood screen:   Dep[facility] screen [facility] 01/02/2020  Decreased Interest 0  Down, Depressed, Hopeless 0  PHQ - 2 Score 0  Altered sleeping -  Tired, decreased energy -  Change in appetite -  Feeling bad or failure about yourself  -  Trouble concentrating -  Moving slowly or fidgety/restless -  Suicidal thoughts -  PHQ-9 Score -  Difficult doing work/chores -    ADLs:  In your present state of health, do you have any difficulty performing the following activities: 01/02/2020 07/03/2019  Hearing? N N  Vision? N N  Difficulty  concentrating or making decisions? N N  Walking or climbing stairs? N N  Dressing or bathing? N N  Doing errands, shopping? N N  Preparing Food and eating ? N -  Using the Toilet? N -  In the past six months, have you accidently leaked urine? N -  Do you have problems with loss of bowel control? N -  Managing your Medications? N -  Managing your Finances? N -  Some recent data might be hidden     Cognitive Testing  Alert? Yes  Normal Appearance?Yes  Oriented to person? Yes  Place? Yes   Time? Yes  Recall of three objects?  Yes  Can perform simple calculations? Yes  Displays appropriate judgment?Yes  Can read the correct time from a watch face?Yes  EOL planning: Does Patient Have a Medical Advance Directive?: No Would patient like information on creating a medical advance directive?: No - Patient declined  Review of Systems  Constitutional: Negative for malaise/fatigue and weight loss.  HENT: Negative for hearing loss and tinnitus.   Eyes: Negative for blurred vision and double vision.  Respiratory: Negative for cough, sputum production, shortness of breath and wheezing.   Cardiovascular: Negative for chest pain, palpitations, orthopnea, claudication, leg swelling and PND.  Gastrointestinal: Negative for abdominal pain, blood in stool, constipation, diarrhea, heartburn, melena, nausea and vomiting.  Genitourinary: Negative.   Musculoskeletal: Negative for falls, joint pain and myalgias.  Skin: Negative for rash.  Neurological: Negative for dizziness, tingling, sensory change, weakness and headaches.  Endo/Heme/Allergies: Negative for polydipsia.  Psychiatric/Behavioral: Negative for depression, memory loss, substance abuse and suicidal ideas. The patient is not nervous/anxious and does not have insomnia (Controlled on ambien).   All other systems reviewed and are negative.    Objective:     Today's Vitals   01/02/20 1354  BP: (!) 156/90  Pulse: 76  Temp: (!) 96.4 F  (35.8 C)  SpO2: 98%  Weight: 230 lb (104.3 kg)   Body mass index is 38.27 kg/m.  General appearance: alert, no distress, WD/WN, female HEENT: normocephalic, sclerae anicteric, TMs pearly, nares patent, no discharge or erythema, pharynx normal Oral cavity: MMM, no lesions Neck: supple, no lymphadenopathy, no thyromegaly, no masses Heart: RRR, normal S1, S2, no murmurs Lungs: CTA bilaterally, no wheezes, rhonchi, or rales Abdomen: +bs, soft, non tender, non distended, no masses, no hepatomegaly, no splenomegaly Musculoskeletal: nontender, no swelling, no obvious deformity, ROM of R knee limited in flexion Extremities: no edema, no cyanosis, no clubbing Pulses: 2+ symmetric, upper and lower extremities, normal cap refill Neurological: alert, oriented x 3, CN2-12 intact,  strength normal upper extremities and lower extremities, sensation normal throughout, DTRs 2+ throughout, no cerebellar signs, gait normal Psychiatric: normal affect, behavior normal, pleasant   Medicare Attestation I have personally reviewed: The patient's medical and social history Their use of alcohol, tobacco or illicit drugs Their current medications and supplements The patient's functional ability including ADLs,fall risks, home safety risks, cognitive, and hearing and visual impairment Diet and physical activities Evidence for depression or mood disorders  The patient's weight, height, BMI, and visual acuity have been recorded in the chart.  I have made referrals, counseling, and provided education to the patient based on review of the above and I have provided the patient with a written personalized care plan for preventive services.     Garnet Sierras, NP   01/02/2020

## 2020-01-03 LAB — CBC WITH DIFFERENTIAL/PLATELET
Absolute Monocytes: 437 cells/uL (ref 200–950)
Basophils Absolute: 62 cells/uL (ref 0–200)
Basophils Relative: 0.8 %
Eosinophils Absolute: 187 cells/uL (ref 15–500)
Eosinophils Relative: 2.4 %
HCT: 43.5 % (ref 35.0–45.0)
Hemoglobin: 13.8 g/dL (ref 11.7–15.5)
Lymphs Abs: 2309 cells/uL (ref 850–3900)
MCH: 28.5 pg (ref 27.0–33.0)
MCHC: 31.7 g/dL — ABNORMAL LOW (ref 32.0–36.0)
MCV: 89.9 fL (ref 80.0–100.0)
MPV: 11.6 fL (ref 7.5–12.5)
Monocytes Relative: 5.6 %
Neutro Abs: 4805 cells/uL (ref 1500–7800)
Neutrophils Relative %: 61.6 %
Platelets: 214 10*3/uL (ref 140–400)
RBC: 4.84 10*6/uL (ref 3.80–5.10)
RDW: 12.6 % (ref 11.0–15.0)
Total Lymphocyte: 29.6 %
WBC: 7.8 10*3/uL (ref 3.8–10.8)

## 2020-01-03 LAB — LIPID PANEL
Cholesterol: 111 mg/dL (ref <200)
HDL: 56 mg/dL (ref >=50)
LDL Cholesterol (Calc): 42 mg/dL (calc)
Non-HDL Cholesterol (Calc): 55 mg/dL (calc) (ref <130)
Total CHOL/HDL Ratio: 2 (calc) (ref <5.0)
Triglycerides: 54 mg/dL (ref <150)

## 2020-01-03 LAB — COMPLETE METABOLIC PANEL WITH GFR
AG Ratio: 2 (calc) (ref 1.0–2.5)
ALT: 20 U/L (ref 6–29)
AST: 14 U/L (ref 10–35)
Albumin: 4 g/dL (ref 3.6–5.1)
Alkaline phosphatase (APISO): 59 U/L (ref 37–153)
BUN/Creatinine Ratio: 35 (calc) — ABNORMAL HIGH (ref 6–22)
BUN: 33 mg/dL — ABNORMAL HIGH (ref 7–25)
CO2: 25 mmol/L (ref 20–32)
Calcium: 9.1 mg/dL (ref 8.6–10.4)
Chloride: 107 mmol/L (ref 98–110)
Creat: 0.94 mg/dL (ref 0.50–0.99)
GFR, Est African American: 73 mL/min/{1.73_m2} (ref >=60)
GFR, Est Non African American: 63 mL/min/{1.73_m2} (ref >=60)
Globulin: 2 g/dL (calc) (ref 1.9–3.7)
Glucose, Bld: 91 mg/dL (ref 65–99)
Potassium: 4 mmol/L (ref 3.5–5.3)
Sodium: 143 mmol/L (ref 135–146)
Total Bilirubin: 0.3 mg/dL (ref 0.2–1.2)
Total Protein: 6 g/dL — ABNORMAL LOW (ref 6.1–8.1)

## 2020-01-03 LAB — HEMOGLOBIN A1C
Hgb A1c MFr Bld: 5.5 % of total Hgb (ref <5.7)
Mean Plasma Glucose: 111 (calc)
eAG (mmol/L): 6.2 (calc)

## 2020-01-03 LAB — HEPATITIS C ANTIBODY
Hepatitis C Ab: NONREACTIVE
SIGNAL TO CUT-OFF: 0.08 (ref <1.00)

## 2020-01-22 ENCOUNTER — Other Ambulatory Visit: Payer: Self-pay

## 2020-01-22 ENCOUNTER — Ambulatory Visit
Admission: RE | Admit: 2020-01-22 | Discharge: 2020-01-22 | Disposition: A | Discharge status: Home / Self Care | Payer: Medicare Other | Source: Ambulatory Visit | Attending: Internal Medicine | Admitting: Internal Medicine

## 2020-01-22 DIAGNOSIS — Z1231 Encounter for screening mammogram for malignant neoplasm of breast: Secondary | ICD-10-CM

## 2020-03-03 ENCOUNTER — Other Ambulatory Visit: Payer: Self-pay | Admitting: Physician Assistant

## 2020-03-03 DIAGNOSIS — G4700 Insomnia, unspecified: Secondary | ICD-10-CM

## 2020-03-14 DIAGNOSIS — M1712 Unilateral primary osteoarthritis, left knee: Secondary | ICD-10-CM | POA: Diagnosis not present

## 2020-03-21 DIAGNOSIS — M1712 Unilateral primary osteoarthritis, left knee: Secondary | ICD-10-CM | POA: Diagnosis not present

## 2020-03-28 DIAGNOSIS — M1712 Unilateral primary osteoarthritis, left knee: Secondary | ICD-10-CM | POA: Diagnosis not present

## 2020-04-02 NOTE — Progress Notes (Addendum)
FOLLOW UP  Assessment and Plan:   Jade Singh was seen today for follow-up.  Diagnoses and all orders for this visit:  Essential hypertension Continue current medications: Triamterene-HCTZ 37.5-25mg  Follows with cardiology Monitor blood pressure at home; call if consistently over 130/80 Continue DASH diet.   Reminder to go to the ER if any CP, SOB, nausea, dizziness, severe HA, changes vision/speech, left arm numbness and tingling and jaw pain. -     CBC with Differential/Platelet -     COMPLETE METABOLIC PANEL WITH GFR  Hyperlipidemia, mixed Continue medications: rosuvastatin 40mg  half tablet, four days a week. Discussed dietary and exercise modifications Low fat diet -     Lipid panel  Gastroesophageal reflux disease, unspecified whether esophagitis present Doing well at this time Continue: pantropazole 40mg  daily Diet discussed Monitor for triggers Avoid food with high acid content Avoid excessive cafeine Increase water intake  Vitamin D deficiency Continue supplementation to maintain goal of 70-100 Taking Vitamin D  IU daily Defer vitamin D level   Abnormal glucose Morbid obesity (HCC) Discussed dietary and exercise modifications   Irritable bowel syndrome, unspecified type Diverticulosis Doing well at this time  Anxiety state Has diazepam 10mg  as needed  Recurrent major depressive disorder, in full remission (HCC) Taking bupriopion 150mg  daily Discussed stress management techniques  Discussed, increase water,intake & good sleep hygiene  Discussed increasing exercise & vegetables in diet  Submandibular lymphadenopathy Right, single node, tender Will check labs, reports some improvement Consider ABX vs steroids related to lab results No other lymph involvement  Medication management Continued  Hand written prescription given to patient for bupropion 150mg , diazepam 10mg , pantropazole 40mg , rosuvastatin 40mg  and Zolpidem 5mg , changing pharmacy's to  from [facility].   Continue diet and meds as discussed. Further disposition pending results of labs. Discussed med's effects and SE's.   Over 30 minutes of face to face interview, exam, counseling, chart review, and critical decision making was performed.   Future Appointments  Date Time Provider Department Center  07/25/2020 10:00 AM , MD [facility] None    ----------------------------------------------------------------------------------------------------------------------  HPI 69 y.o. female  presents for 3 month follow up on HTN, HLD, glucose management, weight and vitamin D deficiency.   She reports edema to right side lymph node that started two weeks ago.  She reports that it has gone down some but not resolved.  She has not done any therapies or taken any OTC medication for this.  Denies any fevers, difficulty swallowing food or liquids.  Denies any edema under tongue.  She reports she has had salivary stones in the past but this seems different to her. She is followed by cardiology annually, and by Dr. for right knee arthritis s/p TKA.    She has a diagnosis of GERD which is currently managed by protonix 40 mg daily (failed transition to famotidine).  she reports symptoms is currently well controlled, and denies breakthrough reflux, burning in chest, hoarseness or cough.     She has a diagnosis of anxiety and is currently on valium 10 mg PRN, she takes very rarely. She is prescribed ambien 5 mg daily for sleep and reports she has done very well with this without SE. Remote history of normal sleep study other than RLS.   BMI is Body mass index is 38.21 kg/m., she has been working on diet and exercise. She was doing weight watchers but since COVID has not done it.   Wt Readings from Last 3 Encounters:  04/03/20 229  lb 9.6 oz (104.1 kg)  01/02/20 230 lb (104.3 kg)  12/26/19 229 lb (103.9 kg)   Her blood pressure has been controlled at home, today their  BP is BP: 132/84  She does not workout. She denies chest pain, shortness of breath, dizziness.   She is on cholesterol medication, recently switched to Rosuvastatin 40 mg, haf tablet every other day and denies myalgias. Her cholesterol is at goal. The cholesterol last visit was:   Lab Results  Component Value Date   CHOL 111 01/02/2020   HDL 56 01/02/2020   LDLCALC 42 01/02/2020   TRIG 54 01/02/2020   CHOLHDL 2.0 01/02/2020    She has been working on diet and exercise for glucose management, and denies increased appetite, nausea, paresthesia of the feet, polydipsia, polyuria and visual disturbances. Last A1C in the office was:  Lab Results  Component Value Date   HGBA1C 5.5 01/02/2020   Patient is on Vitamin D supplement.   Lab Results  Component Value Date   VD25OH 16 10/01/2019        Current Medications:  Current Outpatient Medications on File Prior to Visit  Medication Sig  . aspirin 81 MG tablet Take 81 mg by mouth daily.  Marland Kitchen buPROPion (WELLBUTRIN XL) 150 MG 24 hr tablet Take 1 tablet (150 mg total) by mouth every morning.  . calcipotriene (DOVONOX) 0.005 % ointment Apply 1 application topically as needed (Hands).  . cephALEXin (KEFLEX) 500 MG capsule Take 500 mg by mouth as needed.  . cetirizine (ZYRTEC) 10 MG tablet Take 10 mg by mouth daily.  . Cholecalciferol (VITAMIN D3) 5000 UNITS CAPS Take 5,000 Units by mouth 4 (four) times a week. Takes M,W,TH,F  . diazepam (VALIUM) 10 MG tablet Take 1/2-1 tablet at Bedtime ONLY if needed for Sleep &  limit to 5 days /week to avoid addiction (Patient taking differently: Take 5-10 mg by mouth daily as needed for anxiety or sleep. )  . fluticasone (FLONASE) 50 MCG/ACT nasal spray Place 1 spray into both nostrils daily as needed for allergies or rhinitis.  . Magnesium 250 MG TABS Take 250 mg by mouth daily.  . Multiple Vitamins-Minerals (MULTIVITAMIN WITH MINERALS) tablet Take 1 tablet by mouth daily.  . naproxen sodium (ALEVE) 220 MG  tablet Take 440 mg by mouth as needed (Knees).  . pantoprazole (PROTONIX) 40 MG tablet TAKE 1 TABLET DAILY TO PREVENT INDIGESTION & HEARTBURN  . rosuvastatin (CRESTOR) 40 MG tablet Take 1 tablet daily for Cholesterol (Patient taking differently: Take 20 mg by mouth See admin instructions. Take four times a week)  . triamterene-hydrochlorothiazide (MAXZIDE-25) 37.5-25 MG tablet Take 1 tablet by mouth at bedtime.  Marland Kitchen zolpidem (AMBIEN) 5 MG tablet TAKE 1/2 TO 1 TABLET BY MOUTH AT BEDTIME ONE HOUR BEFORE SLEEP  . ezetimibe (ZETIA) 10 MG tablet Take 1 tablet (10 mg total) by mouth daily.  . nitroGLYCERIN (NITROSTAT) 0.4 MG SL tablet PLACE 1 TABLET (0.4 MG TOTAL) UNDER THE TONGUE EVERY 5 (FIVE) MINUTES AS NEEDED FOR CHEST PAIN.   No current facility-administered medications on file prior to visit.     Allergies:  Allergies  Allergen Reactions  . Doxycycline Other (See Comments)    Jittery and causes nausea  . Levaquin [Levofloxacin In D5w]     yeast  . Oruvail [Ketoprofen]     GI upset     Medical History:  Past Medical History:  Diagnosis Date  . ANAL FISSURE, HX OF 11/06/2007   Qualifier: Diagnosis of  By: Misty Stanley CMA (AAMA), Marchelle Folks    . Anxiety   . Depression   . Difficulty sleeping    takes Ambien  . Diverticulosis 5/1/5  . DJD (degenerative joint disease)   . Family history of anesthesia complication    "mother quit breathing" 30 yrs ago pt does not know any more details. Pt has never had any problems with anesthesia herself  . GERD (gastroesophageal reflux disease)   . Hyperlipidemia   . Hypertension   . Obese   . Peripheral edema   . Psoriasis    HANDS  . Psoriasis   . 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 Unspecified vitamin D deficiency   . Urgency of urination    Family history- Reviewed and unchanged Social history- Reviewed and unchanged   Review of Systems:  Review of Systems  Constitutional: Negative for chills, diaphoresis, fever, malaise/fatigue and weight loss.   HENT: Negative for congestion, ear discharge, ear pain, hearing loss, nosebleeds, sinus pain, sore throat and tinnitus.   Eyes: Negative for blurred vision, double vision, photophobia, pain, discharge and redness.  Respiratory: Negative for cough, hemoptysis, sputum production, shortness of breath, wheezing and stridor.   Cardiovascular: Negative for chest pain, palpitations, orthopnea, claudication, leg swelling and PND.  Gastrointestinal: Negative for abdominal pain, blood in stool, constipation, diarrhea, heartburn, melena, nausea and vomiting.  Genitourinary: Negative for dysuria, flank pain, frequency, hematuria and urgency.  Musculoskeletal: Negative for back pain, falls, joint pain, myalgias and neck pain.  Skin: Negative for itching and rash.  Neurological: Negative for dizziness, tingling, tremors, sensory change, speech change, focal weakness, seizures, loss of consciousness, weakness and headaches.  Endo/Heme/Allergies: Negative for environmental allergies and polydipsia. Does not bruise/bleed easily.  Psychiatric/Behavioral: Negative for depression, hallucinations, memory loss, substance abuse and suicidal ideas. The patient is not nervous/anxious and does not have insomnia.      Physical Exam: BP 132/84   Pulse 78   Temp (!) 97.1 F (36.2 C)   Wt 229 lb 9.6 oz (104.1 kg)   SpO2 98%   BMI 38.21 kg/m  Wt Readings from Last 3 Encounters:  04/03/20 229 lb 9.6 oz (104.1 kg)  01/02/20 230 lb (104.3 kg)  12/26/19 229 lb (103.9 kg)   General Appearance: Well nourished, in no apparent distress. Eyes: PERRLA, EOMs, conjunctiva no swelling or erythema Sinuses: No Frontal/maxillary tenderness ENT/Mouth: Ext aud canals clear, TMs without erythema, bulging. No erythema, swelling, or exudate on post pharynx.  Tonsils not swollen or erythematous. Hearing normal.  Neck: Supple, thyroid normal.  Respiratory: Respiratory effort normal, BS equal bilaterally without rales, rhonchi,  wheezing or stridor.  Cardio: RRR with no MRGs. Brisk peripheral pulses without edema.  Abdomen: Soft, + BS.  Non tender, no guarding, rebound, hernias, masses. Lymphatics: Right side single submandibular lymph node edema, tender to palpation, approx size of walnut.  Non tender without lymphadenopathy for all others.  Musculoskeletal: Full ROM, 5/5 strength, antalgic gait gait Skin: Warm, dry without rashes, lesions, ecchymosis.  Neuro: Cranial nerves intact. No cerebellar symptoms.  Psych: Awake and oriented X 3, normal affect, Insight and Judgment appropriate.     Elder Negus, Edrick Oh, DNP [facility] 04/03/2020  9:38 AM

## 2020-04-03 ENCOUNTER — Encounter: Payer: Self-pay | Admitting: Adult Health Nurse Practitioner

## 2020-04-03 ENCOUNTER — Ambulatory Visit (INDEPENDENT_AMBULATORY_CARE_PROVIDER_SITE_OTHER): Payer: Medicare Other | Admitting: Adult Health Nurse Practitioner

## 2020-04-03 ENCOUNTER — Other Ambulatory Visit: Payer: Self-pay

## 2020-04-03 VITALS — BP 132/84 | HR 78 | Temp 97.1°F | Wt 229.6 lb

## 2020-04-03 DIAGNOSIS — F411 Generalized anxiety disorder: Secondary | ICD-10-CM

## 2020-04-03 DIAGNOSIS — R590 Localized enlarged lymph nodes: Secondary | ICD-10-CM

## 2020-04-03 DIAGNOSIS — E559 Vitamin D deficiency, unspecified: Secondary | ICD-10-CM

## 2020-04-03 DIAGNOSIS — K219 Gastro-esophageal reflux disease without esophagitis: Secondary | ICD-10-CM

## 2020-04-03 DIAGNOSIS — Z79899 Other long term (current) drug therapy: Secondary | ICD-10-CM

## 2020-04-03 DIAGNOSIS — I10 Essential (primary) hypertension: Secondary | ICD-10-CM

## 2020-04-03 DIAGNOSIS — R7309 Other abnormal glucose: Secondary | ICD-10-CM | POA: Diagnosis not present

## 2020-04-03 DIAGNOSIS — F3342 Major depressive disorder, recurrent, in full remission: Secondary | ICD-10-CM | POA: Diagnosis not present

## 2020-04-03 DIAGNOSIS — K589 Irritable bowel syndrome without diarrhea: Secondary | ICD-10-CM

## 2020-04-03 DIAGNOSIS — E782 Mixed hyperlipidemia: Secondary | ICD-10-CM | POA: Diagnosis not present

## 2020-04-03 DIAGNOSIS — K5790 Diverticulosis of intestine, part unspecified, without perforation or abscess without bleeding: Secondary | ICD-10-CM | POA: Diagnosis not present

## 2020-04-04 ENCOUNTER — Other Ambulatory Visit: Payer: Self-pay | Admitting: Adult Health Nurse Practitioner

## 2020-04-04 DIAGNOSIS — R599 Enlarged lymph nodes, unspecified: Secondary | ICD-10-CM

## 2020-04-04 LAB — COMPLETE METABOLIC PANEL WITH GFR
AG Ratio: 2.3 (calc) (ref 1.0–2.5)
ALT: 19 U/L (ref 6–29)
AST: 12 U/L (ref 10–35)
Albumin: 4.1 g/dL (ref 3.6–5.1)
Alkaline phosphatase (APISO): 57 U/L (ref 37–153)
BUN/Creatinine Ratio: 30 (calc) — ABNORMAL HIGH (ref 6–22)
BUN: 27 mg/dL — ABNORMAL HIGH (ref 7–25)
CO2: 28 mmol/L (ref 20–32)
Calcium: 9.2 mg/dL (ref 8.6–10.4)
Chloride: 106 mmol/L (ref 98–110)
Creat: 0.9 mg/dL (ref 0.50–0.99)
GFR, Est African American: 77 mL/min/{1.73_m2} (ref >=60)
GFR, Est Non African American: 66 mL/min/{1.73_m2} (ref >=60)
Globulin: 1.8 g/dL (calc) — ABNORMAL LOW (ref 1.9–3.7)
Glucose, Bld: 106 mg/dL — ABNORMAL HIGH (ref 65–99)
Potassium: 4.4 mmol/L (ref 3.5–5.3)
Sodium: 142 mmol/L (ref 135–146)
Total Bilirubin: 0.4 mg/dL (ref 0.2–1.2)
Total Protein: 5.9 g/dL — ABNORMAL LOW (ref 6.1–8.1)

## 2020-04-04 LAB — CBC WITH DIFFERENTIAL/PLATELET
Absolute Monocytes: 511 cells/uL (ref 200–950)
Basophils Absolute: 62 cells/uL (ref 0–200)
Basophils Relative: 0.9 %
Eosinophils Absolute: 228 cells/uL (ref 15–500)
Eosinophils Relative: 3.3 %
HCT: 45.2 % — ABNORMAL HIGH (ref 35.0–45.0)
Hemoglobin: 14.4 g/dL (ref 11.7–15.5)
Lymphs Abs: 1904 cells/uL (ref 850–3900)
MCH: 28.6 pg (ref 27.0–33.0)
MCHC: 31.9 g/dL — ABNORMAL LOW (ref 32.0–36.0)
MCV: 89.9 fL (ref 80.0–100.0)
MPV: 11.4 fL (ref 7.5–12.5)
Monocytes Relative: 7.4 %
Neutro Abs: 4195 cells/uL (ref 1500–7800)
Neutrophils Relative %: 60.8 %
Platelets: 206 10*3/uL (ref 140–400)
RBC: 5.03 10*6/uL (ref 3.80–5.10)
RDW: 12.5 % (ref 11.0–15.0)
Total Lymphocyte: 27.6 %
WBC: 6.9 10*3/uL (ref 3.8–10.8)

## 2020-04-04 LAB — LIPID PANEL
Cholesterol: 115 mg/dL (ref <200)
HDL: 56 mg/dL (ref >=50)
LDL Cholesterol (Calc): 43 mg/dL (calc)
Non-HDL Cholesterol (Calc): 59 mg/dL (calc) (ref <130)
Total CHOL/HDL Ratio: 2.1 (calc) (ref <5.0)
Triglycerides: 76 mg/dL (ref <150)

## 2020-04-04 MED ORDER — DEXAMETHASONE 0.5 MG PO TABS: ORAL_TABLET | ORAL | 0 refills | Status: DC

## 2020-04-06 ENCOUNTER — Other Ambulatory Visit: Payer: Self-pay | Admitting: Cardiovascular Disease

## 2020-04-09 DIAGNOSIS — Z23 Encounter for immunization: Secondary | ICD-10-CM | POA: Diagnosis not present

## 2020-04-27 ENCOUNTER — Other Ambulatory Visit: Payer: Self-pay | Admitting: Internal Medicine

## 2020-04-27 DIAGNOSIS — K219 Gastro-esophageal reflux disease without esophagitis: Secondary | ICD-10-CM

## 2020-05-13 DIAGNOSIS — Z23 Encounter for immunization: Secondary | ICD-10-CM | POA: Diagnosis not present

## 2020-07-24 ENCOUNTER — Encounter: Payer: Self-pay | Admitting: Internal Medicine

## 2020-07-24 NOTE — Progress Notes (Signed)
Comprehensive Evaluation &  Examination      This very nice 70 y.o.  MWF  presents for a comprehensive evaluation and management of multiple medical co-morbidities.  Patient has been followed for HTN, HLD, Prediabetes  and Vitamin D Deficiency. Patient's GERD is controlled on her meds.        HTN predates since  2011.  In Oct 2020, patient had a Heart Cath finding Normal EF 55-65%, Nl systolic & diastolic pressures & No significant CAD.  Patient's BP has been controlled at home and patient denies any cardiac symptoms as chest pain, palpitations, shortness of breath, dizziness or ankle swelling. Today's BP: 136/80       Patient's hyperlipidemia is controlled with diet and  Rosuvastatin /Ezetimibe. Patient denies myalgias or other medication SE's. Last lipids were at goal:  Lab Results  Component Value Date   CHOL 115 04/03/2020   HDL 56 04/03/2020   LDLCALC 43 04/03/2020   TRIG 76 04/03/2020   CHOLHDL 2.1 04/03/2020        Patient has  Morbid Obesity (BMI 39+)  And consequent hx/o prediabetes (A1c 5.7% /2015) and patient denies reactive hypoglycemic symptoms, visual blurring, diabetic polys or paresthesias. Last A1c was normal & at goal:   Lab Results  Component Value Date   HGBA1C 5.5 01/02/2020        Finally, patient has history of Vitamin D Deficiency and last Vitamin D was at goal:  Lab Results  Component Value Date   VD25OH 65 10/01/2019    Current Outpatient Medications on File Prior to Visit  Medication Sig  . aspirin 81 MG tablet Take 81 mg by mouth daily.  Marland Kitchen buPROPion (WELLBUTRIN XL) 150 MG 24 hr tablet Take 1 tablet (150 mg total) by mouth every morning.  . calcipotriene (DOVONOX) 0.005 % ointment Apply 1 application topically as needed (Hands).  . cephALEXin (KEFLEX) 500 MG capsule Take 500 mg by mouth as needed.  . cetirizine (ZYRTEC) 10 MG tablet Take 10 mg by mouth daily.  . Cholecalciferol (VITAMIN D3) 5000 UNITS CAPS Take 5,000 Units by mouth 4 (four)  times a week. Takes M,W,TH,F  . diazepam (VALIUM) 10 MG tablet Take 1/2-1 tablet at Bedtime ONLY if needed for Sleep &  limit to 5 days /week to avoid addiction (Patient taking differently: Take 5-10 mg by mouth daily as needed for anxiety or sleep.)  . fluticasone (FLONASE) 50 MCG/ACT nasal spray Place 1 spray into both nostrils daily as needed for allergies or rhinitis.  . Magnesium 250 MG TABS Take 250 mg by mouth daily.  . Multiple Vitamins-Minerals (MULTIVITAMIN WITH MINERALS) tablet Take 1 tablet by mouth daily.  . naproxen sodium (ALEVE) 220 MG tablet Take 440 mg by mouth as needed (Knees).  . pantoprazole (PROTONIX) 40 MG tablet TAKE 1 TABLET DAILY TO PREVENT INDIGESTION & HEARTBURN  . rosuvastatin (CRESTOR) 40 MG tablet Take 1 tablet daily for Cholesterol (Patient taking differently: Take 20 mg by mouth See admin instructions. Take four times a week)  . triamterene-hydrochlorothiazide (MAXZIDE-25) 37.5-25 MG tablet TAKE 1 TABLET BY MOUTH EVERYDAY AT BEDTIME  . zolpidem (AMBIEN) 5 MG tablet TAKE 1/2 TO 1 TABLET BY MOUTH AT BEDTIME ONE HOUR BEFORE SLEEP  . ezetimibe (ZETIA) 10 MG tablet Take 1 tablet (10 mg total) by mouth daily.  . nitroGLYCERIN (NITROSTAT) 0.4 MG SL tablet PLACE 1 TABLET (0.4 MG TOTAL) UNDER THE TONGUE EVERY 5 (FIVE) MINUTES AS NEEDED FOR CHEST PAIN.  Allergies  Allergen Reactions  . Doxycycline Other (See Comments)    Jittery and causes nausea  . Levaquin [Levofloxacin In D5w]     yeast  . Oruvail [Ketoprofen]     GI upset    Past Medical History:  Diagnosis Date  . ANAL FISSURE, HX OF 11/06/2007   Qualifier: Diagnosis of  By: Misty Stanley CMA (AAMA), Marchelle Folks    . Anxiety   . Depression   . Difficulty sleeping    takes Ambien  . Diverticulosis 5/1/5  . DJD (degenerative joint disease)   . Family history of anesthesia complication    "mother quit breathing" 30 yrs ago pt does not know any more details. Pt has never had any problems with anesthesia herself   . GERD    . Hyperlipidemia   . Hypertension   . Obese   . Peripheral edema   . Psoriasis    HANDS  . Psoriasis   . 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 Unspecified vitamin D deficiency   . Urgency of urination    Health Maintenance  Topic Date Due  . MAMMOGRAM  01/21/2021  . COLONOSCOPY (Pts 45-70yrs Insurance coverage will need to be confirmed)  11/17/2023  . TETANUS/TDAP  01/01/2030  . INFLUENZA VACCINE  Completed  . DEXA SCAN  Completed  . COVID-19 Vaccine  Completed  . Hepatitis C Screening  Completed  . PNA vac Low Risk Adult  Completed   Immunization History  Administered Date(s) Administered  . Fluad Quad(high Dose 65+) 04/02/2019  . Influenza Inj Mdck Quad With Preservative 05/17/2017  . Influenza Split 06/25/2014, 05/08/2015  . Influenza, High Dose Seasonal PF 05/09/2018, 04/09/2020  . Influenza,inj,quad, With Preservative 04/13/2016  . Influenza-Unspecified 05/28/2013  . Moderna Sars-Covid-2 Vaccination 08/24/2019, 09/22/2019, 05/13/2020  . PPD Test 08/29/2013  . Pneumococcal Conjugate-13 06/08/2017  . Pneumococcal Polysaccharide-23 06/07/2013, 06/08/2018  . Td 01/02/2020  . Tdap 05/19/2009  . Zoster 06/25/2014    Last Colon -  Dr Lina Sar - recc 10 year f/u due May 2025  Last MGM - 01/22/2020  Past Surgical History:  Procedure Laterality Date  . ABDOMINAL HYSTERECTOMY     partial  . APPENDECTOMY    . CARDIAC CATHETERIZATION  2005    no problems per pt   . KNEE ARTHROSCOPY     RT 2011, LT 2009  . LEFT HEART CATH AND CORONARY ANGIOGRAPHY N/A 05/11/2019   Procedure: LEFT HEART CATH AND CORONARY ANGIOGRAPHY;  Surgeon: Iran Ouch, MD;  Location: [facility];  Service: Cardiovascular;  Laterality: N/A;  . TONSILLECTOMY AND ADENOIDECTOMY    . TOTAL KNEE ARTHROPLASTY Right 02/28/2014   Procedure: RIGHT TOTAL KNEE ARTHROPLASTY;  Surgeon: Javier Docker, MD;  Location: [facility];  Service: Orthopedics;  Laterality: Right;  . TUBAL LIGATION      Family History  Problem Relation Age of Onset  . Stroke Mother   . Hyperlipidemia Mother   . Heart attack Mother   . Cancer Brother 75       prostate, deceased  . Heart disease Paternal Grandfather   . Heart attack Maternal Grandfather   . Diabetes Paternal Uncle   . Colon cancer Neg Hx   . Breast cancer Neg Hx    Social History   Tobacco Use  . Smoking status: Former Smoker    Quit date: 07/19/2000    Years since quitting: 20.0  . Smokeless tobacco: Never Used  Substance Use Topics  . Alcohol use: Yes  Comment: occ  . Drug use: No    ROS Constitutional: Denies fever, chills, weight loss/gain, headaches, insomnia,  night sweats, and change in appetite. Does c/o fatigue. Eyes: Denies redness, blurred vision, diplopia, discharge, itchy, watery eyes.  ENT: Denies discharge, congestion, post nasal drip, epistaxis, sore throat, earache, hearing loss, dental pain, Tinnitus, Vertigo, Sinus pain, snoring.  Cardio: Denies chest pain, palpitations, irregular heartbeat, syncope, dyspnea, diaphoresis, orthopnea, PND, claudication, edema Respiratory: denies cough, dyspnea, DOE, pleurisy, hoarseness, laryngitis, wheezing.  Gastrointestinal: Denies dysphagia, heartburn, reflux, water brash, pain, cramps, nausea, vomiting, bloating, diarrhea, constipation, hematemesis, melena, hematochezia, jaundice, hemorrhoids Genitourinary: Denies dysuria, frequency, urgency, nocturia, hesitancy, discharge, hematuria, flank pain Breast: Breast lumps, nipple discharge, bleeding.  Musculoskeletal: Denies arthralgia, myalgia, stiffness, Jt. Swelling, pain, limp, and strain/sprain. Denies falls. Skin: Denies puritis, rash, hives, warts, acne, eczema, changing in skin lesion Neuro: No weakness, tremor, incoordination, spasms, paresthesia, pain Psychiatric: Denies confusion, memory loss, sensory loss. Denies Depression. Endocrine: Denies change in weight, skin, hair change, nocturia, and paresthesia, diabetic  polys, visual blurring, hyper / hypo glycemic episodes.  Heme/Lymph: No excessive bleeding, bruising, enlarged lymph nodes.  Physical Exam  BP 136/80   Pulse 74   Temp (!) 97.5 F (36.4 C)   Ht 5' 4.5" (1.638 m)   Wt 231 lb (104.8 kg)   SpO2 98%   BMI 39.04 kg/m   General Appearance: Well nourished, well groomed and in no apparent distress.  Eyes: PERRLA, EOMs, conjunctiva no swelling or erythema, normal fundi and vessels. Sinuses: No frontal/maxillary tenderness ENT/Mouth: EACs patent / TMs  nl. Nares clear without erythema, swelling, mucoid exudates. Oral hygiene is good. No erythema, swelling, or exudate. Tongue normal, non-obstructing. Tonsils not swollen or erythematous. Hearing normal.  Neck: Supple, thyroid not palpable. No bruits, nodes or JVD. Respiratory: Respiratory effort normal.  BS equal and clear bilateral without rales, rhonci, wheezing or stridor. Cardio: Heart sounds are normal with regular rate and rhythm and no murmurs, rubs or gallops. Peripheral pulses are normal and equal bilaterally without edema. No aortic or femoral bruits. Chest: symmetric with normal excursions and percussion. Breasts: Symmetric, without lumps, nipple discharge, retractions, or fibrocystic changes.  Abdomen: Flat, soft with bowel sounds active. Nontender, no guarding, rebound, hernias, masses, or organomegaly.  Lymphatics: Non tender without lymphadenopathy.  Genitourinary:  Musculoskeletal: Full ROM all peripheral extremities, joint stability, 5/5 strength, and normal gait. Skin: Warm and dry without rashes, lesions, cyanosis, clubbing or  ecchymosis.  Neuro: Cranial nerves intact, reflexes equal bilaterally. Normal muscle tone, no cerebellar symptoms. Sensation intact.  Pysch: Alert and oriented X 3, normal affect, Insight and Judgment appropriate.   Assessment and Plan  1. Essential hypertension  - EKG 12-Lead - Urinalysis, Routine w reflex microscopic - Microalbumin / creatinine  urine ratio - CBC with Differential/Platelet - COMPLETE METABOLIC PANEL WITH GFR - Magnesium - TSH  2. Hyperlipidemia, mixed  - EKG 12-Lead - Lipid panel - TSH  3. Abnormal glucose  - EKG 12-Lead - Hemoglobin A1c - Insulin, random  4. Vitamin D deficiency  - VITAMIN D 25 Hydroxy   5. Gastroesophageal reflux disease   - CBC with Differential/Platelet  6. Screening for colorectal cancer  - POC Hemoccult Bld/Stl   7. Class 2 severe obesity due to excess calories with serious comorbidity  and body mass index (BMI) of 38.0 to 38.9 in adult (HCC)  - TSH  8. Screening for ischemic heart disease  - EKG 12-Lead  9. FHx: heart disease  -  EKG 12-Lead  10. Former smoker  - EKG 12-Lead  11. Medication management  - Urinalysis, Routine w reflex microscopic - Microalbumin / creatinine urine ratio - CBC with Differential/Platelet - COMPLETE METABOLIC PANEL WITH GFR - Magnesium - Lipid panel - TSH - Hemoglobin A1c - Insulin, random - VITAMIN D 25 Hydroxy         Patient was counseled in prudent diet to achieve/maintain BMI less than 25 for weight control, BP monitoring, regular exercise and medications. Discussed med's effects and SE's. Screening labs and tests as requested with regular follow-up as recommended. Over 40 minutes of exam, counseling, chart review and high complex critical decision making was performed.   Kirtland Bouchard, MD

## 2020-07-24 NOTE — Patient Instructions (Signed)
Due to recent changes in healthcare laws, you may see the results of your imaging and laboratory studies on MyChart before your provider has had a chance to review them.  We understand that in some cases there may be results that are confusing or concerning to you. Not all laboratory results come back in the same time frame and the provider may be waiting for multiple results in order to interpret others.  Please give [place] 48 hours in order for your provider to thoroughly review all the results before contacting the office for clarification of your results.   +++++++++++++++++++++++++  Vit D  & Vit C 1,000 mg   are recommended to help protect  against the Covid-19 and other Corona viruses.    Also it's recommended  to take  Zinc 50 mg  to help  protect against the Covid-19   and Mark place to get  is also on [email or web address]  and don't pay more than 6-8 cents /pill !  ================================ Coronavirus (COVID-19) Are you at risk?  Are you at risk for the Coronavirus (COVID-19)?  To be considered HIGH RISK for Coronavirus (COVID-19), you have to meet the following criteria:  . Traveled to [place], [place], [place], [place] or [place]; or in the [place] to [place], [place], [place]  . or [place]; and have fever, cough, and shortness of breath within the last 2 weeks of travel OR . Been in close contact with a person diagnosed with COVID-19 within the last 2 weeks and have  . fever, cough,and shortness of breath .  . IF YOU DO NOT MEET THESE CRITERIA, YOU ARE CONSIDERED LOW RISK FOR COVID-19.  What to do if you are HIGH RISK for COVID-19?  Marland Kitchen If you are having a medical emergency, call 911. . Seek medical care right away. Before you go to a doctor's office, urgent care or emergency department, .  call ahead and tell them about your recent travel, contact with someone diagnosed with COVID-19  .  and your symptoms.  . You should receive instructions from your physician's  office regarding next steps of care.  . When you arrive at healthcare provider, tell the healthcare staff immediately you have returned from  . visiting [place], [place], [place], [place] or [place]; or traveled in the [place] to [place], [place],  . [place] or [place] in the last two weeks or you have been in close contact with a person diagnosed with  . COVID-19 in the last 2 weeks.   . Tell the health care staff about your symptoms: fever, cough and shortness of breath. . After you have been seen by a medical provider, you will be either: o Tested for (COVID-19) and discharged home on quarantine except to seek medical care if  o symptoms worsen, and asked to  - Stay home and avoid contact with others until you get your results (4-5 days)  - Avoid travel on public transportation if possible (such as bus, train, or airplane) or o Sent to the Emergency Department by EMS for evaluation, COVID-19 testing  and  o possible admission depending on your condition and test results.  What to do if you are LOW RISK for COVID-19?  Reduce your risk of any infection by using the same precautions used for avoiding the common cold or flu:  Marland Kitchen Wash your hands often with soap and warm water for at least 20 seconds.  If soap and water are not readily  available,  . use an alcohol-based hand sanitizer with at least 60% alcohol.  . If coughing or sneezing, cover your mouth and nose by coughing or sneezing into the elbow areas of your shirt or coat, .  into a tissue or into your sleeve (not your hands). . Avoid shaking hands with others and consider head nods or verbal greetings only. . Avoid touching your eyes, nose, or mouth with unwashed hands.  . Avoid close contact with people who are sick. . Avoid places or events with large numbers of people in one location, like concerts or sporting events. . Carefully consider travel plans you have or are making. . If you are planning any travel outside or  inside the [place], visit the CDC's Travelers' Health webpage for the latest health notices. . If you have some symptoms but not all symptoms, continue to monitor at home and seek medical attention  . if your symptoms worsen. . If you are having a medical emergency, call 911.   . >>>>>>>>>>>>>>>>>>>>>>>>>>>>>>>>> . We Do NOT Approve of  [facility], [employer]  Our Patients  To Do Home Visits & We Do NOT Approve of [employer] > > > > > > > > > > > > > > > > > > > > > > > > > > > > > > > > > > > > > > >  Preventive Care for Adults  A healthy lifestyle and preventive care can promote health and wellness. Preventive health guidelines for women include the following key practices.  A routine yearly physical is a good way to check with your health care provider about your health and preventive screening. It is a chance to share any concerns and updates on your health and to receive a thorough exam.  Visit your dentist for a routine exam and preventive care every 6 months. Brush your teeth twice a day and floss once a day. Good oral hygiene prevents tooth decay and gum disease.  The frequency of eye exams is based on your age, health, family medical history, use of contact lenses, and other factors. Follow your health care provider's recommendations for frequency of eye exams.  Eat a healthy diet. Foods like vegetables, fruits, whole grains, low-fat dairy products, and lean protein foods contain the nutrients you need without too many calories. Decrease your intake of foods high in solid fats, added sugars, and salt. Eat the right amount of calories for you. Get information about a proper diet from your health care provider, if necessary.  Regular physical exercise is one of the most important things you can do for your health. Most adults should get at least 150 minutes of moderate-intensity exercise (any activity that increases your heart rate and causes you to sweat) each  week. In addition, most adults need muscle-strengthening exercises on 2 or more days a week.  Maintain a healthy weight. The body mass index (BMI) is a screening tool to identify possible weight problems. It provides an estimate of body fat based on height and weight. Your health care provider can find your BMI and can help you achieve or maintain a healthy weight. For adults 20 years and older:  A BMI below 18.5 is considered underweight.  A BMI of 18.5 to 24.9 is normal.  A BMI of 25 to 29.9 is considered overweight.  A BMI of 30 and above is considered obese.  Maintain normal blood lipids and cholesterol levels by exercising and minimizing your intake of  saturated fat. Eat a balanced diet with plenty of fruit and vegetables. If your lipid or cholesterol levels are high, you are over 50, or you are at high risk for heart disease, you may need your cholesterol levels checked more frequently. Ongoing high lipid and cholesterol levels should be treated with medicines if diet and exercise are not working.  If you smoke, find out from your health care provider how to quit. If you do not use tobacco, do not start.  Lung cancer screening is recommended for adults aged 78-80 years who are at high risk for developing lung cancer because of a history of smoking. A yearly low-dose CT scan of the lungs is recommended for people who have at least a 30-pack-year history of smoking and are a current smoker or have quit within the past 15 years. A pack year of smoking is smoking an average of 1 pack of cigarettes a day for 1 year (for example: 1 pack a day for 30 years or 2 packs a day for 15 years). Yearly screening should continue until the smoker has stopped smoking for at least 15 years. Yearly screening should be stopped for people who develop a health problem that would prevent them from having lung cancer treatment.  Avoid use of street drugs. Do not share needles with anyone. Ask for help if you need  support or instructions about stopping the use of drugs.  High blood pressure causes heart disease and increases the risk of stroke.  Ongoing high blood pressure should be treated with medicines if weight loss and exercise do not work.  If you are 14-45 years old, ask your health care provider if you should take aspirin to prevent strokes.  Diabetes screening involves taking a blood sample to check your fasting blood sugar level. This should be done once every 3 years, after age 40, if you are within normal weight and without risk factors for diabetes. Testing should be considered at a younger age or be carried out more frequently if you are overweight and have at least 1 risk factor for diabetes.  Breast cancer screening is essential preventive care for women. You should practice "breast self-awareness." This means understanding the normal appearance and feel of your breasts and may include breast self-examination. Any changes detected, no matter how small, should be reported to a health care provider. Women in their 18s and 30s should have a clinical breast exam (CBE) by a health care provider as part of a regular health exam every 1 to 3 years. After age 71, women should have a CBE every year. Starting at age 39, women should consider having a mammogram (breast X-ray test) every year. Women who have a family history of breast cancer should talk to their health care provider about genetic screening. Women at a high risk of breast cancer should talk to their health care providers about having an MRI and a mammogram every year.  Breast cancer gene (BRCA)-related cancer risk assessment is recommended for women who have family members with BRCA-related cancers. BRCA-related cancers include breast, ovarian, tubal, and peritoneal cancers. Having family members with these cancers may be associated with an increased risk for harmful changes (mutations) in the breast cancer genes BRCA1 and BRCA2. Results of the  assessment will determine the need for genetic counseling and BRCA1 and BRCA2 testing.  Routine pelvic exams to screen for cancer are no longer recommended for nonpregnant women who are considered low risk for cancer of the pelvic organs (ovaries,  uterus, and vagina) and who do not have symptoms. Ask your health care provider if a screening pelvic exam is right for you.  If you have had past treatment for cervical cancer or a condition that could lead to cancer, you need Pap tests and screening for cancer for at least 20 years after your treatment. If Pap tests have been discontinued, your risk factors (such as having a new sexual partner) need to be reassessed to determine if screening should be resumed. Some women have medical problems that increase the chance of getting cervical cancer. In these cases, your health care provider may recommend more frequent screening and Pap tests.    Colorectal cancer can be detected and often prevented. Most routine colorectal cancer screening begins at the age of 62 years and continues through age 28 years. However, your health care provider may recommend screening at an earlier age if you have risk factors for colon cancer. On a yearly basis, your health care provider may provide home test kits to check for hidden blood in the stool. Use of a small camera at the end of a tube, to directly examine the colon (sigmoidoscopy or colonoscopy), can detect the earliest forms of colorectal cancer. Talk to your health care provider about this at age 23, when routine screening begins.  Direct exam of the colon should be repeated every 5-10 years through age 31 years, unless early forms of pre-cancerous polyps or small growths are found.  Osteoporosis is a disease in which the bones lose minerals and strength with aging. This can result in serious bone fractures or breaks. The risk of osteoporosis can be identified using a bone density scan. Women ages 83 years and over and women  at risk for fractures or osteoporosis should discuss screening with their health care providers. Ask your health care provider whether you should take a calcium supplement or vitamin D to reduce the rate of osteoporosis.  Menopause can be associated with physical symptoms and risks. Hormone replacement therapy is available to decrease symptoms and risks. You should talk to your health care provider about whether hormone replacement therapy is right for you.  Use sunscreen. Apply sunscreen liberally and repeatedly throughout the day. You should seek shade when your shadow is shorter than you. Protect yourself by wearing long sleeves, pants, a wide-brimmed hat, and sunglasses year round, whenever you are outdoors.  Once a month, do a whole body skin exam, using a mirror to look at the skin on your back. Tell your health care provider of new moles, moles that have irregular borders, moles that are larger than a pencil eraser, or moles that have changed in shape or color.  Stay current with required vaccines (immunizations).  Influenza vaccine. All adults should be immunized every year.  Tetanus, diphtheria, and acellular pertussis (Td, Tdap) vaccine. Pregnant women should receive 1 dose of Tdap vaccine during each pregnancy. The dose should be obtained regardless of the length of time since the last dose. Immunization is preferred during the 27th-36th week of gestation. An adult who has not previously received Tdap or who does not know her vaccine status should receive 1 dose of Tdap. This initial dose should be followed by tetanus and diphtheria toxoids (Td) booster doses every 10 years. Adults with an unknown or incomplete history of completing a 3-dose immunization series with Td-containing vaccines should begin or complete a primary immunization series including a Tdap dose. Adults should receive a Td booster every 10 years.  Zoster vaccine. One dose is recommended for adults aged 64 years or  older unless certain conditions are present.    Pneumococcal 13-valent conjugate (PCV13) vaccine. When indicated, a person who is uncertain of her immunization history and has no record of immunization should receive the PCV13 vaccine. An adult aged 61 years or older who has certain medical conditions and has not been previously immunized should receive 1 dose of PCV13 vaccine. This PCV13 should be followed with a dose of pneumococcal polysaccharide (PPSV23) vaccine. The PPSV23 vaccine dose should be obtained at least 1 or more year(s) after the dose of PCV13 vaccine. An adult aged 46 years or older who has certain medical conditions and previously received 1 or more doses of PPSV23 vaccine should receive 1 dose of PCV13. The PCV13 vaccine dose should be obtained 1 or more years after the last PPSV23 vaccine dose.    Pneumococcal polysaccharide (PPSV23) vaccine. When PCV13 is also indicated, PCV13 should be obtained first. All adults aged 58 years and older should be immunized. An adult younger than age 1 years who has certain medical conditions should be immunized. Any person who resides in a nursing home or long-term care facility should be immunized. An adult smoker should be immunized. People with an immunocompromised condition and certain other conditions should receive both PCV13 and PPSV23 vaccines. People with human immunodeficiency virus (HIV) infection should be immunized as soon as possible after diagnosis. Immunization during chemotherapy or radiation therapy should be avoided. Routine use of PPSV23 vaccine is not recommended for American Indians, [place] Natives, or people younger than 65 years unless there are medical conditions that require PPSV23 vaccine. When indicated, people who have unknown immunization and have no record of immunization should receive PPSV23 vaccine. One-time revaccination 5 years after the first dose of PPSV23 is recommended for people aged 19-64 years who have chronic  kidney failure, nephrotic syndrome, asplenia, or immunocompromised conditions. People who received 1-2 doses of PPSV23 before age 11 years should receive another dose of PPSV23 vaccine at age 70 years or later if at least 5 years have passed since the previous dose. Doses of PPSV23 are not needed for people immunized with PPSV23 at or after age 65 years.   Preventive Services / Frequency  Ages 56 years and over  Blood pressure check.  Lipid and cholesterol check.  Lung cancer screening. / Every year if you are aged 16-80 years and have a 30-pack-year history of smoking and currently smoke or have quit within the past 15 years. Yearly screening is stopped once you have quit smoking for at least 15 years or develop a health problem that would prevent you from having lung cancer treatment.  Clinical breast exam.** / Every year after age 59 years.   BRCA-related cancer risk assessment.** / For women who have family members with a BRCA-related cancer (breast, ovarian, tubal, or peritoneal cancers).  Mammogram.** / Every year beginning at age 70 years and continuing for as long as you are in good health. Consult with your health care provider.  Pap test.** / Every 3 years starting at age 32 years through age 28 or 9 years with 3 consecutive normal Pap tests. Testing can be stopped between 65 and 70 years with 3 consecutive normal Pap tests and no abnormal Pap or HPV tests in the past 10 years.  Fecal occult blood test (FOBT) of stool. / Every year beginning at age 57 years and continuing until age 29 years. You may not need to  do this test if you get a colonoscopy every 10 years.  Flexible sigmoidoscopy or colonoscopy.** / Every 5 years for a flexible sigmoidoscopy or every 10 years for a colonoscopy beginning at age 21 years and continuing until age 56 years.  Hepatitis C blood test.** / For all people born from [birth year] through 1964 and any individual with known risks for hepatitis  C.  Osteoporosis screening.** / A one-time screening for women ages 53 years and over and women at risk for fractures or osteoporosis.  Skin self-exam. / Monthly.  Influenza vaccine. / Every year.  Tetanus, diphtheria, and acellular pertussis (Tdap/Td) vaccine.** / 1 dose of Td every 10 years.  Zoster vaccine.** / 1 dose for adults aged 32 years or older.  Pneumococcal 13-valent conjugate (PCV13) vaccine.** / Consult your health care provider.  Pneumococcal polysaccharide (PPSV23) vaccine.** / 1 dose for all adults aged 67 years and older. Screening for abdominal aortic aneurysm (AAA)  by ultrasound is recommended for people who have history of high blood pressure or who are current or former smokers. ++++++++++++++++++++ Recommend Adult Low Dose Aspirin or  coated  Aspirin 81 mg daily  To reduce risk of Colon Cancer 40 %,  Skin Cancer 26 % ,  Melanoma 46%  and  Pancreatic cancer 60% ++++++++++++++++++++ Vitamin D goal  is between 70-100.  Please make sure that you are taking your Vitamin D as directed.  It is very important as a natural anti-inflammatory  helping hair, skin, and nails, as well as reducing stroke and heart attack risk.  It helps your bones and helps with mood. It also decreases numerous cancer risks so please take it as directed.  Low Vit D is associated with a 200-300% higher risk for CANCER  and 200-300% higher risk for HEART   ATTACK  &  STROKE.   .....................................Marland Kitchen It is also associated with higher death rate at younger ages,  autoimmune diseases like Rheumatoid arthritis, Lupus, Multiple Sclerosis.    Also many other serious conditions, like depression, Alzheimer's Dementia, infertility, muscle aches, fatigue, fibromyalgia - just to name a few. ++++++++++++++++++ Recommend the book "The END of DIETING" by Dr Excell Seltzer  & the book "The END of DIABETES " by Dr Excell Seltzer At [email or web address] - get book & Audio CD's    Being diabetic has  a  300% increased risk for heart attack, stroke, cancer, and alzheimer- type vascular dementia. It is very important that you work harder with diet by avoiding all foods that are white. Avoid white rice (brown & wild rice is OK), white potatoes (sweetpotatoes in moderation is OK), White bread or wheat bread or anything made out of white flour like bagels, donuts, rolls, buns, biscuits, cakes, pastries, cookies, pizza crust, and pasta (made from white flour & egg whites) - vegetarian pasta or spinach or wheat pasta is OK. Multigrain breads like Arnold's or [employer], or multigrain sandwich thins or flatbreads.  Diet, exercise and weight loss can reverse and cure diabetes in the early stages.  Diet, exercise and weight loss is very important in the control and prevention of complications of diabetes which affects every system in your body, ie. Brain - dementia/stroke, eyes - glaucoma/blindness, heart - heart attack/heart failure, kidneys - dialysis, stomach - gastric paralysis, intestines - malabsorption, nerves - severe painful neuritis, circulation - gangrene & loss of a leg(s), and finally cancer and Alzheimers.    I recommend avoid fried & greasy foods,  sweets/candy, white rice (brown or wild  rice or Quinoa is OK), white potatoes (sweet potatoes are OK) - anything made from white flour - bagels, doughnuts, rolls, buns, biscuits,white and wheat breads, pizza crust and traditional pasta made of white flour & egg white(vegetarian pasta or spinach or wheat pasta is OK).  Multi-grain bread is OK - like multi-grain flat bread or sandwich thins. Avoid alcohol in excess. Exercise is also important.    Eat all the vegetables you want - avoid meat, especially red meat and dairy - especially cheese.  Cheese is the most concentrated form of trans-fats which is the worst thing to clog up our arteries. Veggie cheese is OK which can be found in the fresh produce section at [employer] or [employer] or  [employer]  +++++++++++++++++++ DASH Eating Plan  DASH stands for "Dietary Approaches to Stop Hypertension."   The DASH eating plan is a healthy eating plan that has been shown to reduce high blood pressure (hypertension). Additional health benefits may include reducing the risk of type 2 diabetes mellitus, heart disease, and stroke. The DASH eating plan may also help with weight loss. WHAT DO I NEED TO KNOW ABOUT THE DASH EATING PLAN? For the DASH eating plan, you will follow these general guidelines:  Choose foods with a percent daily value for sodium of less than 5% (as listed on the food label).  Use salt-free seasonings or herbs instead of table salt or sea salt.  Check with your health care provider or pharmacist before using salt substitutes.  Eat lower-sodium products, often labeled as "lower sodium" or "no salt added."  Eat fresh foods.  Eat more vegetables, fruits, and low-fat dairy products.  Choose whole grains. Look for the word "whole" as the first word in the ingredient list.  Choose fish   Limit sweets, desserts, sugars, and sugary drinks.  Choose heart-healthy fats.  Eat veggie cheese   Eat more home-cooked food and less restaurant, buffet, and fast food.  Limit fried foods.  Cook foods using methods other than frying.  Limit canned vegetables. If you do use them, rinse them well to decrease the sodium.  When eating at a restaurant, ask that your food be prepared with less salt, or no salt if possible.                      WHAT FOODS CAN I EAT? Read Dr Fara Olden Fuhrman's books on The End of Dieting & The End of Diabetes  Grains Whole grain or whole wheat bread. Brown rice. Whole grain or whole wheat pasta. Quinoa, bulgur, and whole grain cereals. Low-sodium cereals. Corn or whole wheat flour tortillas. Whole grain cornbread. Whole grain crackers. Low-sodium crackers.  Vegetables Fresh or frozen vegetables (raw, steamed, roasted, or grilled). Low-sodium or  reduced-sodium tomato and vegetable juices. Low-sodium or reduced-sodium tomato sauce and paste. Low-sodium or reduced-sodium canned vegetables.   Fruits All fresh, canned (in natural juice), or frozen fruits.  Protein Products  All fish and seafood.  Dried beans, peas, or lentils. Unsalted nuts and seeds. Unsalted canned beans.  Dairy Low-fat dairy products, such as skim or 1% milk, 2% or reduced-fat cheeses, low-fat ricotta or cottage cheese, or plain low-fat yogurt. Low-sodium or reduced-sodium cheeses.  Fats and Oils Tub margarines without trans fats. Light or reduced-fat mayonnaise and salad dressings (reduced sodium). Avocado. Safflower, olive, or canola oils. Natural peanut or almond butter.  Other Unsalted popcorn and pretzels. The items listed above may not be a complete list of recommended foods  or beverages. Contact your dietitian for more options.  +++++++++++++++  WHAT FOODS ARE NOT RECOMMENDED? Grains/ White flour or wheat flour White bread. White pasta. White rice. Refined cornbread. Bagels and croissants. Crackers that contain trans fat.  Vegetables  Creamed or fried vegetables. Vegetables in a . Regular canned vegetables. Regular canned tomato sauce and paste. Regular tomato and vegetable juices.  Fruits Dried fruits. Canned fruit in light or heavy syrup. Fruit juice.  Meat and Other Protein Products Meat in general - RED meat & White meat.  Fatty cuts of meat. Ribs, chicken wings, all processed meats as bacon, sausage, bologna, salami, fatback, hot dogs, bratwurst and packaged luncheon meats.  Dairy Whole or 2% milk, cream, half-and-half, and cream cheese. Whole-fat or sweetened yogurt. Full-fat cheeses or blue cheese. Non-dairy creamers and whipped toppings. Processed cheese, cheese spreads, or cheese curds.  Condiments Onion and garlic salt, seasoned salt, table salt, and sea salt. Canned and packaged gravies. Worcestershire sauce. Tartar sauce. Barbecue  sauce. Teriyaki sauce. Soy sauce, including reduced sodium. Steak sauce. Fish sauce. Oyster sauce. Cocktail sauce. Horseradish. Ketchup and mustard. Meat flavorings and tenderizers. Bouillon cubes. Hot sauce. Tabasco sauce. Marinades. Taco seasonings. Relishes.  Fats and Oils Butter, stick margarine, lard, shortening and bacon fat. Coconut, palm kernel, or palm oils. Regular salad dressings.  Pickles and olives. Salted popcorn and pretzels.  The items listed above may not be a complete list of foods and beverages to avoid.    

## 2020-07-25 ENCOUNTER — Encounter: Payer: Self-pay | Admitting: Internal Medicine

## 2020-07-25 ENCOUNTER — Ambulatory Visit (INDEPENDENT_AMBULATORY_CARE_PROVIDER_SITE_OTHER): Payer: Medicare Other | Admitting: Internal Medicine

## 2020-07-25 ENCOUNTER — Other Ambulatory Visit: Payer: Self-pay

## 2020-07-25 VITALS — BP 136/80 | HR 74 | Temp 97.5°F | Ht 64.5 in | Wt 231.0 lb

## 2020-07-25 DIAGNOSIS — Z87891 Personal history of nicotine dependence: Secondary | ICD-10-CM | POA: Diagnosis not present

## 2020-07-25 DIAGNOSIS — Z1211 Encounter for screening for malignant neoplasm of colon: Secondary | ICD-10-CM

## 2020-07-25 DIAGNOSIS — Z79899 Other long term (current) drug therapy: Secondary | ICD-10-CM

## 2020-07-25 DIAGNOSIS — Z136 Encounter for screening for cardiovascular disorders: Secondary | ICD-10-CM

## 2020-07-25 DIAGNOSIS — I10 Essential (primary) hypertension: Secondary | ICD-10-CM

## 2020-07-25 DIAGNOSIS — E559 Vitamin D deficiency, unspecified: Secondary | ICD-10-CM

## 2020-07-25 DIAGNOSIS — K219 Gastro-esophageal reflux disease without esophagitis: Secondary | ICD-10-CM

## 2020-07-25 DIAGNOSIS — R7309 Other abnormal glucose: Secondary | ICD-10-CM

## 2020-07-25 DIAGNOSIS — E782 Mixed hyperlipidemia: Secondary | ICD-10-CM

## 2020-07-25 DIAGNOSIS — F419 Anxiety disorder, unspecified: Secondary | ICD-10-CM

## 2020-07-25 DIAGNOSIS — Z8249 Family history of ischemic heart disease and other diseases of the circulatory system: Secondary | ICD-10-CM

## 2020-07-25 DIAGNOSIS — Z6841 Body Mass Index (BMI) 40.0 and over, adult: Secondary | ICD-10-CM | POA: Diagnosis not present

## 2020-07-25 MED ORDER — ESCITALOPRAM OXALATE 10 MG PO TABS: ORAL_TABLET | ORAL | 1 refills | Status: DC

## 2020-07-26 ENCOUNTER — Other Ambulatory Visit: Payer: Self-pay | Admitting: Internal Medicine

## 2020-07-26 DIAGNOSIS — K219 Gastro-esophageal reflux disease without esophagitis: Secondary | ICD-10-CM

## 2020-07-26 MED ORDER — PANTOPRAZOLE SODIUM 40 MG PO TBEC: DELAYED_RELEASE_TABLET | ORAL | 0 refills | Status: DC

## 2020-07-26 NOTE — Progress Notes (Signed)
========================================================== -   Test results slightly outside the reference range are not unusual. If there is anything important, I will review this with you,  otherwise it is considered normal test values.  If you have further questions,  please do not hesitate to contact me at the office or via My Chart.  ==========================================================  -  Total Chol = 110 and LDL = 40  - Both  Excellent   - Very low risk for Heart Attack  / Stroke ========================================================  - A1c= 5.8% - slightly elevated, So . . . . . . . . . .  (No Diabetes)  - Avoid Sweets, Candy & White Stuff   - Rice, Potatoes, Breads &  Pasta ========================================================= ..................................................................Marland Kitchen                           Vitamin D  = 77 -Excellent

## 2020-07-28 LAB — COMPLETE METABOLIC PANEL WITH GFR
AG Ratio: 2 (calc) (ref 1.0–2.5)
ALT: 20 U/L (ref 6–29)
AST: 14 U/L (ref 10–35)
Albumin: 4 g/dL (ref 3.6–5.1)
Alkaline phosphatase (APISO): 57 U/L (ref 37–153)
BUN/Creatinine Ratio: 31 (calc) — ABNORMAL HIGH (ref 6–22)
BUN: 29 mg/dL — ABNORMAL HIGH (ref 7–25)
CO2: 29 mmol/L (ref 20–32)
Calcium: 8.9 mg/dL (ref 8.6–10.4)
Chloride: 108 mmol/L (ref 98–110)
Creat: 0.94 mg/dL (ref 0.50–0.99)
GFR, Est African American: 72 mL/min/{1.73_m2} (ref >=60)
GFR, Est Non African American: 62 mL/min/{1.73_m2} (ref >=60)
Globulin: 2 g/dL (calc) (ref 1.9–3.7)
Glucose, Bld: 104 mg/dL — ABNORMAL HIGH (ref 65–99)
Potassium: 4.3 mmol/L (ref 3.5–5.3)
Sodium: 144 mmol/L (ref 135–146)
Total Bilirubin: 0.5 mg/dL (ref 0.2–1.2)
Total Protein: 6 g/dL — ABNORMAL LOW (ref 6.1–8.1)

## 2020-07-28 LAB — CBC WITH DIFFERENTIAL/PLATELET
Absolute Monocytes: 374 cells/uL (ref 200–950)
Basophils Absolute: 61 cells/uL (ref 0–200)
Basophils Relative: 0.9 %
Eosinophils Absolute: 184 cells/uL (ref 15–500)
Eosinophils Relative: 2.7 %
HCT: 42.9 % (ref 35.0–45.0)
Hemoglobin: 13.8 g/dL (ref 11.7–15.5)
Lymphs Abs: 1972 cells/uL (ref 850–3900)
MCH: 28.8 pg (ref 27.0–33.0)
MCHC: 32.2 g/dL (ref 32.0–36.0)
MCV: 89.4 fL (ref 80.0–100.0)
MPV: 11.7 fL (ref 7.5–12.5)
Monocytes Relative: 5.5 %
Neutro Abs: 4209 cells/uL (ref 1500–7800)
Neutrophils Relative %: 61.9 %
Platelets: 200 10*3/uL (ref 140–400)
RBC: 4.8 10*6/uL (ref 3.80–5.10)
RDW: 12.4 % (ref 11.0–15.0)
Total Lymphocyte: 29 %
WBC: 6.8 10*3/uL (ref 3.8–10.8)

## 2020-07-28 LAB — MICROALBUMIN / CREATININE URINE RATIO
Creatinine, Urine: 126 mg/dL (ref 20–275)
Microalb Creat Ratio: 6 mcg/mg creat (ref <30)
Microalb, Ur: 0.7 mg/dL

## 2020-07-28 LAB — URINALYSIS, ROUTINE W REFLEX MICROSCOPIC
Bilirubin Urine: NEGATIVE
Glucose, UA: NEGATIVE
Hgb urine dipstick: NEGATIVE
Ketones, ur: NEGATIVE
Leukocytes,Ua: NEGATIVE
Nitrite: NEGATIVE
Protein, ur: NEGATIVE
Specific Gravity, Urine: 1.022 (ref 1.001–1.03)
pH: <=5 (ref 5.0–8.0)

## 2020-07-28 LAB — MAGNESIUM: Magnesium: 2.3 mg/dL (ref 1.5–2.5)

## 2020-07-28 LAB — INSULIN, RANDOM: Insulin: 15.6 u[IU]/mL

## 2020-07-28 LAB — VITAMIN D 25 HYDROXY (VIT D DEFICIENCY, FRACTURES): Vit D, 25-Hydroxy: 77 ng/mL (ref 30–100)

## 2020-07-28 LAB — HEMOGLOBIN A1C
Hgb A1c MFr Bld: 5.8 % of total Hgb — ABNORMAL HIGH (ref <5.7)
Mean Plasma Glucose: 120 mg/dL
eAG (mmol/L): 6.6 mmol/L

## 2020-07-28 LAB — LIPID PANEL
Cholesterol: 110 mg/dL (ref <200)
HDL: 55 mg/dL (ref >=50)
LDL Cholesterol (Calc): 40 mg/dL (calc)
Non-HDL Cholesterol (Calc): 55 mg/dL (calc) (ref <130)
Total CHOL/HDL Ratio: 2 (calc) (ref <5.0)
Triglycerides: 70 mg/dL (ref <150)

## 2020-07-28 LAB — TSH: TSH: 1.74 mIU/L (ref 0.40–4.50)

## 2020-10-14 ENCOUNTER — Other Ambulatory Visit: Payer: Self-pay

## 2020-10-14 DIAGNOSIS — Z1211 Encounter for screening for malignant neoplasm of colon: Secondary | ICD-10-CM

## 2020-10-14 LAB — POC HEMOCCULT BLD/STL (HOME/3-CARD/SCREEN)
Card #2 Fecal Occult Blod, POC: NEGATIVE
Card #3 Fecal Occult Blood, POC: NEGATIVE
Fecal Occult Blood, POC: NEGATIVE

## 2020-10-15 DIAGNOSIS — Z1212 Encounter for screening for malignant neoplasm of rectum: Secondary | ICD-10-CM | POA: Diagnosis not present

## 2020-10-15 DIAGNOSIS — Z1211 Encounter for screening for malignant neoplasm of colon: Secondary | ICD-10-CM | POA: Diagnosis not present

## 2020-10-28 NOTE — Progress Notes (Signed)
FOLLOW UP  Assessment and Plan:   CAD with other angina (HCC) Dr. Royann Shivers follows; has nitroglycerine PRN, recently impred Control blood pressure, cholesterol (LDL <70), glucose, increase exercise.   Hypertension Well controlled with current medications  Monitor blood pressure at home; patient to call if consistently greater than 130/80 Continue DASH diet.   Reminder to go to the ER if any CP, SOB, nausea, dizziness, severe HA, changes vision/speech, left arm numbness and tingling and jaw pain.  Cholesterol Currently at goal; rosuvastatin 20 mg every other day, zetia 10 mg due to myalgias and needing aggressive control Continue low cholesterol diet and exercise.  Check lipid panel.   Other abnormal glucose Recent A1Cs at goal Discussed diet/exercise, weight management  Defer A1C; check CMP  Morbid obesity - BMI 35+ with co morbidities Commended on progress thus far Long discussion about weight loss, diet, and exercise Recommended diet heavy in fruits and veggies and low in animal meats, cheeses, and dairy products, appropriate calorie intake Discussed ideal weight for height and initial weight goal (<200lb) Start back on weight watchers, try to increase exercise Will follow up in 3 months  Vitamin D Def At goal at last visit; continue supplementation to maintain goal of 70-100  Depression/anxiety Worsening with mom's declining health and pandemic; has benzo but uses rarely Will do trial of wellbutrin, discussed AE's to stop, info given.  Also discussed light therapy for the patient to try Lifestyle discussed: diet/exerise, sleep hygiene, stress management, hydration   Continue diet and meds as discussed. Further disposition pending results of labs. Discussed med's effects and SE's.   Over 30 minutes of exam, counseling, chart review, and critical decision making was performed.   Future Appointments  Date Time Provider Department Center  12/26/2020 10:00 AM Croitoru,  Rachelle Hora, MD [facility]  02/04/2021  9:30 AM [name], MD [facility] None  08/11/2021  3:00 PM [name], MD [facility] None    ----------------------------------------------------------------------------------------------------------------------  HPI 70 y.o. female  presents for 3 month follow up on hypertension, cholesterol, glucose management, weight and vitamin D deficiency.   Mom passed with cancer and demenia 08/2020, glad no longer suffering; she is now on wellbutrin and doing much better. She has taken ambien 5 mg nightly for sleep and has done well for many years. Very rare valvium use.   she has a diagnosis of GERD which is currently managed by protonix 40 mg daily (failed transition to famotidine). Well controlled.   BMI is Body mass index is 37.69 kg/m., she has been working on diet and exercise, back on weight watchers and doing well, down 9 lbs, next goal is <200 lb,  Wt Readings from Last 3 Encounters:  10/29/20 223 lb (101.2 kg)  07/25/20 231 lb (104.8 kg)  04/03/20 229 lb 9.6 oz (104.1 kg)   October 2020 she underwent a coronary CT angiogram.  She has a very high calcium score of 598 (96th percentile).  She had several moderate stenoses including 50% in the left main, 50-69% in the proximal LAD, 40-59% in the left circumflex. Cardiac catheterization performed in October 2020 showed "minimal luminal irregularities, no obstructive CAD". She has had intermittent angina episodes associated with stress/anxiety that respond to nitroglycerine. Recently improved. Dr. Royann Shivers follows.   Her blood pressure has been controlled at home, today their BP is BP: 138/82  She does not workout. She denies chest pain, shortness of breath, dizziness.   She is on cholesterol medication, taking Rosuvastatin 20 mg every other day (had  myalgias with daily) and zetia 10 mg daily and denies myalgias. Her cholesterol is at goal. The cholesterol last visit was:   Lab Results   Component Value Date   CHOL 110 07/25/2020   HDL 55 07/25/2020   LDLCALC 40 07/25/2020   TRIG 70 07/25/2020   CHOLHDL 2.0 07/25/2020    She has been working on diet and exercise for glucose management, and denies increased appetite, nausea, paresthesia of the feet, polydipsia, polyuria and visual disturbances. Last A1C in the office was:  Lab Results  Component Value Date   HGBA1C 5.8 (H) 07/25/2020   Patient is on Vitamin D supplement.   Lab Results  Component Value Date   VD25OH 77 07/25/2020       Current Medications:  Current Outpatient Medications on File Prior to Visit  Medication Sig  . aspirin 81 MG tablet Take 81 mg by mouth daily.  Marland Kitchen buPROPion (WELLBUTRIN XL) 150 MG 24 hr tablet Take 1 tablet (150 mg total) by mouth every morning.  . calcipotriene (DOVONOX) 0.005 % ointment Apply 1 application topically as needed (Hands).  . cephALEXin (KEFLEX) 500 MG capsule Take 500 mg by mouth as needed.  . cetirizine (ZYRTEC) 10 MG tablet Take 10 mg by mouth daily.  . Cholecalciferol (VITAMIN D3) 5000 UNITS CAPS Take 5,000 Units by mouth 4 (four) times a week. Takes M,W,TH,F  . diazepam (VALIUM) 10 MG tablet Take 1/2-1 tablet at Bedtime ONLY if needed for Sleep &  limit to 5 days /week to avoid addiction (Patient taking differently: Take 5-10 mg by mouth daily as needed for anxiety or sleep.)  . ezetimibe (ZETIA) 10 MG tablet Take 1 tablet (10 mg total) by mouth daily.  . fluticasone (FLONASE) 50 MCG/ACT nasal spray Place 1 spray into both nostrils daily as needed for allergies or rhinitis.  . Magnesium 250 MG TABS Take 250 mg by mouth daily.  . Multiple Vitamins-Minerals (MULTIVITAMIN WITH MINERALS) tablet Take 1 tablet by mouth daily.  . naproxen sodium (ALEVE) 220 MG tablet Take 440 mg by mouth as needed (Knees).  . nitroGLYCERIN (NITROSTAT) 0.4 MG SL tablet PLACE 1 TABLET (0.4 MG TOTAL) UNDER THE TONGUE EVERY 5 (FIVE) MINUTES AS NEEDED FOR CHEST PAIN.  Marland Kitchen pantoprazole (PROTONIX)  40 MG tablet Take     1/2 tablet (20 mg)       4 x /week (Patient taking differently: Take 40 mg by mouth daily.)  . rosuvastatin (CRESTOR) 40 MG tablet Take 1 tablet daily for Cholesterol (Patient taking differently: Take 20 mg by mouth See admin instructions. Take four times a week)  . triamterene-hydrochlorothiazide (MAXZIDE-25) 37.5-25 MG tablet TAKE 1 TABLET BY MOUTH EVERYDAY AT BEDTIME  . zolpidem (AMBIEN) 5 MG tablet TAKE 1/2 TO 1 TABLET BY MOUTH AT BEDTIME ONE HOUR BEFORE SLEEP   No current facility-administered medications on file prior to visit.     Allergies:  Allergies  Allergen Reactions  . Doxycycline Other (See Comments)    Jittery and causes nausea  . Levaquin [Levofloxacin In D5w]     yeast  . Lexapro [Escitalopram Oxalate]     Had wheezing and palpitations  . Oruvail [Ketoprofen]     GI upset     Medical History:  Past Medical History:  Diagnosis Date  . ANAL FISSURE, HX OF 11/06/2007   Qualifier: Diagnosis of  By: Misty Stanley CMA (AAMA), Marchelle Folks    . Anxiety   . Depression   . Difficulty sleeping    takes Ambien  .  Diverticulosis 5/1/5  . DJD (degenerative joint disease)   . Family history of anesthesia complication    "mother quit breathing" 30 yrs ago pt does not know any more details. Pt has never had any problems with anesthesia herself  . GERD (gastroesophageal reflux disease)   . Hyperlipidemia   . Hypertension   . Obese   . Peripheral edema   . Psoriasis    HANDS  . Psoriasis   . 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 Unspecified vitamin D deficiency   . Urgency of urination    Family history- Reviewed and unchanged Social history- Reviewed and unchanged  Review of Systems:  Review of Systems  Constitutional: Negative for malaise/fatigue and weight loss.  HENT: Negative for hearing loss and tinnitus.   Eyes: Negative for blurred vision and double vision.  Respiratory: Negative for cough, shortness of breath and wheezing.   Cardiovascular: Negative for  chest pain, palpitations, orthopnea, claudication and leg swelling.  Gastrointestinal: Negative for abdominal pain, blood in stool, constipation, diarrhea, heartburn, melena, nausea and vomiting.  Genitourinary: Negative.   Musculoskeletal: Positive for joint pain (bilateral knees, chronic). Negative for myalgias.  Skin: Negative for rash.  Neurological: Negative for dizziness, tingling, sensory change, weakness and headaches.  Endo/Heme/Allergies: Negative for polydipsia.  Psychiatric/Behavioral: Negative for depression (improved with med), substance abuse and suicidal ideas. The patient is not nervous/anxious and does not have insomnia (well managed by [place]).   All other systems reviewed and are negative.    Physical Exam: BP 138/82   Pulse 80   Temp (!) 97 F (36.1 C)   Wt 223 lb (101.2 kg)   SpO2 97%   BMI 37.69 kg/m  Wt Readings from Last 3 Encounters:  10/29/20 223 lb (101.2 kg)  07/25/20 231 lb (104.8 kg)  04/03/20 229 lb 9.6 oz (104.1 kg)   General Appearance: Well nourished, in no apparent distress. Eyes: PERRLA, EOMs, conjunctiva no swelling or erythema Sinuses: No Frontal/maxillary tenderness ENT/Mouth: Ext aud canals clear, TMs without erythema, bulging. No erythema, swelling, or exudate on post pharynx.  Tonsils not swollen or erythematous. Hearing normal.  Neck: Supple, thyroid normal.  Respiratory: Respiratory effort normal, BS equal bilaterally without rales, rhonchi, wheezing or stridor.  Cardio: RRR with no MRGs. Brisk peripheral pulses without edema.  Abdomen: Soft, + BS.  Non tender, no guarding, rebound, hernias, masses. Lymphatics: Non tender without lymphadenopathy.  Musculoskeletal: Full ROM, 5/5 strength, antalgic gait gait Skin: Warm, dry without rashes, lesions, ecchymosis.  Neuro: Cranial nerves intact. No cerebellar symptoms.  Psych: Awake and oriented X 3, normal affect, Insight and Judgment appropriate.    Dan Maker, NP 11:47  AM [facility]

## 2020-10-29 ENCOUNTER — Ambulatory Visit (INDEPENDENT_AMBULATORY_CARE_PROVIDER_SITE_OTHER): Payer: Medicare Other | Admitting: Adult Health

## 2020-10-29 ENCOUNTER — Other Ambulatory Visit: Payer: Self-pay

## 2020-10-29 ENCOUNTER — Encounter: Payer: Self-pay | Admitting: Adult Health

## 2020-10-29 VITALS — BP 138/82 | HR 80 | Temp 97.0°F | Wt 223.0 lb

## 2020-10-29 DIAGNOSIS — E7800 Pure hypercholesterolemia, unspecified: Secondary | ICD-10-CM | POA: Diagnosis not present

## 2020-10-29 DIAGNOSIS — R7309 Other abnormal glucose: Secondary | ICD-10-CM

## 2020-10-29 DIAGNOSIS — I25118 Atherosclerotic heart disease of native coronary artery with other forms of angina pectoris: Secondary | ICD-10-CM | POA: Diagnosis not present

## 2020-10-29 DIAGNOSIS — F3341 Major depressive disorder, recurrent, in partial remission: Secondary | ICD-10-CM

## 2020-10-29 DIAGNOSIS — F419 Anxiety disorder, unspecified: Secondary | ICD-10-CM | POA: Diagnosis not present

## 2020-10-29 DIAGNOSIS — I10 Essential (primary) hypertension: Secondary | ICD-10-CM

## 2020-10-29 DIAGNOSIS — E782 Mixed hyperlipidemia: Secondary | ICD-10-CM | POA: Diagnosis not present

## 2020-10-29 DIAGNOSIS — E559 Vitamin D deficiency, unspecified: Secondary | ICD-10-CM | POA: Diagnosis not present

## 2020-10-29 DIAGNOSIS — E538 Deficiency of other specified B group vitamins: Secondary | ICD-10-CM

## 2020-10-29 MED ORDER — ROSUVASTATIN CALCIUM 20 MG PO TABS: ORAL_TABLET | ORAL | 3 refills | Status: DC

## 2020-10-30 ENCOUNTER — Other Ambulatory Visit: Payer: Self-pay | Admitting: Adult Health

## 2020-10-30 DIAGNOSIS — N289 Disorder of kidney and ureter, unspecified: Secondary | ICD-10-CM

## 2020-10-30 LAB — COMPLETE METABOLIC PANEL WITH GFR
AG Ratio: 2.3 (calc) (ref 1.0–2.5)
ALT: 17 U/L (ref 6–29)
AST: 15 U/L (ref 10–35)
Albumin: 4.3 g/dL (ref 3.6–5.1)
Alkaline phosphatase (APISO): 53 U/L (ref 37–153)
BUN/Creatinine Ratio: 30 (calc) — ABNORMAL HIGH (ref 6–22)
BUN: 35 mg/dL — ABNORMAL HIGH (ref 7–25)
CO2: 29 mmol/L (ref 20–32)
Calcium: 9.6 mg/dL (ref 8.6–10.4)
Chloride: 106 mmol/L (ref 98–110)
Creat: 1.17 mg/dL — ABNORMAL HIGH (ref 0.50–0.99)
GFR, Est African American: 55 mL/min/{1.73_m2} — ABNORMAL LOW (ref >=60)
GFR, Est Non African American: 48 mL/min/{1.73_m2} — ABNORMAL LOW (ref >=60)
Globulin: 1.9 g/dL (calc) (ref 1.9–3.7)
Glucose, Bld: 102 mg/dL — ABNORMAL HIGH (ref 65–99)
Potassium: 4.6 mmol/L (ref 3.5–5.3)
Sodium: 144 mmol/L (ref 135–146)
Total Bilirubin: 0.4 mg/dL (ref 0.2–1.2)
Total Protein: 6.2 g/dL (ref 6.1–8.1)

## 2020-10-30 LAB — CBC WITH DIFFERENTIAL/PLATELET
Absolute Monocytes: 466 cells/uL (ref 200–950)
Basophils Absolute: 59 cells/uL (ref 0–200)
Basophils Relative: 0.8 %
Eosinophils Absolute: 215 cells/uL (ref 15–500)
Eosinophils Relative: 2.9 %
HCT: 45.9 % — ABNORMAL HIGH (ref 35.0–45.0)
Hemoglobin: 14.3 g/dL (ref 11.7–15.5)
Lymphs Abs: 2102 cells/uL (ref 850–3900)
MCH: 27.8 pg (ref 27.0–33.0)
MCHC: 31.2 g/dL — ABNORMAL LOW (ref 32.0–36.0)
MCV: 89.1 fL (ref 80.0–100.0)
MPV: 11.9 fL (ref 7.5–12.5)
Monocytes Relative: 6.3 %
Neutro Abs: 4558 cells/uL (ref 1500–7800)
Neutrophils Relative %: 61.6 %
Platelets: 211 10*3/uL (ref 140–400)
RBC: 5.15 10*6/uL — ABNORMAL HIGH (ref 3.80–5.10)
RDW: 12.7 % (ref 11.0–15.0)
Total Lymphocyte: 28.4 %
WBC: 7.4 10*3/uL (ref 3.8–10.8)

## 2020-10-30 LAB — LIPID PANEL
Cholesterol: 105 mg/dL (ref <200)
HDL: 51 mg/dL (ref >=50)
LDL Cholesterol (Calc): 37 mg/dL (calc)
Non-HDL Cholesterol (Calc): 54 mg/dL (calc) (ref <130)
Total CHOL/HDL Ratio: 2.1 (calc) (ref <5.0)
Triglycerides: 83 mg/dL (ref <150)

## 2020-10-30 LAB — TSH: TSH: 1.91 mIU/L (ref 0.40–4.50)

## 2020-10-30 LAB — VITAMIN B12: Vitamin B-12: 633 pg/mL (ref 200–1100)

## 2020-10-30 LAB — MAGNESIUM: Magnesium: 2.2 mg/dL (ref 1.5–2.5)

## 2020-11-10 ENCOUNTER — Telehealth: Payer: Self-pay | Admitting: Adult Health

## 2020-11-10 NOTE — Telephone Encounter (Signed)
Patient called to report she tested positive for COVID today. Requesting [place] to recheck labs on 11/12/20 be rescheduled .  Please advise when to reschedule.

## 2020-11-12 ENCOUNTER — Ambulatory Visit: Payer: Medicare Other

## 2020-11-26 ENCOUNTER — Ambulatory Visit (INDEPENDENT_AMBULATORY_CARE_PROVIDER_SITE_OTHER): Payer: Medicare Other

## 2020-11-26 ENCOUNTER — Other Ambulatory Visit: Payer: Self-pay

## 2020-11-26 DIAGNOSIS — N289 Disorder of kidney and ureter, unspecified: Secondary | ICD-10-CM | POA: Diagnosis not present

## 2020-11-26 NOTE — Progress Notes (Signed)
Patient presents to the office for a nurse visit to have labs done to check kidney functions. Vitals taken and recorded.

## 2020-11-27 LAB — BASIC METABOLIC PANEL WITH GFR
BUN/Creatinine Ratio: 31 (calc) — ABNORMAL HIGH (ref 6–22)
BUN: 27 mg/dL — ABNORMAL HIGH (ref 7–25)
CO2: 27 mmol/L (ref 20–32)
Calcium: 9 mg/dL (ref 8.6–10.4)
Chloride: 108 mmol/L (ref 98–110)
Creat: 0.87 mg/dL (ref 0.50–0.99)
GFR, Est African American: 79 mL/min/{1.73_m2} (ref >=60)
GFR, Est Non African American: 68 mL/min/{1.73_m2} (ref >=60)
Glucose, Bld: 95 mg/dL (ref 65–99)
Potassium: 4.6 mmol/L (ref 3.5–5.3)
Sodium: 144 mmol/L (ref 135–146)

## 2020-12-04 ENCOUNTER — Other Ambulatory Visit: Payer: Self-pay | Admitting: Adult Health

## 2020-12-04 ENCOUNTER — Other Ambulatory Visit: Payer: Self-pay | Admitting: Adult Health Nurse Practitioner

## 2020-12-04 DIAGNOSIS — G4700 Insomnia, unspecified: Secondary | ICD-10-CM

## 2020-12-04 NOTE — Progress Notes (Signed)
Future Appointments  Date Time Provider Department Center  12/26/2020 10:00 AM Croitoru, Rachelle Hora, MD [facility]  03/04/2021 10:30 AM Judd Gaudier, NP [facility] None  08/11/2021  3:00 PM [name], MD [facility] None    PDMP reviewed for [facility] refill request.

## 2020-12-08 ENCOUNTER — Other Ambulatory Visit: Payer: Self-pay | Admitting: Internal Medicine

## 2020-12-08 DIAGNOSIS — Z1231 Encounter for screening mammogram for malignant neoplasm of breast: Secondary | ICD-10-CM

## 2020-12-26 ENCOUNTER — Encounter: Payer: Self-pay | Admitting: Cardiovascular Disease

## 2020-12-26 ENCOUNTER — Ambulatory Visit (INDEPENDENT_AMBULATORY_CARE_PROVIDER_SITE_OTHER): Payer: Medicare Other | Admitting: Cardiovascular Disease

## 2020-12-26 ENCOUNTER — Other Ambulatory Visit: Payer: Self-pay

## 2020-12-26 VITALS — BP 138/82 | HR 78 | Ht 65.0 in | Wt 225.0 lb

## 2020-12-26 DIAGNOSIS — I25118 Atherosclerotic heart disease of native coronary artery with other forms of angina pectoris: Secondary | ICD-10-CM

## 2020-12-26 DIAGNOSIS — I10 Essential (primary) hypertension: Secondary | ICD-10-CM

## 2020-12-26 DIAGNOSIS — E7800 Pure hypercholesterolemia, unspecified: Secondary | ICD-10-CM | POA: Diagnosis not present

## 2020-12-26 DIAGNOSIS — Z6837 Body mass index (BMI) 37.0-37.9, adult: Secondary | ICD-10-CM | POA: Diagnosis not present

## 2020-12-26 MED ORDER — NITROGLYCERIN 0.4 MG SL SUBL: 0.4000 mg | SUBLINGUAL_TABLET | SUBLINGUAL | 1 refills | Status: DC | PRN

## 2020-12-26 NOTE — Patient Instructions (Signed)
Medication Instructions:  No changes *If you need a refill on your cardiac medications before your next appointment, please call your pharmacy*   Lab Work: None ordered If you have labs (blood work) drawn today and your tests are completely normal, you will receive your results only by: MyChart Message (if you have MyChart) OR A paper copy in the mail If you have any lab test that is abnormal or we need to change your treatment, we will call you to review the results.   Testing/Procedures: None ordered   Follow-Up: At [facility], you and your health needs are our priority.  As part of our continuing mission to provide you with exceptional heart care, we have created designated Provider Care Teams.  These Care Teams include your primary Cardiologist (physician) and Advanced Practice Providers (APPs -  Physician Assistants and Nurse Practitioners) who all work together to provide you with the care you need, when you need it.  We recommend signing up for the patient portal called "MyChart".  Sign up information is provided on this After Visit Summary.  MyChart is used to connect with patients for Virtual Visits (Telemedicine).  Patients are able to view lab/test results, encounter notes, upcoming appointments, etc.  Non-urgent messages can be sent to your provider as well.   To learn more about what you can do with MyChart, go to [email or web address].    Your next appointment:   12 month(s)  The format for your next appointment:   In Person  Provider:   You may see Thurmon Fair, MD or one of the following Advanced Practice Providers on your designated Care Team:   Azalee Course, PA-C Micah Flesher, [place] or  Judy Pimple, [place]   Other Instructions Dr. Royann Shivers would like you to check your blood pressure daily for the next 2 weeks.  Keep a journal of these daily blood pressure and heart rate readings and call our office or send a message through MyChart with the results. Thank  you!  It is [place] to check your BP 1-2 hours after taking your medications to see the medications effectiveness on your BP.    Here are some tips that our clinical pharmacists share for home BP monitoring:          Rest 10 minutes before taking your blood pressure.          Don't smoke or drink caffeinated beverages for at least 30 minutes before.          Take your blood pressure before (not after) you eat.          Sit comfortably with your back supported and both feet on the floor (don't cross your legs).          Elevate your arm to heart level on a table or a desk.          Use the proper sized cuff. It should fit smoothly and snugly around your bare upper arm. There should be enough room to slip a fingertip under the cuff. The bottom edge of the cuff should be 1 inch above the crease of the elbow.

## 2020-12-26 NOTE — Progress Notes (Signed)
Cardiology Office Note    Date:  12/26/2020   ID:  Jade CapriceJacqueline Thornlow Goheen, DOB 01/07/1951, MRN [id number]  PCP:  Lucky CowboyMcKeown, William, MD  Cardiologist:   Thurmon FairMihai Jenafer Winterton, MD   Chief Complaint  Patient presents with   Follow-up    1 year.   Coronary Artery Disease    History of Present Illness:  Jade Singh is a 70 y.o. female with moderate CAD, hypertension, hyperlipidemia, obesity.    For the most part she is doing well.  She is only had to take nitroglycerin once in the last year.  As before, her chest discomfort is not associated with exertion, but occurs when she becomes very emotional.  Her elderly mother passed away on Valentine's Day and she still becomes teary-eyed when she talks about this.  Her blood pressure is borderline high today, but typically at home is around 130s/70s.  She does not have exertional angina pectoris and denies dyspnea at rest or with activity, lower extremity edema, orthopnea, PND, palpitations, dizziness, syncope, lower extremity edema or claudication or focal neurological events.  In October 2020 she underwent a coronary CT angiogram.  She has a very high calcium score of 598 (96th percentile).  She had several moderate stenoses including 50% in the left main, 50-69% in the proximal LAD, 40-59% in the left circumflex.  However by fractional flow reserve the only significant stenosis was in the distal LAD (FFR 0.75).  Cardiac catheterization performed in October 2020 showed "minimal luminal irregularities, no obstructive CAD".  Excellent LDL cholesterol on combination of rosuvastatin and Zetia.  Borderline hemoglobin A1c of 5.8%.  Normal renal function.  Past Medical History:  Diagnosis Date   ANAL FISSURE, HX OF 11/06/2007   Qualifier: Diagnosis of  By: Misty StanleyLewellyn CMA (AAMA), Amanda     Anxiety    Depression    Difficulty sleeping    takes Ambien   Diverticulosis 5/1/5   DJD (degenerative joint disease)    Family history of anesthesia  complication    "mother quit breathing" 30 yrs ago pt does not know any more details. 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   Unspecified vitamin D deficiency    Urgency of urination     Past Surgical History:  Procedure Laterality Date   ABDOMINAL HYSTERECTOMY     partial   APPENDECTOMY     CARDIAC CATHETERIZATION  2005    no problems per pt    KNEE ARTHROSCOPY     RT 2011, LT 2009   LEFT HEART CATH AND CORONARY ANGIOGRAPHY N/A 05/11/2019   Procedure: LEFT HEART CATH AND CORONARY ANGIOGRAPHY;  Surgeon: Iran OuchArida, Muhammad A, MD;  Location: [facility];  Service: Cardiovascular;  Laterality: N/A;   TONSILLECTOMY AND ADENOIDECTOMY     TOTAL KNEE ARTHROPLASTY Right 02/28/2014   Procedure: RIGHT TOTAL KNEE ARTHROPLASTY;  Surgeon: Javier DockerJeffrey C Beane, MD;  Location: [facility];  Service: Orthopedics;  Laterality: Right;   TUBAL LIGATION      Current Medications: Outpatient Medications Prior to Visit  Medication Sig Dispense Refill   aspirin 81 MG tablet Take 81 mg by mouth daily.     buPROPion (WELLBUTRIN XL) 150 MG 24 hr tablet Take 1 tablet (150 mg total) by mouth  every morning. 90 tablet 2   calcipotriene (DOVONOX) 0.005 % ointment Apply 1 application topically as needed (Hands).     cephALEXin (KEFLEX) 500 MG capsule Take 500 mg by mouth as needed.     cetirizine (ZYRTEC) 10 MG tablet Take 10 mg by mouth daily.     Cholecalciferol (VITAMIN D3) 5000 UNITS CAPS Take 5,000 Units by mouth 4 (four) times a week. Takes M,W,TH,F     diazepam (VALIUM) 10 MG tablet Take 1/2-1 tablet at Bedtime ONLY if needed for Sleep &  limit to 5 days /week to avoid addiction (Patient taking differently: Take 5-10 mg by mouth daily as needed for anxiety or sleep.) 90 tablet 0   ezetimibe (ZETIA) 10 MG tablet Take 1 tablet (10  mg total) by mouth daily. 90 tablet 3   Magnesium 250 MG TABS Take 250 mg by mouth daily.     Multiple Vitamins-Minerals (MULTIVITAMIN WITH MINERALS) tablet Take 1 tablet by mouth daily.     naproxen sodium (ALEVE) 220 MG tablet Take 440 mg by mouth as needed (Knees).     pantoprazole (PROTONIX) 40 MG tablet Take     1/2 tablet (20 mg)       4 x /week (Patient taking differently: Take 40 mg by mouth daily.) 52 tablet 0   rosuvastatin (CRESTOR) 20 MG tablet Take 1 tab every other day for cholesterol. 45 tablet 3   triamterene-hydrochlorothiazide (MAXZIDE-25) 37.5-25 MG tablet TAKE 1 TABLET BY MOUTH EVERYDAY AT BEDTIME 90 tablet 3   zolpidem (AMBIEN) 5 MG tablet TAKE 1 TABLET BY MOUTH ONE HOUR BEFORE AT BEDTIME FOR SLEEP 90 tablet 0   nitroGLYCERIN (NITROSTAT) 0.4 MG SL tablet PLACE 1 TABLET (0.4 MG TOTAL) UNDER THE TONGUE EVERY 5 (FIVE) MINUTES AS NEEDED FOR CHEST PAIN. 75 tablet 1   fluticasone (FLONASE) 50 MCG/ACT nasal spray Place 1 spray into both nostrils daily as needed for allergies or rhinitis. (Patient not taking: Reported on 12/26/2020)     No facility-administered medications prior to visit.     Allergies:   Doxycycline, Levaquin [levofloxacin in d5w], Lexapro [escitalopram oxalate], and Oruvail [ketoprofen]   Social History   Socioeconomic History   Marital status: Married    Spouse name: Not on file   Number of children: Not on file   Years of education: Not on file   Highest education level: Not on file  Occupational History   Not on file  Tobacco Use   Smoking status: Former    Pack years: 0.00    Types: Cigarettes    Quit date: 07/19/2000    Years since quitting: 20.4   Smokeless tobacco: Never  Substance and Sexual Activity   Alcohol use: Yes    Comment: occ   Drug use: No   Sexual activity: Not on file  Other Topics Concern   Not on file  Social History Narrative   Not on file   Social Determinants of Health   Financial Resource Strain: Not on file  Food  Insecurity: Not on file  Transportation Needs: Not on file  Physical Activity: Not on file  Stress: Not on file  Social Connections: Not on file     Family History:  The patient's family history includes Cancer (age of onset: 75) in her brother; Diabetes in her paternal uncle; Heart attack in her maternal grandfather and mother; Heart disease in her paternal grandfather; Hyperlipidemia in her mother; Stroke in her mother.   ROS:   Please see the  history of present illness.    ROS All other systems reviewed and are negative.   PHYSICAL EXAM:   VS:  BP 138/82 (BP Location: Left Arm, Patient Position: Sitting, Cuff Size: Large)   Pulse 78   Ht 5\' 5"  (1.651 m)   Wt 225 lb (102.1 kg)   BMI 37.44 kg/m      General: Alert, oriented x3, no distress, severely obese Head: no evidence of trauma, PERRL, EOMI, no exophtalmos or lid lag, no myxedema, no xanthelasma; normal ears, nose and oropharynx Neck: normal jugular venous pulsations and no hepatojugular reflux; brisk carotid pulses without delay and no carotid bruits Chest: clear to auscultation, no signs of consolidation by percussion or palpation, normal fremitus, symmetrical and full respiratory excursions Cardiovascular: normal position and quality of the apical impulse, regular rhythm, normal first and second heart sounds, no murmurs, rubs or gallops Abdomen: no tenderness or distention, no masses by palpation, no abnormal pulsatility or arterial bruits, normal bowel sounds, no hepatosplenomegaly Extremities: no clubbing, cyanosis or edema; 2+ radial, ulnar and brachial pulses bilaterally; 2+ right femoral, posterior tibial and dorsalis pedis pulses; 2+ left femoral, posterior tibial and dorsalis pedis pulses; no subclavian or femoral bruits Neurological: grossly nonfocal Psych: Normal mood and affect    Wt Readings from Last 3 Encounters:  12/26/20 225 lb (102.1 kg)  11/26/20 225 lb (102.1 kg)  10/29/20 223 lb (101.2 kg)       Studies/Labs Reviewed:   EKG:  EKG is not ordered today.  Personally reviewed tracing from 07/25/2020 shows sinus rhythm with PACs but is otherwise a normal tracing.    Recent Labs: 10/29/2020: ALT 17; Hemoglobin 14.3; Magnesium 2.2; Platelets 211; TSH 1.91 11/26/2020: BUN 27; Creat 0.87; Potassium 4.6; Sodium 144   Lipid Panel    Component Value Date/Time   CHOL 105 10/29/2020 1204   TRIG 83 10/29/2020 1204   HDL 51 10/29/2020 1204   CHOLHDL 2.1 10/29/2020 1204   VLDL 20 12/02/2016 1532   LDLCALC 37 10/29/2020 1204      ASSESSMENT:    1. Coronary artery disease involving native coronary artery of native heart with other form of angina pectoris (HCC)   2. Essential hypertension   3. Hypercholesterolemia   4. Class 2 severe obesity due to excess calories with serious comorbidity and body mass index (BMI) of 37.0 to 37.9 in adult [facility])      PLAN:  In order of problems listed above:  CAD: Markedly reduced episodes of chest discomfort and need for sublingual nitroglycerin.  Never has exertional symptoms despite being physically active.  It is possible that she has coronary vasospasm superimposed on fixed moderate CAD.  I recommend continued conservative management with aspirin, aggressive lipid-lowering, nitroglycerin as needed (at this point this happens a handful of times a year.  If the symptoms become more frequent would add a calcium channel blocker.  If she develops exertional symptoms can add amlodipine 2.5 mg daily or isosorbide mononitrate 30 mg daily. HTN: Adequate control usually.  Asked her to keep a log for couple weeks and send them to me through MyChart.  If her blood pressure is high would add amlodipine for its potential benefit for coronary vasospasm. HLP: Excellent LDL cholesterol.  Continue statin and ezetimibe GERD: Esophageal reflux and/or esophageal spasm could also explain her nonexertional pain; differential diagnosis with coronary vasospasm Obesity:  Continue efforts to lose weight.     Medication Adjustments/Labs and Tests Ordered: Current medicines are reviewed at length with  the patient today.  Concerns regarding medicines are outlined above.  Medication changes, Labs and Tests ordered today are listed in the Patient Instructions below. Patient Instructions  Medication Instructions:  No changes *If you need a refill on your cardiac medications before your next appointment, please call your pharmacy*   Lab Work: None ordered If you have labs (blood work) drawn today and your tests are completely normal, you will receive your results only by: MyChart Message (if you have MyChart) OR A paper copy in the mail If you have any lab test that is abnormal or we need to change your treatment, we will call you to review the results.   Testing/Procedures: None ordered   Follow-Up: At [facility], you and your health needs are our priority.  As part of our continuing mission to provide you with exceptional heart care, we have created designated Provider Care Teams.  These Care Teams include your primary Cardiologist (physician) and Advanced Practice Providers (APPs -  Physician Assistants and Nurse Practitioners) who all work together to provide you with the care you need, when you need it.  We recommend signing up for the patient portal called "MyChart".  Sign up information is provided on this After Visit Summary.  MyChart is used to connect with patients for Virtual Visits (Telemedicine).  Patients are able to view lab/test results, encounter notes, upcoming appointments, etc.  Non-urgent messages can be sent to your provider as well.   To learn more about what you can do with MyChart, go to [email or web address].    Your next appointment:   12 month(s)  The format for your next appointment:   In Person  Provider:   You may see Thurmon Fair, MD or one of the following Advanced Practice Providers on your designated Care Team:    Azalee Course, PA-C Micah Flesher, [place] or  Judy Pimple, [place]   Other Instructions Dr. Royann Shivers would like you to check your blood pressure daily for the next 2 weeks.  Keep a journal of these daily blood pressure and heart rate readings and call our office or send a message through MyChart with the results. Thank you!  It is [place] to check your BP 1-2 hours after taking your medications to see the medications effectiveness on your BP.    Here are some tips that our clinical pharmacists share for home BP monitoring:          Rest 10 minutes before taking your blood pressure.          Don't smoke or drink caffeinated beverages for at least 30 minutes before.          Take your blood pressure before (not after) you eat.          Sit comfortably with your back supported and both feet on the floor (don't cross your legs).          Elevate your arm to heart level on a table or a desk.          Use the proper sized cuff. It should fit smoothly and snugly around your bare upper arm. There should be enough room to slip a fingertip under the cuff. The bottom edge of the cuff should be 1 inch above the crease of the elbow.    Signed, Thurmon Fair, MD  12/26/2020 12:09 PM    [facility] [address] [place], [place], [place]  [address] Phone: [phone]; Fax: [phone]

## 2021-01-08 DIAGNOSIS — M1712 Unilateral primary osteoarthritis, left knee: Secondary | ICD-10-CM | POA: Diagnosis not present

## 2021-01-08 DIAGNOSIS — M170 Bilateral primary osteoarthritis of knee: Secondary | ICD-10-CM | POA: Diagnosis not present

## 2021-02-03 ENCOUNTER — Other Ambulatory Visit: Payer: Self-pay

## 2021-02-03 ENCOUNTER — Ambulatory Visit
Admission: RE | Admit: 2021-02-03 | Discharge: 2021-02-03 | Disposition: A | Discharge status: Home / Self Care | Payer: Medicare Other | Source: Ambulatory Visit | Attending: Internal Medicine | Admitting: Internal Medicine

## 2021-02-03 DIAGNOSIS — Z1231 Encounter for screening mammogram for malignant neoplasm of breast: Secondary | ICD-10-CM

## 2021-02-04 ENCOUNTER — Ambulatory Visit: Payer: Medicare Other | Admitting: Internal Medicine

## 2021-02-09 DIAGNOSIS — M1712 Unilateral primary osteoarthritis, left knee: Secondary | ICD-10-CM | POA: Diagnosis not present

## 2021-02-09 DIAGNOSIS — M25562 Pain in left knee: Secondary | ICD-10-CM | POA: Diagnosis not present

## 2021-02-16 DIAGNOSIS — M1712 Unilateral primary osteoarthritis, left knee: Secondary | ICD-10-CM | POA: Diagnosis not present

## 2021-02-23 DIAGNOSIS — M25562 Pain in left knee: Secondary | ICD-10-CM | POA: Diagnosis not present

## 2021-02-23 DIAGNOSIS — M1712 Unilateral primary osteoarthritis, left knee: Secondary | ICD-10-CM | POA: Diagnosis not present

## 2021-02-25 ENCOUNTER — Other Ambulatory Visit: Payer: Self-pay | Admitting: Cardiovascular Disease

## 2021-03-04 ENCOUNTER — Ambulatory Visit: Payer: Medicare Other | Admitting: Nurse Practitioner

## 2021-03-05 ENCOUNTER — Encounter: Payer: Self-pay | Admitting: Adult Health

## 2021-03-05 ENCOUNTER — Other Ambulatory Visit: Payer: Self-pay

## 2021-03-05 ENCOUNTER — Ambulatory Visit (INDEPENDENT_AMBULATORY_CARE_PROVIDER_SITE_OTHER): Payer: Medicare Other | Admitting: Adult Health

## 2021-03-05 VITALS — BP 136/82 | HR 81 | Temp 97.1°F | Resp 16 | Ht 65.0 in | Wt 229.6 lb

## 2021-03-05 DIAGNOSIS — K5790 Diverticulosis of intestine, part unspecified, without perforation or abscess without bleeding: Secondary | ICD-10-CM | POA: Diagnosis not present

## 2021-03-05 DIAGNOSIS — R7309 Other abnormal glucose: Secondary | ICD-10-CM

## 2021-03-05 DIAGNOSIS — G4700 Insomnia, unspecified: Secondary | ICD-10-CM

## 2021-03-05 DIAGNOSIS — F3341 Major depressive disorder, recurrent, in partial remission: Secondary | ICD-10-CM | POA: Diagnosis not present

## 2021-03-05 DIAGNOSIS — F419 Anxiety disorder, unspecified: Secondary | ICD-10-CM | POA: Diagnosis not present

## 2021-03-05 DIAGNOSIS — E7800 Pure hypercholesterolemia, unspecified: Secondary | ICD-10-CM | POA: Diagnosis not present

## 2021-03-05 DIAGNOSIS — I25118 Atherosclerotic heart disease of native coronary artery with other forms of angina pectoris: Secondary | ICD-10-CM | POA: Diagnosis not present

## 2021-03-05 DIAGNOSIS — Z0001 Encounter for general adult medical examination with abnormal findings: Secondary | ICD-10-CM | POA: Diagnosis not present

## 2021-03-05 DIAGNOSIS — I10 Essential (primary) hypertension: Secondary | ICD-10-CM

## 2021-03-05 DIAGNOSIS — R6889 Other general symptoms and signs: Secondary | ICD-10-CM | POA: Diagnosis not present

## 2021-03-05 DIAGNOSIS — E559 Vitamin D deficiency, unspecified: Secondary | ICD-10-CM

## 2021-03-05 DIAGNOSIS — E782 Mixed hyperlipidemia: Secondary | ICD-10-CM | POA: Diagnosis not present

## 2021-03-05 DIAGNOSIS — K219 Gastro-esophageal reflux disease without esophagitis: Secondary | ICD-10-CM | POA: Diagnosis not present

## 2021-03-05 DIAGNOSIS — K589 Irritable bowel syndrome without diarrhea: Secondary | ICD-10-CM | POA: Diagnosis not present

## 2021-03-05 DIAGNOSIS — Z0000 Encounter for general adult medical examination without abnormal findings: Secondary | ICD-10-CM

## 2021-03-05 MED ORDER — BUPROPION HCL ER (XL) 300 MG PO TB24: 300.0000 mg | ORAL_TABLET | ORAL | 2 refills | Status: DC

## 2021-03-05 MED ORDER — ROSUVASTATIN CALCIUM 20 MG PO TABS: ORAL_TABLET | ORAL | 3 refills | Status: DC

## 2021-03-05 MED ORDER — ZOLPIDEM TARTRATE 5 MG PO TABS: ORAL_TABLET | ORAL | 0 refills | Status: DC

## 2021-03-05 NOTE — Patient Instructions (Addendum)
  Please check with insurance/pharmacy about shingrix - 2 shots 2-6 months apart  Flu shot due in October 2022  Restart weight watchers weekly with sister as accountability partner  Look into water aerobics - regular classes 2-3 days/week to get you out of the house, might make new friends Set goal to walk or other exercise 30 mg daily while watching TV - have a non-food related reward at the end Strength training, resistance bands, etc are excellent while sitting Avoid eating while watching TV. If hungry, sit down at kitchen table and eat there.    Zoster Vaccine, Recombinant injection What is this medication? ZOSTER VACCINE (ZOS ter vak SEEN) is a vaccine used to reduce the risk of getting shingles. This vaccine is not used to treat shingles or nerve pain fromshingles. This medicine may be used for other purposes; ask your health care provider orpharmacist if you have questions. COMMON BRAND NAME(S): [facility] What should I tell my care team before I take this medication? They need to know if you have any of these conditions: cancer immune system problems an unusual or allergic reaction to Zoster vaccine, other medications, foods, dyes, or preservatives pregnant or trying to get pregnant breast-feeding How should I use this medication? This vaccine is injected into a muscle. It is given by a health care provider. A copy of Vaccine Information Statements will be given before each vaccination. Be sure to read this information carefully each time. This sheet may changeoften. Talk to your health care provider about the use of this vaccine in children.This vaccine is not approved for use in children. Overdosage: If you think you have taken too much of this medicine contact apoison control center or emergency room at once. NOTE: This medicine is only for you. Do not share this medicine with others. What if I miss a dose? Keep appointments for follow-up (booster) doses. It is important not  to miss your dose. Call your health care provider if you are unable to keep anappointment. What may interact with this medication? medicines that suppress your immune system medicines to treat cancer steroid medicines like prednisone or cortisone This list may not describe all possible interactions. Give your health care provider a list of all the medicines, herbs, non-prescription drugs, or dietary supplements you use. Also tell them if you smoke, drink alcohol, or use illegaldrugs. Some items may interact with your medicine. What should I watch for while using this medication? Visit your health care provider regularly. This vaccine, like all vaccines, may not fully protect everyone. What side effects may I notice from receiving this medication? Side effects that you should report to your doctor or health care professionalas soon as possible: allergic reactions (skin rash, itching or hives; swelling of the face, lips, or tongue) trouble breathing Side effects that usually do not require medical attention (report these toyour doctor or health care professional if they continue or are bothersome): chills headache fever nausea pain, redness, or irritation at site where injected tiredness vomiting This list may not describe all possible side effects. Call your doctor for medical advice about side effects. You may report side effects to FDA at1-800-FDA-1088. Where should I keep my medication? This vaccine is only given by a health care provider. It will not be stored athome. NOTE: This sheet is a summary. It may not cover all possible information. If you have questions about this medicine, talk to your doctor, pharmacist, orhealth care provider.  2022 Elsevier/Gold Standard (2019-08-10 16:23:07)

## 2021-03-05 NOTE — Progress Notes (Signed)
MEDICARE ANNUAL WELLNESS VISIT AND FOLLOW UP  Assessment:   Annual Medicare Wellness Visit Due annually  Health maintenance reviewed  CAD with other angina (HCC) Dr. Royann Shivers follows; has nitroglycerine PRN, recently impred Control blood pressure, cholesterol (LDL <70), glucose, increase exercise.   Essential hypertension Continue medications Follows with Cardiology Monitor blood pressure at home; call if consistently over 130/80 Continue DASH diet.   Reminder to go to the ER if any CP, SOB, nausea, dizziness, severe HA, changes vision/speech, left arm numbness and tingling and jaw pain. Followed by cardiology as well  Irritable bowel syndrome, unspecified type Avoid triggers, add soluble fiber if not taking regularly  Gastroesophageal reflux disease, esophagitis presence not specified Well managed on current medications: pantropazole Discussed diet, avoiding triggers and other lifestyle changes  Diverticulosis Increase soluble fiber, UTD on colonoscopies Doing well today  Vitamin D deficiency Continue supplementation Check vitamin D level  Prediabetes Discussed disease and risks Discussed diet/exercise, weight management  -A1C  Hypercholesterolemia Continue medications:Zetia10mg  and rosuvastatin 20mg  4 days/week Continue low cholesterol diet and exercise.  -Lipids  Recurrent major depressive disorder, in full remission (HCC) Continue medications: Wellbutrin XL 150mg  daily Lifestyle discussed: diet/exerise, sleep hygiene, stress management, hydration  Anxiety state Well managed by current regimen;  uses benzo rarely Stress management techniques discussed, increase water, good sleep hygiene discussed, increase exercise, and increase veggies.   Anemia, unspecified type -CBC  Morbid obesity, BMI 38 Long discussion about weight loss, diet, and exercise Recommended diet heavy in fruits and veggies and low in animal meats, cheeses, and dairy products, appropriate  calorie intake Patient will work on restarting weight watchers, sister will help with accountability Exercise suggestions reviewed, planning into day, look into water aerobics Discussed appropriate weight for height  Follow up at next visit  Medication management Continued  Over 40 minutes of face to face exam, counseling, chart review and critical decision making was performed.  Future Appointments  Date Time Provider Department Center  08/11/2021  3:00 PM , MD [facility] None  03/11/2022 11:00 AM [name], NP [facility] None     Plan:   During the course of the visit the patient was educated and counseled about appropriate screening and preventive services including:   Pneumococcal vaccine  Prevnar 13 Influenza vaccine Td vaccine Screening electrocardiogram Bone densitometry screening Colorectal cancer screening Diabetes screening Glaucoma screening Nutrition counseling  Advanced directives: requested   Subjective:  Jade Singh is a 70 y.o. female who presents for Medicare Annual Wellness Visit and 3 month follow up. has Anxiety; Major depression in partial remission (HCC); IBS; Vitamin D deficiency; Hypertension; Hypercholesterolemia; GERD (gastroesophageal reflux disease); Morbid obesity (HCC); Other abnormal glucose; Diverticulosis; and Atherosclerotic heart disease of native coronary artery with other forms of angina pectoris (HCC) on their problem list.  Mom passed with cancer and demenia 08/2020, glad no longer suffering; she is now on wellbutrin and doing much better. She has taken ambien 5 mg nightly for sleep and has done well for many years. Reports failed several other gents. Very rare valium use.   she has a diagnosis of GERD which is currently managed by protonix 40 mg daily (failed transition to famotidine). Well controlled.   BMI is Body mass index is 38.21 kg/m., she has been working on diet and exercise, back on weight watchers  and doing well, but got off of it, has regained 4 lb. Next goal is <200 lb, She drinks 65+ fluid ounces of water daily. Discussed restarting weight watchers  with sister as accountability partner on Friday. Also has treadmill could use, discussed benefits of water aerobics for ortho concerns.  Wt Readings from Last 3 Encounters:  03/05/21 229 lb 9.6 oz (104.1 kg)  12/26/20 225 lb (102.1 kg)  11/26/20 225 lb (102.1 kg)   October 2020 she underwent a coronary CT angiogram.  She has a very high calcium score of 598 (96th percentile).  She had several moderate stenoses including 50% in the left main, 50-69% in the proximal LAD, 40-59% in the left circumflex. Cardiac catheterization performed in October 2020 showed "minimal luminal irregularities, no obstructive CAD". She has had intermittent angina episodes associated with stress/anxiety that respond to nitroglycerine. Recently improved. Dr. Royann Shiversroitoru follows.   Her blood pressure has been controlled at home (130s/70s), today their BP is BP: 136/82  She does workout. She denies chest pain, shortness of breath, dizziness.   She is on cholesterol medication (rosuvastatin 20 mg MWFSat) and denies myalgias. Her cholesterol is at goal. The cholesterol last visit was:   Lab Results  Component Value Date   CHOL 105 10/29/2020   HDL 51 10/29/2020   LDLCALC 37 10/29/2020   TRIG 83 10/29/2020   CHOLHDL 2.1 10/29/2020    She has been working on diet and exercise for prediabetes, and denies increased appetite, nausea, paresthesia of the feet, polydipsia, polyuria and visual disturbances. Last A1C in the office was:  Lab Results  Component Value Date   HGBA1C 5.8 (H) 07/25/2020    Last GFR:  Lab Results  Component Value Date   GFRNONAA 68 11/26/2020   Patient is on Vitamin D supplement.   Lab Results  Component Value Date   VD25OH 77 07/25/2020       Medication Review: Current Outpatient Medications on File Prior to Visit  Medication Sig Dispense  Refill   aspirin 81 MG tablet Take 81 mg by mouth daily.     calcipotriene (DOVONOX) 0.005 % ointment Apply 1 application topically as needed (Hands).     cephALEXin (KEFLEX) 500 MG capsule Take 500 mg by mouth as needed.     cetirizine (ZYRTEC) 10 MG tablet Take 10 mg by mouth daily.     Cholecalciferol (VITAMIN D3) 5000 UNITS CAPS Take 5,000 Units by mouth 4 (four) times a week. Takes M,W,TH,F     diazepam (VALIUM) 10 MG tablet Take 1/2-1 tablet at Bedtime ONLY if needed for Sleep &  limit to 5 days /week to avoid addiction (Patient taking differently: Take 5-10 mg by mouth daily as needed for anxiety or sleep.) 90 tablet 0   ezetimibe (ZETIA) 10 MG tablet TAKE ONE TABLET BY MOUTH DAILY 90 tablet 3   Magnesium 250 MG TABS Take 250 mg by mouth daily.     Multiple Vitamins-Minerals (MULTIVITAMIN WITH MINERALS) tablet Take 1 tablet by mouth daily.     naproxen sodium (ALEVE) 220 MG tablet Take 440 mg by mouth as needed (Knees).     nitroGLYCERIN (NITROSTAT) 0.4 MG SL tablet Place 1 tablet (0.4 mg total) under the tongue every 5 (five) minutes as needed for chest pain. 25 tablet 1   pantoprazole (PROTONIX) 40 MG tablet Take     1/2 tablet (20 mg)       4 x /week (Patient taking differently: Take 40 mg by mouth daily.) 52 tablet 0   triamterene-hydrochlorothiazide (MAXZIDE-25) 37.5-25 MG tablet TAKE 1 TABLET BY MOUTH EVERYDAY AT BEDTIME 90 tablet 3   No current facility-administered medications on file prior to  visit.    Allergies  Allergen Reactions   Doxycycline Other (See Comments)    Jittery and causes nausea   Levaquin [Levofloxacin In D5w]     yeast   Lexapro [Escitalopram Oxalate]     Had wheezing and palpitations   Oruvail [Ketoprofen]     GI upset    Current Problems (verified) Patient Active Problem List   Diagnosis Date Noted   Atherosclerotic heart disease of native coronary artery with other forms of angina pectoris (HCC) 10/29/2020   Diverticulosis    Other abnormal  glucose 06/25/2014   Vitamin D deficiency    Hypertension    Hypercholesterolemia    GERD (gastroesophageal reflux disease)    Morbid obesity (HCC)    Anxiety 11/06/2007   Major depression in partial remission (HCC) 11/06/2007   IBS 11/06/2007    Screening Tests Immunization History  Administered Date(s) Administered   Fluad Quad(high Dose 65+) 04/02/2019   Influenza Inj Mdck Quad With Preservative 05/17/2017   Influenza Split 06/25/2014, 05/08/2015   Influenza, High Dose Seasonal PF 05/09/2018, 04/09/2020   Influenza,inj,quad, With Preservative 04/13/2016   Influenza-Unspecified 05/28/2013   Moderna Sars-Covid-2 Vaccination 08/24/2019, 09/22/2019, 05/13/2020   PPD Test 08/29/2013   Pneumococcal Conjugate-13 06/08/2017   Pneumococcal Polysaccharide-23 06/07/2013, 06/08/2018   Td 01/02/2020   Tdap 05/19/2009   Zoster, Live 06/25/2014   Tetanus: 12/2019 Pneumovax: 2014, 2019  Prevnar 13: 2018  Flu vaccine: 2021 Zostavax: 2015 - check with insurance  Covid 19: 2/2, last booster 05/13/2020  Pap: S/p hysterectomy MGM: 01/2021 DEXA: 04/2019 T-0.7, normal   Colonoscopy: 2015, Dr. Juanda Chance, 10 year recall EGD: -   Last Eye Exam: [facility], 01/2020, has upcoming in 05/2021 Last Dental Exam: [facility] 7/ 2022 q66months  Patient Care Team: [name], MD as PCP - General (Internal Medicine) Thurmon Fair, MD as PCP - Cardiology (Cardiology) Thurmon Fair, MD as Consulting Physician (Cardiology) Jene Every, MD as Consulting Physician (Orthopedic Surgery)  SURGICAL HISTORY She  has a past surgical history that includes Knee arthroscopy; Appendectomy; Tubal ligation; Abdominal hysterectomy; Tonsillectomy and adenoidectomy; Cardiac catheterization (2005); Total knee arthroplasty (Right, 02/28/2014); and LEFT HEART CATH AND CORONARY ANGIOGRAPHY (N/A, 05/11/2019). FAMILY HISTORY Her family history includes Cancer (age of onset: 85) in her brother; Diabetes in her  paternal uncle; Heart attack in her maternal grandfather and mother; Heart disease in her paternal grandfather; Hyperlipidemia in her mother; Stroke in her mother. SOCIAL HISTORY She  reports that she quit smoking about 20 years ago. Her smoking use included cigarettes. She has never used smokeless tobacco. She reports current alcohol use. She reports that she does not use drugs.   MEDICARE WELLNESS OBJECTIVES: Physical activity: Current Exercise Habits: The patient does not participate in regular exercise at present, Exercise limited by: orthopedic condition(s) Cardiac risk factors: Cardiac Risk Factors include: advanced age (>11men, >25 women);dyslipidemia;hypertension;obesity (BMI >30kg/m2);sedentary lifestyle;smoking/ tobacco exposure Depression/mood screen:   Depression screen [facility] 2/9 03/05/2021  Decreased Interest 0  Down, Depressed, Hopeless 0  PHQ - 2 Score 0  Altered sleeping 0  Tired, decreased energy 1  Change in appetite 2  Feeling bad or failure about yourself  0  Trouble concentrating 0  Moving slowly or fidgety/restless 0  Suicidal thoughts 0  PHQ-9 Score 3  Difficult doing work/chores Not difficult at all    ADLs:  In your present state of health, do you have any difficulty performing the following activities: 03/05/2021 07/24/2020  Hearing? N N  Vision? N N  Difficulty concentrating or making decisions? N N  Walking or climbing stairs? N N  Dressing or bathing? N N  Doing errands, shopping? N N  Some recent data might be hidden     Cognitive Testing  Alert? Yes  Normal Appearance?Yes  Oriented to person? Yes  Place? Yes   Time? Yes  Recall of three objects?  Yes  Can perform simple calculations? Yes  Displays appropriate judgment?Yes  Can read the correct time from a watch face?Yes  EOL planning: Does Patient Have a Medical Advance Directive?: No Would patient like information on creating a medical advance directive?: No - Patient declined  Review of Systems   Constitutional:  Negative for malaise/fatigue and weight loss.  HENT:  Negative for hearing loss and tinnitus.   Eyes:  Negative for blurred vision and double vision.  Respiratory:  Negative for cough, sputum production, shortness of breath and wheezing.   Cardiovascular:  Negative for chest pain, palpitations, orthopnea, claudication, leg swelling and PND.  Gastrointestinal:  Negative for abdominal pain, blood in stool, constipation, diarrhea, heartburn, melena, nausea and vomiting.  Genitourinary: Negative.   Musculoskeletal:  Positive for joint pain (bil knees, ortho follows). Negative for falls and myalgias.  Skin:  Negative for rash.  Neurological:  Negative for dizziness, tingling, sensory change, weakness and headaches.  Endo/Heme/Allergies:  Negative for polydipsia.  Psychiatric/Behavioral:  Negative for depression, memory loss, substance abuse and suicidal ideas. The patient is not nervous/anxious and does not have insomnia (Controlled on ambien).   All other systems reviewed and are negative.   Objective:     Today's Vitals   03/05/21 1059  BP: 136/82  Pulse: 81  Resp: 16  Temp: (!) 97.1 F (36.2 C)  SpO2: 98%  Weight: 229 lb 9.6 oz (104.1 kg)  Height: 5\' 5"  (1.651 m)   Body mass index is 38.21 kg/m.  General appearance: alert, no distress, WD/WN, female HEENT: normocephalic, sclerae anicteric, TMs pearly, nares patent, no discharge or erythema, pharynx normal Oral cavity: MMM, no lesions Neck: supple, no lymphadenopathy, no thyromegaly, no masses Heart: RRR, normal S1, S2, no murmurs Lungs: CTA bilaterally, no wheezes, rhonchi, or rales Abdomen: +bs, soft, obese abdomen limits exam, non tender, non distended, no palpable masses, hepatomegaly, splenomegaly Musculoskeletal: nontender, mild effusion L knee (well healed midline incision), no erythema/heat, no obvious deformity, ROM of R knee limited in flexion Extremities: no edema, no cyanosis, no clubbing Pulses: 2+  symmetric, upper and lower extremities, normal cap refill Neurological: alert, oriented x 3, CN2-12 intact, strength normal upper extremities and lower extremities, sensation normal throughout, DTRs 2+ throughout, no cerebellar signs, gait slow steady Psychiatric: normal affect, behavior normal, pleasant   Medicare Attestation I have personally reviewed: The patient's medical and social history Their use of alcohol, tobacco or illicit drugs Their current medications and supplements The patient's functional ability including ADLs,fall risks, home safety risks, cognitive, and hearing and visual impairment Diet and physical activities Evidence for depression or mood disorders  The patient's weight, height, BMI, and visual acuity have been recorded in the chart.  I have made referrals, counseling, and provided education to the patient based on review of the above and I have provided the patient with a written personalized care plan for preventive services.     , NP   03/05/2021

## 2021-03-06 ENCOUNTER — Other Ambulatory Visit: Payer: Self-pay | Admitting: Adult Health

## 2021-03-06 DIAGNOSIS — N289 Disorder of kidney and ureter, unspecified: Secondary | ICD-10-CM

## 2021-03-06 LAB — CBC WITH DIFFERENTIAL/PLATELET
Absolute Monocytes: 496 cells/uL (ref 200–950)
Basophils Absolute: 80 cells/uL (ref 0–200)
Basophils Relative: 1 %
Eosinophils Absolute: 136 cells/uL (ref 15–500)
Eosinophils Relative: 1.7 %
HCT: 45.8 % — ABNORMAL HIGH (ref 35.0–45.0)
Hemoglobin: 14.4 g/dL (ref 11.7–15.5)
Lymphs Abs: 2384 cells/uL (ref 850–3900)
MCH: 28.3 pg (ref 27.0–33.0)
MCHC: 31.4 g/dL — ABNORMAL LOW (ref 32.0–36.0)
MCV: 90.2 fL (ref 80.0–100.0)
MPV: 11.6 fL (ref 7.5–12.5)
Monocytes Relative: 6.2 %
Neutro Abs: 4904 cells/uL (ref 1500–7800)
Neutrophils Relative %: 61.3 %
Platelets: 209 10*3/uL (ref 140–400)
RBC: 5.08 10*6/uL (ref 3.80–5.10)
RDW: 12.5 % (ref 11.0–15.0)
Total Lymphocyte: 29.8 %
WBC: 8 10*3/uL (ref 3.8–10.8)

## 2021-03-06 LAB — COMPLETE METABOLIC PANEL WITH GFR
AG Ratio: 2.1 (calc) (ref 1.0–2.5)
ALT: 17 U/L (ref 6–29)
AST: 13 U/L (ref 10–35)
Albumin: 4.2 g/dL (ref 3.6–5.1)
Alkaline phosphatase (APISO): 56 U/L (ref 37–153)
BUN/Creatinine Ratio: 20 (calc) (ref 6–22)
BUN: 25 mg/dL (ref 7–25)
CO2: 26 mmol/L (ref 20–32)
Calcium: 9.5 mg/dL (ref 8.6–10.4)
Chloride: 108 mmol/L (ref 98–110)
Creat: 1.25 mg/dL — ABNORMAL HIGH (ref 0.50–1.05)
Globulin: 2 g/dL (calc) (ref 1.9–3.7)
Glucose, Bld: 87 mg/dL (ref 65–99)
Potassium: 4.8 mmol/L (ref 3.5–5.3)
Sodium: 146 mmol/L (ref 135–146)
Total Bilirubin: 0.4 mg/dL (ref 0.2–1.2)
Total Protein: 6.2 g/dL (ref 6.1–8.1)
eGFR: 47 mL/min/{1.73_m2} — ABNORMAL LOW (ref >=60)

## 2021-03-06 LAB — LIPID PANEL
Cholesterol: 136 mg/dL (ref <200)
HDL: 66 mg/dL (ref >=50)
LDL Cholesterol (Calc): 53 mg/dL (calc)
Non-HDL Cholesterol (Calc): 70 mg/dL (calc) (ref <130)
Total CHOL/HDL Ratio: 2.1 (calc) (ref <5.0)
Triglycerides: 88 mg/dL (ref <150)

## 2021-03-06 LAB — HEMOGLOBIN A1C
Hgb A1c MFr Bld: 5.6 % of total Hgb (ref <5.7)
Mean Plasma Glucose: 114 mg/dL
eAG (mmol/L): 6.3 mmol/L

## 2021-03-06 LAB — TSH: TSH: 1.74 mIU/L (ref 0.40–4.50)

## 2021-03-06 LAB — MAGNESIUM: Magnesium: 2.2 mg/dL (ref 1.5–2.5)

## 2021-03-09 NOTE — Progress Notes (Signed)
Office visit scheduled.

## 2021-04-01 ENCOUNTER — Ambulatory Visit (INDEPENDENT_AMBULATORY_CARE_PROVIDER_SITE_OTHER): Payer: Medicare Other

## 2021-04-01 ENCOUNTER — Other Ambulatory Visit: Payer: Self-pay

## 2021-04-01 DIAGNOSIS — N289 Disorder of kidney and ureter, unspecified: Secondary | ICD-10-CM | POA: Diagnosis not present

## 2021-04-01 NOTE — Progress Notes (Signed)
The patient came in for recheck BMP today. She reports that she is continuing to drink a gallon+ of water a day. She reports that she has not used any NSAIDS to date since last labs/visit. She reports she is feeling well.

## 2021-04-02 LAB — BASIC METABOLIC PANEL WITH GFR
BUN/Creatinine Ratio: 25 (calc) — ABNORMAL HIGH (ref 6–22)
BUN: 26 mg/dL — ABNORMAL HIGH (ref 7–25)
CO2: 25 mmol/L (ref 20–32)
Calcium: 8.7 mg/dL (ref 8.6–10.4)
Chloride: 108 mmol/L (ref 98–110)
Creat: 1.02 mg/dL (ref 0.50–1.05)
Glucose, Bld: 94 mg/dL (ref 65–99)
Potassium: 4.3 mmol/L (ref 3.5–5.3)
Sodium: 143 mmol/L (ref 135–146)
eGFR: 60 mL/min/{1.73_m2} (ref >=60)

## 2021-04-15 ENCOUNTER — Other Ambulatory Visit: Payer: Self-pay | Admitting: Nurse Practitioner

## 2021-04-15 DIAGNOSIS — F3341 Major depressive disorder, recurrent, in partial remission: Secondary | ICD-10-CM

## 2021-04-15 MED ORDER — BUPROPION HCL ER (XL) 150 MG PO TB24: 150.0000 mg | ORAL_TABLET | ORAL | 3 refills | Status: DC

## 2021-04-15 MED ORDER — BUPROPION HCL ER (XL) 300 MG PO TB24: 300.0000 mg | ORAL_TABLET | ORAL | 3 refills | Status: DC

## 2021-04-20 ENCOUNTER — Other Ambulatory Visit: Payer: Self-pay | Admitting: Cardiovascular Disease

## 2021-05-03 ENCOUNTER — Other Ambulatory Visit: Payer: Self-pay | Admitting: Internal Medicine

## 2021-05-03 DIAGNOSIS — K219 Gastro-esophageal reflux disease without esophagitis: Secondary | ICD-10-CM

## 2021-05-28 ENCOUNTER — Other Ambulatory Visit: Payer: Self-pay | Admitting: Adult Health

## 2021-05-28 DIAGNOSIS — G4700 Insomnia, unspecified: Secondary | ICD-10-CM

## 2021-07-06 DIAGNOSIS — M170 Bilateral primary osteoarthritis of knee: Secondary | ICD-10-CM | POA: Diagnosis not present

## 2021-08-11 ENCOUNTER — Other Ambulatory Visit: Payer: Self-pay

## 2021-08-11 ENCOUNTER — Encounter: Payer: Self-pay | Admitting: Internal Medicine

## 2021-08-11 ENCOUNTER — Ambulatory Visit (INDEPENDENT_AMBULATORY_CARE_PROVIDER_SITE_OTHER): Payer: Medicare Other | Admitting: Internal Medicine

## 2021-08-11 VITALS — BP 136/76 | HR 82 | Temp 97.9°F | Resp 16 | Ht 65.0 in | Wt 229.0 lb

## 2021-08-11 DIAGNOSIS — Z87891 Personal history of nicotine dependence: Secondary | ICD-10-CM

## 2021-08-11 DIAGNOSIS — E782 Mixed hyperlipidemia: Secondary | ICD-10-CM | POA: Diagnosis not present

## 2021-08-11 DIAGNOSIS — K219 Gastro-esophageal reflux disease without esophagitis: Secondary | ICD-10-CM

## 2021-08-11 DIAGNOSIS — Z0001 Encounter for general adult medical examination with abnormal findings: Secondary | ICD-10-CM

## 2021-08-11 DIAGNOSIS — E559 Vitamin D deficiency, unspecified: Secondary | ICD-10-CM

## 2021-08-11 DIAGNOSIS — I10 Essential (primary) hypertension: Secondary | ICD-10-CM

## 2021-08-11 DIAGNOSIS — R7309 Other abnormal glucose: Secondary | ICD-10-CM | POA: Diagnosis not present

## 2021-08-11 DIAGNOSIS — Z136 Encounter for screening for cardiovascular disorders: Secondary | ICD-10-CM | POA: Diagnosis not present

## 2021-08-11 DIAGNOSIS — Z79899 Other long term (current) drug therapy: Secondary | ICD-10-CM

## 2021-08-11 DIAGNOSIS — Z8249 Family history of ischemic heart disease and other diseases of the circulatory system: Secondary | ICD-10-CM | POA: Diagnosis not present

## 2021-08-11 DIAGNOSIS — Z1211 Encounter for screening for malignant neoplasm of colon: Secondary | ICD-10-CM

## 2021-08-11 DIAGNOSIS — Z0000 Encounter for general adult medical examination without abnormal findings: Secondary | ICD-10-CM | POA: Diagnosis not present

## 2021-08-11 MED ORDER — GABAPENTIN 100 MG PO CAPS: ORAL_CAPSULE | ORAL | 2 refills | Status: DC

## 2021-08-11 NOTE — Progress Notes (Signed)
Comprehensive Evaluation &  Examination   Future Appointments  Date Time Provider Department  08/11/2021  3:00 PM [name], MD [facility]  03/11/2022             Wellness 11:00 AM Judd Gaudier, NP [facility]  08/16/2022  3:00 PM [name], MD [facility]        This very nice 71 y.o.f  MWF  presents for a comprehensive evaluation and management of multiple medical co-morbidities.  Patient has been followed for HTN, HLD, Prediabetes  and Vitamin D Deficiency. Patient has hx/o GERD controlled on her meds.         HTN predates circa 2011.   Heart cath in 2020 was normal;  Patient's BP has been controlled at home and patient denies any cardiac symptoms as chest pain, palpitations, shortness of breath, dizziness or ankle swelling. Today's BP is at goal - 136/76.       Patient's hyperlipidemia is controlled with diet and  g Crestor / Ezetimibe  : Patient denies myalgias or other medication SE's. Last lipids were at goal :  Lab Results  Component Value Date   CHOL 136 03/05/2021   HDL 66 03/05/2021   LDLCALC 53 03/05/2021   TRIG 88 03/05/2021   CHOLHDL 2.1 03/05/2021         Patient has Morbid Obesity (BMI 39+)  with  consequent hx/o prediabetes (A1c 5.7%  /2015)  and patient denies reactive hypoglycemic symptoms, visual blurring, diabetic polys or paresthesias. Last A1c was  normal & at goal :  Lab Results  Component Value Date   HGBA1C 5.6 03/05/2021         Finally, patient has history of Vitamin D Deficiency and last Vitamin D was at goal :  Lab Results  Component Value Date   VD25OH 77 07/25/2020     Current Outpatient Medications on File Prior to Visit  Medication Sig   aspirin 81 MG tablet Take 81 mg by mouth daily.   buPROPion XL 150 MG 24 hr tablet Take 1 tablet  every morning.   calcipotriene (DOVONOX) 0.005 % ointment Apply 1 application topically as needed (Hands).   cephALEXin (KEFLEX) 500 MG capsule Take as needed.   cetirizine (ZYRTEC) 10 MG  tablet Take daily.   Cholecalciferol (VITAMIN D3) 5000 UNITS CAPS Take 5,000 Units 4 times a week. Takes M,W,TH,F   ezetimibe (ZETIA) 10 MG tablet TAKE ONE TABLET DAILY   Magnesium 250 MG TABS Take 250 mg by mouth daily.   Multiple Vitamins-Minerals  Take 1 tablet by mouth daily.   naproxen sodium (ALEVE) 220 MG tablet Take 440 mg  as needed    NITROSTAT0.4 MG SL tablet as needed for chest pain.   pantoprazole  40 MG tablet TAKE 1 TABLET DAILY    rosuvastatin (CRESTOR) 20 MG tablet Take 1 tab Sun Tues Thurs Sat for cholesterol.   triamterene-hctz(MAXZIDE-25) 37.5-25 MG t TAKE 1 TABLET BY EVERYDAY AT BEDTIME   zolpidem 5 MG tablet TAKE ONE TABLET DAILY 1 HOUR BEFORE BEDTIME      Allergies  Allergen Reactions   Doxycycline Other (See Comments)    Jittery and causes nausea   Levaquin [Levofloxacin In D5w]     yeast   Lexapro [Escitalopram Oxalate]     Had wheezing and palpitations   Oruvail [Ketoprofen]     GI upset     Past Medical History:  Diagnosis Date   ANAL FISSURE, HX OF 11/06/2007   Qualifier: Diagnosis of  By: Julaine Hua CMA Deborra Medina), Amanda     Anxiety    Depression    Difficulty sleeping    takes Ambien   Diverticulosis 5/1/5   DJD (degenerative joint disease)    Family history of anesthesia complication    "mother quit breathing" 30 yrs ago pt does not know any more details. 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   Unspecified vitamin D deficiency    Urgency of urination      Health Maintenance  Topic Date Due   Zoster Vaccines- Shingrix (1 of 2) Never done   MAMMOGRAM  02/03/2022   COLONOSCOPY  11/17/2023   TETANUS/TDAP  01/01/2030   Pneumonia Vaccine 100+ Years old  Completed   INFLUENZA VACCINE  Completed   DEXA SCAN  Completed   COVID-19 Vaccine  Completed   Hepatitis  C Screening  Completed   HPV VACCINES  Aged Out     Immunization History  Administered Date(s) Administered   Fluad Quad(high Dose 65) 04/02/2019   Influenza Inj Mdck Quad  05/17/2017   Influenza  06/25/2014, 05/08/2015   Influenza, High Dose  05/09/2018, 04/09/2020   Influenza,inj,quad 04/13/2016   Influenza 05/28/2013, 07/07/2021   Moderna Covid-19  Bivalent Booster  07/07/2021   Moderna Sars-Covid-2 Vacc 08/24/2019, 09/22/2019, 05/13/2020   PPD Test 08/29/2013   Pneumococcal 13 06/08/2017   Pneumococcal -23 06/07/2013, 06/08/2018   Td 01/02/2020   Tdap 05/19/2009   Zoster, Live 06/25/2014     Last Colon - 2015  Dr Delfin Edis - recc 10 year f/u due May 2025   Last MGM - 02/04/2021   Past Surgical History:  Procedure Laterality Date   ABDOMINAL HYSTERECTOMY     partial   APPENDECTOMY     CARDIAC CATHETERIZATION  2005    no problems per pt    KNEE ARTHROSCOPY     RT 2011, LT 2009   LEFT HEART CATH AND CORONARY ANGIOGRAPHY N/A 05/11/2019   Procedure: LEFT HEART CATH AND CORONARY ANGIOGRAPHY;  Surgeon: Wellington Hampshire, MD;  Location: [facility];  Service: Cardiovascular;  Laterality: N/A;   TONSILLECTOMY AND ADENOIDECTOMY     TOTAL KNEE ARTHROPLASTY Right 02/28/2014   Procedure: RIGHT TOTAL KNEE ARTHROPLASTY;  Surgeon: Johnn Hai, MD;  Location: [facility];  Service: Orthopedics;  Laterality: Right;   TUBAL LIGATION       Family History  Problem Relation Age of Onset   Stroke Mother    Hyperlipidemia Mother    Heart attack Mother    Cancer Brother 53       prostate, deceased   Heart disease Paternal Grandfather    Heart attack Maternal Grandfather    Diabetes Paternal Uncle    Colon cancer Neg Hx    Breast cancer Neg Hx      Social History   Tobacco Use   Smoking status: Former    Types: Cigarettes    Quit date: 07/19/2000    Years since quitting: 21.0   Smokeless tobacco: Never  Substance Use Topics   Alcohol use: Yes    Comment: occ    Drug use: No      ROS Constitutional: Denies fever, chills, weight loss/gain, headaches, insomnia,  night sweats, and change in  appetite. Does c/o fatigue. Eyes: Denies redness, blurred vision, diplopia, discharge, itchy, watery eyes.  ENT: Denies discharge, congestion, post nasal drip, epistaxis, sore throat, earache, hearing loss, dental pain, Tinnitus, Vertigo, Sinus pain, snoring.  Cardio: Denies chest pain, palpitations, irregular heartbeat, syncope, dyspnea, diaphoresis, orthopnea, PND, claudication, edema Respiratory: denies cough, dyspnea, DOE, pleurisy, hoarseness, laryngitis, wheezing.  Gastrointestinal: Denies dysphagia, heartburn, reflux, water brash, pain, cramps, nausea, vomiting, bloating, diarrhea, constipation, hematemesis, melena, hematochezia, jaundice, hemorrhoids Genitourinary: Denies dysuria, frequency, urgency, nocturia, hesitancy, discharge, hematuria, flank pain Breast: Breast lumps, nipple discharge, bleeding.  Musculoskeletal: Denies arthralgia, myalgia, stiffness, Jt. Swelling, pain, limp, and strain/sprain. Denies falls. Skin: Denies puritis, rash, hives, warts, acne, eczema, changing in skin lesion Neuro: No weakness, tremor, incoordination, spasms, paresthesia, pain Psychiatric: Denies confusion, memory loss, sensory loss. Denies Depression. Endocrine: Denies change in weight, skin, hair change, nocturia, and paresthesia, diabetic polys, visual blurring, hyper / hypo glycemic episodes.  Heme/Lymph: No excessive bleeding, bruising, enlarged lymph nodes.  Physical Exam  BP 136/76    Pulse 82    Temp 97.9 F (36.6 C)    Resp 16    Ht 5\' 5"  (1.651 m)    Wt 229 lb (103.9 kg)    SpO2 98%    BMI 38.11 kg/m   General Appearance: Well nourished, well groomed and in no apparent distress.  Eyes: PERRLA, EOMs, conjunctiva no swelling or erythema, normal fundi and vessels. Sinuses: No frontal/maxillary tenderness ENT/Mouth: EACs patent / TMs  nl. Nares clear without  erythema, swelling, mucoid exudates. Oral hygiene is good. No erythema, swelling, or exudate. Tongue normal, non-obstructing. Tonsils not swollen or erythematous. Hearing normal.  Neck: Supple, thyroid not palpable. No bruits, nodes or JVD. Respiratory: Respiratory effort normal.  BS equal and clear bilateral without rales, rhonci, wheezing or stridor. Cardio: Heart sounds are normal with regular rate and rhythm and no murmurs, rubs or gallops. Peripheral pulses are normal and equal bilaterally without edema. No aortic or femoral bruits. Chest: symmetric with normal excursions and percussion. Breasts: Symmetric, without lumps, nipple discharge, retractions, or fibrocystic changes.  Abdomen: Flat, soft with bowel sounds active. Nontender, no guarding, rebound, hernias, masses, or organomegaly.  Lymphatics: Non tender without lymphadenopathy.  Musculoskeletal: Full ROM all peripheral extremities, joint stability, 5/5 strength, and normal gait. Skin: Warm and dry without rashes, lesions, cyanosis, clubbing or  ecchymosis.  Neuro: Cranial nerves intact, reflexes equal bilaterally. Normal muscle tone, no cerebellar symptoms. Sensation intact.  Pysch: Alert and oriented X 3, normal affect, Insight and Judgment appropriate.    Assessment and Plan\  1. Screening Exam    2. Essential hypertension  - EKG 12-Lead - Urinalysis, Routine w reflex microscopic - Microalbumin / creatinine urine ratio - CBC with Differential/Platelet - COMPLETE METABOLIC PANEL WITH GFR - Magnesium - TSH  3. Hyperlipidemia, mixed  - EKG 12-Lead - Lipid panel - TSH  4. Abnormal glucose  - EKG 12-Lead - Hemoglobin A1c - Insulin, random  5. Vitamin D deficiency  - VITAMIN D 25 Hydroxy (Vit-D Deficiency, Fractures)  6. Gastroesophageal reflux disease without esophagitis  - CBC with Differential/Platelet  7. Screening for colorectal cancer  - POC Hemoccult Bld/Stl   8. Screening for ischemic heart  disease  - EKG 12-Lead  9. FHx: heart disease  - EKG 12-Lead  10. Former smoker  - EKG 12-Lead  11. Medication management  - Urinalysis, Routine w reflex microscopic - Microalbumin / creatinine urine ratio - CBC with Differential/Platelet - COMPLETE METABOLIC PANEL WITH  GFR - Magnesium - Lipid panel - TSH - Hemoglobin A1c - Insulin, random - VITAMIN D 25 Hydroxy            Patient was counseled in prudent diet to achieve/maintain BMI less than 25 for weight control, BP monitoring, regular exercise and medications.  After discussion re: dangers of Ambien , patient is agreeable to try Gabapentin for Insomnia. Discussed med's effects and SE's. Screening labs and tests as requested with regular follow-up as recommended. Over 40 minutes of exam, counseling, chart review and high complex critical decision making was performed.   Kirtland Bouchard, MD

## 2021-08-11 NOTE — Patient Instructions (Addendum)
Due to recent changes in healthcare laws, you may see the results of your imaging and laboratory studies on MyChart before your provider has had a chance to review them.  We understand that in some cases there may be results that are confusing or concerning to you. Not all laboratory results come back in the same time frame and the provider may be waiting for multiple results in order to interpret others.  Please give us 48 hours in order for your provider to thoroughly review all the results before contacting the office for clarification of your results.  ° °+++++++++++++++++++++++++ ° Vit D  & °Vit C 1,000 mg   °are recommended to help protect  °against the Covid-19 and other Corona viruses.  ° ° Also it's recommended  °to take  °Zinc 50 mg  °to help  °protect against the Covid-19   °and [facility] place to get ° is also on [employer]  °and don't pay more than 6-8 cents /pill !  °================================ °Coronavirus (COVID-19) Are you at risk? ° °Are you at risk for the Coronavirus (COVID-19)? ° °To be considered HIGH RISK for Coronavirus (COVID-19), you have to meet the following criteria: ° °Traveled to [place], [place], [place], [place] or [place]; or in the [place] to [place], [place], [place]  °or [place]; and have fever, cough, and shortness of breath within the last 2 weeks of travel OR °Been in close contact with a person diagnosed with COVID-19 within the last 2 weeks and have  °fever, cough,and shortness of breath ° °IF YOU DO NOT MEET THESE CRITERIA, YOU ARE CONSIDERED LOW RISK FOR COVID-19. ° °What to do if you are HIGH RISK for COVID-19? ° °If you are having a medical emergency, call 911. °Seek medical care right away. Before you go to a doctor’s office, urgent care or emergency department, ° call ahead and tell them about your recent travel, contact with someone diagnosed with COVID-19  ° and your symptoms.  °You should receive instructions from your physician’s office regarding next  steps of care.  °When you arrive at healthcare provider, tell the healthcare staff immediately you have returned from  °visiting [place], [place], [place], [place] or [place]; or traveled in the [place] to [place], [place],  °[place] or [place] in the last two weeks or you have been in close contact with a person diagnosed with  °COVID-19 in the last 2 weeks.   °Tell the health care staff about your symptoms: fever, cough and shortness of breath. °After you have been seen by a medical provider, you will be either: °Tested for (COVID-19) and discharged home on quarantine except to seek medical care if  °symptoms worsen, and asked to  °Stay home and avoid contact with others until you get your results (4-5 days)  °Avoid travel on public transportation if possible (such as bus, train, or airplane) or °Sent to the Emergency Department by EMS for evaluation, COVID-19 testing  and  °possible admission depending on your condition and test results. ° °What to do if you are LOW RISK for COVID-19? ° °Reduce your risk of any infection by using the same precautions used for avoiding the common cold or flu:  °Wash your hands often with soap and warm water for at least 20 seconds.  If soap and water are not readily available,  °use an alcohol-based hand sanitizer with at least 60% alcohol.  °If coughing or sneezing, cover your mouth and nose by coughing   or sneezing into the elbow areas of your shirt or coat,  into a tissue or into your sleeve (not your hands). Avoid shaking hands with others and consider head nods or verbal greetings only. Avoid touching your eyes, nose, or mouth with unwashed hands.  Avoid close contact with people who are sick. Avoid places or events with large numbers of people in one location, like concerts or sporting events. Carefully consider travel plans you have or are making. If you are planning any travel outside or inside the [place], visit the [facility] webpage for the  latest health notices. If you have some symptoms but not all symptoms, continue to monitor at home and seek medical attention  if your symptoms worsen. If you are having a medical emergency, call 911.   >>>>>>>>>>>>>>>>>>>>>>>>>>>>>>>>>  We Do NOT Approve of [employer] > > > > > > > > > > > > > > > > > > > > > > > > > > > > > > > > > > > > > > >  Preventive Care for Adults  A healthy lifestyle and preventive care can promote health and wellness. Preventive health guidelines for women include the following key practices. A routine yearly physical is a good way to check with your health care provider about your health and preventive screening. It is a chance to share any concerns and updates on your health and to receive a thorough exam. Visit your dentist for a routine exam and preventive care every 6 months. Brush your teeth twice a day and floss once a day. Good oral hygiene prevents tooth decay and gum disease. The frequency of eye exams is based on your age, health, family medical history, use of contact lenses, and other factors. Follow your health care provider's recommendations for frequency of eye exams. Eat a healthy diet. Foods like vegetables, fruits, whole grains, low-fat dairy products, and lean protein foods contain the nutrients you need without too many calories. Decrease your intake of foods high in solid fats, added sugars, and salt. Eat the right amount of calories for you. Get information about a proper diet from your health care provider, if necessary. Regular physical exercise is one of the most important things you can do for your health. Most adults should get at least 150 minutes of moderate-intensity exercise (any activity that increases your heart rate and causes you to sweat) each week. In addition, most adults need muscle-strengthening exercises on 2 or more days a week. Maintain a healthy weight. The body mass index (BMI) is a screening tool to identify  possible weight problems. It provides an estimate of body fat based on height and weight. Your health care provider can find your BMI and can help you achieve or maintain a healthy weight. For adults 20 years and older: A BMI below 18.5 is considered underweight. A BMI of 18.5 to 24.9 is normal. A BMI of 25 to 29.9 is considered overweight. A BMI of 30 and above is considered obese. Maintain normal blood lipids and cholesterol levels by exercising and minimizing your intake of saturated fat. Eat a balanced diet with plenty of fruit and vegetables. If your lipid or cholesterol levels are high, you are over 50, or you are at high risk for heart disease, you may need your cholesterol levels checked more frequently. Ongoing high lipid and cholesterol levels should be treated with medicines if diet and exercise are not working. If you smoke, find out from  your health care provider how to quit. If you do not use tobacco, do not start. °Lung cancer screening is recommended for adults aged 55-80 years who are at high risk for developing lung cancer because of a history of smoking. A yearly low-dose CT scan of the lungs is recommended for people who have at least a 30-pack-year history of smoking and are a current smoker or have quit within the past 15 years. A pack year of smoking is smoking an average of 1 pack of cigarettes a day for 1 year (for example: 1 pack a day for 30 years or 2 packs a day for 15 years). Yearly screening should continue until the smoker has stopped smoking for at least 15 years. Yearly screening should be stopped for people who develop a health problem that would prevent them from having lung cancer treatment. °Avoid use of street drugs. Do not share needles with anyone. Ask for help if you need support or instructions about stopping the use of drugs. °High blood pressure causes heart disease and increases the risk of stroke.  Ongoing high blood pressure should be treated with medicines if  weight loss and exercise do not work. °If you are 55-79 years old, ask your health care provider if you should take aspirin to prevent strokes. °Diabetes screening involves taking a blood sample to check your fasting blood sugar level. This should be done once every 3 years, after age 45, if you are within normal weight and without risk factors for diabetes. Testing should be considered at a younger age or be carried out more frequently if you are overweight and have at least 1 risk factor for diabetes. °Breast cancer screening is essential preventive care for women. You should practice "breast self-awareness." This means understanding the normal appearance and feel of your breasts and may include breast self-examination. Any changes detected, no matter how small, should be reported to a health care provider. Women in their 20s and 30s should have a clinical breast exam (CBE) by a health care provider as part of a regular health exam every 1 to 3 years. After age 40, women should have a CBE every year. Starting at age 40, women should consider having a mammogram (breast X-ray test) every year. Women who have a family history of breast cancer should talk to their health care provider about genetic screening. Women at a high risk of breast cancer should talk to their health care providers about having an MRI and a mammogram every year. °Breast cancer gene (BRCA)-related cancer risk assessment is recommended for women who have family members with BRCA-related cancers. BRCA-related cancers include breast, ovarian, tubal, and peritoneal cancers. Having family members with these cancers may be associated with an increased risk for harmful changes (mutations) in the breast cancer genes BRCA1 and BRCA2. Results of the assessment will determine the need for genetic counseling and BRCA1 and BRCA2 testing. °Routine pelvic exams to screen for cancer are no longer recommended for nonpregnant women who are considered low risk for  cancer of the pelvic organs (ovaries, uterus, and vagina) and who do not have symptoms. Ask your health care provider if a screening pelvic exam is right for you. °If you have had past treatment for cervical cancer or a condition that could lead to cancer, you need Pap tests and screening for cancer for at least 20 years after your treatment. If Pap tests have been discontinued, your risk factors (such as having a new sexual partner) need to be   reassessed to determine if screening should be resumed. Some women have medical problems that increase the chance of getting cervical cancer. In these cases, your health care provider may recommend more frequent screening and Pap tests.  Colorectal cancer can be detected and often prevented. Most routine colorectal cancer screening begins at the age of 64 years and continues through age 10 years. However, your health care provider may recommend screening at an earlier age if you have risk factors for colon cancer. On a yearly basis, your health care provider may provide home test kits to check for hidden blood in the stool. Use of a small camera at the end of a tube, to directly examine the colon (sigmoidoscopy or colonoscopy), can detect the earliest forms of colorectal cancer. Talk to your health care provider about this at age 78, when routine screening begins.  Direct exam of the colon should be repeated every 5-10 years through age 28 years, unless early forms of pre-cancerous polyps or small growths are found. Osteoporosis is a disease in which the bones lose minerals and strength with aging. This can result in serious bone fractures or breaks. The risk of osteoporosis can be identified using a bone density scan. Women ages 57 years and over and women at risk for fractures or osteoporosis should discuss screening with their health care providers. Ask your health care provider whether you should take a calcium supplement or vitamin D to reduce the rate of  osteoporosis. Menopause can be associated with physical symptoms and risks. Hormone replacement therapy is available to decrease symptoms and risks. You should talk to your health care provider about whether hormone replacement therapy is right for you. Use sunscreen. Apply sunscreen liberally and repeatedly throughout the day. You should seek shade when your shadow is shorter than you. Protect yourself by wearing long sleeves, pants, a wide-brimmed hat, and sunglasses year round, whenever you are outdoors. Once a month, do a whole body skin exam, using a mirror to look at the skin on your back. Tell your health care provider of new moles, moles that have irregular borders, moles that are larger than a pencil eraser, or moles that have changed in shape or color. Stay current with required vaccines (immunizations). Influenza vaccine. All adults should be immunized every year. Tetanus, diphtheria, and acellular pertussis (Td, Tdap) vaccine. Pregnant women should receive 1 dose of Tdap vaccine during each pregnancy. The dose should be obtained regardless of the length of time since the last dose. Immunization is preferred during the 27th-36th week of gestation. An adult who has not previously received Tdap or who does not know her vaccine status should receive 1 dose of Tdap. This initial dose should be followed by tetanus and diphtheria toxoids (Td) booster doses every 10 years. Adults with an unknown or incomplete history of completing a 3-dose immunization series with Td-containing vaccines should begin or complete a primary immunization series including a Tdap dose. Adults should receive a Td booster every 10 years.  Zoster vaccine. One dose is recommended for adults aged 29 years or older unless certain conditions are present.  Pneumococcal 13-valent conjugate (PCV13) vaccine. When indicated, a person who is uncertain of her immunization history and has no record of immunization should receive the PCV13  vaccine. An adult aged 43 years or older who has certain medical conditions and has not been previously immunized should receive 1 dose of PCV13 vaccine. This PCV13 should be followed with a dose of pneumococcal polysaccharide (PPSV23) vaccine. The PPSV23  vaccine dose should be obtained at least 1 or more year(s) after the dose of PCV13 vaccine. An adult aged 19 years or older who has certain medical conditions and previously received 1 or more doses of PPSV23 vaccine should receive 1 dose of PCV13. The PCV13 vaccine dose should be obtained 1 or more years after the last PPSV23 vaccine dose. ° °Pneumococcal polysaccharide (PPSV23) vaccine. When PCV13 is also indicated, PCV13 should be obtained first. All adults aged 65 years and older should be immunized. An adult younger than age 65 years who has certain medical conditions should be immunized. Any person who resides in a nursing home or long-term care facility should be immunized. An adult smoker should be immunized. People with an immunocompromised condition and certain other conditions should receive both PCV13 and PPSV23 vaccines. People with human immunodeficiency virus (HIV) infection should be immunized as soon as possible after diagnosis. Immunization during chemotherapy or radiation therapy should be avoided. Routine use of PPSV23 vaccine is not recommended for American Indians, [place] Natives, or people younger than 65 years unless there are medical conditions that require PPSV23 vaccine. When indicated, people who have unknown immunization and have no record of immunization should receive PPSV23 vaccine. One-time revaccination 5 years after the first dose of PPSV23 is recommended for people aged 19-64 years who have chronic kidney failure, nephrotic syndrome, asplenia, or immunocompromised conditions. People who received 1-2 doses of PPSV23 before age 65 years should receive another dose of PPSV23 vaccine at age 65 years or later if at least 5 years have  passed since the previous dose. Doses of PPSV23 are not needed for people immunized with PPSV23 at or after age 65 years. ° °Preventive Services / Frequency ° °Ages 65 years and over °Blood pressure check. °Lipid and cholesterol check. °Lung cancer screening. / Every year if you are aged 55-80 years and have a 30-pack-year history of smoking and currently smoke or have quit within the past 15 years. Yearly screening is stopped once you have quit smoking for at least 15 years or develop a health problem that would prevent you from having lung cancer treatment. °Clinical breast exam.** / Every year after age 40 years. ° BRCA-related cancer risk assessment.** / For women who have family members with a BRCA-related cancer (breast, ovarian, tubal, or peritoneal cancers). °Mammogram.** / Every year beginning at age 40 years and continuing for as long as you are in good health. Consult with your health care provider. °Pap test.** / Every 3 years starting at age 30 years through age 65 or 70 years with 3 consecutive normal Pap tests. Testing can be stopped between 65 and 70 years with 3 consecutive normal Pap tests and no abnormal Pap or HPV tests in the past 10 years. °Fecal occult blood test (FOBT) of stool. / Every year beginning at age 50 years and continuing until age 75 years. You may not need to do this test if you get a colonoscopy every 10 years. °Flexible sigmoidoscopy or colonoscopy.** / Every 5 years for a flexible sigmoidoscopy or every 10 years for a colonoscopy beginning at age 50 years and continuing until age 75 years. °Hepatitis C blood test.** / For all people born from 1945 through 1965 and any individual with known risks for hepatitis C. °Osteoporosis screening.** / A one-time screening for women ages 65 years and over and women at risk for fractures or osteoporosis. °Skin self-exam. / Monthly. °Influenza vaccine. / Every year. °Tetanus, diphtheria, and acellular pertussis (Tdap/Td) vaccine.** /   1 dose  of Td every 10 years. °Zoster vaccine.** / 1 dose for adults aged 60 years or older. °Pneumococcal 13-valent conjugate (PCV13) vaccine.** / Consult your health care provider. °Pneumococcal polysaccharide (PPSV23) vaccine.** / 1 dose for all adults aged 65 years and older. °Screening for abdominal aortic aneurysm (AAA)  by ultrasound is recommended for people who have history of high blood pressure or who are current or former smokers. °++++++++++++++++++++ °Recommend Adult Low Dose Aspirin or  °coated  Aspirin 81 mg daily  °To reduce risk of Colon Cancer 40 %, ° Skin Cancer 26 % ,  °Melanoma 46% ° and  °Pancreatic cancer 60% °++++++++++++++++++++ °Vitamin D goal ° is between 70-100.  °Please make sure that you are taking your Vitamin D as directed.  °It is very important as a natural anti-inflammatory  °helping hair, skin, and nails, as well as reducing stroke and heart attack risk.  °It helps your bones and helps with mood. °It also decreases numerous cancer risks so please take it as directed.  °Low Vit D is associated with a 200-300% higher risk for CANCER  °and 200-300% higher risk for HEART   ATTACK  &  STROKE.   °...................................... °It is also associated with higher death rate at younger ages,  °autoimmune diseases like Rheumatoid arthritis, Lupus, Multiple Sclerosis.    °Also many other serious conditions, like depression, Alzheimer's °Dementia, infertility, muscle aches, fatigue, fibromyalgia - just to name a few. °++++++++++++++++++ °Recommend the book "The END of DIETING" by Dr Joel Fuhrman  °& the book "The END of DIABETES " by Dr Joel Fuhrman °At [employer] - get book & Audio CD's  °  Being diabetic has a  300% increased risk for heart attack, stroke, cancer, and alzheimer- type vascular dementia. It is very important that you work harder with diet by avoiding all foods that are white. Avoid white rice (brown & wild rice is OK), white potatoes (sweetpotatoes in moderation is OK),  White bread or wheat bread or anything made out of white flour like bagels, donuts, rolls, buns, biscuits, cakes, pastries, cookies, pizza crust, and pasta (made from white flour & egg whites) - vegetarian pasta or spinach or wheat pasta is OK. Multigrain breads like Arnold's or [employer], or multigrain sandwich thins or flatbreads.  Diet, exercise and weight loss can reverse and cure diabetes in the early stages.  Diet, exercise and weight loss is very important in the control and prevention of complications of diabetes which affects every system in your body, ie. Brain - dementia/stroke, eyes - glaucoma/blindness, heart - heart attack/heart failure, kidneys - dialysis, stomach - gastric paralysis, intestines - malabsorption, nerves - severe painful neuritis, circulation - gangrene & loss of a leg(s), and finally cancer and Alzheimers. ° °  I recommend avoid fried & greasy foods,  sweets/candy, white rice (brown or wild rice or Quinoa is OK), white potatoes (sweet potatoes are OK) - anything made from white flour - bagels, doughnuts, rolls, buns, biscuits,white and wheat breads, pizza crust and traditional pasta made of white flour & egg white(vegetarian pasta or spinach or wheat pasta is OK).  Multi-grain bread is OK - like multi-grain flat bread or sandwich thins. Avoid alcohol in excess. Exercise is also important. ° °  Eat all the vegetables you want - avoid meat, especially red meat and dairy - especially cheese.  Cheese is the most concentrated form of trans-fats which is the worst thing to clog up our arteries. Veggie cheese is OK   which can be found in the fresh produce section at [employer] or [employer] or [employer] ° °+++++++++++++++++++ °DASH Eating Plan ° °DASH stands for "Dietary Approaches to Stop Hypertension."  ° °The DASH eating plan is a healthy eating plan that has been shown to reduce high blood pressure (hypertension). Additional health benefits may include reducing the risk of type 2  diabetes mellitus, heart disease, and stroke. The DASH eating plan may also help with weight loss. °WHAT DO I NEED TO KNOW ABOUT THE DASH EATING PLAN? °For the DASH eating plan, you will follow these general guidelines: °Choose foods with a percent daily value for sodium of less than 5% (as listed on the food label). °Use salt-free seasonings or herbs instead of table salt or sea salt. °Check with your health care provider or pharmacist before using salt substitutes. °Eat lower-sodium products, often labeled as "lower sodium" or "no salt added." °Eat fresh foods. °Eat more vegetables, fruits, and low-fat dairy products. °Choose whole grains. Look for the word "whole" as the first word in the ingredient list. °Choose fish  °Limit sweets, desserts, sugars, and sugary drinks. °Choose heart-healthy fats. °Eat veggie cheese  °Eat more home-cooked food and less restaurant, buffet, and fast food. °Limit fried foods. °Cook foods using methods other than frying. °Limit canned vegetables. If you do use them, rinse them well to decrease the sodium. °When eating at a restaurant, ask that your food be prepared with less salt, or no salt if possible. °                  °   WHAT FOODS CAN I EAT? °Read Dr Joel Fuhrman's books on The End of Dieting & The End of Diabetes ° °Grains °Whole grain or whole wheat bread. Brown rice. Whole grain or whole wheat pasta. Quinoa, bulgur, and whole grain cereals. Low-sodium cereals. Corn or whole wheat flour tortillas. Whole grain cornbread. Whole grain crackers. Low-sodium crackers. ° °Vegetables °Fresh or frozen vegetables (raw, steamed, roasted, or grilled). Low-sodium or reduced-sodium tomato and vegetable juices. Low-sodium or reduced-sodium tomato sauce and paste. Low-sodium or reduced-sodium canned vegetables.  ° °Fruits °All fresh, canned (in natural juice), or frozen fruits. ° °Protein Products ° All fish and seafood.  Dried beans, peas, or lentils. Unsalted nuts and seeds. Unsalted  canned beans. ° °Dairy °Low-fat dairy products, such as skim or 1% milk, 2% or reduced-fat cheeses, low-fat ricotta or cottage cheese, or plain low-fat yogurt. Low-sodium or reduced-sodium cheeses. ° °Fats and Oils °Tub margarines without trans fats. Light or reduced-fat mayonnaise and salad dressings (reduced sodium). Avocado. Safflower, olive, or canola oils. Natural peanut or almond butter. ° °Other °Unsalted popcorn and pretzels. °The items listed above may not be a complete list of recommended foods or beverages. Contact your dietitian for more options. ° °+++++++++++++++ ° °WHAT FOODS ARE NOT RECOMMENDED? °Grains/ White flour or wheat flour °White bread. White pasta. White rice. Refined cornbread. Bagels and croissants. Crackers that contain trans fat. ° °Vegetables ° °Creamed or fried vegetables. Vegetables in a . Regular canned vegetables. Regular canned tomato sauce and paste. Regular tomato and vegetable juices. ° °Fruits °Dried fruits. Canned fruit in light or heavy syrup. Fruit juice. ° °Meat and Other Protein Products °Meat in general - RED meat & White meat.  Fatty cuts of meat. Ribs, chicken wings, all processed meats as bacon, sausage, bologna, salami, fatback, hot dogs, bratwurst and packaged luncheon meats. ° °Dairy °Whole or 2% milk, cream, half-and-half, and cream cheese.   Whole-fat or sweetened yogurt. Full-fat cheeses or blue cheese. Non-dairy creamers and whipped toppings. Processed cheese, cheese spreads, or cheese curds. ° °Condiments °Onion and garlic salt, seasoned salt, table salt, and sea salt. Canned and packaged gravies. Worcestershire sauce. Tartar sauce. Barbecue sauce. Teriyaki sauce. Soy sauce, including reduced sodium. Steak sauce. Fish sauce. Oyster sauce. Cocktail sauce. Horseradish. Ketchup and mustard. Meat flavorings and tenderizers. Bouillon cubes. Hot sauce. Tabasco sauce. Marinades. Taco seasonings. Relishes. ° °Fats and Oils °Butter, stick margarine, lard, shortening and  bacon fat. Coconut, palm kernel, or palm oils. Regular salad dressings. ° °Pickles and olives. Salted popcorn and pretzels. ° °The items listed above may not be a complete list of foods and beverages to avoid. ° ° °

## 2021-08-12 LAB — CBC WITH DIFFERENTIAL/PLATELET
Absolute Monocytes: 581 cells/uL (ref 200–950)
Basophils Absolute: 58 cells/uL (ref 0–200)
Basophils Relative: 0.7 %
Eosinophils Absolute: 183 cells/uL (ref 15–500)
Eosinophils Relative: 2.2 %
HCT: 46.7 % — ABNORMAL HIGH (ref 35.0–45.0)
Hemoglobin: 14.7 g/dL (ref 11.7–15.5)
Lymphs Abs: 2623 cells/uL (ref 850–3900)
MCH: 28.2 pg (ref 27.0–33.0)
MCHC: 31.5 g/dL — ABNORMAL LOW (ref 32.0–36.0)
MCV: 89.6 fL (ref 80.0–100.0)
MPV: 11.6 fL (ref 7.5–12.5)
Monocytes Relative: 7 %
Neutro Abs: 4856 cells/uL (ref 1500–7800)
Neutrophils Relative %: 58.5 %
Platelets: 221 10*3/uL (ref 140–400)
RBC: 5.21 10*6/uL — ABNORMAL HIGH (ref 3.80–5.10)
RDW: 12.5 % (ref 11.0–15.0)
Total Lymphocyte: 31.6 %
WBC: 8.3 10*3/uL (ref 3.8–10.8)

## 2021-08-12 LAB — LIPID PANEL
Cholesterol: 131 mg/dL (ref <200)
HDL: 62 mg/dL (ref >=50)
LDL Cholesterol (Calc): 50 mg/dL (calc)
Non-HDL Cholesterol (Calc): 69 mg/dL (calc) (ref <130)
Total CHOL/HDL Ratio: 2.1 (calc) (ref <5.0)
Triglycerides: 104 mg/dL (ref <150)

## 2021-08-12 LAB — URINALYSIS, ROUTINE W REFLEX MICROSCOPIC
Bilirubin Urine: NEGATIVE
Glucose, UA: NEGATIVE
Hgb urine dipstick: NEGATIVE
Ketones, ur: NEGATIVE
Leukocytes,Ua: NEGATIVE
Nitrite: NEGATIVE
Protein, ur: NEGATIVE
Specific Gravity, Urine: 1.019 (ref 1.001–1.035)
pH: 7.5 (ref 5.0–8.0)

## 2021-08-12 LAB — COMPLETE METABOLIC PANEL WITH GFR
AG Ratio: 2 (calc) (ref 1.0–2.5)
ALT: 17 U/L (ref 6–29)
AST: 14 U/L (ref 10–35)
Albumin: 4.3 g/dL (ref 3.6–5.1)
Alkaline phosphatase (APISO): 58 U/L (ref 37–153)
BUN/Creatinine Ratio: 27 (calc) — ABNORMAL HIGH (ref 6–22)
BUN: 27 mg/dL — ABNORMAL HIGH (ref 7–25)
CO2: 28 mmol/L (ref 20–32)
Calcium: 9.6 mg/dL (ref 8.6–10.4)
Chloride: 107 mmol/L (ref 98–110)
Creat: 0.99 mg/dL (ref 0.50–1.05)
Globulin: 2.1 g/dL (calc) (ref 1.9–3.7)
Glucose, Bld: 83 mg/dL (ref 65–99)
Potassium: 4.4 mmol/L (ref 3.5–5.3)
Sodium: 143 mmol/L (ref 135–146)
Total Bilirubin: 0.3 mg/dL (ref 0.2–1.2)
Total Protein: 6.4 g/dL (ref 6.1–8.1)
eGFR: 62 mL/min/{1.73_m2} (ref >=60)

## 2021-08-12 LAB — MICROALBUMIN / CREATININE URINE RATIO
Creatinine, Urine: 65 mg/dL (ref 20–275)
Microalb Creat Ratio: 3 mcg/mg creat (ref <30)
Microalb, Ur: 0.2 mg/dL

## 2021-08-12 LAB — HEMOGLOBIN A1C
Hgb A1c MFr Bld: 5.9 % of total Hgb — ABNORMAL HIGH (ref <5.7)
Mean Plasma Glucose: 123 mg/dL
eAG (mmol/L): 6.8 mmol/L

## 2021-08-12 LAB — MAGNESIUM: Magnesium: 2.1 mg/dL (ref 1.5–2.5)

## 2021-08-12 LAB — TSH: TSH: 2.17 mIU/L (ref 0.40–4.50)

## 2021-08-12 LAB — VITAMIN D 25 HYDROXY (VIT D DEFICIENCY, FRACTURES): Vit D, 25-Hydroxy: 74 ng/mL (ref 30–100)

## 2021-08-12 LAB — INSULIN, RANDOM: Insulin: 24.7 u[IU]/mL — ABNORMAL HIGH

## 2021-08-12 NOTE — Progress Notes (Signed)
============================================================ °-   Test results slightly outside the reference range are not unusual. If there is anything important, I will review this with you,  otherwise it is considered normal test values.  If you have further questions,  please do not hesitate to contact me at the office or via My Chart.  ============================================================ ============================================================  -  A1c = 5.9%  - slightly elevated ,   So    - Avoid Sweets, Candy & White Stuff   - White Rice, White Potatoes, White Flour  - Breads &  Pasta ============================================================ ============================================================  -  Total Chol =  131    and LDL = 50   - Both  Excellent   - Very low risk for Heart Attack  / Stroke ============================================================ ============================================================  -  Vitamin D = 74 - Excellent  ! -   Please keep dose same  ============================================================ ============================================================  -  All Else - CBC - Kidneys - Electrolytes - Liver - Magnesium & Thyroid    - all  Normal / OK ============================================================ ============================================================  - Keep up the [place] Work  !  ============================================================ ============================================================

## 2021-11-12 ENCOUNTER — Telehealth: Payer: Self-pay

## 2021-11-12 ENCOUNTER — Other Ambulatory Visit: Payer: Self-pay | Admitting: Adult Health

## 2021-11-12 DIAGNOSIS — G4700 Insomnia, unspecified: Secondary | ICD-10-CM

## 2021-11-12 MED ORDER — ZOLPIDEM TARTRATE 5 MG PO TABS: ORAL_TABLET | ORAL | 2 refills | Status: DC

## 2021-11-12 NOTE — Progress Notes (Signed)
Future Appointments  ?Date Time Provider Department Center  ?11/25/2021  3:30 PM Judd Gaudier, NP [facility] None  ?03/11/2022  2:30 PM Mull, Loma Sousa, NP [facility] None  ?08/16/2022  3:00 PM [name], MD [facility] None  ? ? ?PDMP reviewed for ambien refill request.  ? ?

## 2021-11-12 NOTE — Telephone Encounter (Signed)
Ambien refill request.

## 2021-11-19 ENCOUNTER — Other Ambulatory Visit: Payer: Self-pay | Admitting: Adult Health

## 2021-11-25 ENCOUNTER — Encounter: Payer: Self-pay | Admitting: Adult Health

## 2021-11-25 ENCOUNTER — Ambulatory Visit (INDEPENDENT_AMBULATORY_CARE_PROVIDER_SITE_OTHER): Payer: Medicare Other | Admitting: Adult Health

## 2021-11-25 VITALS — BP 120/82 | HR 81 | Temp 97.7°F | Wt 235.0 lb

## 2021-11-25 DIAGNOSIS — Z820 Family history of epilepsy and other diseases of the nervous system: Secondary | ICD-10-CM

## 2021-11-25 DIAGNOSIS — F3341 Major depressive disorder, recurrent, in partial remission: Secondary | ICD-10-CM

## 2021-11-25 DIAGNOSIS — E559 Vitamin D deficiency, unspecified: Secondary | ICD-10-CM

## 2021-11-25 DIAGNOSIS — I10 Essential (primary) hypertension: Secondary | ICD-10-CM | POA: Diagnosis not present

## 2021-11-25 DIAGNOSIS — F419 Anxiety disorder, unspecified: Secondary | ICD-10-CM

## 2021-11-25 DIAGNOSIS — E7800 Pure hypercholesterolemia, unspecified: Secondary | ICD-10-CM | POA: Diagnosis not present

## 2021-11-25 DIAGNOSIS — Z79899 Other long term (current) drug therapy: Secondary | ICD-10-CM

## 2021-11-25 DIAGNOSIS — I25118 Atherosclerotic heart disease of native coronary artery with other forms of angina pectoris: Secondary | ICD-10-CM | POA: Diagnosis not present

## 2021-11-25 DIAGNOSIS — R7309 Other abnormal glucose: Secondary | ICD-10-CM

## 2021-11-25 NOTE — Progress Notes (Addendum)
?FOLLOW UP ? ?Assessment and Plan:  ? ?CAD with other angina (HCC) ?Dr. Royann Shivers follows; has nitroglycerine PRN, recently impred ?Control blood pressure, cholesterol (LDL <70), glucose, increase exercise.  ? ?Hypertension ?Well controlled with current medications  ?Monitor blood pressure at home; patient to call if consistently greater than 130/80 ?Continue DASH diet.   ?Reminder to go to the ER if any CP, SOB, nausea, dizziness, severe HA, changes vision/speech, left arm numbness and tingling and jaw pain. ? ?Cholesterol ?Currently at goal; rosuvastatin 20 mg every other day, zetia 10 mg due to myalgias and needing aggressive control ?Continue low cholesterol diet and exercise.  ?Check lipid panel.  ? ?Other abnormal glucose ?Recent A1Cs at goal ?Discussed diet/exercise, weight management  ?Defer A1C; check CMP ? ?Morbid obesity - BMI 35+ with htn, hld, abnormal glucose ?Long discussion about weight loss, diet, and exercise ?Recommended diet heavy in fruits and veggies and low in animal meats, cheeses, and dairy products, appropriate calorie intake ?Discussed ideal weight for height and initial weight goal (<200lb) ?Start back on weight watchers, has been very successful with this in the past, sister as accountability partner, set goal to start at 12/18/2021 meeting ?Discussed muscle = metabolism, high protein diet, appropriate calorie intake ?Protein + fiber + healthy fat, stop when full  ?Will follow up in 3 months ? ?Vitamin D Def ?At goal at last visit; continue supplementation to maintain goal of 70-100 ? ?Depression/anxiety ?Continue wellbutrin 150 mg daily; rare valium use ?Lifestyle discussed: diet/exerise, sleep hygiene, stress management, hydration ? ?Family hx of spinocerebellar ataxia type 6 (SCA6) ?- referral placed for genetic testing  ? ? ?Orders Placed This Encounter  ?Procedures  ? CBC with Differential/Platelet  ? COMPLETE METABOLIC PANEL WITH GFR  ? Magnesium  ? Lipid panel  ? TSH  ? Ambulatory  referral to Genetics  ? ? ?Continue diet and meds as discussed. Further disposition pending results of labs. Discussed med's effects and SE's.   ?Over 30 minutes of exam, counseling, chart review, and critical decision making was performed.  ? ?Future Appointments  ?Date Time Provider Department Center  ?03/11/2022  2:30 PM Mull, Loma Sousa, NP [facility] None  ?08/16/2022  3:00 PM [name], MD [facility] None  ? ? ?---------------------------------------------------------------------------------------------------------------------- ? ?HPI ?71 y.o. female  presents for 3 month follow up on hypertension, cholesterol, glucose management, weight and vitamin D deficiency.  ? ?Mother and 2 maternal aunts were diagnosed with spinocerebellar ataxia type 6 (SCA6) and all passed recently, she reports sx started around age 38 for her mother and she passed at age 39. Requesting referral for genetic testing today.  ? 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 She has taken ambien 5 mg nightly for sleep and has done well for many years. Very rare valvium use.  ? ?she has a diagnosis of GERD which is currently managed by protonix 40 mg daily (failed transition to famotidine). Well controlled.  ? ?BMI is Body mass index is 39.11 kg/m?., she has been working on diet and exercise, back on weight watchers and doing well, but got off of it, has regained 6 lb.  ?Discussed restarting weight watchers with sister as accountability partner now that she is retired, plan to start going to meetings on Friday (plan for first visit 12/18/2021).  ?Discussed high protein diet, resistance exercises for muscle mass.  ?Also has treadmill could use, discussed benefits of water aerobics for ortho concerns.  ?Wt Readings from Last  3 Encounters:  ?11/25/21 235 lb (106.6 kg)  ?08/11/21 229 lb (103.9 kg)  ?04/01/21 231 lb 12.8 oz (105.1 kg)  ? ?October 2020 she underwent a coronary CT angiogram.  She has a very  high calcium score of 598 (96th percentile).  She had several moderate stenoses including 50% in the left main, 50-69% in the proximal LAD, 40-59% in the left circumflex. Cardiac catheterization performed in October 2020 showed "minimal luminal irregularities, no obstructive CAD". She has had intermittent angina episodes associated with stress/anxiety that respond to nitroglycerine. Recently improved. Dr. Royann Shivers follows.  ? ?Her blood pressure has been controlled at home, today their BP is BP: 120/82 ? She does not workout. She denies chest pain, shortness of breath, dizziness. ? ? She is on cholesterol medication, taking Rosuvastatin 20 mg every other day (had myalgias with daily) and zetia 10 mg daily and denies myalgias. Her cholesterol is at goal. The cholesterol last visit was:   ?Lab Results  ?Component Value Date  ? CHOL 126 11/25/2021  ? HDL 63 11/25/2021  ? LDLCALC 45 11/25/2021  ? TRIG 98 11/25/2021  ? CHOLHDL 2.0 11/25/2021  ? ? She has been working on diet and exercise for glucose management, and denies increased appetite, nausea, paresthesia of the feet, polydipsia, polyuria and visual disturbances. Last A1C in the office was:  ?Lab Results  ?Component Value Date  ? HGBA1C 5.9 (H) 08/11/2021  ? ?Patient is on Vitamin D supplement.   ?Lab Results  ?Component Value Date  ? VD25OH 74 08/11/2021  ?   ? ? ?Current Medications:  ?Current Outpatient Medications on File Prior to Visit  ?Medication Sig  ? aspirin 81 MG tablet Take 81 mg by mouth daily.  ? buPROPion (WELLBUTRIN XL) 150 MG 24 hr tablet Take 1 tablet (150 mg total) by mouth every morning.  ? calcipotriene (DOVONOX) 0.005 % ointment Apply 1 application topically as needed (Hands).  ? cetirizine (ZYRTEC) 10 MG tablet Take 10 mg by mouth daily.  ? Cholecalciferol (VITAMIN D3) 5000 UNITS CAPS Take 5,000 Units by mouth 4 (four) times a week. Takes M,W,TH,F  ? diazepam (VALIUM) 10 MG tablet Take 1/2-1 tablet at Bedtime ONLY if needed for Sleep &  limit  to 5 days /week to avoid addiction (Patient taking differently: Take 5-10 mg by mouth daily as needed for anxiety or sleep.)  ? ezetimibe (ZETIA) 10 MG tablet TAKE ONE TABLET BY MOUTH DAILY  ? gabapentin (NEURONTIN) 100 MG capsule Take 1 to 3 capsules  1 to 2 hours  before Bedtime  as needed for  Sleep  ? Magnesium 250 MG TABS Take 250 mg by mouth daily.  ? Multiple Vitamins-Minerals (MULTIVITAMIN WITH MINERALS) tablet Take 1 tablet by mouth daily.  ? naproxen sodium (ALEVE) 220 MG tablet Take 440 mg by mouth as needed (Knees).  ? nitroGLYCERIN (NITROSTAT) 0.4 MG SL tablet Place 1 tablet (0.4 mg total) under the tongue every 5 (five) minutes as needed for chest pain.  ? pantoprazole (PROTONIX) 40 MG tablet TAKE 1 TABLET DAILY TO PREVENT INDIGESTION & HEARTBURN  ? rosuvastatin (CRESTOR) 20 MG tablet Take 1 tab Sun Tues Thurs Sat for cholesterol.  ? triamterene-hydrochlorothiazide (MAXZIDE-25) 37.5-25 MG tablet TAKE 1 TABLET BY MOUTH EVERYDAY AT BEDTIME  ? zolpidem (AMBIEN) 5 MG tablet TAKE ONE TABLET BY MOUTH DAILY 1 HOUR BEFORE BEDTIME  ? cephALEXin (KEFLEX) 500 MG capsule Take 500 mg by mouth as needed. (Patient not taking: Reported on 08/11/2021)  ? ?No  current facility-administered medications on file prior to visit.  ? ? ? ?Allergies:  ?Allergies  ?Allergen Reactions  ? Doxycycline Other (See Comments)  ?  Jittery and causes nausea  ? Levaquin [Levofloxacin In D5w]   ?  yeast  ? Lexapro [Escitalopram Oxalate]   ?  Had wheezing and palpitations  ? Oruvail [Ketoprofen]   ?  GI upset  ?  ? ?Medical History:  ?Past Medical History:  ?Diagnosis Date  ? ANAL FISSURE, HX OF 11/06/2007  ? Qualifier: Diagnosis of  By: Misty StanleyLewellyn CMA Duncan Dull(AAMA), Marchelle FolksAmanda    ? Anxiety   ? Depression   ? Difficulty sleeping   ? takes Ambien  ? Diverticulosis 5/1/5  ? DJD (degenerative joint disease)   ? Family history of anesthesia complication   ? "mother quit breathing" 30 yrs ago pt does not know any more details. Pt has never had any problems  with anesthesia herself  ? GERD (gastroesophageal reflux disease)   ? Hyperlipidemia   ? Hypertension   ? Obese   ? Peripheral edema   ? Psoriasis   ? HANDS  ? Psoriasis   ? 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 Un

## 2021-11-26 LAB — COMPLETE METABOLIC PANEL WITH GFR
AG Ratio: 2.1 (calc) (ref 1.0–2.5)
ALT: 21 U/L (ref 6–29)
AST: 14 U/L (ref 10–35)
Albumin: 4.1 g/dL (ref 3.6–5.1)
Alkaline phosphatase (APISO): 61 U/L (ref 37–153)
BUN/Creatinine Ratio: 28 (calc) — ABNORMAL HIGH (ref 6–22)
BUN: 28 mg/dL — ABNORMAL HIGH (ref 7–25)
CO2: 26 mmol/L (ref 20–32)
Calcium: 9.3 mg/dL (ref 8.6–10.4)
Chloride: 105 mmol/L (ref 98–110)
Creat: 1.01 mg/dL (ref 0.50–1.05)
Globulin: 2 g/dL (calc) (ref 1.9–3.7)
Glucose, Bld: 82 mg/dL (ref 65–99)
Potassium: 4.4 mmol/L (ref 3.5–5.3)
Sodium: 141 mmol/L (ref 135–146)
Total Bilirubin: 0.3 mg/dL (ref 0.2–1.2)
Total Protein: 6.1 g/dL (ref 6.1–8.1)
eGFR: 60 mL/min/{1.73_m2} (ref >=60)

## 2021-11-26 LAB — CBC WITH DIFFERENTIAL/PLATELET
Absolute Monocytes: 576 cells/uL (ref 200–950)
Basophils Absolute: 60 cells/uL (ref 0–200)
Basophils Relative: 0.7 %
Eosinophils Absolute: 224 cells/uL (ref 15–500)
Eosinophils Relative: 2.6 %
HCT: 45.1 % — ABNORMAL HIGH (ref 35.0–45.0)
Hemoglobin: 14.6 g/dL (ref 11.7–15.5)
Lymphs Abs: 2606 cells/uL (ref 850–3900)
MCH: 28.6 pg (ref 27.0–33.0)
MCHC: 32.4 g/dL (ref 32.0–36.0)
MCV: 88.4 fL (ref 80.0–100.0)
MPV: 11.5 fL (ref 7.5–12.5)
Monocytes Relative: 6.7 %
Neutro Abs: 5134 cells/uL (ref 1500–7800)
Neutrophils Relative %: 59.7 %
Platelets: 209 10*3/uL (ref 140–400)
RBC: 5.1 10*6/uL (ref 3.80–5.10)
RDW: 12.5 % (ref 11.0–15.0)
Total Lymphocyte: 30.3 %
WBC: 8.6 10*3/uL (ref 3.8–10.8)

## 2021-11-26 LAB — LIPID PANEL
Cholesterol: 126 mg/dL (ref <200)
HDL: 63 mg/dL (ref >=50)
LDL Cholesterol (Calc): 45 mg/dL (calc)
Non-HDL Cholesterol (Calc): 63 mg/dL (calc) (ref <130)
Total CHOL/HDL Ratio: 2 (calc) (ref <5.0)
Triglycerides: 98 mg/dL (ref <150)

## 2021-11-26 LAB — MAGNESIUM: Magnesium: 2 mg/dL (ref 1.5–2.5)

## 2021-11-26 LAB — TSH: TSH: 2.46 mIU/L (ref 0.40–4.50)

## 2021-12-18 DIAGNOSIS — M1712 Unilateral primary osteoarthritis, left knee: Secondary | ICD-10-CM | POA: Diagnosis not present

## 2021-12-18 DIAGNOSIS — M25521 Pain in right elbow: Secondary | ICD-10-CM | POA: Diagnosis not present

## 2021-12-25 DIAGNOSIS — M1712 Unilateral primary osteoarthritis, left knee: Secondary | ICD-10-CM | POA: Diagnosis not present

## 2022-01-01 DIAGNOSIS — M1712 Unilateral primary osteoarthritis, left knee: Secondary | ICD-10-CM | POA: Diagnosis not present

## 2022-01-13 ENCOUNTER — Other Ambulatory Visit: Payer: Self-pay | Admitting: Internal Medicine

## 2022-01-13 DIAGNOSIS — Z1231 Encounter for screening mammogram for malignant neoplasm of breast: Secondary | ICD-10-CM

## 2022-01-15 ENCOUNTER — Other Ambulatory Visit: Payer: Self-pay | Admitting: Cardiovascular Disease

## 2022-02-01 DIAGNOSIS — M25521 Pain in right elbow: Secondary | ICD-10-CM | POA: Diagnosis not present

## 2022-02-04 ENCOUNTER — Ambulatory Visit
Admission: RE | Admit: 2022-02-04 | Discharge: 2022-02-04 | Disposition: A | Discharge status: Home / Self Care | Payer: Medicare Other | Source: Ambulatory Visit | Attending: Internal Medicine | Admitting: Internal Medicine

## 2022-02-04 ENCOUNTER — Other Ambulatory Visit: Payer: Self-pay | Admitting: Adult Health

## 2022-02-04 DIAGNOSIS — E782 Mixed hyperlipidemia: Secondary | ICD-10-CM

## 2022-02-04 DIAGNOSIS — Z1231 Encounter for screening mammogram for malignant neoplasm of breast: Secondary | ICD-10-CM

## 2022-02-09 ENCOUNTER — Other Ambulatory Visit: Payer: Self-pay | Admitting: Cardiovascular Disease

## 2022-02-22 ENCOUNTER — Other Ambulatory Visit: Payer: Self-pay | Admitting: Cardiovascular Disease

## 2022-03-02 ENCOUNTER — Other Ambulatory Visit: Payer: Self-pay | Admitting: Cardiovascular Disease

## 2022-03-05 ENCOUNTER — Other Ambulatory Visit: Payer: Self-pay | Admitting: Nurse Practitioner

## 2022-03-05 DIAGNOSIS — G4700 Insomnia, unspecified: Secondary | ICD-10-CM

## 2022-03-05 MED ORDER — ZOLPIDEM TARTRATE 5 MG PO TABS: ORAL_TABLET | ORAL | 0 refills | Status: DC

## 2022-03-10 NOTE — Progress Notes (Unsigned)
MEDICARE ANNUAL WELLNESS VISIT AND FOLLOW UP  Assessment:   Annual Medicare Wellness Visit Due annually  Health maintenance reviewed  CAD with other angina (HCC) Dr. Royann Shivers follows; has nitroglycerine PRN, recently impred Control blood pressure, cholesterol (LDL <70), glucose, increase exercise.   Essential hypertension Continue medications Follows with Cardiology Monitor blood pressure at home; call if consistently over 130/80 Continue DASH diet.   Reminder to go to the ER if any CP, SOB, nausea, dizziness, severe HA, changes vision/speech, left arm numbness and tingling and jaw pain. Followed by cardiology as well  Irritable bowel syndrome, unspecified type Avoid triggers, add soluble fiber if not taking regularly  Gastroesophageal reflux disease, esophagitis presence not specified Well managed on current medications: pantropazole Discussed diet, avoiding triggers and other lifestyle changes  Diverticulosis Increase soluble fiber, UTD on colonoscopies Doing well today  Vitamin D deficiency Continue supplementation Check vitamin D level  Prediabetes Discussed disease and risks Discussed diet/exercise, weight management  -A1C  Hyperlipidemia Continue medications:Zetia10mg  and rosuvastatin 20mg  4 days/week Continue low cholesterol diet and exercise.  -Lipids  Recurrent major depressive disorder, in partial remission (HCC) Continue medications: Wellbutrin XL 150mg  daily Lifestyle discussed: diet/exerise, sleep hygiene, stress management, hydration  Anxiety state Well managed by current regimen;  uses benzo rarely Stress management techniques discussed, increase water, good sleep hygiene discussed, increase exercise, and increase veggies.   Anemia, unspecified type -CBC  Morbid obesity, BMI 38 Long discussion about weight loss, diet, and exercise Recommended diet heavy in fruits and veggies and low in animal meats, cheeses, and dairy products, appropriate  calorie intake Patient will work on restarting weight watchers, sister will help with accountability Exercise suggestions reviewed, planning into day, look into water aerobics Discussed appropriate weight for height  Follow up at next visit  Medication management Continued  Over 40 minutes of face to face exam, counseling, chart review and critical decision making was performed.  Future Appointments  Date Time Provider Department Center  03/11/2022  2:30 PM , NP [facility] None  08/16/2022  3:00 PM [name], MD [facility] None     Plan:   During the course of the visit the patient was educated and counseled about appropriate screening and preventive services including:   Pneumococcal vaccine  Prevnar 13 Influenza vaccine Td vaccine Screening electrocardiogram Bone densitometry screening Colorectal cancer screening Diabetes screening Glaucoma screening Nutrition counseling  Advanced directives: requested   Subjective:  Jade Singh is a 71 y.o. female who presents for Medicare Annual Wellness Visit and 3 month follow up. has Anxiety; Major depression in partial remission (HCC); IBS; Vitamin D deficiency; Hypertension; Hypercholesterolemia; GERD (gastroesophageal reflux disease); Morbid obesity (HCC) BMI 35+ with htn, hld, abn glucose; Other abnormal glucose; Diverticulosis; Atherosclerotic heart disease of native coronary artery with other forms of angina pectoris (HCC); and Family history of spinocerebellar ataxia type 6 (SCA6) on their problem list.  Mom passed with cancer and demenia 08/2020, glad no longer suffering; she is now on wellbutrin and doing much better. She has taken ambien 5 mg nightly for sleep and has done well for many years. Reports failed several other gents. Very rare valium use.   she has a diagnosis of GERD which is currently managed by protonix 40 mg daily (failed transition to famotidine). Well controlled.   BMI is There is  no height or weight on file to calculate BMI., she has been working on diet and exercise, back on weight watchers and doing well, but got off of  it, has regained 4 lb. Next goal is <200 lb, She drinks 65+ fluid ounces of water daily. Discussed restarting weight watchers with sister as accountability partner on Friday. Also has treadmill could use, discussed benefits of water aerobics for ortho concerns.  Wt Readings from Last 3 Encounters:  11/25/21 235 lb (106.6 kg)  08/11/21 229 lb (103.9 kg)  04/01/21 231 lb 12.8 oz (105.1 kg)   October 2020 she underwent a coronary CT angiogram.  She has a very high calcium score of 598 (96th percentile).  She had several moderate stenoses including 50% in the left main, 50-69% in the proximal LAD, 40-59% in the left circumflex. Cardiac catheterization performed in October 2020 showed "minimal luminal irregularities, no obstructive CAD". She has had intermittent angina episodes associated with stress/anxiety that respond to nitroglycerine. Recently improved. Dr. Sallyanne Kuster follows.   Her blood pressure has been controlled at home (130s/70s), today their BP is    She does workout. She denies chest pain, shortness of breath, dizziness.   She is on cholesterol medication (rosuvastatin 20 mg MWFSat) and denies myalgias. Her cholesterol is at goal. The cholesterol last visit was:   Lab Results  Component Value Date   CHOL 126 11/25/2021   HDL 63 11/25/2021   LDLCALC 45 11/25/2021   TRIG 98 11/25/2021   CHOLHDL 2.0 11/25/2021    She has been working on diet and exercise for prediabetes, and denies increased appetite, nausea, paresthesia of the feet, polydipsia, polyuria and visual disturbances. Last A1C in the office was:  Lab Results  Component Value Date   HGBA1C 5.9 (H) 08/11/2021    Last GFR:  Lab Results  Component Value Date   GFRNONAA 68 11/26/2020   Patient is on Vitamin D supplement.   Lab Results  Component Value Date   VD25OH 74 08/11/2021        Medication Review: Current Outpatient Medications on File Prior to Visit  Medication Sig Dispense Refill   aspirin 81 MG tablet Take 81 mg by mouth daily.     buPROPion (WELLBUTRIN XL) 150 MG 24 hr tablet Take 1 tablet (150 mg total) by mouth every morning. 90 tablet 3   calcipotriene (DOVONOX) 0.005 % ointment Apply 1 application topically as needed (Hands).     cephALEXin (KEFLEX) 500 MG capsule Take 500 mg by mouth as needed. (Patient not taking: Reported on 08/11/2021)     cetirizine (ZYRTEC) 10 MG tablet Take 10 mg by mouth daily.     Cholecalciferol (VITAMIN D3) 5000 UNITS CAPS Take 5,000 Units by mouth 4 (four) times a week. Takes M,W,TH,F     diazepam (VALIUM) 10 MG tablet Take 1/2-1 tablet at Bedtime ONLY if needed for Sleep &  limit to 5 days /week to avoid addiction (Patient taking differently: Take 5-10 mg by mouth daily as needed for anxiety or sleep.) 90 tablet 0   ezetimibe (ZETIA) 10 MG tablet Take 1 tablet (10 mg total) by mouth daily. SCHEDULE OFFICE VISIT FOR FUTURE REFILLS 30 tablet 0   gabapentin (NEURONTIN) 100 MG capsule Take 1 to 3 capsules  1 to 2 hours  before Bedtime  as needed for  Sleep 90 capsule 2   Magnesium 250 MG TABS Take 250 mg by mouth daily.     Multiple Vitamins-Minerals (MULTIVITAMIN WITH MINERALS) tablet Take 1 tablet by mouth daily.     naproxen sodium (ALEVE) 220 MG tablet Take 440 mg by mouth as needed (Knees).     nitroGLYCERIN (NITROSTAT) 0.4  MG SL tablet Place 1 tablet (0.4 mg total) under the tongue every 5 (five) minutes as needed for chest pain. 25 tablet 1   pantoprazole (PROTONIX) 40 MG tablet TAKE 1 TABLET DAILY TO PREVENT INDIGESTION & HEARTBURN 90 tablet 3   rosuvastatin (CRESTOR) 20 MG tablet TAJE 1 TABLET BY MOUTH SUN TUES THURS SAT FOR CHOLESTEROL 48 tablet 3   triamterene-hydrochlorothiazide (MAXZIDE-25) 37.5-25 MG tablet TAKE 1 TABLET BY MOUTH EVERYDAY AT BEDTIME 15 tablet 0   zolpidem (AMBIEN) 5 MG tablet TAKE ONE TABLET BY MOUTH  DAILY 1 HOUR BEFORE BEDTIME 30 tablet 0   No current facility-administered medications on file prior to visit.    Allergies  Allergen Reactions   Doxycycline Other (See Comments)    Jittery and causes nausea   Levaquin [Levofloxacin In D5w]     yeast   Lexapro [Escitalopram Oxalate]     Had wheezing and palpitations   Oruvail [Ketoprofen]     GI upset    Current Problems (verified) Patient Active Problem List   Diagnosis Date Noted   Family history of spinocerebellar ataxia type 6 (SCA6) 11/25/2021   Atherosclerotic heart disease of native coronary artery with other forms of angina pectoris (HCC) 10/29/2020   Diverticulosis    Other abnormal glucose 06/25/2014   Vitamin D deficiency    Hypertension    Hypercholesterolemia    GERD (gastroesophageal reflux disease)    Morbid obesity (HCC) BMI 35+ with htn, hld, abn glucose    Anxiety 11/06/2007   Major depression in partial remission (HCC) 11/06/2007   IBS 11/06/2007    Screening Tests Immunization History  Administered Date(s) Administered   Fluad Quad(high Dose 65+) 04/02/2019   Influenza Inj Mdck Quad With Preservative 05/17/2017   Influenza Split 06/25/2014, 05/08/2015   Influenza, High Dose Seasonal PF 05/09/2018, 04/09/2020   Influenza,inj,quad, With Preservative 04/13/2016   Influenza-Unspecified 05/28/2013, 07/07/2021   Moderna Covid-19 Vaccine Bivalent Booster 50yrs & up 07/07/2021   Moderna Sars-Covid-2 Vaccination 08/24/2019, 09/22/2019, 05/13/2020   PPD Test 08/29/2013   Pneumococcal Conjugate-13 06/08/2017   Pneumococcal Polysaccharide-23 06/07/2013, 06/08/2018   Td 01/02/2020   Tdap 05/19/2009   Zoster, Live 06/25/2014   Tetanus: 12/2019 Pneumovax: 2014, 2019  Prevnar 13: 2018  Flu vaccine: 2021 Zostavax: 2015 - check with insurance  Covid 19: 2/2, last booster 05/13/2020  Pap: S/p hysterectomy MGM: 01/2021 DEXA: 04/2019 T-0.7, normal   Colonoscopy: 2015, Dr. Juanda Chance, 10 year recall EGD: -    Last Eye Exam: [facility], 01/2020, has upcoming in 05/2021 Last Dental Exam: [facility] 7/ 2022 q24months  Patient Care Team: [name], MD as PCP - General (Internal Medicine) Thurmon Fair, MD as PCP - Cardiology (Cardiology) Thurmon Fair, MD as Consulting Physician (Cardiology) Jene Every, MD as Consulting Physician (Orthopedic Surgery)  SURGICAL HISTORY She  has a past surgical history that includes Knee arthroscopy; Appendectomy; Tubal ligation; Abdominal hysterectomy; Tonsillectomy and adenoidectomy; Cardiac catheterization (2005); Total knee arthroplasty (Right, 02/28/2014); and LEFT HEART CATH AND CORONARY ANGIOGRAPHY (N/A, 05/11/2019). FAMILY HISTORY Her family history includes Ataxia in her maternal aunt, maternal aunt, and mother; Cancer (age of onset: 30) in her brother; Diabetes in her paternal uncle; Heart attack in her maternal grandfather and mother; Heart disease in her paternal grandfather; Hyperlipidemia in her mother; Stroke in her mother. SOCIAL HISTORY She  reports that she quit smoking about 21 years ago. Her smoking use included cigarettes. She has never used smokeless tobacco. She reports current alcohol use. She reports  that she does not use drugs.   MEDICARE WELLNESS OBJECTIVES: Physical activity:   Cardiac risk factors:   Depression/mood screen:      03/05/2021   11:10 AM  Depression screen PHQ 2/9  Decreased Interest 0  Down, Depressed, Hopeless 0  PHQ - 2 Score 0  Altered sleeping 0  Tired, decreased energy 1  Change in appetite 2  Feeling bad or failure about yourself  0  Trouble concentrating 0  Moving slowly or fidgety/restless 0  Suicidal thoughts 0  PHQ-9 Score 3  Difficult doing work/chores Not difficult at all    ADLs:      No data to display            Cognitive Testing  Alert? Yes  Normal Appearance?Yes  Oriented to person? Yes  Place? Yes   Time? Yes  Recall of three objects?  Yes  Can perform simple  calculations? Yes  Displays appropriate judgment?Yes  Can read the correct time from a watch face?Yes  EOL planning:    Review of Systems  Constitutional:  Negative for malaise/fatigue and weight loss.  HENT:  Negative for hearing loss and tinnitus.   Eyes:  Negative for blurred vision and double vision.  Respiratory:  Negative for cough, sputum production, shortness of breath and wheezing.   Cardiovascular:  Negative for chest pain, palpitations, orthopnea, claudication, leg swelling and PND.  Gastrointestinal:  Negative for abdominal pain, blood in stool, constipation, diarrhea, heartburn, melena, nausea and vomiting.  Genitourinary: Negative.   Musculoskeletal:  Positive for joint pain (bil knees, ortho follows). Negative for falls and myalgias.  Skin:  Negative for rash.  Neurological:  Negative for dizziness, tingling, sensory change, weakness and headaches.  Endo/Heme/Allergies:  Negative for polydipsia.  Psychiatric/Behavioral:  Negative for depression, memory loss, substance abuse and suicidal ideas. The patient is not nervous/anxious and does not have insomnia (Controlled on ambien).   All other systems reviewed and are negative.    Objective:     There were no vitals filed for this visit.  There is no height or weight on file to calculate BMI.  General appearance: alert, no distress, WD/WN, female HEENT: normocephalic, sclerae anicteric, TMs pearly, nares patent, no discharge or erythema, pharynx normal Oral cavity: MMM, no lesions Neck: supple, no lymphadenopathy, no thyromegaly, no masses Heart: RRR, normal S1, S2, no murmurs Lungs: CTA bilaterally, no wheezes, rhonchi, or rales Abdomen: +bs, soft, obese abdomen limits exam, non tender, non distended, no palpable masses, hepatomegaly, splenomegaly Musculoskeletal: nontender, mild effusion L knee (well healed midline incision), no erythema/heat, no obvious deformity, ROM of R knee limited in flexion Extremities: no  edema, no cyanosis, no clubbing Pulses: 2+ symmetric, upper and lower extremities, normal cap refill Neurological: alert, oriented x 3, CN2-12 intact, strength normal upper extremities and lower extremities, sensation normal throughout, DTRs 2+ throughout, no cerebellar signs, gait slow steady Psychiatric: normal affect, behavior normal, pleasant   Medicare Attestation I have personally reviewed: The patient's medical and social history Their use of alcohol, tobacco or illicit drugs Their current medications and supplements The patient's functional ability including ADLs,fall risks, home safety risks, cognitive, and hearing and visual impairment Diet and physical activities Evidence for depression or mood disorders  The patient's weight, height, BMI, and visual acuity have been recorded in the chart.  I have made referrals, counseling, and provided education to the patient based on review of the above and I have provided the patient with a written personalized care plan for  preventive services.     Alycia Rossetti, NP   03/10/2022

## 2022-03-11 ENCOUNTER — Ambulatory Visit (INDEPENDENT_AMBULATORY_CARE_PROVIDER_SITE_OTHER): Payer: Medicare Other | Admitting: Nurse Practitioner

## 2022-03-11 ENCOUNTER — Ambulatory Visit: Payer: Medicare Other | Admitting: Adult Health

## 2022-03-11 ENCOUNTER — Encounter: Payer: Self-pay | Admitting: Nurse Practitioner

## 2022-03-11 VITALS — BP 142/84 | HR 80 | Temp 97.7°F | Ht 65.0 in | Wt 238.6 lb

## 2022-03-11 DIAGNOSIS — D649 Anemia, unspecified: Secondary | ICD-10-CM | POA: Diagnosis not present

## 2022-03-11 DIAGNOSIS — R7309 Other abnormal glucose: Secondary | ICD-10-CM

## 2022-03-11 DIAGNOSIS — E559 Vitamin D deficiency, unspecified: Secondary | ICD-10-CM

## 2022-03-11 DIAGNOSIS — Z87891 Personal history of nicotine dependence: Secondary | ICD-10-CM

## 2022-03-11 DIAGNOSIS — K219 Gastro-esophageal reflux disease without esophagitis: Secondary | ICD-10-CM | POA: Diagnosis not present

## 2022-03-11 DIAGNOSIS — F3341 Major depressive disorder, recurrent, in partial remission: Secondary | ICD-10-CM

## 2022-03-11 DIAGNOSIS — Z0001 Encounter for general adult medical examination with abnormal findings: Secondary | ICD-10-CM

## 2022-03-11 DIAGNOSIS — I10 Essential (primary) hypertension: Secondary | ICD-10-CM

## 2022-03-11 DIAGNOSIS — Z79899 Other long term (current) drug therapy: Secondary | ICD-10-CM

## 2022-03-11 DIAGNOSIS — E782 Mixed hyperlipidemia: Secondary | ICD-10-CM | POA: Diagnosis not present

## 2022-03-11 DIAGNOSIS — K5790 Diverticulosis of intestine, part unspecified, without perforation or abscess without bleeding: Secondary | ICD-10-CM

## 2022-03-11 DIAGNOSIS — I25118 Atherosclerotic heart disease of native coronary artery with other forms of angina pectoris: Secondary | ICD-10-CM

## 2022-03-11 DIAGNOSIS — R6889 Other general symptoms and signs: Secondary | ICD-10-CM | POA: Diagnosis not present

## 2022-03-11 DIAGNOSIS — F419 Anxiety disorder, unspecified: Secondary | ICD-10-CM

## 2022-03-11 DIAGNOSIS — K589 Irritable bowel syndrome without diarrhea: Secondary | ICD-10-CM

## 2022-03-11 DIAGNOSIS — Z0000 Encounter for general adult medical examination without abnormal findings: Secondary | ICD-10-CM

## 2022-03-12 ENCOUNTER — Other Ambulatory Visit: Payer: Self-pay | Admitting: Nurse Practitioner

## 2022-03-12 DIAGNOSIS — N183 Chronic kidney disease, stage 3 unspecified: Secondary | ICD-10-CM

## 2022-03-12 LAB — COMPLETE METABOLIC PANEL WITH GFR
AG Ratio: 2.2 (calc) (ref 1.0–2.5)
ALT: 15 U/L (ref 6–29)
AST: 12 U/L (ref 10–35)
Albumin: 4.3 g/dL (ref 3.6–5.1)
Alkaline phosphatase (APISO): 62 U/L (ref 37–153)
BUN/Creatinine Ratio: 22 (calc) (ref 6–22)
BUN: 28 mg/dL — ABNORMAL HIGH (ref 7–25)
CO2: 23 mmol/L (ref 20–32)
Calcium: 9.5 mg/dL (ref 8.6–10.4)
Chloride: 106 mmol/L (ref 98–110)
Creat: 1.29 mg/dL — ABNORMAL HIGH (ref 0.50–1.05)
Globulin: 2 g/dL (calc) (ref 1.9–3.7)
Glucose, Bld: 93 mg/dL (ref 65–99)
Potassium: 4.4 mmol/L (ref 3.5–5.3)
Sodium: 142 mmol/L (ref 135–146)
Total Bilirubin: 0.4 mg/dL (ref 0.2–1.2)
Total Protein: 6.3 g/dL (ref 6.1–8.1)
eGFR: 45 mL/min/{1.73_m2} — ABNORMAL LOW (ref >=60)

## 2022-03-12 LAB — CBC WITH DIFFERENTIAL/PLATELET
Absolute Monocytes: 535 cells/uL (ref 200–950)
Basophils Absolute: 73 cells/uL (ref 0–200)
Basophils Relative: 0.9 %
Eosinophils Absolute: 211 cells/uL (ref 15–500)
Eosinophils Relative: 2.6 %
HCT: 47.2 % — ABNORMAL HIGH (ref 35.0–45.0)
Hemoglobin: 15.4 g/dL (ref 11.7–15.5)
Lymphs Abs: 2616 cells/uL (ref 850–3900)
MCH: 29.3 pg (ref 27.0–33.0)
MCHC: 32.6 g/dL (ref 32.0–36.0)
MCV: 89.7 fL (ref 80.0–100.0)
MPV: 11.6 fL (ref 7.5–12.5)
Monocytes Relative: 6.6 %
Neutro Abs: 4666 cells/uL (ref 1500–7800)
Neutrophils Relative %: 57.6 %
Platelets: 236 10*3/uL (ref 140–400)
RBC: 5.26 10*6/uL — ABNORMAL HIGH (ref 3.80–5.10)
RDW: 12.7 % (ref 11.0–15.0)
Total Lymphocyte: 32.3 %
WBC: 8.1 10*3/uL (ref 3.8–10.8)

## 2022-03-12 LAB — LIPID PANEL
Cholesterol: 132 mg/dL (ref <200)
HDL: 61 mg/dL (ref >=50)
LDL Cholesterol (Calc): 51 mg/dL (calc)
Non-HDL Cholesterol (Calc): 71 mg/dL (calc) (ref <130)
Total CHOL/HDL Ratio: 2.2 (calc) (ref <5.0)
Triglycerides: 112 mg/dL (ref <150)

## 2022-03-12 LAB — HEMOGLOBIN A1C
Hgb A1c MFr Bld: 5.6 % of total Hgb (ref <5.7)
Mean Plasma Glucose: 114 mg/dL
eAG (mmol/L): 6.3 mmol/L

## 2022-04-02 ENCOUNTER — Other Ambulatory Visit: Payer: Self-pay | Admitting: Nurse Practitioner

## 2022-04-02 DIAGNOSIS — Z87891 Personal history of nicotine dependence: Secondary | ICD-10-CM

## 2022-04-03 ENCOUNTER — Other Ambulatory Visit: Payer: Self-pay | Admitting: Cardiovascular Disease

## 2022-04-06 ENCOUNTER — Ambulatory Visit
Admission: RE | Admit: 2022-04-06 | Discharge: 2022-04-06 | Disposition: A | Discharge status: Home / Self Care | Payer: Medicare Other | Source: Ambulatory Visit | Attending: Nurse Practitioner | Admitting: Nurse Practitioner

## 2022-04-06 ENCOUNTER — Other Ambulatory Visit: Payer: Self-pay | Admitting: Nurse Practitioner

## 2022-04-06 DIAGNOSIS — G4700 Insomnia, unspecified: Secondary | ICD-10-CM

## 2022-04-06 DIAGNOSIS — J439 Emphysema, unspecified: Secondary | ICD-10-CM | POA: Diagnosis not present

## 2022-04-06 DIAGNOSIS — Z87891 Personal history of nicotine dependence: Secondary | ICD-10-CM

## 2022-04-08 ENCOUNTER — Encounter: Payer: Self-pay | Admitting: Nurse Practitioner

## 2022-04-08 DIAGNOSIS — J438 Other emphysema: Secondary | ICD-10-CM | POA: Insufficient documentation

## 2022-04-10 ENCOUNTER — Other Ambulatory Visit: Payer: Self-pay | Admitting: Nurse Practitioner

## 2022-04-10 DIAGNOSIS — F3341 Major depressive disorder, recurrent, in partial remission: Secondary | ICD-10-CM

## 2022-04-11 ENCOUNTER — Other Ambulatory Visit: Payer: Self-pay | Admitting: Nurse Practitioner

## 2022-04-11 DIAGNOSIS — J432 Centrilobular emphysema: Secondary | ICD-10-CM

## 2022-04-11 MED ORDER — ALBUTEROL SULFATE HFA 108 (90 BASE) MCG/ACT IN AERS: 2.0000 | INHALATION_SPRAY | Freq: Four times a day (QID) | RESPIRATORY_TRACT | 2 refills | Status: AC | PRN

## 2022-04-11 MED ORDER — BREZTRI AEROSPHERE 160-9-4.8 MCG/ACT IN AERO: 2.0000 | INHALATION_SPRAY | Freq: Two times a day (BID) | RESPIRATORY_TRACT | 2 refills | Status: DC

## 2022-04-15 ENCOUNTER — Ambulatory Visit (INDEPENDENT_AMBULATORY_CARE_PROVIDER_SITE_OTHER): Payer: Medicare Other

## 2022-04-15 DIAGNOSIS — N183 Chronic kidney disease, stage 3 unspecified: Secondary | ICD-10-CM | POA: Diagnosis not present

## 2022-04-15 NOTE — Progress Notes (Signed)
The patient came in for repeat labs today. She reports she continues to push fluids, mainly water. She reports no new issues or concerns today. She was advised to please keep an eye on her blood pressure as it was slightly elevated today.

## 2022-04-16 LAB — BASIC METABOLIC PANEL WITH GFR
BUN/Creatinine Ratio: 25 (calc) — ABNORMAL HIGH (ref 6–22)
BUN: 29 mg/dL — ABNORMAL HIGH (ref 7–25)
CO2: 28 mmol/L (ref 20–32)
Calcium: 8.9 mg/dL (ref 8.6–10.4)
Chloride: 107 mmol/L (ref 98–110)
Creat: 1.17 mg/dL — ABNORMAL HIGH (ref 0.50–1.05)
Glucose, Bld: 90 mg/dL (ref 65–99)
Potassium: 4.2 mmol/L (ref 3.5–5.3)
Sodium: 143 mmol/L (ref 135–146)
eGFR: 51 mL/min/{1.73_m2} — ABNORMAL LOW (ref >=60)

## 2022-04-20 ENCOUNTER — Other Ambulatory Visit: Payer: Self-pay | Admitting: Internal Medicine

## 2022-04-20 DIAGNOSIS — K219 Gastro-esophageal reflux disease without esophagitis: Secondary | ICD-10-CM

## 2022-04-28 ENCOUNTER — Ambulatory Visit: Payer: Medicare Other | Admitting: Pulmonary Disease

## 2022-04-28 ENCOUNTER — Encounter: Payer: Self-pay | Admitting: Pulmonary Disease

## 2022-04-28 VITALS — BP 128/84 | HR 70 | Ht 65.5 in | Wt 233.4 lb

## 2022-04-28 DIAGNOSIS — J438 Other emphysema: Secondary | ICD-10-CM

## 2022-04-28 DIAGNOSIS — R0609 Other forms of dyspnea: Secondary | ICD-10-CM | POA: Diagnosis not present

## 2022-04-28 NOTE — Patient Instructions (Signed)
Use Breztri 2 puffs twice a day Make sure you rinse after use  Obtain pulmonary function test at next visit  Follow-up in 3 months  Graded exercise as tolerated  Call us with significant concerns

## 2022-04-28 NOTE — Addendum Note (Signed)
Addended by: Dessie Coma on: 04/28/2022 02:56 PM   Modules accepted: Orders

## 2022-04-28 NOTE — Progress Notes (Signed)
Jade Singh    [id number]    1950/07/24  Primary Care Physician:McKeown, Gwyndolyn Saxon, MD  Referring Physician: Alycia Rossetti, NP [address] [place],  [place] [address]  Chief complaint:   Patient being seen for emphysema  HPI:  Recently had a CT showing areas of emphysema, no lung nodules  Reformed smoker quit in 2000  No occupational predisposition  History of hypertension,  Admits to shortness of breath with significant activity Usual chores does not usually make her short of breath Limited by knee pain  She is working on weight loss She does have some cough in the mornings  Denies any other significant concerns at present  Was recently prescribed Breztri  Outpatient Encounter Medications as of 04/28/2022  Medication Sig   albuterol (VENTOLIN HFA) 108 (90 Base) MCG/ACT inhaler Inhale 2 puffs into the lungs every 6 (six) hours as needed for wheezing or shortness of breath.   aspirin 81 MG tablet Take 81 mg by mouth daily.   Budeson-Glycopyrrol-Formoterol (BREZTRI AEROSPHERE) 160-9-4.8 MCG/ACT AERO Inhale 2 puffs into the lungs in the morning and at bedtime.   buPROPion (WELLBUTRIN XL) 150 MG 24 hr tablet TAKE 1 TABLET BY MOUTH EVERY MORNING   calcipotriene (DOVONOX) 0.005 % ointment Apply 1 application topically as needed (Hands).   cetirizine (ZYRTEC) 10 MG tablet Take 10 mg by mouth daily.   Cholecalciferol (VITAMIN D3) 5000 UNITS CAPS Take 5,000 Units by mouth 4 (four) times a week. Takes M,W,TH,F   ezetimibe (ZETIA) 10 MG tablet Take 1 tablet (10 mg total) by mouth daily. Keep upcoming appointment for future refills.   gabapentin (NEURONTIN) 100 MG capsule Take 1 to 3 capsules  1 to 2 hours  before Bedtime  as needed for  Sleep   Magnesium 250 MG TABS Take 250 mg by mouth daily.   Multiple Vitamins-Minerals (MULTIVITAMIN WITH MINERALS) tablet Take 1 tablet by mouth daily.   naproxen sodium (ALEVE) 220 MG tablet Take 440 mg by mouth as  needed (Knees).   pantoprazole (PROTONIX) 40 MG tablet TAKE 1 TABLET DAILY TO PREVENT INDIGESTION & HEARTBURN   rosuvastatin (CRESTOR) 20 MG tablet TAJE 1 TABLET BY MOUTH SUN TUES THURS SAT FOR CHOLESTEROL   triamterene-hydrochlorothiazide (MAXZIDE-25) 37.5-25 MG tablet TAKE 1 TABLET BY MOUTH EVERYDAY AT BEDTIME   zolpidem (AMBIEN) 5 MG tablet TAKE 1 TABLET BY MOUTH DAILY 1 HOUR BEFORE BEDTIME   nitroGLYCERIN (NITROSTAT) 0.4 MG SL tablet Place 1 tablet (0.4 mg total) under the tongue every 5 (five) minutes as needed for chest pain.   No facility-administered encounter medications on file as of 04/28/2022.    Allergies as of 04/28/2022 - Review Complete 04/28/2022  Allergen Reaction Noted   Lexapro [escitalopram oxalate] Palpitations 10/29/2020   Doxycycline Other (See Comments) 07/06/2018   Levaquin [levofloxacin in d5w]  06/06/2013   Oruvail [ketoprofen] Nausea Only 06/06/2013    Past Medical History:  Diagnosis Date   ANAL FISSURE, HX OF 11/06/2007   Qualifier: Diagnosis of  By: Julaine Hua CMA (AAMA), Amanda     Anxiety    Depression    Difficulty sleeping    takes Ambien   Diverticulosis 5/1/5   DJD (degenerative joint disease)    Family history of anesthesia complication    "mother quit breathing" 30 yrs ago pt does not know any more details. 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   Unspecified vitamin D deficiency    Urgency of urination     Past Surgical History:  Procedure Laterality Date   ABDOMINAL HYSTERECTOMY     partial   APPENDECTOMY     CARDIAC CATHETERIZATION  2005    no problems per pt    KNEE ARTHROSCOPY     RT 2011, LT 2009   LEFT HEART CATH AND CORONARY ANGIOGRAPHY N/A 05/11/2019   Procedure: LEFT HEART CATH AND CORONARY ANGIOGRAPHY;  Surgeon: Wellington Hampshire, MD;   Location: [facility];  Service: Cardiovascular;  Laterality: N/A;   TONSILLECTOMY AND ADENOIDECTOMY     TOTAL KNEE ARTHROPLASTY Right 02/28/2014   Procedure: RIGHT TOTAL KNEE ARTHROPLASTY;  Surgeon: Johnn Hai, MD;  Location: [facility];  Service: Orthopedics;  Laterality: Right;   TUBAL LIGATION      Family History  Problem Relation Age of Onset   Stroke Mother    Hyperlipidemia Mother    Heart attack Mother    Ataxia Mother        cerebellar ataxia 43   Cancer Brother 70       prostate, deceased   Heart attack Maternal Grandfather    Heart disease Paternal Grandfather    Diabetes Paternal Uncle    Ataxia Maternal Aunt    Ataxia Maternal Aunt    Colon cancer Neg Hx    Breast cancer Neg Hx     Social History   Socioeconomic History   Marital status: Married    Spouse name: Not on file   Number of children: Not on file   Years of education: Not on file   Highest education level: Not on file  Occupational History   Not on file  Tobacco Use   Smoking status: Former    Packs/day: 3.00    Years: 21.00    Total pack years: 63.00    Types: Cigarettes    Start date: 07/19/1969    Quit date: 07/19/1998    Years since quitting: 23.7   Smokeless tobacco: Never  Substance and Sexual Activity   Alcohol use: Yes    Comment: occ   Drug use: No   Sexual activity: Not on file  Other Topics Concern   Not on file  Social History Narrative   Not on file   Social Determinants of Health   Financial Resource Strain: Not on file  Food Insecurity: Not on file  Transportation Needs: Not on file  Physical Activity: Not on file  Stress: Not on file  Social Connections: Not on file  Intimate Partner Violence: Not on file    Review of Systems  Constitutional:  Negative for fatigue.  Respiratory:  Positive for cough and shortness of breath.     Vitals:   04/28/22 1406  BP: 128/84  Pulse: 70  SpO2: 98%     Physical Exam Constitutional:      Appearance: She is  obese.  HENT:     Head: Normocephalic.     Mouth/Throat:     Mouth: Mucous membranes are moist.  Cardiovascular:     Rate and Rhythm: Normal rate and regular rhythm.     Heart sounds: No murmur heard.    No friction rub.  Pulmonary:     Effort: No respiratory distress.     Breath sounds: No  stridor. No wheezing or rhonchi.  Musculoskeletal:     Cervical back: No rigidity or tenderness.  Neurological:     Mental Status: She is alert.  Psychiatric:        Mood and Affect: Mood normal.      Data Reviewed: CT scan of the chest was reviewed with the patient showing emphysematous changes in the upper lobes  Assessment:  Obstructive lung disease  Shortness of breath with activity  History of hypertension  Plan/Recommendations: Obstructive lung disease -Obtain pulmonary function test to ascertain severity of lung dysfunction  Encouraged to start Breztri  Graded activities as tolerated  Weight loss efforts encouraged  Will follow in 3 months    Sherrilyn Rist MD [facility] 04/28/2022, 2:42 PM  CC: Alycia Rossetti, NP

## 2022-05-07 ENCOUNTER — Other Ambulatory Visit: Payer: Self-pay | Admitting: Nurse Practitioner

## 2022-05-07 ENCOUNTER — Other Ambulatory Visit: Payer: Self-pay | Admitting: Cardiovascular Disease

## 2022-05-07 DIAGNOSIS — G4700 Insomnia, unspecified: Secondary | ICD-10-CM

## 2022-06-09 ENCOUNTER — Other Ambulatory Visit: Payer: Self-pay | Admitting: Nurse Practitioner

## 2022-06-09 DIAGNOSIS — G4700 Insomnia, unspecified: Secondary | ICD-10-CM

## 2022-06-21 DIAGNOSIS — H43812 Vitreous degeneration, left eye: Secondary | ICD-10-CM | POA: Diagnosis not present

## 2022-06-21 DIAGNOSIS — H4312 Vitreous hemorrhage, left eye: Secondary | ICD-10-CM | POA: Diagnosis not present

## 2022-06-21 DIAGNOSIS — H2513 Age-related nuclear cataract, bilateral: Secondary | ICD-10-CM | POA: Diagnosis not present

## 2022-06-21 DIAGNOSIS — H43821 Vitreomacular adhesion, right eye: Secondary | ICD-10-CM | POA: Diagnosis not present

## 2022-06-21 DIAGNOSIS — H35033 Hypertensive retinopathy, bilateral: Secondary | ICD-10-CM | POA: Diagnosis not present

## 2022-06-23 ENCOUNTER — Ambulatory Visit: Payer: Medicare Other | Admitting: Cardiovascular Disease

## 2022-06-29 DIAGNOSIS — H35033 Hypertensive retinopathy, bilateral: Secondary | ICD-10-CM | POA: Diagnosis not present

## 2022-06-29 DIAGNOSIS — H4312 Vitreous hemorrhage, left eye: Secondary | ICD-10-CM | POA: Diagnosis not present

## 2022-06-29 DIAGNOSIS — H2513 Age-related nuclear cataract, bilateral: Secondary | ICD-10-CM | POA: Diagnosis not present

## 2022-06-29 DIAGNOSIS — H43812 Vitreous degeneration, left eye: Secondary | ICD-10-CM | POA: Diagnosis not present

## 2022-06-29 DIAGNOSIS — H43821 Vitreomacular adhesion, right eye: Secondary | ICD-10-CM | POA: Diagnosis not present

## 2022-07-02 ENCOUNTER — Other Ambulatory Visit: Payer: Self-pay | Admitting: Cardiovascular Disease

## 2022-07-11 ENCOUNTER — Other Ambulatory Visit: Payer: Self-pay | Admitting: Nurse Practitioner

## 2022-07-11 DIAGNOSIS — G4700 Insomnia, unspecified: Secondary | ICD-10-CM

## 2022-07-14 ENCOUNTER — Other Ambulatory Visit: Payer: Self-pay | Admitting: Nurse Practitioner

## 2022-07-14 DIAGNOSIS — G4700 Insomnia, unspecified: Secondary | ICD-10-CM

## 2022-07-14 MED ORDER — ZOLPIDEM TARTRATE 5 MG PO TABS: ORAL_TABLET | ORAL | 0 refills | Status: DC

## 2022-07-23 ENCOUNTER — Ambulatory Visit: Payer: Medicare Other | Admitting: Pulmonary Disease

## 2022-07-23 ENCOUNTER — Encounter: Payer: Self-pay | Admitting: Pulmonary Disease

## 2022-07-23 ENCOUNTER — Ambulatory Visit (INDEPENDENT_AMBULATORY_CARE_PROVIDER_SITE_OTHER): Payer: Medicare Other | Admitting: Pulmonary Disease

## 2022-07-23 VITALS — BP 136/74 | HR 84 | Temp 97.7°F | Ht 66.5 in | Wt 232.0 lb

## 2022-07-23 DIAGNOSIS — R0609 Other forms of dyspnea: Secondary | ICD-10-CM

## 2022-07-23 DIAGNOSIS — J438 Other emphysema: Secondary | ICD-10-CM

## 2022-07-23 DIAGNOSIS — Z2911 Encounter for prophylactic immunotherapy for respiratory syncytial virus (RSV): Secondary | ICD-10-CM

## 2022-07-23 DIAGNOSIS — Z23 Encounter for immunization: Secondary | ICD-10-CM | POA: Diagnosis not present

## 2022-07-23 LAB — PULMONARY FUNCTION TEST
DL/VA % pred: 111 %
DL/VA: 4.58 ml/min/mmHg/L
DLCO cor % pred: 78 %
DLCO cor: 16.03 ml/min/mmHg
DLCO unc % pred: 78 %
DLCO unc: 16.03 ml/min/mmHg
FEF 25-75 Post: 4.08 L/sec
FEF 25-75 Pre: 2.15 L/sec
FEF2575-%Change-Post: 89 %
FEF2575-%Pred-Post: 205 %
FEF2575-%Pred-Pre: 108 %
FEV1-%Change-Post: 15 %
FEV1-%Pred-Post: 90 %
FEV1-%Pred-Pre: 77 %
FEV1-Post: 2.18 L
FEV1-Pre: 1.88 L
FEV1FVC-%Change-Post: 3 %
FEV1FVC-%Pred-Pre: 109 %
FEV6-%Change-Post: 11 %
FEV6-%Pred-Post: 83 %
FEV6-%Pred-Pre: 74 %
FEV6-Post: 2.54 L
FEV6-Pre: 2.27 L
FEV6FVC-%Pred-Post: 104 %
FEV6FVC-%Pred-Pre: 104 %
FVC-%Change-Post: 11 %
FVC-%Pred-Post: 79 %
FVC-%Pred-Pre: 71 %
FVC-Post: 2.54 L
FVC-Pre: 2.27 L
Post FEV1/FVC ratio: 86 %
Post FEV6/FVC ratio: 100 %
Pre FEV1/FVC ratio: 83 %
Pre FEV6/FVC Ratio: 100 %
RV % pred: 90 %
RV: 2.05 L
TLC % pred: 81 %
TLC: 4.28 L

## 2022-07-23 NOTE — Patient Instructions (Signed)
Full PFT performed today. °

## 2022-07-23 NOTE — Addendum Note (Signed)
Addended byOralia Rud M on: 07/23/2022 12:03 PM   Modules accepted: Orders

## 2022-07-23 NOTE — Progress Notes (Signed)
Full PFT performed today. °

## 2022-07-23 NOTE — Progress Notes (Signed)
Jade Singh    [id number]    [birth year]  Primary Care Physician:McKeown, Chrissie Noa, MD  Referring Physician: [name], MD [address] [place],  [place] [address]  Chief complaint:   Patient being seen for emphysema  HPI:  In for follow-up today  Reformed smoker quit in 2000 Emphysema on CT  Shortness of breath with activity  She tries to stay active Limited by knee pain  Uses Breztri and tolerates it well  History of hypertension  She is working on weight loss She does have some cough in the mornings  Denies any other significant concerns at present  Outpatient Encounter Medications as of 07/23/2022  Medication Sig   albuterol (VENTOLIN HFA) 108 (90 Base) MCG/ACT inhaler Inhale 2 puffs into the lungs every 6 (six) hours as needed for wheezing or shortness of breath.   aspirin 81 MG tablet Take 81 mg by mouth daily.   Budeson-Glycopyrrol-Formoterol (BREZTRI AEROSPHERE) 160-9-4.8 MCG/ACT AERO Inhale 2 puffs into the lungs in the morning and at bedtime.   buPROPion (WELLBUTRIN XL) 150 MG 24 hr tablet TAKE 1 TABLET BY MOUTH EVERY MORNING   calcipotriene (DOVONOX) 0.005 % ointment Apply 1 application topically as needed (Hands).   cetirizine (ZYRTEC) 10 MG tablet Take 10 mg by mouth daily.   Cholecalciferol (VITAMIN D3) 5000 UNITS CAPS Take 5,000 Units by mouth 4 (four) times a week. Takes M,W,TH,F   ezetimibe (ZETIA) 10 MG tablet TAKE 1 TABLET BY MOUTH DAILY KEEP UPCOMING APPOINTMENT FOR FUTURE REFILLS   Magnesium 250 MG TABS Take 250 mg by mouth daily.   Multiple Vitamins-Minerals (MULTIVITAMIN WITH MINERALS) tablet Take 1 tablet by mouth daily.   pantoprazole (PROTONIX) 40 MG tablet TAKE 1 TABLET DAILY TO PREVENT INDIGESTION & HEARTBURN   rosuvastatin (CRESTOR) 20 MG tablet TAJE 1 TABLET BY MOUTH SUN TUES THURS SAT FOR CHOLESTEROL   triamterene-hydrochlorothiazide (MAXZIDE-25) 37.5-25 MG tablet Take 1 tablet by mouth at bedtime. KEEP  UPCOMING APPOINTMENT FOR FUTURE REFILLS.   zolpidem (AMBIEN) 5 MG tablet TAKE ONE TABLET BY MOUTH EVERY EVENING ONE HOUR BEFORE BEDTIME   nitroGLYCERIN (NITROSTAT) 0.4 MG SL tablet Place 1 tablet (0.4 mg total) under the tongue every 5 (five) minutes as needed for chest pain.   No facility-administered encounter medications on file as of 07/23/2022.    Allergies as of 07/23/2022 - Review Complete 07/23/2022  Allergen Reaction Noted   Lexapro [escitalopram oxalate] Palpitations 10/29/2020   Doxycycline Other (See Comments) 07/06/2018   Levaquin [levofloxacin in d5w]  06/06/2013   Oruvail [ketoprofen] Nausea Only 06/06/2013    Past Medical History:  Diagnosis Date   ANAL FISSURE, HX OF 11/06/2007   Qualifier: Diagnosis of  By: Misty Stanley CMA (AAMA), Amanda     Anxiety    Depression    Difficulty sleeping    takes Ambien   Diverticulosis 5/1/5   DJD (degenerative joint disease)    Family history of anesthesia complication    "mother quit breathing" 30 yrs ago pt does not know any more details. 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   Unspecified vitamin D deficiency    Urgency of urination     Past Surgical History:  Procedure Laterality  Date   ABDOMINAL HYSTERECTOMY     partial   APPENDECTOMY     CARDIAC CATHETERIZATION  2005    no problems per pt    KNEE ARTHROSCOPY     RT 2011, LT 2009   LEFT HEART CATH AND CORONARY ANGIOGRAPHY N/A 05/11/2019   Procedure: LEFT HEART CATH AND CORONARY ANGIOGRAPHY;  Surgeon: Wellington Hampshire, MD;  Location: [facility];  Service: Cardiovascular;  Laterality: N/A;   TONSILLECTOMY AND ADENOIDECTOMY     TOTAL KNEE ARTHROPLASTY Right 02/28/2014   Procedure: RIGHT TOTAL KNEE ARTHROPLASTY;  Surgeon: Johnn Hai, MD;  Location: [facility];  Service:  Orthopedics;  Laterality: Right;   TUBAL LIGATION      Family History  Problem Relation Age of Onset   Stroke Mother    Hyperlipidemia Mother    Heart attack Mother    Ataxia Mother        cerebellar ataxia 54   Cancer Brother 42       prostate, deceased   Heart attack Maternal Grandfather    Heart disease Paternal Grandfather    Diabetes Paternal Uncle    Ataxia Maternal Aunt    Ataxia Maternal Aunt    Colon cancer Neg Hx    Breast cancer Neg Hx     Social History   Socioeconomic History   Marital status: Married    Spouse name: Not on file   Number of children: Not on file   Years of education: Not on file   Highest education level: Not on file  Occupational History   Not on file  Tobacco Use   Smoking status: Former    Packs/day: 3.00    Years: 21.00    Total pack years: 63.00    Types: Cigarettes    Start date: 07/19/1969    Quit date: 07/19/1998    Years since quitting: 24.0   Smokeless tobacco: Never  Substance and Sexual Activity   Alcohol use: Yes    Comment: occ   Drug use: No   Sexual activity: Not on file  Other Topics Concern   Not on file  Social History Narrative   Not on file   Social Determinants of Health   Financial Resource Strain: Not on file  Food Insecurity: Not on file  Transportation Needs: Not on file  Physical Activity: Not on file  Stress: Not on file  Social Connections: Not on file  Intimate Partner Violence: Not on file    Review of Systems  Constitutional:  Negative for fatigue.  Respiratory:  Positive for shortness of breath. Negative for cough.     Vitals:   07/23/22 1013  BP: 136/74  Pulse: 84  Temp: 97.7 F (36.5 C)  SpO2: 98%     Physical Exam Constitutional:      Appearance: She is obese.  HENT:     Head: Normocephalic.     Mouth/Throat:     Mouth: Mucous membranes are moist.  Eyes:     Pupils: Pupils are equal, round, and reactive to light.  Cardiovascular:     Rate and Rhythm: Normal rate and  regular rhythm.     Heart sounds: No murmur heard.    No friction rub.  Pulmonary:     Effort: No respiratory distress.     Breath sounds: No stridor. No wheezing or rhonchi.  Musculoskeletal:     Cervical back: No rigidity or tenderness.  Neurological:     Mental Status: She is alert.  Psychiatric:        Mood and Affect: Mood normal.      Data Reviewed: CT scan of the chest was reviewed with the patient showing emphysematous changes in the upper lobes  PFT with mild obstructive disease with significant bronchodilator response  Assessment:  Obstructive lung disease -Mild obstructive disease on PFT  Shortness of breath with activity -Likely multifactorial  History of hypertension  Plan/Recommendations: Administer flu shot today  Administer RSV shot today  Continue Breztri  Albuterol as needed  Weight loss efforts as able  Follow-up in 6 months  Sherrilyn Rist MD [facility] 07/23/2022, 10:23 AM  CC: Unk Pinto, MD

## 2022-07-23 NOTE — Patient Instructions (Addendum)
Administer flu shot today  Administer RSV shot today  Your breathing study does show significant improvement with albuterol use Continue using Breztri  Use albuterol only as needed  Follow-up in 6 months as long as you continue to do well  Graded activities as tolerated

## 2022-07-26 ENCOUNTER — Ambulatory Visit: Payer: Medicare Other | Admitting: Cardiovascular Disease

## 2022-08-07 ENCOUNTER — Other Ambulatory Visit: Payer: Self-pay | Admitting: Cardiovascular Disease

## 2022-08-10 ENCOUNTER — Other Ambulatory Visit: Payer: Self-pay | Admitting: Nurse Practitioner

## 2022-08-10 DIAGNOSIS — G4700 Insomnia, unspecified: Secondary | ICD-10-CM

## 2022-08-15 NOTE — Progress Notes (Unsigned)
Comprehensive Evaluation &  Examination   Future Appointments  Date Time Provider Department  08/11/2021  3:00 PM [name], MD [facility]  03/11/2022             Wellness 11:00 AM Judd Gaudier, NP [facility]  08/16/2022  3:00 PM [name], MD [facility]        This very nice 72 y.o.f  MWF  presents for a comprehensive evaluation and management of multiple medical co-morbidities.  Patient has been followed for HTN, HLD, Prediabetes  and Vitamin D Deficiency. Patient has hx/o GERD controlled on her meds.         HTN predates circa 2011.   Heart cath in 2020 was normal;  Patient's BP has been controlled at home and patient denies any cardiac symptoms as chest pain, palpitations, shortness of breath, dizziness or ankle swelling. Today's BP is at goal - 136/76.       Patient's hyperlipidemia is controlled with diet and  g Crestor / Ezetimibe  : Patient denies myalgias or other medication SE's. Last lipids were at goal :  Lab Results  Component Value Date   CHOL 136 03/05/2021   HDL 66 03/05/2021   LDLCALC 53 03/05/2021   TRIG 88 03/05/2021   CHOLHDL 2.1 03/05/2021         Patient has Morbid Obesity (BMI 39+)  with  consequent hx/o prediabetes (A1c 5.7%  /2015)  and patient denies reactive hypoglycemic symptoms, visual blurring, diabetic polys or paresthesias. Last A1c was  normal & at goal :  Lab Results  Component Value Date   HGBA1C 5.6 03/05/2021         Finally, patient has history of Vitamin D Deficiency and last Vitamin D was at goal :  Lab Results  Component Value Date   VD25OH 77 07/25/2020     Current Outpatient Medications on File Prior to Visit  Medication Sig   aspirin 81 MG tablet Take 81 mg by mouth daily.   buPROPion XL 150 MG 24 hr tablet Take 1 tablet  every morning.   calcipotriene (DOVONOX) 0.005 % ointment Apply 1 application topically as needed (Hands).   cephALEXin (KEFLEX) 500 MG capsule Take as needed.   cetirizine (ZYRTEC) 10 MG  tablet Take daily.   Cholecalciferol (VITAMIN D3) 5000 UNITS CAPS Take 5,000 Units 4 times a week. Takes M,W,TH,F   ezetimibe (ZETIA) 10 MG tablet TAKE ONE TABLET DAILY   Magnesium 250 MG TABS Take 250 mg by mouth daily.   Multiple Vitamins-Minerals  Take 1 tablet by mouth daily.   naproxen sodium (ALEVE) 220 MG tablet Take 440 mg  as needed    NITROSTAT0.4 MG SL tablet as needed for chest pain.   pantoprazole  40 MG tablet TAKE 1 TABLET DAILY    rosuvastatin (CRESTOR) 20 MG tablet Take 1 tab Sun Tues Thurs Sat for cholesterol.   triamterene-hctz(MAXZIDE-25) 37.5-25 MG t TAKE 1 TABLET BY EVERYDAY AT BEDTIME   zolpidem 5 MG tablet TAKE ONE TABLET DAILY 1 HOUR BEFORE BEDTIME      Allergies  Allergen Reactions   Doxycycline Other (See Comments)    Jittery and causes nausea   Levaquin [Levofloxacin In D5w]     yeast   Lexapro [Escitalopram Oxalate]     Had wheezing and palpitations   Oruvail [Ketoprofen]     GI upset     Past Medical History:  Diagnosis Date   ANAL FISSURE, HX OF 11/06/2007   Qualifier: Diagnosis of  By: Misty Stanley CMA Duncan Dull), Amanda     Anxiety    Depression    Difficulty sleeping    takes Ambien   Diverticulosis 5/1/5   DJD (degenerative joint disease)    Family history of anesthesia complication    "mother quit breathing" 30 yrs ago pt does not know any more details. 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   Unspecified vitamin D deficiency    Urgency of urination      Health Maintenance  Topic Date Due   Zoster Vaccines- Shingrix (1 of 2) Never done   MAMMOGRAM  02/03/2022   COLONOSCOPY  11/17/2023   TETANUS/TDAP  01/01/2030   Pneumonia Vaccine 52+ Years old  Completed   INFLUENZA VACCINE  Completed   DEXA SCAN  Completed   COVID-19 Vaccine  Completed   Hepatitis  C Screening  Completed   HPV VACCINES  Aged Out     Immunization History  Administered Date(s) Administered   Fluad Quad(high Dose 65) 04/02/2019   Influenza Inj Mdck Quad  05/17/2017   Influenza  06/25/2014, 05/08/2015   Influenza, High Dose  05/09/2018, 04/09/2020   Influenza,inj,quad 04/13/2016   Influenza 05/28/2013, 07/07/2021   Moderna Covid-19  Bivalent Booster  07/07/2021   Moderna Sars-Covid-2 Vacc 08/24/2019, 09/22/2019, 05/13/2020   PPD Test 08/29/2013   Pneumococcal 13 06/08/2017   Pneumococcal -23 06/07/2013, 06/08/2018   Td 01/02/2020   Tdap 05/19/2009   Zoster, Live 06/25/2014     Last Colon - 2015  Dr Lina Sar - recc 10 year f/u due May 2025   Last MGM - 02/04/2021   Past Surgical History:  Procedure Laterality Date   ABDOMINAL HYSTERECTOMY     partial   APPENDECTOMY     CARDIAC CATHETERIZATION  2005    no problems per pt    KNEE ARTHROSCOPY     RT 2011, LT 2009   LEFT HEART CATH AND CORONARY ANGIOGRAPHY N/A 05/11/2019   Procedure: LEFT HEART CATH AND CORONARY ANGIOGRAPHY;  Surgeon: Iran Ouch, MD;  Location: [facility];  Service: Cardiovascular;  Laterality: N/A;   TONSILLECTOMY AND ADENOIDECTOMY     TOTAL KNEE ARTHROPLASTY Right 02/28/2014   Procedure: RIGHT TOTAL KNEE ARTHROPLASTY;  Surgeon: Javier Docker, MD;  Location: [facility];  Service: Orthopedics;  Laterality: Right;   TUBAL LIGATION       Family History  Problem Relation Age of Onset   Stroke Mother    Hyperlipidemia Mother    Heart attack Mother    Cancer Brother 51       prostate, deceased   Heart disease Paternal Grandfather    Heart attack Maternal Grandfather    Diabetes Paternal Uncle    Colon cancer Neg Hx    Breast cancer Neg Hx      Social History   Tobacco Use   Smoking status: Former    Types: Cigarettes    Quit date: 07/19/2000    Years since quitting: 21.0   Smokeless tobacco: Never  Substance Use Topics   Alcohol use: Yes    Comment: occ    Drug use: No      ROS Constitutional: Denies fever, chills, weight loss/gain, headaches, insomnia,  night sweats, and change in  appetite. Does c/o fatigue. Eyes: Denies redness, blurred vision, diplopia, discharge, itchy, watery eyes.  ENT: Denies discharge, congestion, post nasal drip, epistaxis, sore throat, earache, hearing loss, dental pain, Tinnitus, Vertigo, Sinus pain, snoring.  Cardio: Denies chest pain, palpitations, irregular heartbeat, syncope, dyspnea, diaphoresis, orthopnea, PND, claudication, edema Respiratory: denies cough, dyspnea, DOE, pleurisy, hoarseness, laryngitis, wheezing.  Gastrointestinal: Denies dysphagia, heartburn, reflux, water brash, pain, cramps, nausea, vomiting, bloating, diarrhea, constipation, hematemesis, melena, hematochezia, jaundice, hemorrhoids Genitourinary: Denies dysuria, frequency, urgency, nocturia, hesitancy, discharge, hematuria, flank pain Breast: Breast lumps, nipple discharge, bleeding.  Musculoskeletal: Denies arthralgia, myalgia, stiffness, Jt. Swelling, pain, limp, and strain/sprain. Denies falls. Skin: Denies puritis, rash, hives, warts, acne, eczema, changing in skin lesion Neuro: No weakness, tremor, incoordination, spasms, paresthesia, pain Psychiatric: Denies confusion, memory loss, sensory loss. Denies Depression. Endocrine: Denies change in weight, skin, hair change, nocturia, and paresthesia, diabetic polys, visual blurring, hyper / hypo glycemic episodes.  Heme/Lymph: No excessive bleeding, bruising, enlarged lymph nodes.  Physical Exam  BP 136/76   Pulse 82   Temp 97.9 F (36.6 C)   Resp 16   Ht 5\' 5"  (1.651 m)   Wt 229 lb (103.9 kg)   SpO2 98%   BMI 38.11 kg/m   General Appearance: Well nourished, well groomed and in no apparent distress.  Eyes: PERRLA, EOMs, conjunctiva no swelling or erythema, normal fundi and vessels. Sinuses: No frontal/maxillary tenderness ENT/Mouth: EACs patent / TMs  nl. Nares clear without  erythema, swelling, mucoid exudates. Oral hygiene is good. No erythema, swelling, or exudate. Tongue normal, non-obstructing. Tonsils not swollen or erythematous. Hearing normal.  Neck: Supple, thyroid not palpable. No bruits, nodes or JVD. Respiratory: Respiratory effort normal.  BS equal and clear bilateral without rales, rhonci, wheezing or stridor. Cardio: Heart sounds are normal with regular rate and rhythm and no murmurs, rubs or gallops. Peripheral pulses are normal and equal bilaterally without edema. No aortic or femoral bruits. Chest: symmetric with normal excursions and percussion. Breasts: Symmetric, without lumps, nipple discharge, retractions, or fibrocystic changes.  Abdomen: Flat, soft with bowel sounds active. Nontender, no guarding, rebound, hernias, masses, or organomegaly.  Lymphatics: Non tender without lymphadenopathy.  Musculoskeletal: Full ROM all peripheral extremities, joint stability, 5/5 strength, and normal gait. Skin: Warm and dry without rashes, lesions, cyanosis, clubbing or  ecchymosis.  Neuro: Cranial nerves intact, reflexes equal bilaterally. Normal muscle tone, no cerebellar symptoms. Sensation intact.  Pysch: Alert and oriented X 3, normal affect, Insight and Judgment appropriate.    Assessment and Plan\  1. Screening Exam    2. Essential hypertension  - EKG 12-Lead - Urinalysis, Routine w reflex microscopic - Microalbumin / creatinine urine ratio - CBC with Differential/Platelet - COMPLETE METABOLIC PANEL WITH GFR - Magnesium - TSH  3. Hyperlipidemia, mixed  - EKG 12-Lead - Lipid panel - TSH  4. Abnormal glucose  - EKG 12-Lead - Hemoglobin A1c - Insulin, random  5. Vitamin D deficiency  - VITAMIN D 25 Hydroxy (Vit-D Deficiency, Fractures)  6. Gastroesophageal reflux disease without esophagitis  - CBC with Differential/Platelet  7. Screening for colorectal cancer  - POC Hemoccult Bld/Stl   8. Screening for ischemic heart  disease  - EKG 12-Lead  9. FHx: heart disease  - EKG 12-Lead  10. Former smoker  - EKG 12-Lead  11. Medication management  - Urinalysis, Routine w reflex microscopic - Microalbumin / creatinine urine ratio - CBC with Differential/Platelet - COMPLETE METABOLIC PANEL WITH GFR - Magnesium - Lipid panel -  TSH - Hemoglobin A1c - Insulin, random - VITAMIN D 25 Hydroxy            Patient was counseled in prudent diet to achieve/maintain BMI less than 25 for weight control, BP monitoring, regular exercise and medications.  After discussion re: dangers of Ambien , patient is agreeable to try Gabapentin for Insomnia. Discussed med's effects and SE's. Screening labs and tests as requested with regular follow-up as recommended. Over 40 minutes of exam, counseling, chart review and high complex critical decision making was performed.   Kirtland Bouchard, MD

## 2022-08-16 ENCOUNTER — Ambulatory Visit (INDEPENDENT_AMBULATORY_CARE_PROVIDER_SITE_OTHER): Payer: Medicare Other | Admitting: Internal Medicine

## 2022-08-16 ENCOUNTER — Encounter: Payer: Self-pay | Admitting: Internal Medicine

## 2022-08-16 VITALS — BP 136/84 | HR 88 | Temp 97.5°F | Resp 16 | Ht 64.25 in | Wt 231.8 lb

## 2022-08-16 DIAGNOSIS — I2510 Atherosclerotic heart disease of native coronary artery without angina pectoris: Secondary | ICD-10-CM

## 2022-08-16 DIAGNOSIS — E782 Mixed hyperlipidemia: Secondary | ICD-10-CM

## 2022-08-16 DIAGNOSIS — Z79899 Other long term (current) drug therapy: Secondary | ICD-10-CM

## 2022-08-16 DIAGNOSIS — Z0000 Encounter for general adult medical examination without abnormal findings: Secondary | ICD-10-CM

## 2022-08-16 DIAGNOSIS — Z136 Encounter for screening for cardiovascular disorders: Secondary | ICD-10-CM

## 2022-08-16 DIAGNOSIS — Z87891 Personal history of nicotine dependence: Secondary | ICD-10-CM

## 2022-08-16 DIAGNOSIS — E559 Vitamin D deficiency, unspecified: Secondary | ICD-10-CM

## 2022-08-16 DIAGNOSIS — I10 Essential (primary) hypertension: Secondary | ICD-10-CM

## 2022-08-16 DIAGNOSIS — Z1211 Encounter for screening for malignant neoplasm of colon: Secondary | ICD-10-CM

## 2022-08-16 DIAGNOSIS — K219 Gastro-esophageal reflux disease without esophagitis: Secondary | ICD-10-CM

## 2022-08-16 DIAGNOSIS — R7309 Other abnormal glucose: Secondary | ICD-10-CM

## 2022-08-16 DIAGNOSIS — Z8249 Family history of ischemic heart disease and other diseases of the circulatory system: Secondary | ICD-10-CM

## 2022-08-16 MED ORDER — TOPIRAMATE 50 MG PO TABS: ORAL_TABLET | ORAL | 1 refills | Status: DC

## 2022-08-16 MED ORDER — PHENTERMINE HCL 37.5 MG PO TABS: ORAL_TABLET | ORAL | 1 refills | Status: DC

## 2022-08-16 NOTE — Patient Instructions (Signed)
Due to recent changes in healthcare laws, you may see the results of your imaging and laboratory studies on MyChart before your provider has had a chance to review them.  We understand that in some cases there may be results that are confusing or concerning to you. Not all laboratory results come back in the same time frame and the provider may be waiting for multiple results in order to interpret others.  Please give us 48 hours in order for your provider to thoroughly review all the results before contacting the office for clarification of your results.   +++++++++++++++++++++++++  Vit D  & Vit C 1,000 mg   are recommended to help protect  against the Covid-19 and other Corona viruses.    Also it's recommended  to take  Zinc 50 mg  to help  protect against the Covid-19   and [place] [facility] to get  is also on [employer]  and don't pay more than 6-8 cents /pill !  ================================ Coronavirus (COVID-19) Are you at risk?  Are you at risk for the Coronavirus (COVID-19)?  To be considered HIGH RISK for Coronavirus (COVID-19), you have to meet the following criteria:  Traveled to [place], [place], [place], [place] or [place]; or in the [place] to [place], [place], [place]  or [place]; and have fever, cough, and shortness of breath within the last 2 weeks of travel OR Been in close contact with a person diagnosed with COVID-19 within the last 2 weeks and have  fever, cough,and shortness of breath  IF YOU DO NOT MEET THESE CRITERIA, YOU ARE CONSIDERED LOW RISK FOR COVID-19.  What to do if you are HIGH RISK for COVID-19?  If you are having a medical emergency, call 911. Seek medical care right away. Before you go to a doctor's office, urgent care or emergency department,  call ahead and tell them about your recent travel, contact with someone diagnosed with COVID-19   and your symptoms.  You should receive instructions from your physician's office regarding next  steps of care.  When you arrive at healthcare provider, tell the healthcare staff immediately you have returned from  visiting [place], [place], [place], [place] or [place]; or traveled in the [place] to [place], [place],  [place] or [place] in the last two weeks or you have been in close contact with a person diagnosed with  COVID-19 in the last 2 weeks.   Tell the health care staff about your symptoms: fever, cough and shortness of breath. After you have been seen by a medical provider, you will be either: Tested for (COVID-19) and discharged home on quarantine except to seek medical care if  symptoms worsen, and asked to  Stay home and avoid contact with others until you get your results (4-5 days)  Avoid travel on public transportation if possible (such as bus, train, or airplane) or Sent to the Emergency Department by EMS for evaluation, COVID-19 testing  and  possible admission depending on your condition and test results.  What to do if you are LOW RISK for COVID-19?  Reduce your risk of any infection by using the same precautions used for avoiding the common cold or flu:  Wash your hands often with soap and warm water for at least 20 seconds.  If soap and water are not readily available,  use an alcohol-based hand sanitizer with at least 60% alcohol.  If coughing or sneezing, cover your mouth and nose by coughing   or sneezing into the elbow areas of your shirt or coat,  into a tissue or into your sleeve (not your hands). Avoid shaking hands with others and consider head nods or verbal greetings only. Avoid touching your eyes, nose, or mouth with unwashed hands.  Avoid close contact with people who are sick. Avoid places or events with large numbers of people in one location, like concerts or sporting events. Carefully consider travel plans you have or are making. If you are planning any travel outside or inside the [place], visit the CDC's Travelers' Health webpage for the  latest health notices. If you have some symptoms but not all symptoms, continue to monitor at home and seek medical attention  if your symptoms worsen. If you are having a medical emergency, call 911.   >>>>>>>>>>>>>>>>>>>>>>>>>>>>>>>>>  We Do NOT Approve of [employer] > > > > > > > > > > > > > > > > > > > > > > > > > > > > > > > > > > > > > > >  Preventive Care for Adults  A healthy lifestyle and preventive care can promote health and wellness. Preventive health guidelines for women include the following key practices. A routine yearly physical is a good way to check with your health care provider about your health and preventive screening. It is a chance to share any concerns and updates on your health and to receive a thorough exam. Visit your dentist for a routine exam and preventive care every 6 months. Brush your teeth twice a day and floss once a day. Good oral hygiene prevents tooth decay and gum disease. The frequency of eye exams is based on your age, health, family medical history, use of contact lenses, and other factors. Follow your health care provider's recommendations for frequency of eye exams. Eat a healthy diet. Foods like vegetables, fruits, whole grains, low-fat dairy products, and lean protein foods contain the nutrients you need without too many calories. Decrease your intake of foods high in solid fats, added sugars, and salt. Eat the right amount of calories for you. Get information about a proper diet from your health care provider, if necessary. Regular physical exercise is one of the most important things you can do for your health. Most adults should get at least 150 minutes of moderate-intensity exercise (any activity that increases your heart rate and causes you to sweat) each week. In addition, most adults need muscle-strengthening exercises on 2 or more days a week. Maintain a healthy weight. The body mass index (BMI) is a screening tool to identify  possible weight problems. It provides an estimate of body fat based on height and weight. Your health care provider can find your BMI and can help you achieve or maintain a healthy weight. For adults 20 years and older: A BMI below 18.5 is considered underweight. A BMI of 18.5 to 24.9 is normal. A BMI of 25 to 29.9 is considered overweight. A BMI of 30 and above is considered obese. Maintain normal blood lipids and cholesterol levels by exercising and minimizing your intake of saturated fat. Eat a balanced diet with plenty of fruit and vegetables. If your lipid or cholesterol levels are high, you are over 50, or you are at high risk for heart disease, you may need your cholesterol levels checked more frequently. Ongoing high lipid and cholesterol levels should be treated with medicines if diet and exercise are not working. If you smoke, find out from   your health care provider how to quit. If you do not use tobacco, do not start. Lung cancer screening is recommended for adults aged 55-80 years who are at high risk for developing lung cancer because of a history of smoking. A yearly low-dose CT scan of the lungs is recommended for people who have at least a 30-pack-year history of smoking and are a current smoker or have quit within the past 15 years. A pack year of smoking is smoking an average of 1 pack of cigarettes a day for 1 year (for example: 1 pack a day for 30 years or 2 packs a day for 15 years). Yearly screening should continue until the smoker has stopped smoking for at least 15 years. Yearly screening should be stopped for people who develop a health problem that would prevent them from having lung cancer treatment. Avoid use of street drugs. Do not share needles with anyone. Ask for help if you need support or instructions about stopping the use of drugs. High blood pressure causes heart disease and increases the risk of stroke.  Ongoing high blood pressure should be treated with medicines if  weight loss and exercise do not work. If you are 55-79 years old, ask your health care provider if you should take aspirin to prevent strokes. Diabetes screening involves taking a blood sample to check your fasting blood sugar level. This should be done once every 3 years, after age 45, if you are within normal weight and without risk factors for diabetes. Testing should be considered at a younger age or be carried out more frequently if you are overweight and have at least 1 risk factor for diabetes. Breast cancer screening is essential preventive care for women. You should practice "breast self-awareness." This means understanding the normal appearance and feel of your breasts and may include breast self-examination. Any changes detected, no matter how small, should be reported to a health care provider. Women in their 20s and 30s should have a clinical breast exam (CBE) by a health care provider as part of a regular health exam every 1 to 3 years. After age 40, women should have a CBE every year. Starting at age 40, women should consider having a mammogram (breast X-ray test) every year. Women who have a family history of breast cancer should talk to their health care provider about genetic screening. Women at a high risk of breast cancer should talk to their health care providers about having an MRI and a mammogram every year. Breast cancer gene (BRCA)-related cancer risk assessment is recommended for women who have family members with BRCA-related cancers. BRCA-related cancers include breast, ovarian, tubal, and peritoneal cancers. Having family members with these cancers may be associated with an increased risk for harmful changes (mutations) in the breast cancer genes BRCA1 and BRCA2. Results of the assessment will determine the need for genetic counseling and BRCA1 and BRCA2 testing. Routine pelvic exams to screen for cancer are no longer recommended for nonpregnant women who are considered low risk for  cancer of the pelvic organs (ovaries, uterus, and vagina) and who do not have symptoms. Ask your health care provider if a screening pelvic exam is right for you. If you have had past treatment for cervical cancer or a condition that could lead to cancer, you need Pap tests and screening for cancer for at least 20 years after your treatment. If Pap tests have been discontinued, your risk factors (such as having a new sexual partner) need to be   reassessed to determine if screening should be resumed. Some women have medical problems that increase the chance of getting cervical cancer. In these cases, your health care provider may recommend more frequent screening and Pap tests.  Colorectal cancer can be detected and often prevented. Most routine colorectal cancer screening begins at the age of 50 years and continues through age 75 years. However, your health care provider may recommend screening at an earlier age if you have risk factors for colon cancer. On a yearly basis, your health care provider may provide home test kits to check for hidden blood in the stool. Use of a small camera at the end of a tube, to directly examine the colon (sigmoidoscopy or colonoscopy), can detect the earliest forms of colorectal cancer. Talk to your health care provider about this at age 50, when routine screening begins.  Direct exam of the colon should be repeated every 5-10 years through age 75 years, unless early forms of pre-cancerous polyps or small growths are found. Osteoporosis is a disease in which the bones lose minerals and strength with aging. This can result in serious bone fractures or breaks. The risk of osteoporosis can be identified using a bone density scan. Women ages 65 years and over and women at risk for fractures or osteoporosis should discuss screening with their health care providers. Ask your health care provider whether you should take a calcium supplement or vitamin D to reduce the rate of  osteoporosis. Menopause can be associated with physical symptoms and risks. Hormone replacement therapy is available to decrease symptoms and risks. You should talk to your health care provider about whether hormone replacement therapy is right for you. Use sunscreen. Apply sunscreen liberally and repeatedly throughout the day. You should seek shade when your shadow is shorter than you. Protect yourself by wearing long sleeves, pants, a wide-brimmed hat, and sunglasses year round, whenever you are outdoors. Once a month, do a whole body skin exam, using a mirror to look at the skin on your back. Tell your health care provider of new moles, moles that have irregular borders, moles that are larger than a pencil eraser, or moles that have changed in shape or color. Stay current with required vaccines (immunizations). Influenza vaccine. All adults should be immunized every year. Tetanus, diphtheria, and acellular pertussis (Td, Tdap) vaccine. Pregnant women should receive 1 dose of Tdap vaccine during each pregnancy. The dose should be obtained regardless of the length of time since the last dose. Immunization is preferred during the 27th-36th week of gestation. An adult who has not previously received Tdap or who does not know her vaccine status should receive 1 dose of Tdap. This initial dose should be followed by tetanus and diphtheria toxoids (Td) booster doses every 10 years. Adults with an unknown or incomplete history of completing a 3-dose immunization series with Td-containing vaccines should begin or complete a primary immunization series including a Tdap dose. Adults should receive a Td booster every 10 years.  Zoster vaccine. One dose is recommended for adults aged 60 years or older unless certain conditions are present.  Pneumococcal 13-valent conjugate (PCV13) vaccine. When indicated, a person who is uncertain of her immunization history and has no record of immunization should receive the PCV13  vaccine. An adult aged 19 years or older who has certain medical conditions and has not been previously immunized should receive 1 dose of PCV13 vaccine. This PCV13 should be followed with a dose of pneumococcal polysaccharide (PPSV23) vaccine. The PPSV23   vaccine dose should be obtained at least 1 or more year(s) after the dose of PCV13 vaccine. An adult aged 19 years or older who has certain medical conditions and previously received 1 or more doses of PPSV23 vaccine should receive 1 dose of PCV13. The PCV13 vaccine dose should be obtained 1 or more years after the last PPSV23 vaccine dose.  Pneumococcal polysaccharide (PPSV23) vaccine. When PCV13 is also indicated, PCV13 should be obtained first. All adults aged 65 years and older should be immunized. An adult younger than age 65 years who has certain medical conditions should be immunized. Any person who resides in a nursing home or long-term care facility should be immunized. An adult smoker should be immunized. People with an immunocompromised condition and certain other conditions should receive both PCV13 and PPSV23 vaccines. People with human immunodeficiency virus (HIV) infection should be immunized as soon as possible after diagnosis. Immunization during chemotherapy or radiation therapy should be avoided. Routine use of PPSV23 vaccine is not recommended for American Indians, [place] Natives, or people younger than 65 years unless there are medical conditions that require PPSV23 vaccine. When indicated, people who have unknown immunization and have no record of immunization should receive PPSV23 vaccine. One-time revaccination 5 years after the first dose of PPSV23 is recommended for people aged 19-64 years who have chronic kidney failure, nephrotic syndrome, asplenia, or immunocompromised conditions. People who received 1-2 doses of PPSV23 before age 65 years should receive another dose of PPSV23 vaccine at age 65 years or later if at least 5 years have  passed since the previous dose. Doses of PPSV23 are not needed for people immunized with PPSV23 at or after age 65 years.  Preventive Services / Frequency  Ages 65 years and over Blood pressure check. Lipid and cholesterol check. Lung cancer screening. / Every year if you are aged 55-80 years and have a 30-pack-year history of smoking and currently smoke or have quit within the past 15 years. Yearly screening is stopped once you have quit smoking for at least 15 years or develop a health problem that would prevent you from having lung cancer treatment. Clinical breast exam.** / Every year after age 40 years.  BRCA-related cancer risk assessment.** / For women who have family members with a BRCA-related cancer (breast, ovarian, tubal, or peritoneal cancers). Mammogram.** / Every year beginning at age 40 years and continuing for as long as you are in good health. Consult with your health care provider. Pap test.** / Every 3 years starting at age 30 years through age 65 or 70 years with 3 consecutive normal Pap tests. Testing can be stopped between 65 and 70 years with 3 consecutive normal Pap tests and no abnormal Pap or HPV tests in the past 10 years. Fecal occult blood test (FOBT) of stool. / Every year beginning at age 50 years and continuing until age 75 years. You may not need to do this test if you get a colonoscopy every 10 years. Flexible sigmoidoscopy or colonoscopy.** / Every 5 years for a flexible sigmoidoscopy or every 10 years for a colonoscopy beginning at age 50 years and continuing until age 75 years. Hepatitis C blood test.** / For all people born from 1945 through 1965 and any individual with known risks for hepatitis C. Osteoporosis screening.** / A one-time screening for women ages 65 years and over and women at risk for fractures or osteoporosis. Skin self-exam. / Monthly. Influenza vaccine. / Every year. Tetanus, diphtheria, and acellular pertussis (Tdap/Td) vaccine.** /   1 dose  of Td every 10 years. Zoster vaccine.** / 1 dose for adults aged 60 years or older. Pneumococcal 13-valent conjugate (PCV13) vaccine.** / Consult your health care provider. Pneumococcal polysaccharide (PPSV23) vaccine.** / 1 dose for all adults aged 65 years and older. Screening for abdominal aortic aneurysm (AAA)  by ultrasound is recommended for people who have history of high blood pressure or who are current or former smokers. ++++++++++++++++++++ Recommend Adult Low Dose Aspirin or  coated  Aspirin 81 mg daily  To reduce risk of Colon Cancer 40 %,  Skin Cancer 26 % ,  Melanoma 46%  and  Pancreatic cancer 60% ++++++++++++++++++++ Vitamin D goal  is between 70-100.  Please make sure that you are taking your Vitamin D as directed.  It is very important as a natural anti-inflammatory  helping hair, skin, and nails, as well as reducing stroke and heart attack risk.  It helps your bones and helps with mood. It also decreases numerous cancer risks so please take it as directed.  Low Vit D is associated with a 200-300% higher risk for CANCER  and 200-300% higher risk for HEART   ATTACK  &  STROKE.   ...................................... It is also associated with higher death rate at younger ages,  autoimmune diseases like Rheumatoid arthritis, Lupus, Multiple Sclerosis.    Also many other serious conditions, like depression, Alzheimer's Dementia, infertility, muscle aches, fatigue, fibromyalgia - just to name a few. ++++++++++++++++++ Recommend the book "The END of DIETING" by Dr Joel Fuhrman  & the book "The END of DIABETES " by Dr Joel Fuhrman At [employer] - get book & Audio CD's    Being diabetic has a  300% increased risk for heart attack, stroke, cancer, and alzheimer- type vascular dementia. It is very important that you work harder with diet by avoiding all foods that are white. Avoid white rice (brown & wild rice is OK), white potatoes (sweetpotatoes in moderation is OK),  White bread or wheat bread or anything made out of white flour like bagels, donuts, rolls, buns, biscuits, cakes, pastries, cookies, pizza crust, and pasta (made from white flour & egg whites) - vegetarian pasta or spinach or wheat pasta is OK. Multigrain breads like Arnold's or [employer], or multigrain sandwich thins or flatbreads.  Diet, exercise and weight loss can reverse and cure diabetes in the early stages.  Diet, exercise and weight loss is very important in the control and prevention of complications of diabetes which affects every system in your body, ie. Brain - dementia/stroke, eyes - glaucoma/blindness, heart - heart attack/heart failure, kidneys - dialysis, stomach - gastric paralysis, intestines - malabsorption, nerves - severe painful neuritis, circulation - gangrene & loss of a leg(s), and finally cancer and Alzheimers.    I recommend avoid fried & greasy foods,  sweets/candy, white rice (brown or wild rice or Quinoa is OK), white potatoes (sweet potatoes are OK) - anything made from white flour - bagels, doughnuts, rolls, buns, biscuits,white and wheat breads, pizza crust and traditional pasta made of white flour & egg white(vegetarian pasta or spinach or wheat pasta is OK).  Multi-grain bread is OK - like multi-grain flat bread or sandwich thins. Avoid alcohol in excess. Exercise is also important.    Eat all the vegetables you want - avoid meat, especially red meat and dairy - especially cheese.  Cheese is the most concentrated form of trans-fats which is the worst thing to clog up our arteries. Veggie cheese is OK   which can be found in the fresh produce section at [employer] or [employer] or [employer]  +++++++++++++++++++ DASH Eating Plan  DASH stands for "Dietary Approaches to Stop Hypertension."   The DASH eating plan is a healthy eating plan that has been shown to reduce high blood pressure (hypertension). Additional health benefits may include reducing the risk of type 2  diabetes mellitus, heart disease, and stroke. The DASH eating plan may also help with weight loss. WHAT DO I NEED TO KNOW ABOUT THE DASH EATING PLAN? For the DASH eating plan, you will follow these general guidelines: Choose foods with a percent daily value for sodium of less than 5% (as listed on the food label). Use salt-free seasonings or herbs instead of table salt or sea salt. Check with your health care provider or pharmacist before using salt substitutes. Eat lower-sodium products, often labeled as "lower sodium" or "no salt added." Eat fresh foods. Eat more vegetables, fruits, and low-fat dairy products. Choose whole grains. Look for the word "whole" as the first word in the ingredient list. Choose fish  Limit sweets, desserts, sugars, and sugary drinks. Choose heart-healthy fats. Eat veggie cheese  Eat more home-cooked food and less restaurant, buffet, and fast food. Limit fried foods. Cook foods using methods other than frying. Limit canned vegetables. If you do use them, rinse them well to decrease the sodium. When eating at a restaurant, ask that your food be prepared with less salt, or no salt if possible.                      WHAT FOODS CAN I EAT? Read Dr Joel Fuhrman's books on The End of Dieting & The End of Diabetes  Grains Whole grain or whole wheat bread. Brown rice. Whole grain or whole wheat pasta. Quinoa, bulgur, and whole grain cereals. Low-sodium cereals. Corn or whole wheat flour tortillas. Whole grain cornbread. Whole grain crackers. Low-sodium crackers.  Vegetables Fresh or frozen vegetables (raw, steamed, roasted, or grilled). Low-sodium or reduced-sodium tomato and vegetable juices. Low-sodium or reduced-sodium tomato sauce and paste. Low-sodium or reduced-sodium canned vegetables.   Fruits All fresh, canned (in natural juice), or frozen fruits.  Protein Products  All fish and seafood.  Dried beans, peas, or lentils. Unsalted nuts and seeds. Unsalted  canned beans.  Dairy Low-fat dairy products, such as skim or 1% milk, 2% or reduced-fat cheeses, low-fat ricotta or cottage cheese, or plain low-fat yogurt. Low-sodium or reduced-sodium cheeses.  Fats and Oils Tub margarines without trans fats. Light or reduced-fat mayonnaise and salad dressings (reduced sodium). Avocado. Safflower, olive, or canola oils. Natural peanut or almond butter.  Other Unsalted popcorn and pretzels. The items listed above may not be a complete list of recommended foods or beverages. Contact your dietitian for more options.  +++++++++++++++  WHAT FOODS ARE NOT RECOMMENDED? Grains/ White flour or wheat flour White bread. White pasta. White rice. Refined cornbread. Bagels and croissants. Crackers that contain trans fat.  Vegetables  Creamed or fried vegetables. Vegetables in a . Regular canned vegetables. Regular canned tomato sauce and paste. Regular tomato and vegetable juices.  Fruits Dried fruits. Canned fruit in light or heavy syrup. Fruit juice.  Meat and Other Protein Products Meat in general - RED meat & White meat.  Fatty cuts of meat. Ribs, chicken wings, all processed meats as bacon, sausage, bologna, salami, fatback, hot dogs, bratwurst and packaged luncheon meats.  Dairy Whole or 2% milk, cream, half-and-half, and cream cheese.   Whole-fat or sweetened yogurt. Full-fat cheeses or blue cheese. Non-dairy creamers and whipped toppings. Processed cheese, cheese spreads, or cheese curds.  Condiments Onion and garlic salt, seasoned salt, table salt, and sea salt. Canned and packaged gravies. Worcestershire sauce. Tartar sauce. Barbecue sauce. Teriyaki sauce. Soy sauce, including reduced sodium. Steak sauce. Fish sauce. Oyster sauce. Cocktail sauce. Horseradish. Ketchup and mustard. Meat flavorings and tenderizers. Bouillon cubes. Hot sauce. Tabasco sauce. Marinades. Taco seasonings. Relishes.  Fats and Oils Butter, stick margarine, lard, shortening and  bacon fat. Coconut, palm kernel, or palm oils. Regular salad dressings.  Pickles and olives. Salted popcorn and pretzels.  The items listed above may not be a complete list of foods and beverages to avoid.   

## 2022-08-17 LAB — CBC WITH DIFFERENTIAL/PLATELET
Absolute Monocytes: 583 cells/uL (ref 200–950)
Basophils Absolute: 70 cells/uL (ref 0–200)
Basophils Relative: 0.8 %
Eosinophils Absolute: 191 cells/uL (ref 15–500)
Eosinophils Relative: 2.2 %
HCT: 43.5 % (ref 35.0–45.0)
Hemoglobin: 14.4 g/dL (ref 11.7–15.5)
Lymphs Abs: 2393 cells/uL (ref 850–3900)
MCH: 29.3 pg (ref 27.0–33.0)
MCHC: 33.1 g/dL (ref 32.0–36.0)
MCV: 88.4 fL (ref 80.0–100.0)
MPV: 11.5 fL (ref 7.5–12.5)
Monocytes Relative: 6.7 %
Neutro Abs: 5464 cells/uL (ref 1500–7800)
Neutrophils Relative %: 62.8 %
Platelets: 229 10*3/uL (ref 140–400)
RBC: 4.92 10*6/uL (ref 3.80–5.10)
RDW: 12.4 % (ref 11.0–15.0)
Total Lymphocyte: 27.5 %
WBC: 8.7 10*3/uL (ref 3.8–10.8)

## 2022-08-17 LAB — COMPLETE METABOLIC PANEL WITH GFR
AG Ratio: 2 (calc) (ref 1.0–2.5)
ALT: 15 U/L (ref 6–29)
AST: 13 U/L (ref 10–35)
Albumin: 4.1 g/dL (ref 3.6–5.1)
Alkaline phosphatase (APISO): 69 U/L (ref 37–153)
BUN/Creatinine Ratio: 31 (calc) — ABNORMAL HIGH (ref 6–22)
BUN: 30 mg/dL — ABNORMAL HIGH (ref 7–25)
CO2: 28 mmol/L (ref 20–32)
Calcium: 8.9 mg/dL (ref 8.6–10.4)
Chloride: 107 mmol/L (ref 98–110)
Creat: 0.97 mg/dL (ref 0.60–1.00)
Globulin: 2.1 g/dL (calc) (ref 1.9–3.7)
Glucose, Bld: 114 mg/dL — ABNORMAL HIGH (ref 65–99)
Potassium: 4 mmol/L (ref 3.5–5.3)
Sodium: 143 mmol/L (ref 135–146)
Total Bilirubin: 0.3 mg/dL (ref 0.2–1.2)
Total Protein: 6.2 g/dL (ref 6.1–8.1)
eGFR: 63 mL/min/{1.73_m2} (ref >=60)

## 2022-08-17 LAB — MAGNESIUM: Magnesium: 2 mg/dL (ref 1.5–2.5)

## 2022-08-17 LAB — MICROALBUMIN / CREATININE URINE RATIO
Creatinine, Urine: 138 mg/dL (ref 20–275)
Microalb Creat Ratio: 1 mcg/mg creat (ref <30)
Microalb, Ur: 0.2 mg/dL

## 2022-08-17 LAB — URINALYSIS, ROUTINE W REFLEX MICROSCOPIC
Bilirubin Urine: NEGATIVE
Glucose, UA: NEGATIVE
Hgb urine dipstick: NEGATIVE
Ketones, ur: NEGATIVE
Leukocytes,Ua: NEGATIVE
Nitrite: NEGATIVE
Protein, ur: NEGATIVE
Specific Gravity, Urine: 1.024 (ref 1.001–1.035)
pH: 5.5 (ref 5.0–8.0)

## 2022-08-17 LAB — HEMOGLOBIN A1C
Hgb A1c MFr Bld: 5.9 % of total Hgb — ABNORMAL HIGH (ref <5.7)
Mean Plasma Glucose: 123 mg/dL
eAG (mmol/L): 6.8 mmol/L

## 2022-08-17 LAB — INSULIN, RANDOM: Insulin: 57.8 u[IU]/mL — ABNORMAL HIGH

## 2022-08-17 LAB — LIPID PANEL
Cholesterol: 118 mg/dL (ref <200)
HDL: 56 mg/dL (ref >=50)
LDL Cholesterol (Calc): 39 mg/dL (calc)
Non-HDL Cholesterol (Calc): 62 mg/dL (calc) (ref <130)
Total CHOL/HDL Ratio: 2.1 (calc) (ref <5.0)
Triglycerides: 144 mg/dL (ref <150)

## 2022-08-17 LAB — VITAMIN D 25 HYDROXY (VIT D DEFICIENCY, FRACTURES): Vit D, 25-Hydroxy: 60 ng/mL (ref 30–100)

## 2022-08-17 LAB — TSH: TSH: 1.98 mIU/L (ref 0.40–4.50)

## 2022-08-17 NOTE — Progress Notes (Signed)
<><><><><><><><><><><><><><><><><><><><><><><><><><><><><><><><><> <><><><><><><><><><><><><><><><><><><><><><><><><><><><><><><><><> -   Test results slightly outside the reference range are not unusual. If there is anything important, I will review this with you,  otherwise it is considered normal test values.  If you have further questions,  please do not hesitate to contact me at the office or via My Chart.  <><><><><><><><><><><><><><><><><><><><><><><><><><><><><><><><><> <><><><><><><><><><><><><><><><><><><><><><><><><><><><><><><><><>  -  A1c = 5.9% is borderline elevated   - So    - Avoid Sweets, Candy & White Stuff   - White Rice, White Potatoes, White Flour  - Breads &  Pasta <><><><><><><><><><><><><><><><><><><><><><><><><><><><><><><><><> <><><><><><><><><><><><><><><><><><><><><><><><><><><><><><><><><>  -  Total Chol = 1178   &  LDL Chol = 39    -   Both  Excellent   - Very low risk for Heart Attack  / Stroke <><><><><><><><><><><><><><><><><><><><><><><><><><><><><><><><><> <><><><><><><><><><><><><><><><><><><><><><><><><><><><><><><><><>  -  Vitamin D = 60 - Please   continue dose same <><><><><><><><><><><><><><><><><><><><><><><><><><><><><><><><><> <><><><><><><><><><><><><><><><><><><><><><><><><><><><><><><><><>  -

## 2022-09-13 ENCOUNTER — Other Ambulatory Visit: Payer: Self-pay | Admitting: Nurse Practitioner

## 2022-09-13 DIAGNOSIS — H43821 Vitreomacular adhesion, right eye: Secondary | ICD-10-CM | POA: Diagnosis not present

## 2022-09-13 DIAGNOSIS — H35033 Hypertensive retinopathy, bilateral: Secondary | ICD-10-CM | POA: Diagnosis not present

## 2022-09-13 DIAGNOSIS — H43812 Vitreous degeneration, left eye: Secondary | ICD-10-CM | POA: Diagnosis not present

## 2022-09-13 DIAGNOSIS — H2513 Age-related nuclear cataract, bilateral: Secondary | ICD-10-CM | POA: Diagnosis not present

## 2022-09-13 DIAGNOSIS — G4700 Insomnia, unspecified: Secondary | ICD-10-CM

## 2022-09-13 DIAGNOSIS — H43392 Other vitreous opacities, left eye: Secondary | ICD-10-CM | POA: Diagnosis not present

## 2022-09-13 MED ORDER — ZOLPIDEM TARTRATE 5 MG PO TABS: ORAL_TABLET | ORAL | 0 refills | Status: DC

## 2022-09-29 ENCOUNTER — Other Ambulatory Visit: Payer: Self-pay | Admitting: Cardiovascular Disease

## 2022-10-08 DIAGNOSIS — M1712 Unilateral primary osteoarthritis, left knee: Secondary | ICD-10-CM | POA: Diagnosis not present

## 2022-10-11 ENCOUNTER — Ambulatory Visit: Payer: Medicare Other | Attending: Cardiovascular Disease | Admitting: Cardiovascular Disease

## 2022-10-11 ENCOUNTER — Encounter: Payer: Self-pay | Admitting: Cardiovascular Disease

## 2022-10-11 ENCOUNTER — Ambulatory Visit: Payer: Medicare Other | Admitting: Cardiovascular Disease

## 2022-10-11 VITALS — BP 156/72 | HR 70 | Ht 65.0 in | Wt 234.2 lb

## 2022-10-11 DIAGNOSIS — I10 Essential (primary) hypertension: Secondary | ICD-10-CM

## 2022-10-11 DIAGNOSIS — R7303 Prediabetes: Secondary | ICD-10-CM

## 2022-10-11 DIAGNOSIS — I25118 Atherosclerotic heart disease of native coronary artery with other forms of angina pectoris: Secondary | ICD-10-CM | POA: Diagnosis not present

## 2022-10-11 DIAGNOSIS — E7800 Pure hypercholesterolemia, unspecified: Secondary | ICD-10-CM

## 2022-10-11 MED ORDER — NITROGLYCERIN 0.4 MG SL SUBL: 0.4000 mg | SUBLINGUAL_TABLET | SUBLINGUAL | 1 refills | Status: AC | PRN

## 2022-10-11 NOTE — Patient Instructions (Signed)
Medication Instructions:  No changes *If you need a refill on your cardiac medications before your next appointment, please call your pharmacy*  Follow-Up: At [facility], you and your health needs are our priority.  As part of our continuing mission to provide you with exceptional heart care, we have created designated Provider Care Teams.  These Care Teams include your primary Cardiologist (physician) and Advanced Practice Providers (APPs -  Physician Assistants and Nurse Practitioners) who all work together to provide you with the care you need, when you need it.  We recommend signing up for the patient portal called "MyChart".  Sign up information is provided on this After Visit Summary.  MyChart is used to connect with patients for Virtual Visits (Telemedicine).  Patients are able to view lab/test results, encounter notes, upcoming appointments, etc.  Non-urgent messages can be sent to your provider as well.   To learn more about what you can do with MyChart, go to [email or web address].    Your next appointment:   1 year(s)  Provider:   Sanda Klein, MD     Other Instructions Please log your BP and hr for one week and send in log via MyChart

## 2022-10-11 NOTE — Progress Notes (Signed)
Cardiology Office Note    Date:  10/13/2022   ID:  Jade Singh, DOB 1951/07/14, MRN [id number]  PCP:  Unk Pinto, MD  Cardiologist:   Sanda Klein, MD   Chief Complaint  Patient presents with   Coronary Artery Disease    History of Present Illness:  Jade Singh is a 72 y.o. female with moderate CAD, hypertension, hyperlipidemia, obesity.    She has done well from a cardiovascular point of view.  She is using a cane since she has problems with her knees and feels that her balance is poor.  She has not had any actual falls.  She has not had any problems with nitroglycerin and has not taken any nitroglycerin in the last year.  Her blood pressure is a little high today, but at home is typically in the 130s.  She does not have orthopnea, PND, lower extremity edema, dyspnea with activity, palpitations, dizziness, syncope or focal neurological complaints.  She has not smoked cigarettes in 23 years, but it chest CT performed 04/06/2022 described "upper lobe predominant emphysema".  Again noticed was "heavy calcific atherosclerotic disease of the coronary arteries".  She had a lipid profile at the beginning of the year that showed all parameters in target range.  HDL was 56, LDL 39 and triglycerides 144.  Glycemic control is fair with a hemoglobin A1c of 5.9% without medications.  Creatinine is normal at 0.97.  She is currently lose weight and has received a prescription for phentermine.  She does not know whether she should take it.  In October 2020 she underwent a coronary CT angiogram.  She has a very high calcium score of 598 (96th percentile).  She had several moderate stenoses including 50% in the left main, 50-69% in the proximal LAD, 40-59% in the left circumflex.  However by fractional flow reserve the only significant stenosis was in the distal LAD (FFR 0.75).  Cardiac catheterization performed in October 2020 showed "minimal luminal irregularities, no obstructive  CAD".  Past Medical History:  Diagnosis Date   ANAL FISSURE, HX OF 11/06/2007   Qualifier: Diagnosis of  By: Julaine Hua CMA (AAMA), Amanda     Anxiety    Depression    Difficulty sleeping    takes Ambien   Diverticulosis 5/1/5   DJD (degenerative joint disease)    Family history of anesthesia complication    "mother quit breathing" 30 yrs ago pt does not know any more details. 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   Unspecified vitamin D deficiency    Urgency of urination     Past Surgical History:  Procedure Laterality Date   ABDOMINAL HYSTERECTOMY     partial   APPENDECTOMY     CARDIAC CATHETERIZATION  2005    no problems per pt    KNEE ARTHROSCOPY     RT 2011, LT 2009   LEFT HEART CATH AND CORONARY ANGIOGRAPHY N/A 05/11/2019   Procedure: LEFT HEART CATH AND CORONARY ANGIOGRAPHY;  Surgeon: Wellington Hampshire, MD;  Location: [facility];  Service: Cardiovascular;  Laterality: N/A;   TONSILLECTOMY AND ADENOIDECTOMY     TOTAL KNEE ARTHROPLASTY Right 02/28/2014   Procedure: RIGHT TOTAL KNEE ARTHROPLASTY;  Surgeon: Johnn Hai, MD;  Location: [facility];  Service: Orthopedics;  Laterality: Right;   TUBAL LIGATION      Current Medications: Outpatient Medications Prior to Visit  Medication Sig Dispense Refill   aspirin 81 MG tablet Take 81 mg by mouth daily.     Budeson-Glycopyrrol-Formoterol (BREZTRI AEROSPHERE) 160-9-4.8 MCG/ACT AERO Inhale 2 puffs into the lungs in the morning and at bedtime. 10.7 g 2   buPROPion (WELLBUTRIN XL) 150 MG 24 hr tablet TAKE 1 TABLET BY MOUTH EVERY MORNING 90 tablet 3   calcipotriene (DOVONOX) 0.005 % ointment Apply 1 application topically as needed (Hands).     cetirizine (ZYRTEC) 10 MG tablet Take 10 mg by mouth daily.     Cholecalciferol (VITAMIN D3) 5000  UNITS CAPS Take 5,000 Units by mouth 4 (four) times a week. Takes M,W,TH,F     ezetimibe (ZETIA) 10 MG tablet Take 1 tablet by mouth daily. Appointment needed-1st attempt 30 tablet 0   Magnesium 250 MG TABS Take 250 mg by mouth daily.     Multiple Vitamins-Minerals (MULTIVITAMIN WITH MINERALS) tablet Take 1 tablet by mouth daily.     pantoprazole (PROTONIX) 40 MG tablet TAKE 1 TABLET DAILY TO PREVENT INDIGESTION & HEARTBURN 90 tablet 3   rosuvastatin (CRESTOR) 20 MG tablet TAJE 1 TABLET BY MOUTH SUN TUES THURS SAT FOR CHOLESTEROL 48 tablet 3   triamterene-hydrochlorothiazide (MAXZIDE-25) 37.5-25 MG tablet TAKE 1 TABLET BY MOUTH AT BEDTIME. KEEP UPCOMING APPOINTMENT FOR FUTURE REFILLS. 90 tablet 2   zolpidem (AMBIEN) 5 MG tablet TAKE 1 TABLET BY MOUTH EVERY EVENING ONE HOUR BEFORE BEDTIME 30 tablet 0   albuterol (VENTOLIN HFA) 108 (90 Base) MCG/ACT inhaler Inhale 2 puffs into the lungs every 6 (six) hours as needed for wheezing or shortness of breath. (Patient not taking: Reported on 10/11/2022) 8 g 2   phentermine (ADIPEX-P) 37.5 MG tablet Take 1/2 to 1 tablet every Morning for Dieting & Weight Loss (Patient not taking: Reported on 10/11/2022) 90 tablet 1   topiramate (TOPAMAX) 50 MG tablet Take 1/2 to 1 tablet 2 x /day at Suppertime & Bedtime for Dieting & Weight Loss (Patient not taking: Reported on 10/11/2022) 180 tablet 1   nitroGLYCERIN (NITROSTAT) 0.4 MG SL tablet Place 1 tablet (0.4 mg total) under the tongue every 5 (five) minutes as needed for chest pain. (Patient not taking: Reported on 10/11/2022) 25 tablet 1   No facility-administered medications prior to visit.     Allergies:   Lexapro [escitalopram oxalate], Doxycycline, Levaquin [levofloxacin in d5w], and Oruvail [ketoprofen]   Social History   Socioeconomic History   Marital status: Married    Spouse name: Not on file   Number of children: Not on file   Years of education: Not on file   Highest education level: Not on file   Occupational History   Not on file  Tobacco Use   Smoking status: Former    Packs/day: 3.00    Years: 21.00    Additional pack years: 0.00    Total pack years: 63.00    Types: Cigarettes    Start date: 07/19/1969    Quit date: 07/19/1998    Years since quitting: 24.2   Smokeless tobacco: Never  Substance and Sexual Activity   Alcohol use: Yes    Comment: occ   Drug use: No   Sexual activity: Not on file  Other Topics Concern   Not on file  Social History Narrative   Not on file   Social Determinants of Health  Financial Resource Strain: Not on file  Food Insecurity: Not on file  Transportation Needs: Not on file  Physical Activity: Not on file  Stress: Not on file  Social Connections: Not on file     Family History:  The patient's family history includes Ataxia in her maternal aunt, maternal aunt, and mother; Cancer (age of onset: 8) in her brother; Diabetes in her paternal uncle; Heart attack in her maternal grandfather and mother; Heart disease in her paternal grandfather; Hyperlipidemia in her mother; Stroke in her mother.   ROS:   Please see the history of present illness.    ROS All other systems reviewed and are negative.   PHYSICAL EXAM:   VS:  BP (!) 156/72   Pulse 70   Ht 5\' 5"  (1.651 m)   Wt 234 lb 3.2 oz (106.2 kg)   SpO2 98%   BMI 38.97 kg/m      General: Alert, oriented x3, no distress, severely obese Head: no evidence of trauma, PERRL, EOMI, no exophtalmos or lid lag, no myxedema, no xanthelasma; normal ears, nose and oropharynx Neck: normal jugular venous pulsations and no hepatojugular reflux; brisk carotid pulses without delay and no carotid bruits Chest: clear to auscultation, no signs of consolidation by percussion or palpation, normal fremitus, symmetrical and full respiratory excursions Cardiovascular: normal position and quality of the apical impulse, regular rhythm, normal first and second heart sounds, no murmurs, rubs or gallops Abdomen:  no tenderness or distention, no masses by palpation, no abnormal pulsatility or arterial bruits, normal bowel sounds, no hepatosplenomegaly Extremities: no clubbing, cyanosis or edema; 2+ radial, ulnar and brachial pulses bilaterally; 2+ right femoral, posterior tibial and dorsalis pedis pulses; 2+ left femoral, posterior tibial and dorsalis pedis pulses; no subclavian or femoral bruits Neurological: grossly nonfocal Psych: Normal mood and affect    Wt Readings from Last 3 Encounters:  10/11/22 234 lb 3.2 oz (106.2 kg)  08/16/22 231 lb 12.8 oz (105.1 kg)  07/23/22 232 lb (105.2 kg)      Studies/Labs Reviewed:   EKG:  EKG is not ordered today.  Personally reviewed tracing from 08/16/2022 which shows normal sinus rhythm, normal tracing  Recent Labs: 08/16/2022: ALT 15; BUN 30; Creat 0.97; Hemoglobin 14.4; Magnesium 2.0; Platelets 229; Potassium 4.0; Sodium 143; TSH 1.98   Lipid Panel    Component Value Date/Time   CHOL 118 08/16/2022 1447   TRIG 144 08/16/2022 1447   HDL 56 08/16/2022 1447   CHOLHDL 2.1 08/16/2022 1447   VLDL 20 12/02/2016 1532   LDLCALC 39 08/16/2022 1447      ASSESSMENT:    1. Coronary artery disease involving native coronary artery of native heart with other form of angina pectoris ([place])   2. Essential hypertension   3. Hypercholesterolemia   4. Prediabetes   5. Severe obesity (BMI 35.0-39.9) with comorbidity ([place])       PLAN:  In order of problems listed above:  CAD: No recent problems with angina pectoris.  In the past her symptoms were related to extreme emotion.  It is possible that she has coronary vasospasm superimposed on fixed moderate CAD.  Conservative management with aggressive lipid-lowering, aspirin, nitroglycerin as needed. If she develops exertional symptoms can add amlodipine 2.5 mg daily or isosorbide mononitrate 30 mg daily. HTN: Usually well controlled.  Elevated systolic blood pressure today.  Monitor at home and let me know if it  remains elevated. HLP: Continue current, patient on rosuvastatin and ezetimibe on which lipid parameters are  excellent. GERD: Esophageal reflux and/or esophageal spasm could also explain her nonexertional pain; differential diagnosis with coronary vasospasm Obesity/prediabetes: Continue efforts to lose weight.  Not sure phentermine will make much of a difference, in the long run.  I think she would be a good candidate for GLP-1 agonists, if these were covered by insurance for weight loss only.  She does not have full-blown diabetes.     Medication Adjustments/Labs and Tests Ordered: Current medicines are reviewed at length with the patient today.  Concerns regarding medicines are outlined above.  Medication changes, Labs and Tests ordered today are listed in the Patient Instructions below. Patient Instructions  Medication Instructions:  No changes *If you need a refill on your cardiac medications before your next appointment, please call your pharmacy*  Follow-Up: At [facility], you and your health needs are our priority.  As part of our continuing mission to provide you with exceptional heart care, we have created designated Provider Care Teams.  These Care Teams include your primary Cardiologist (physician) and Advanced Practice Providers (APPs -  Physician Assistants and Nurse Practitioners) who all work together to provide you with the care you need, when you need it.  We recommend signing up for the patient portal called "MyChart".  Sign up information is provided on this After Visit Summary.  MyChart is used to connect with patients for Virtual Visits (Telemedicine).  Patients are able to view lab/test results, encounter notes, upcoming appointments, etc.  Non-urgent messages can be sent to your provider as well.   To learn more about what you can do with MyChart, go to [email or web address].    Your next appointment:   1 year(s)  Provider:   Sanda Klein, MD     Other  Instructions Please log your BP and hr for one week and send in log via [place], Sanda Klein, MD  10/13/2022 3:00 PM    [address], [place], [place]  [address] Phone: [phone]; Fax: [phone]

## 2022-10-13 ENCOUNTER — Encounter: Payer: Self-pay | Admitting: Cardiovascular Disease

## 2022-10-14 ENCOUNTER — Other Ambulatory Visit: Payer: Self-pay | Admitting: Nurse Practitioner

## 2022-10-14 DIAGNOSIS — G4700 Insomnia, unspecified: Secondary | ICD-10-CM

## 2022-10-18 ENCOUNTER — Encounter: Payer: Self-pay | Admitting: Nurse Practitioner

## 2022-10-18 ENCOUNTER — Other Ambulatory Visit: Payer: Self-pay | Admitting: Nurse Practitioner

## 2022-10-18 DIAGNOSIS — G4700 Insomnia, unspecified: Secondary | ICD-10-CM

## 2022-10-18 MED ORDER — HYDROXYZINE HCL 10 MG PO TABS
ORAL_TABLET | ORAL | 0 refills | Status: DC
Start: 2022-10-18 — End: 2022-11-18

## 2022-10-19 DIAGNOSIS — M1711 Unilateral primary osteoarthritis, right knee: Secondary | ICD-10-CM | POA: Diagnosis not present

## 2022-10-25 ENCOUNTER — Other Ambulatory Visit: Payer: Self-pay | Admitting: Cardiovascular Disease

## 2022-10-26 DIAGNOSIS — M1712 Unilateral primary osteoarthritis, left knee: Secondary | ICD-10-CM | POA: Diagnosis not present

## 2022-11-02 DIAGNOSIS — M25561 Pain in right knee: Secondary | ICD-10-CM | POA: Diagnosis not present

## 2022-11-02 DIAGNOSIS — M545 Low back pain, unspecified: Secondary | ICD-10-CM | POA: Diagnosis not present

## 2022-11-02 DIAGNOSIS — M1711 Unilateral primary osteoarthritis, right knee: Secondary | ICD-10-CM | POA: Diagnosis not present

## 2022-11-03 ENCOUNTER — Encounter: Payer: Self-pay | Admitting: Internal Medicine

## 2022-11-17 NOTE — Progress Notes (Signed)
MEDICARE ANNUAL WELLNESS VISIT AND FOLLOW UP  Assessment:   Annual Medicare Wellness Visit Due annually  Health maintenance reviewed  CAD with other angina (HCC) Dr. Royann Shivers follows; has nitroglycerine PRN, recently impred Control blood pressure, cholesterol (LDL <70), glucose, increase exercise.   Essential hypertension Continue medications Follows with Cardiology Monitor blood pressure at home; call if consistently over 130/80 Continue DASH diet.   Reminder to go to the ER if any CP, SOB, nausea, dizziness, severe HA, changes vision/speech, left arm numbness and tingling and jaw pain. Followed by cardiology as well  Irritable bowel syndrome, unspecified type Avoid triggers, add soluble fiber if not taking regularly  Gastroesophageal reflux disease, esophagitis presence not specified Well managed on current medications: pantropazole Discussed diet, avoiding triggers and other lifestyle changes  Diverticulosis Increase soluble fiber, UTD on colonoscopies Doing well today  Vitamin D deficiency Continue supplementation Check vitamin D level  Abnormal Glucose Discussed disease and risks Discussed diet/exercise, weight management  -A1C  Former Smoker  63 pack year history Has never had CT for screening CT chest w/o contrast is ordered  Hyperlipidemia Continue medications:Zetia10mg  and rosuvastatin 20mg  4 days/week Continue low cholesterol diet and exercise.  -Lipid panel, CMP  Recurrent major depressive disorder, in partial remission (HCC) Continue medications: Wellbutrin XL 150mg  daily- monitor symptoms and if feels she needs to increase to 300mg  of Wellbutrin please notify the office.  Is not wanting to increase dose currently Lifestyle discussed: diet/exerise, sleep hygiene, stress management, hydration  Anxiety state Well managed by current regimen;  uses benzo rarely Stress management techniques discussed, increase water, good sleep hygiene discussed,  increase exercise, and increase veggies.   Anemia, unspecified type -CBC  Morbid obesity, BMI 38 Long discussion about weight loss, diet, and exercise Recommended diet heavy in fruits and veggies and low in animal meats, cheeses, and dairy products, appropriate calorie intake Patient will work on restarting [employer], sister will help with accountability Exercise suggestions reviewed, planning into day, look into water aerobics Discussed appropriate weight for height  Follow up at next visit  Medication management Continued  Urine Abnormality - Routine UA with reflex microscopic - Microalbumin/creatinine urine ratio  Over 40 minutes of face to face exam, counseling, chart review and critical decision making was performed.  Future Appointments  Date Time Provider Department Center  02/22/2023  2:30 PM [name], MD [facility] None  05/25/2023  2:30 PM [name], NP [facility] None  08/25/2023 11:00 AM [name], MD [facility] None  11/22/2023  2:00 PM [name], NP [facility] None     Plan:   During the course of the visit the patient was educated and counseled about appropriate screening and preventive services including:   Pneumococcal vaccine  Prevnar 13 Influenza vaccine Td vaccine Screening electrocardiogram Bone densitometry screening Colorectal cancer screening Diabetes screening Glaucoma screening Nutrition counseling  Advanced directives: requested   Subjective:  Jade Singh is a 72 y.o. female who presents for Medicare Annual Wellness Visit and 3 month follow up. has Anxiety; Major depression in partial remission (HCC); IBS; Vitamin D deficiency; Hypertension; Hypercholesterolemia; GERD (gastroesophageal reflux disease); Morbid obesity (HCC) BMI 35+ with htn, hld, abn glucose; Other abnormal glucose; Diverticulosis; Atherosclerotic heart disease of native coronary artery with other forms of angina pectoris (HCC); Family  history of spinocerebellar ataxia type 6 (SCA6); and Other emphysema (HCC) on their problem list.  She had to put her cat to sleep last Monday and she is very emotional.   She  has noticed for about a week her urine has a foul odor.  Denies hematuria, dysuria , frequency or fever   She has a diagnosis of GERD which is currently managed by protonix 40 mg daily (failed transition to famotidine). Well controlled.   She has not been taking her Breztru regularly and has not had to use rescue inhaler  BMI is Body mass index is 38.61 kg/m., she has been working on diet and exercise, back on weight watchers and doing well, but got off of it, has regained 4 lb. Next goal is <200 lb, She drinks 65+ fluid ounces of water daily. She is trying to walk Wt Readings from Last 3 Encounters:  11/22/22 232 lb (105.2 kg)  10/11/22 234 lb 3.2 oz (106.2 kg)  08/16/22 231 lb 12.8 oz (105.1 kg)   October 2020 she underwent a coronary CT angiogram.  She has a very high calcium score of 598 (96th percentile).  She had several moderate stenoses including 50% in the left main, 50-69% in the proximal LAD, 40-59% in the left circumflex. Cardiac catheterization performed in October 2020 showed "minimal luminal irregularities, no obstructive CAD". She has had intermittent angina episodes associated with stress/anxiety that respond to nitroglycerine. Recently improved. Dr. Royann Shivers follows.   Her blood pressure has been controlled at home (130s/70s) on Maxzide 37.5/25 mg QD, Dr Royann Shivers is asking her to  monitor daily and then will decide if he wants to add another medication. today their BP is BP: (!) 150/86  BP Readings from Last 3 Encounters:  11/22/22 (!) 150/86  10/11/22 (!) 156/72  08/16/22 136/84  She does workout. She denies chest pain, shortness of breath, dizziness.    She is on cholesterol medication (rosuvastatin 20 mg MWFSat) and denies myalgias. Her cholesterol is at goal. The cholesterol last visit was:    Lab Results  Component Value Date   CHOL 118 08/16/2022   HDL 56 08/16/2022   LDLCALC 39 08/16/2022   TRIG 144 08/16/2022   CHOLHDL 2.1 08/16/2022    She has been working on diet and exercise for prediabetes, and denies increased appetite, nausea, paresthesia of the feet, polydipsia, polyuria and visual disturbances. Last A1C in the office was:  Lab Results  Component Value Date   HGBA1C 5.9 (H) 08/16/2022    Last GFR:  Lab Results  Component Value Date   EGFR 63 08/16/2022    Patient is on Vitamin D supplement.   Lab Results  Component Value Date   VD25OH 60 08/16/2022       Medication Review: Current Outpatient Medications on File Prior to Visit  Medication Sig Dispense Refill   albuterol (VENTOLIN HFA) 108 (90 Base) MCG/ACT inhaler Inhale 2 puffs into the lungs every 6 (six) hours as needed for wheezing or shortness of breath. 8 g 2   aspirin 81 MG tablet Take 81 mg by mouth daily.     Budeson-Glycopyrrol-Formoterol (BREZTRI AEROSPHERE) 160-9-4.8 MCG/ACT AERO Inhale 2 puffs into the lungs in the morning and at bedtime. 10.7 g 2   buPROPion (WELLBUTRIN XL) 150 MG 24 hr tablet TAKE 1 TABLET BY MOUTH EVERY MORNING 90 tablet 3   calcipotriene (DOVONOX) 0.005 % ointment Apply 1 application topically as needed (Hands).     Cholecalciferol (VITAMIN D3) 5000 UNITS CAPS Take 5,000 Units by mouth 4 (four) times a week. Takes M,W,TH,F     ezetimibe (ZETIA) 10 MG tablet TAKE 1 TABLET BY MOUTH DAILY 30 tablet 3   hydrOXYzine (ATARAX) 10  MG tablet TAKE 1 TO 3 TABLETS BY MOUTH AT BEDTIME AS NEEDED 90 tablet 0   Magnesium 250 MG TABS Take 250 mg by mouth daily.     Multiple Vitamins-Minerals (MULTIVITAMIN WITH MINERALS) tablet Take 1 tablet by mouth daily.     nitroGLYCERIN (NITROSTAT) 0.4 MG SL tablet Place 1 tablet (0.4 mg total) under the tongue every 5 (five) minutes as needed for chest pain. 25 tablet 1   pantoprazole (PROTONIX) 40 MG tablet TAKE 1 TABLET DAILY TO PREVENT  INDIGESTION & HEARTBURN 90 tablet 3   phentermine (ADIPEX-P) 37.5 MG tablet Take 1/2 to 1 tablet every Morning for Dieting & Weight Loss 90 tablet 1   rosuvastatin (CRESTOR) 20 MG tablet TAJE 1 TABLET BY MOUTH SUN TUES THURS SAT FOR CHOLESTEROL 48 tablet 3   topiramate (TOPAMAX) 50 MG tablet Take 1/2 to 1 tablet 2 x /day at Suppertime & Bedtime for Dieting & Weight Loss 180 tablet 1   triamterene-hydrochlorothiazide (MAXZIDE-25) 37.5-25 MG tablet TAKE 1 TABLET BY MOUTH AT BEDTIME. KEEP UPCOMING APPOINTMENT FOR FUTURE REFILLS. 90 tablet 2   No current facility-administered medications on file prior to visit.    Allergies  Allergen Reactions   Lexapro [Escitalopram Oxalate] Palpitations    Had wheezing and palpitations   Doxycycline Other (See Comments)    Jittery and causes nausea   Levaquin [Levofloxacin In D5w]     yeast   Oruvail [Ketoprofen] Nausea Only    GI upset    Current Problems (verified) Patient Active Problem List   Diagnosis Date Noted   Other emphysema (HCC) 04/08/2022   Family history of spinocerebellar ataxia type 6 (SCA6) 11/25/2021   Atherosclerotic heart disease of native coronary artery with other forms of angina pectoris (HCC) 10/29/2020   Diverticulosis    Other abnormal glucose 06/25/2014   Vitamin D deficiency    Hypertension    Hypercholesterolemia    GERD (gastroesophageal reflux disease)    Morbid obesity (HCC) BMI 35+ with htn, hld, abn glucose    Anxiety 11/06/2007   Major depression in partial remission (HCC) 11/06/2007   IBS 11/06/2007    Screening Tests Immunization History  Administered Date(s) Administered   Fluad Quad(high Dose 65+) 04/02/2019, 07/23/2022   Influenza Inj Mdck Quad With Preservative 05/17/2017   Influenza Split 06/25/2014, 05/08/2015   Influenza, High Dose Seasonal PF 05/09/2018, 04/09/2020   Influenza,inj,quad, With Preservative 04/13/2016   Influenza-Unspecified 05/28/2013, 07/07/2021   Moderna Covid-19 Vaccine  Bivalent Booster 83yrs & up 07/07/2021   Moderna Sars-Covid-2 Vaccination 08/24/2019, 09/22/2019, 05/13/2020   PPD Test 08/29/2013   Pneumococcal Conjugate-13 06/08/2017   Pneumococcal Polysaccharide-23 06/07/2013, 06/08/2018   Respiratory Syncytial Virus Vaccine,Recomb Aduvanted(Arexvy) 07/23/2022   Td 01/02/2020   Tdap 05/19/2009   Zoster, Live 06/25/2014   Health Maintenance  Topic Date Due   COVID-19 Vaccine (5 - 2023-24 season) 12/08/2022 (Originally 03/19/2022)   MAMMOGRAM  02/05/2023   INFLUENZA VACCINE  02/17/2023   COLONOSCOPY (Pts 45-24yrs Insurance coverage will need to be confirmed)  11/17/2023   Medicare Annual Wellness (AWV)  11/22/2023   DTaP/Tdap/Td (3 - Td or Tdap) 01/01/2030   Pneumonia Vaccine 35+ Years old  Completed   DEXA SCAN  Completed   Hepatitis C Screening  Completed   HPV VACCINES  Aged Out   Zoster Vaccines- Shingrix  Discontinued   Eye Doctor 06/2022 Dentist 08/2022  Patient Care Team: [name], MD as PCP - General (Internal Medicine) Thurmon Fair, MD as PCP - Cardiology (Cardiology)  Croitoru, Rachelle Hora, MD as Consulting Physician (Cardiology) Jene Every, MD as Consulting Physician (Orthopedic Surgery)  SURGICAL HISTORY She  has a past surgical history that includes Knee arthroscopy; Appendectomy; Tubal ligation; Abdominal hysterectomy; Tonsillectomy and adenoidectomy; Cardiac catheterization (2005); Total knee arthroplasty (Right, 02/28/2014); and LEFT HEART CATH AND CORONARY ANGIOGRAPHY (N/A, 05/11/2019). FAMILY HISTORY Her family history includes Ataxia in her maternal aunt, maternal aunt, and mother; Cancer (age of onset: 65) in her brother; Diabetes in her paternal uncle; Heart attack in her maternal grandfather and mother; Heart disease in her paternal grandfather; Hyperlipidemia in her mother; Stroke in her mother. SOCIAL HISTORY She  reports that she quit smoking about 24 years ago. Her smoking use included cigarettes. She started  smoking about 53 years ago. She has a 63.00 pack-year smoking history. She has never used smokeless tobacco. She reports current alcohol use. She reports that she does not use drugs.   MEDICARE WELLNESS OBJECTIVES: Physical activity: Current Exercise Habits: The patient does not participate in regular exercise at present, Exercise limited by: orthopedic condition(s) Cardiac risk factors: Cardiac Risk Factors include: advanced age (>13men, >88 women);dyslipidemia;hypertension;obesity (BMI >30kg/m2);sedentary lifestyle Depression/mood screen:      11/22/2022    2:40 PM  Depression screen PHQ 2/9  Decreased Interest 0  Down, Depressed, Hopeless 0  PHQ - 2 Score 0    ADLs:     11/22/2022    2:40 PM 08/16/2022   12:37 AM  In your present state of health, do you have any difficulty performing the following activities:  Hearing? 0 0  Vision? 0 0  Difficulty concentrating or making decisions? 0 0  Walking or climbing stairs? 1 0  Dressing or bathing? 0 0  Doing errands, shopping? 0 0     Cognitive Testing  Alert? Yes  Normal Appearance?Yes  Oriented to person? Yes  Place? Yes   Time? Yes  Recall of three objects?  Yes  Can perform simple calculations? Yes  Displays appropriate judgment?Yes  Can read the correct time from a watch face?Yes  EOL planning: Does Patient Have a Medical Advance Directive?: No Would patient like information on creating a medical advance directive?: No - Patient declined  Review of Systems  Constitutional:  Negative for malaise/fatigue and weight loss.  HENT:  Negative for hearing loss and tinnitus.   Eyes:  Negative for blurred vision and double vision.  Respiratory:  Negative for cough, sputum production, shortness of breath and wheezing.   Cardiovascular:  Negative for chest pain, palpitations, orthopnea, claudication, leg swelling and PND.  Gastrointestinal:  Negative for abdominal pain, blood in stool, constipation, diarrhea, heartburn, melena, nausea  and vomiting.  Genitourinary: Negative.   Musculoskeletal:  Positive for joint pain (bil knees, ortho follows). Negative for falls and myalgias.  Skin:  Negative for rash.  Neurological:  Negative for dizziness, tingling, sensory change, weakness and headaches.  Endo/Heme/Allergies:  Negative for polydipsia.  Psychiatric/Behavioral:  Positive for depression (a little worse). Negative for memory loss, substance abuse and suicidal ideas. The patient is not nervous/anxious and does not have insomnia (Controlled on ambien).   All other systems reviewed and are negative.    Objective:     Today's Vitals   11/22/22 1426  BP: (!) 150/86  Pulse: 80  Temp: 97.8 F (36.6 C)  SpO2: 98%  Weight: 232 lb (105.2 kg)  Height: 5\' 5"  (1.651 m)    Body mass index is 38.61 kg/m.  General appearance: Pleasant obese female in no distress HEENT: normocephalic,  sclerae anicteric, TMs pearly, nares patent, no discharge or erythema, pharynx normal Oral cavity: MMM, no lesions Neck: supple, no lymphadenopathy, no thyromegaly, no masses Heart: RRR, normal S1, S2, no murmurs Lungs: CTA bilaterally, no wheezes, rhonchi, or rales Abdomen: +bs, soft, obese abdomen limits exam, non tender, non distended, no palpable masses, hepatomegaly, splenomegaly Musculoskeletal: nontender, limited ROM of both knees Extremities: no edema, no cyanosis, no clubbing Pulses: 2+ symmetric, upper and lower extremities, normal cap refill Neurological: alert, oriented x 3, CN2-12 intact, strength normal upper extremities and lower extremities, sensation normal throughout, DTRs 2+ throughout, no cerebellar signs, gait slow steady Psychiatric: normal affect, behavior normal, pleasant   Medicare Attestation I have personally reviewed: The patient's medical and social history Their use of alcohol, tobacco or illicit drugs Their current medications and supplements The patient's functional ability including ADLs,fall risks, home  safety risks, cognitive, and hearing and visual impairment Diet and physical activities Evidence for depression or mood disorders  The patient's weight, height, BMI, and visual acuity have been recorded in the chart.  I have made referrals, counseling, and provided education to the patient based on review of the above and I have provided the patient with a written personalized care plan for preventive services.     [name], NP   11/22/2022

## 2022-11-18 ENCOUNTER — Other Ambulatory Visit: Payer: Self-pay | Admitting: Nurse Practitioner

## 2022-11-18 DIAGNOSIS — G4700 Insomnia, unspecified: Secondary | ICD-10-CM

## 2022-11-22 ENCOUNTER — Ambulatory Visit (INDEPENDENT_AMBULATORY_CARE_PROVIDER_SITE_OTHER): Payer: Medicare Other | Admitting: Nurse Practitioner

## 2022-11-22 ENCOUNTER — Encounter: Payer: Self-pay | Admitting: Nurse Practitioner

## 2022-11-22 VITALS — BP 150/86 | HR 80 | Temp 97.8°F | Ht 65.0 in | Wt 232.0 lb

## 2022-11-22 DIAGNOSIS — J432 Centrilobular emphysema: Secondary | ICD-10-CM

## 2022-11-22 DIAGNOSIS — R8290 Unspecified abnormal findings in urine: Secondary | ICD-10-CM | POA: Diagnosis not present

## 2022-11-22 DIAGNOSIS — Z79899 Other long term (current) drug therapy: Secondary | ICD-10-CM

## 2022-11-22 DIAGNOSIS — E782 Mixed hyperlipidemia: Secondary | ICD-10-CM | POA: Diagnosis not present

## 2022-11-22 DIAGNOSIS — G4700 Insomnia, unspecified: Secondary | ICD-10-CM | POA: Diagnosis not present

## 2022-11-22 DIAGNOSIS — Z0000 Encounter for general adult medical examination without abnormal findings: Secondary | ICD-10-CM

## 2022-11-22 DIAGNOSIS — I10 Essential (primary) hypertension: Secondary | ICD-10-CM

## 2022-11-22 DIAGNOSIS — E559 Vitamin D deficiency, unspecified: Secondary | ICD-10-CM | POA: Diagnosis not present

## 2022-11-22 DIAGNOSIS — R7309 Other abnormal glucose: Secondary | ICD-10-CM | POA: Diagnosis not present

## 2022-11-22 DIAGNOSIS — I25118 Atherosclerotic heart disease of native coronary artery with other forms of angina pectoris: Secondary | ICD-10-CM | POA: Diagnosis not present

## 2022-11-22 DIAGNOSIS — E66812 Obesity, class 2: Secondary | ICD-10-CM

## 2022-11-22 DIAGNOSIS — Z0001 Encounter for general adult medical examination with abnormal findings: Secondary | ICD-10-CM

## 2022-11-22 DIAGNOSIS — F419 Anxiety disorder, unspecified: Secondary | ICD-10-CM

## 2022-11-22 DIAGNOSIS — R6889 Other general symptoms and signs: Secondary | ICD-10-CM

## 2022-11-22 DIAGNOSIS — F3341 Major depressive disorder, recurrent, in partial remission: Secondary | ICD-10-CM

## 2022-11-22 DIAGNOSIS — Z6838 Body mass index (BMI) 38.0-38.9, adult: Secondary | ICD-10-CM

## 2022-11-22 NOTE — Patient Instructions (Signed)

## 2022-11-24 ENCOUNTER — Other Ambulatory Visit: Payer: Self-pay | Admitting: Nurse Practitioner

## 2022-11-24 DIAGNOSIS — N390 Urinary tract infection, site not specified: Secondary | ICD-10-CM

## 2022-11-24 LAB — CBC WITH DIFFERENTIAL/PLATELET
Absolute Monocytes: 583 cells/uL (ref 200–950)
Basophils Absolute: 85 cells/uL (ref 0–200)
Basophils Relative: 0.9 %
Eosinophils Absolute: 310 cells/uL (ref 15–500)
Eosinophils Relative: 3.3 %
HCT: 47 % — ABNORMAL HIGH (ref 35.0–45.0)
Hemoglobin: 15.1 g/dL (ref 11.7–15.5)
Lymphs Abs: 3215 cells/uL (ref 850–3900)
MCH: 28.1 pg (ref 27.0–33.0)
MCHC: 32.1 g/dL (ref 32.0–36.0)
MCV: 87.5 fL (ref 80.0–100.0)
MPV: 11.5 fL (ref 7.5–12.5)
Monocytes Relative: 6.2 %
Neutro Abs: 5208 cells/uL (ref 1500–7800)
Neutrophils Relative %: 55.4 %
Platelets: 234 10*3/uL (ref 140–400)
RBC: 5.37 10*6/uL — ABNORMAL HIGH (ref 3.80–5.10)
RDW: 12.6 % (ref 11.0–15.0)
Total Lymphocyte: 34.2 %
WBC: 9.4 10*3/uL (ref 3.8–10.8)

## 2022-11-24 LAB — COMPLETE METABOLIC PANEL WITH GFR
AG Ratio: 2 (calc) (ref 1.0–2.5)
ALT: 18 U/L (ref 6–29)
AST: 14 U/L (ref 10–35)
Albumin: 4.3 g/dL (ref 3.6–5.1)
Alkaline phosphatase (APISO): 68 U/L (ref 37–153)
BUN/Creatinine Ratio: 32 (calc) — ABNORMAL HIGH (ref 6–22)
BUN: 31 mg/dL — ABNORMAL HIGH (ref 7–25)
CO2: 27 mmol/L (ref 20–32)
Calcium: 9.2 mg/dL (ref 8.6–10.4)
Chloride: 105 mmol/L (ref 98–110)
Creat: 0.98 mg/dL (ref 0.60–1.00)
Globulin: 2.2 g/dL (calc) (ref 1.9–3.7)
Glucose, Bld: 89 mg/dL (ref 65–99)
Potassium: 4.3 mmol/L (ref 3.5–5.3)
Sodium: 140 mmol/L (ref 135–146)
Total Bilirubin: 0.4 mg/dL (ref 0.2–1.2)
Total Protein: 6.5 g/dL (ref 6.1–8.1)
eGFR: 62 mL/min/{1.73_m2} (ref >=60)

## 2022-11-24 LAB — URINALYSIS, ROUTINE W REFLEX MICROSCOPIC
Bacteria, UA: NONE SEEN /HPF
Bilirubin Urine: NEGATIVE
Glucose, UA: NEGATIVE
Hgb urine dipstick: NEGATIVE
Hyaline Cast: NONE SEEN /LPF
Ketones, ur: NEGATIVE
Nitrite: NEGATIVE
Protein, ur: NEGATIVE
RBC / HPF: NONE SEEN /HPF (ref 0–2)
Specific Gravity, Urine: 1.019 (ref 1.001–1.035)
pH: 6.5 (ref 5.0–8.0)

## 2022-11-24 LAB — URINE CULTURE
MICRO NUMBER:: 14918869
SPECIMEN QUALITY:: ADEQUATE

## 2022-11-24 LAB — MICROSCOPIC MESSAGE

## 2022-11-24 LAB — LIPID PANEL
Cholesterol: 123 mg/dL (ref <200)
HDL: 60 mg/dL (ref >=50)
LDL Cholesterol (Calc): 46 mg/dL (calc)
Non-HDL Cholesterol (Calc): 63 mg/dL (calc) (ref <130)
Total CHOL/HDL Ratio: 2.1 (calc) (ref <5.0)
Triglycerides: 85 mg/dL (ref <150)

## 2022-11-24 LAB — MAGNESIUM: Magnesium: 2.2 mg/dL (ref 1.5–2.5)

## 2022-11-24 MED ORDER — NITROFURANTOIN MONOHYD MACRO 100 MG PO CAPS
100.0000 mg | ORAL_CAPSULE | Freq: Two times a day (BID) | ORAL | 0 refills | Status: AC
Start: 2022-11-24 — End: 2022-12-01

## 2022-12-22 ENCOUNTER — Other Ambulatory Visit: Payer: Self-pay | Admitting: Internal Medicine

## 2022-12-22 DIAGNOSIS — Z1231 Encounter for screening mammogram for malignant neoplasm of breast: Secondary | ICD-10-CM

## 2023-01-10 ENCOUNTER — Other Ambulatory Visit: Payer: Self-pay

## 2023-01-10 DIAGNOSIS — E782 Mixed hyperlipidemia: Secondary | ICD-10-CM

## 2023-01-10 MED ORDER — ROSUVASTATIN CALCIUM 20 MG PO TABS
ORAL_TABLET | ORAL | 3 refills | Status: DC
Start: 2023-01-10 — End: 2023-12-14

## 2023-01-17 DIAGNOSIS — H35033 Hypertensive retinopathy, bilateral: Secondary | ICD-10-CM | POA: Diagnosis not present

## 2023-01-17 DIAGNOSIS — H43392 Other vitreous opacities, left eye: Secondary | ICD-10-CM | POA: Diagnosis not present

## 2023-01-17 DIAGNOSIS — H43812 Vitreous degeneration, left eye: Secondary | ICD-10-CM | POA: Diagnosis not present

## 2023-02-03 ENCOUNTER — Encounter: Payer: Self-pay | Admitting: Internal Medicine

## 2023-02-08 ENCOUNTER — Ambulatory Visit
Admission: RE | Admit: 2023-02-08 | Discharge: 2023-02-08 | Disposition: A | Discharge status: Home / Self Care | Payer: Medicare Other | Source: Ambulatory Visit | Attending: Internal Medicine | Admitting: Internal Medicine

## 2023-02-08 DIAGNOSIS — Z1231 Encounter for screening mammogram for malignant neoplasm of breast: Secondary | ICD-10-CM

## 2023-02-15 ENCOUNTER — Other Ambulatory Visit: Payer: Self-pay | Admitting: Nurse Practitioner

## 2023-02-15 ENCOUNTER — Ambulatory Visit: Payer: Medicare Other | Admitting: Nurse Practitioner

## 2023-02-15 DIAGNOSIS — G4700 Insomnia, unspecified: Secondary | ICD-10-CM

## 2023-02-21 ENCOUNTER — Other Ambulatory Visit: Payer: Self-pay

## 2023-02-21 MED ORDER — EZETIMIBE 10 MG PO TABS: ORAL_TABLET | ORAL | 7 refills | Status: DC

## 2023-02-21 NOTE — Progress Notes (Signed)
Future Appointments  Date Time Provider Department  02/22/2023                  6 mo ov   2:30 PM Jade Cowboy, MD [facility]  02/28/2023  9:45 AM [name], MD [facility]  05/25/2023                 9 mo ov   2:30 PM Jade Dick, NP [facility]  08/25/2023                  cpe 11:00 AM Jade Cowboy, MD [facility]  11/22/2023                   wellness  2:00 PM Jade Dick, NP [facility]    History of Present Illness:       This very nice 72 y.o.  MWF  presents for 6 month follow up with HTN, HLD, Pre-Diabetes and Vitamin D Deficiency. Patient has hx/o GERD controlled on her meds.        Today , patient becomes tearful relating approx 6 month prodrome of "balance issues "  and near falls occasionally using a cane. Her  particular concern is that her mother & 2 matermal aunts developed Spinocerebellar Degeneration later in life after ages 59 yo.        Patient is also treated for HTN  since  2011  & BP has been controlled at home. Today's BP is at goal - 136/78.   In 2020,   Heart Cath showed NICAD . Patient has had no complaints of any cardiac type chest pain, palpitations, dyspnea / orthopnea / PND, dizziness, claudication, or dependent edema.        Hyperlipidemia is controlled with diet & Rosuvastatin /Ezetimibe. Patient denies myalgias or other med SE's. Last Lipids were at goal :  Lab Results  Component Value Date   CHOL 123 11/22/2022   HDL 60 11/22/2022   LDLCALC 46 11/22/2022   TRIG 85 11/22/2022   CHOLHDL 2.1 11/22/2022     Also, the patient has Morbid Obesity  ( BMI 39+)  and co morbidities of HTN, HLD, and  PreDiabetes  (A1c 5.7%  /2015) .  Patient has had no symptoms of reactive hypoglycemia, diabetic polys, paresthesias or visual blurring.  Last A1c was near goal :  Lab Results  Component Value Date   HGBA1C 5.9 (H) 08/16/2022                                                            Further, the patient also has history of Vitamin  D Deficiency and supplements vitamin D . Last vitamin D was at goal :   Lab Results  Component Value Date   VD25OH 60 08/16/2022      Current Outpatient Medications on File Prior to Visit  Medication Sig   albuterol (VENTOLIN HFA) 108 (90 Base) MCG/ACT inhaler Inhale 2 puffs into the lungs every 6 (six) hours as needed for wheezing or shortness of breath.   aspirin 81 MG tablet Take 81 mg by mouth daily.   Budeson-Glycopyrrol-Formoterol (BREZTRI AEROSPHERE) 160-9-4.8 MCG/ACT AERO Inhale 2 puffs into the lungs in the morning and at bedtime.   buPROPion (WELLBUTRIN XL) 150 MG 24 hr tablet  TAKE 1 TABLET BY MOUTH EVERY MORNING   calcipotriene (DOVONOX) 0.005 % ointment Apply 1 application topically as needed (Hands).   Cholecalciferol (VITAMIN D3) 5000 UNITS CAPS Take 5,000 Units by mouth 4 (four) times a week. Takes M,W,TH,F   ezetimibe (ZETIA) 10 MG tablet TAKE 1 TABLET BY MOUTH DAILY   hydrOXYzine (ATARAX) 10 MG tablet TAKE 1-3 TABLETS BY MOUTH AT BEDTIME AS NEEDED   Magnesium 250 MG TABS Take 250 mg by mouth daily.   Multiple Vitamins-Minerals (MULTIVITAMIN WITH MINERALS) tablet Take 1 tablet by mouth daily.   nitroGLYCERIN (NITROSTAT) 0.4 MG SL tablet Place 1 tablet (0.4 mg total) under the tongue every 5 (five) minutes as needed for chest pain.   pantoprazole (PROTONIX) 40 MG tablet TAKE 1 TABLET DAILY TO PREVENT INDIGESTION & HEARTBURN   rosuvastatin (CRESTOR) 20 MG tablet TAJE 1 TABLET BY MOUTH SUN TUES THURS SAT FOR CHOLESTEROL   triamterene-hydrochlorothiazide (MAXZIDE-25) 37.5-25 MG tablet TAKE 1 TABLET BY MOUTH AT BEDTIME. KEEP UPCOMING APPOINTMENT FOR FUTURE REFILLS.   No current facility-administered medications on file prior to visit.      Allergies  Allergen Reactions   Lexapro [Escitalopram Oxalate] Palpitations    Had wheezing and palpitations   Doxycycline Other (See Comments)    Jittery and causes nausea   Levaquin [Levofloxacin In D5w]     yeast   Oruvail  [Ketoprofen] Nausea Only    GI upset      PMHx:   Past Medical History:  Diagnosis Date   ANAL FISSURE, HX OF 11/06/2007   Qualifier: Diagnosis of  By: Misty Stanley CMA (AAMA), Amanda     Anxiety    Depression    Difficulty sleeping    takes Ambien   Diverticulosis 5/1/5   DJD (degenerative joint disease)    Family history of anesthesia complication    "mother quit breathing" 30 yrs ago pt does not know any more details. 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   Unspecified vitamin D deficiency    Urgency of urination      Immunization History  Administered Date(s) Administered   Fluad Quad(high Dose 65+) 04/02/2019, 07/23/2022   Influenza Inj Mdck Quad With Preservative 05/17/2017   Influenza Split 06/25/2014, 05/08/2015   Influenza, High Dose Seasonal PF 10/Jade/2019, 09/Jade/2021   Influenza,inj,quad, With Preservative 04/13/2016   Influenza-Unspecified 05/28/2013, 07/07/2021   Moderna Covid-19 Vaccine Bivalent Booster 65yrs & up 07/07/2021   Moderna Sars-Covid-2 Vaccination 08/24/2019, 09/22/2019, 05/13/2020   PPD Test 08/29/2013   Pneumococcal Conjugate-13 06/08/2017   Pneumococcal Polysaccharide-23 06/07/2013, 06/08/2018   Respiratory Syncytial Virus Vaccine,Recomb Aduvanted(Arexvy) 07/23/2022   Td 01/02/2020   Tdap 05/19/2009   Zoster, Live 06/25/2014     Past Surgical History:  Procedure Laterality Date   ABDOMINAL HYSTERECTOMY     partial   APPENDECTOMY     CARDIAC CATHETERIZATION  2005    no problems per pt    KNEE ARTHROSCOPY     RT 2011, LT 2009   LEFT HEART CATH AND CORONARY ANGIOGRAPHY N/A 05/11/2019   Procedure: LEFT HEART CATH AND CORONARY ANGIOGRAPHY;  Surgeon: Iran Ouch, MD;  Location: [facility];  Service: Cardiovascular;  Laterality: N/A;    TONSILLECTOMY AND ADENOIDECTOMY  TOTAL KNEE ARTHROPLASTY Right 02/28/2014   Procedure: RIGHT TOTAL KNEE ARTHROPLASTY;  Surgeon: Javier Docker, MD;  Location: [facility];  Service: Orthopedics;  Laterality: Right;   TUBAL LIGATION       FHx:    Reviewed / unchanged   SHx:    Reviewed / unchanged    Systems Review:  Constitutional: Denies fever, chills, wt changes, headaches, insomnia, fatigue, night sweats, change in appetite. Eyes: Denies redness, blurred vision, diplopia, discharge, itchy, watery eyes.  ENT: Denies discharge, congestion, post nasal drip, epistaxis, sore throat, earache, hearing loss, dental pain, tinnitus, vertigo, sinus pain, snoring.  CV: Denies chest pain, palpitations, irregular heartbeat, syncope, dyspnea, diaphoresis, orthopnea, PND, claudication or edema. Respiratory: denies cough, dyspnea, DOE, pleurisy, hoarseness, laryngitis, wheezing.  Gastrointestinal: Denies dysphagia, odynophagia, heartburn, reflux, water brash, abdominal pain or cramps, nausea, vomiting, bloating, diarrhea, constipation, hematemesis, melena, hematochezia  or hemorrhoids. Genitourinary: Denies dysuria, frequency, urgency, nocturia, hesitancy, discharge, hematuria or flank pain. Musculoskeletal: Denies arthralgias, myalgias, stiffness, jt. swelling, pain, limping or strain/sprain.  Skin: Denies pruritus, rash, hives, warts, acne, eczema or change in skin lesion(s). Neuro: No weakness, tremor, incoordination, spasms, paresthesia or pain. Psychiatric: Denies confusion, memory loss or sensory loss. Endo: Denies change in weight, skin or hair change.  Heme/Lymph: No excessive bleeding, bruising or enlarged lymph nodes.   Physical Exam  BP 136/78   Pulse (!) 59   Temp 97.9 F (36.6 C)   Resp 16   Ht 5\' 5"  (1.651 m)   Wt 237 lb 3.2 oz (107.6 kg)   SpO2 97%   BMI 39.47 kg/m   Appears  well nourished, well groomed  and in no distress.  Eyes: PERRLA, EOMs, conjunctiva no swelling or  erythema. Sinuses: No frontal/maxillary tenderness ENT/Mouth: EAC's clear, TM's nl w/o erythema, bulging. Nares clear w/o erythema, swelling, exudates. Oropharynx clear without erythema or exudates. Oral hygiene is good. Tongue normal, non obstructing. Hearing intact.  Neck: Supple. Thyroid not palpable. Car 2+/2+ without bruits, nodes or JVD. Chest: Respirations nl with BS clear & equal w/o rales, rhonchi, wheezing or stridor.  Cor: Heart sounds normal w/ regular rate and rhythm without sig. murmurs, gallops, clicks or rubs. Peripheral pulses normal and equal  without edema.  Abdomen: Soft & bowel sounds normal. Non-tender w/o guarding, rebound, hernias, masses or organomegaly.  Lymphatics: Unremarkable.  Musculoskeletal: Full ROM all peripheral extremities, joint stability, 5/5 strength and normal gait.  Skin: Warm, dry without exposed rashes, lesions or ecchymosis apparent.  Neuro: Cranial nerves intact. Sensory-motor testing grossly intact. Tendon reflexes flat. Equivocal Gower's. Negative Babinski. Nl   F->N.  Pysch: Alert & oriented x 3.  Insight and judgement nl & appropriate. No ideations.   Assessment and Plan:   1. Essential hypertension  - Continue medication, monitor blood pressure at home.  - Continue DASH diet.  Reminder to go to the ER if any CP,  SOB, nausea, dizziness, severe HA, changes vision/speech.    - CBC with Differential/Platelet - COMPLETE METABOLIC PANEL WITH GFR - Magnesium - TSH   2. Class 2 severe obesity due to excess calories with serious comorbidity                                         and body mass index (BMI) of 38.0 to 38.9 in adult (HCC)  - COMPLETE METABOLIC PANEL WITH GFR - Lipid panel - TSH -  Hemoglobin A1c - Insulin, random   3. Hyperlipidemia, mixed  - Continue diet/meds, exercise,& lifestyle modifications.  - Continue monitor periodic cholesterol/liver & renal functions       - Lipid panel - TSH  4. Abnormal glucose  -  Continue diet, exercise  - Lifestyle modifications.  - Monitor appropriate labs    - Hemoglobin A1c - Insulin, random   5. Vitamin D deficiency  - Continue supplementation     - VITAMIN D 25 Hydroxy   6. Arteriosclerotic cardiovascular disease (ASCVD)  - Lipid panel   7. Family history of cerebellar ataxia  - Ambulatory referral to Neurology   8. Poor balance  - Ambulatory referral to Neurology   9. Medication management  - CBC with Differential/Platelet - COMPLETE METABOLIC PANEL WITH GFR - Magnesium - Lipid panel - TSH - Hemoglobin A1c - Insulin, random - VITAMIN D 25 Hydroxy          Discussed  regular exercise, BP monitoring, weight control to achieve/maintain BMI less than 25 and discussed med and SE's. Recommended labs to assess /monitor clinical status .  I discussed the assessment and treatment plan with the patient. The patient was provided an opportunity to ask questions and all were answered. The patient agreed with the plan and demonstrated an understanding of the instructions.  I provided over 30 minutes of exam, counseling, chart review and  complex critical decision making.        The patient was advised to call back or seek an in-person evaluation if the symptoms worsen or if the condition fails to improve as anticipated.   Marinus Maw, MD

## 2023-02-21 NOTE — Progress Notes (Incomplete)
Future Appointments  Date Time Provider Department  02/22/2023                  6 mo ov   2:30 PM [name], MD [facility]  02/28/2023  9:45 AM [name], MD [facility]  05/25/2023                 9 mo ov   2:30 PM [name], NP [facility]  08/25/2023                  cpe 11:00 AM [name], MD [facility]  11/22/2023                   wellness  2:00 PM [name], NP [facility]    History of Present Illness:       This very nice 72 y.o.  MW presents for 6 month follow up with HTN, HLD, Pre-Diabetes and Vitamin D Deficiency.         Patient is treated for HTN  since  & BP has been controlled at home. Today's  . Patient has had no complaints of any cardiac type chest pain, palpitations, dyspnea / orthopnea / PND, dizziness, claudication, or dependent edema.        Hyperlipidemia is controlled with diet & meds. Patient denies myalgias or other med SE's. Last Lipids were  Lab Results  Component Value Date   CHOL 123 11/22/2022   HDL 60 11/22/2022   LDLCALC 46 11/22/2022   TRIG 85 11/22/2022   CHOLHDL 2.1 11/22/2022      Also, the patient has history of T2_NIDDM PreDiabetes and has had no symptoms of reactive hypoglycemia, diabetic polys, paresthesias or visual blurring.  Last A1c was   Lab Results  Component Value Date   HGBA1C 5.9 (H) 08/16/2022         Further, the patient also has history of Vitamin D Deficiency and supplements vitamin D . Last vitamin D was  Lab Results  Component Value Date   VD25OH 60 08/16/2022      Current Outpatient Medications on File Prior to Visit  Medication Sig  . albuterol (VENTOLIN HFA) 108 (90 Base) MCG/ACT inhaler Inhale 2 puffs into the lungs every 6 (six) hours as needed for wheezing or shortness of breath.  Marland Kitchen aspirin 81 MG tablet Take 81 mg by mouth daily.  . Budeson-Glycopyrrol-Formoterol (BREZTRI AEROSPHERE) 160-9-4.8 MCG/ACT AERO Inhale 2 puffs into the lungs in the morning and at  bedtime.  Marland Kitchen buPROPion (WELLBUTRIN XL) 150 MG 24 hr tablet TAKE 1 TABLET BY MOUTH EVERY MORNING  . calcipotriene (DOVONOX) 0.005 % ointment Apply 1 application topically as needed (Hands).  . Cholecalciferol (VITAMIN D3) 5000 UNITS CAPS Take 5,000 Units by mouth 4 (four) times a week. Takes M,W,TH,F  . ezetimibe (ZETIA) 10 MG tablet TAKE 1 TABLET BY MOUTH DAILY  . hydrOXYzine (ATARAX) 10 MG tablet TAKE 1-3 TABLETS BY MOUTH AT BEDTIME AS NEEDED  . Magnesium 250 MG TABS Take 250 mg by mouth daily.  . Multiple Vitamins-Minerals (MULTIVITAMIN WITH MINERALS) tablet Take 1 tablet by mouth daily.  . nitroGLYCERIN (NITROSTAT) 0.4 MG SL tablet Place 1 tablet (0.4 mg total) under the tongue every 5 (five) minutes as needed for chest pain.  . pantoprazole (PROTONIX) 40 MG tablet TAKE 1 TABLET DAILY TO PREVENT INDIGESTION & HEARTBURN  . rosuvastatin (CRESTOR) 20 MG tablet TAJE 1 TABLET BY MOUTH SUN TUES THURS SAT FOR CHOLESTEROL  .  triamterene-hydrochlorothiazide (MAXZIDE-25) 37.5-25 MG tablet TAKE 1 TABLET BY MOUTH AT BEDTIME. KEEP UPCOMING APPOINTMENT FOR FUTURE REFILLS.   No current facility-administered medications on file prior to visit.      Allergies  Allergen Reactions  . Lexapro [Escitalopram Oxalate] Palpitations    Had wheezing and palpitations  . Doxycycline Other (See Comments)    Jittery and causes nausea  . Levaquin [Levofloxacin In D5w]     yeast  . Oruvail [Ketoprofen] Nausea Only    GI upset      PMHx:   Past Medical History:  Diagnosis Date  . ANAL FISSURE, HX OF 11/06/2007   Qualifier: Diagnosis of  By: Misty Stanley CMA (AAMA), Marchelle Folks    . Anxiety   . Depression   . Difficulty sleeping    takes Ambien  . Diverticulosis 5/1/5  . DJD (degenerative joint disease)   . Family history of anesthesia complication    "mother quit breathing" 30 yrs ago pt does not know any more details. Pt has never had any problems with anesthesia herself  . GERD (gastroesophageal reflux  disease)   . Hyperlipidemia   . Hypertension   . Obese   . Peripheral edema   . Psoriasis    HANDS  . Psoriasis   . 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 Unspecified vitamin D deficiency   . Urgency of urination       Immunization History  Administered Date(s) Administered  . Fluad Quad(high Dose 65+) 04/02/2019, 07/23/2022  . Influenza Inj Mdck Quad With Preservative 05/17/2017  . Influenza Split 06/25/2014, 05/08/2015  . Influenza, High Dose Seasonal PF 05/09/2018, 04/09/2020  . Influenza,inj,quad, With Preservative 04/13/2016  . Influenza-Unspecified 05/28/2013, 07/07/2021  . Moderna Covid-19 Vaccine Bivalent Booster 45yrs & up 07/07/2021  . Moderna Sars-Covid-2 Vaccination 08/24/2019, 09/22/2019, 05/13/2020  . PPD Test 08/29/2013  . Pneumococcal Conjugate-13 06/08/2017  . Pneumococcal Polysaccharide-23 06/07/2013, 06/08/2018  . Respiratory Syncytial Virus Vaccine,Recomb Aduvanted(Arexvy) 07/23/2022  . Td 01/02/2020  . Tdap 05/19/2009  . Zoster, Live 06/25/2014      Past Surgical History:  Procedure Laterality Date  . ABDOMINAL HYSTERECTOMY     partial  . APPENDECTOMY    . CARDIAC CATHETERIZATION  2005    no problems per pt   . KNEE ARTHROSCOPY     RT 2011, LT 2009  . LEFT HEART CATH AND CORONARY ANGIOGRAPHY N/A 05/11/2019   Procedure: LEFT HEART CATH AND CORONARY ANGIOGRAPHY;  Surgeon: Iran Ouch, MD;  Location: [facility];  Service: Cardiovascular;  Laterality: N/A;  . TONSILLECTOMY AND ADENOIDECTOMY    . TOTAL KNEE ARTHROPLASTY Right 02/28/2014   Procedure: RIGHT TOTAL KNEE ARTHROPLASTY;  Surgeon: Javier Docker, MD;  Location: [facility];  Service: Orthopedics;  Laterality: Right;  . TUBAL LIGATION       FHx:    Reviewed / unchanged   SHx:    Reviewed / unchanged    Systems Review:  Constitutional: Denies fever, chills, wt changes, headaches, insomnia, fatigue, night sweats, change in appetite. Eyes: Denies redness, blurred vision, diplopia,  discharge, itchy, watery eyes.  ENT: Denies discharge, congestion, post nasal drip, epistaxis, sore throat, earache, hearing loss, dental pain, tinnitus, vertigo, sinus pain, snoring.  CV: Denies chest pain, palpitations, irregular heartbeat, syncope, dyspnea, diaphoresis, orthopnea, PND, claudication or edema. Respiratory: denies cough, dyspnea, DOE, pleurisy, hoarseness, laryngitis, wheezing.  Gastrointestinal: Denies dysphagia, odynophagia, heartburn, reflux, water brash, abdominal pain or cramps, nausea, vomiting, bloating, diarrhea, constipation, hematemesis, melena, hematochezia  or hemorrhoids. Genitourinary: Denies  dysuria, frequency, urgency, nocturia, hesitancy, discharge, hematuria or flank pain. Musculoskeletal: Denies arthralgias, myalgias, stiffness, jt. swelling, pain, limping or strain/sprain.  Skin: Denies pruritus, rash, hives, warts, acne, eczema or change in skin lesion(s). Neuro: No weakness, tremor, incoordination, spasms, paresthesia or pain. Psychiatric: Denies confusion, memory loss or sensory loss. Endo: Denies change in weight, skin or hair change.  Heme/Lymph: No excessive bleeding, bruising or enlarged lymph nodes.   Physical Exam  There were no vitals taken for this visit.  Appears  well nourished, well groomed  and in no distress.  Eyes: PERRLA, EOMs, conjunctiva no swelling or erythema. Sinuses: No frontal/maxillary tenderness ENT/Mouth: EAC's clear, TM's nl w/o erythema, bulging. Nares clear w/o erythema, swelling, exudates. Oropharynx clear without erythema or exudates. Oral hygiene is good. Tongue normal, non obstructing. Hearing intact.  Neck: Supple. Thyroid not palpable. Car 2+/2+ without bruits, nodes or JVD. Chest: Respirations nl with BS clear & equal w/o rales, rhonchi, wheezing or stridor.  Cor: Heart sounds normal w/ regular rate and rhythm without sig. murmurs, gallops, clicks or rubs. Peripheral pulses normal and equal  without edema.  Abdomen:  Soft & bowel sounds normal. Non-tender w/o guarding, rebound, hernias, masses or organomegaly.  Lymphatics: Unremarkable.  Musculoskeletal: Full ROM all peripheral extremities, joint stability, 5/5 strength and normal gait.  Skin: Warm, dry without exposed rashes, lesions or ecchymosis apparent.  Neuro: Cranial nerves intact, reflexes equal bilaterally. Sensory-motor testing grossly intact. Tendon reflexes grossly intact.  Pysch: Alert & oriented x 3.  Insight and judgement nl & appropriate. No ideations.   Assessment and Plan:  - Continue medication, monitor blood pressure at home.  - Continue DASH diet.  Reminder to go to the ER if any CP,  SOB, nausea, dizziness, severe HA, changes vision/speech.  - Continue diet/meds, exercise,& lifestyle modifications.  - Continue monitor periodic cholesterol/liver & renal functions    - Continue diet, exercise  - Lifestyle modifications.  - Monitor appropriate labs. - Continue supplementation.        Discussed  regular exercise, BP monitoring, weight control to achieve/maintain BMI less than 25 and discussed med and SE's. Recommended labs to assess /monitor clinical status .  I discussed the assessment and treatment plan with the patient. The patient was provided an opportunity to ask questions and all were answered. The patient agreed with the plan and demonstrated an understanding of the instructions.  I provided over 30 minutes of exam, counseling, chart review and  complex critical decision making.        The patient was advised to call back or seek an in-person evaluation if the symptoms worsen or if the condition fails to improve as anticipated.   Marinus Maw, MD

## 2023-02-22 ENCOUNTER — Encounter: Payer: Self-pay | Admitting: Internal Medicine

## 2023-02-22 ENCOUNTER — Ambulatory Visit (INDEPENDENT_AMBULATORY_CARE_PROVIDER_SITE_OTHER): Payer: Medicare Other | Admitting: Internal Medicine

## 2023-02-22 VITALS — BP 136/78 | HR 59 | Temp 97.9°F | Resp 16 | Ht 65.0 in | Wt 237.2 lb

## 2023-02-22 DIAGNOSIS — I2510 Atherosclerotic heart disease of native coronary artery without angina pectoris: Secondary | ICD-10-CM | POA: Diagnosis not present

## 2023-02-22 DIAGNOSIS — E66812 Obesity, class 2: Secondary | ICD-10-CM

## 2023-02-22 DIAGNOSIS — R2689 Other abnormalities of gait and mobility: Secondary | ICD-10-CM

## 2023-02-22 DIAGNOSIS — I10 Essential (primary) hypertension: Secondary | ICD-10-CM

## 2023-02-22 DIAGNOSIS — R7309 Other abnormal glucose: Secondary | ICD-10-CM

## 2023-02-22 DIAGNOSIS — Z79899 Other long term (current) drug therapy: Secondary | ICD-10-CM | POA: Diagnosis not present

## 2023-02-22 DIAGNOSIS — Z6838 Body mass index (BMI) 38.0-38.9, adult: Secondary | ICD-10-CM

## 2023-02-22 DIAGNOSIS — E782 Mixed hyperlipidemia: Secondary | ICD-10-CM

## 2023-02-22 DIAGNOSIS — Z820 Family history of epilepsy and other diseases of the nervous system: Secondary | ICD-10-CM

## 2023-02-22 DIAGNOSIS — E559 Vitamin D deficiency, unspecified: Secondary | ICD-10-CM

## 2023-02-22 LAB — CBC WITH DIFFERENTIAL/PLATELET
Absolute Monocytes: 555 cells/uL (ref 200–950)
Basophils Absolute: 66 cells/uL (ref 0–200)
Basophils Relative: 0.7 %
Eosinophils Absolute: 273 cells/uL (ref 15–500)
Eosinophils Relative: 2.9 %
HCT: 45.4 % — ABNORMAL HIGH (ref 35.0–45.0)
Hemoglobin: 14.6 g/dL (ref 11.7–15.5)
Lymphs Abs: 2801 cells/uL (ref 850–3900)
MCH: 28.3 pg (ref 27.0–33.0)
MCHC: 32.2 g/dL (ref 32.0–36.0)
MCV: 88.2 fL (ref 80.0–100.0)
MPV: 11.4 fL (ref 7.5–12.5)
Monocytes Relative: 5.9 %
Neutro Abs: 5706 cells/uL (ref 1500–7800)
Neutrophils Relative %: 60.7 %
Platelets: 235 10*3/uL (ref 140–400)
RBC: 5.15 10*6/uL — ABNORMAL HIGH (ref 3.80–5.10)
RDW: 12.8 % (ref 11.0–15.0)
Total Lymphocyte: 29.8 %
WBC: 9.4 10*3/uL (ref 3.8–10.8)

## 2023-02-22 NOTE — Patient Instructions (Signed)
Due to recent changes in healthcare laws, you may see the results of your imaging and laboratory studies on MyChart before your provider has had a chance to review them.  We understand that in some cases there may be results that are confusing or concerning to you. Not all laboratory results come back in the same time frame and the provider may be waiting for multiple results in order to interpret others.  Please give us 48 hours in order for your provider to thoroughly review all the results before contacting the office for clarification of your results.  ++++++++++++++++++++++++++  Vit D  & Vit C 1,000 mg   are recommended to help protect  against the Covid-19 and other Corona viruses.    Also it's recommended  to take  Zinc 50 mg  to help  protect against the Covid-19   and  place to get  is also on [employer]  and don't pay more than 6-8 cents /pill !   +++++++++++++++++++++++++++++++++++++++ Recommend Adult Low Dose Aspirin or  coated  Aspirin 81 mg daily  To reduce risk of Colon Cancer 40 %,  Skin Cancer 26 % ,  Melanoma 46%  and  Pancreatic cancer 60% +++++++++++++++++++++++++++++++++++++++++ Vitamin D goal  is between 70-100.  Please make sure that you are taking your Vitamin D as directed.  It is very important as a natural anti-inflammatory  helping hair, skin, and nails, as well as reducing stroke and heart attack risk.  It helps your bones and helps with mood. It also decreases numerous cancer risks so please take it as directed.  Low Vit D is associated with a 200-300% higher risk for CANCER  and 200-300% higher risk for HEART   ATTACK  &  STROKE.   ...................................... It is also associated with higher death rate at younger ages,  autoimmune diseases like Rheumatoid arthritis, Lupus, Multiple Sclerosis.    Also many other serious conditions, like depression, Alzheimer's Dementia, infertility, muscle aches, fatigue, fibromyalgia - just to name  a few. +++++++++++++++++++++++++++++++++++++++++ Recommend the book "The END of DIETING" by Dr Joel Fuhrman  & the book "The END of DIABETES " by Dr Joel Fuhrman At [employer] - get book & Audio CD's    Being diabetic has a  300% increased risk for heart attack, stroke, cancer, and alzheimer- type vascular dementia. It is very important that you work harder with diet by avoiding all foods that are white. Avoid white rice (brown & wild rice is OK), white potatoes (sweetpotatoes in moderation is OK), White bread or wheat bread or anything made out of white flour like bagels, donuts, rolls, buns, biscuits, cakes, pastries, cookies, pizza crust, and pasta (made from white flour & egg whites) - vegetarian pasta or spinach or wheat pasta is OK. Multigrain breads like Arnold's or [employer], or multigrain sandwich thins or flatbreads.  Diet, exercise and weight loss can reverse and cure diabetes in the early stages.  Diet, exercise and weight loss is very important in the control and prevention of complications of diabetes which affects every system in your body, ie. Brain - dementia/stroke, eyes - glaucoma/blindness, heart - heart attack/heart failure, kidneys - dialysis, stomach - gastric paralysis, intestines - malabsorption, nerves - severe painful neuritis, circulation - gangrene & loss of a leg(s), and finally cancer and Alzheimers.    I recommend avoid fried & greasy foods,  sweets/candy, white rice (brown or wild rice or Quinoa is OK), white potatoes (sweet potatoes are OK) - anything   made from white flour - bagels, doughnuts, rolls, buns, biscuits,white and wheat breads, pizza crust and traditional pasta made of white flour & egg white(vegetarian pasta or spinach or wheat pasta is OK).  Multi-grain bread is OK - like multi-grain flat bread or sandwich thins. Avoid alcohol in excess. Exercise is also important.    Eat all the vegetables you want - avoid meat, especially red meat and dairy - especially  cheese.  Cheese is the most concentrated form of trans-fats which is the worst thing to clog up our arteries. Veggie cheese is OK which can be found in the fresh produce section at [employer] or [employer] or [employer]  +++++++++++++++++++++++++++++++++++++++ DASH Eating Plan  DASH stands for "Dietary Approaches to Stop Hypertension."   The DASH eating plan is a healthy eating plan that has been shown to reduce high blood pressure (hypertension). Additional health benefits may include reducing the risk of type 2 diabetes mellitus, heart disease, and stroke. The DASH eating plan may also help with weight loss. WHAT DO I NEED TO KNOW ABOUT THE DASH EATING PLAN? For the DASH eating plan, you will follow these general guidelines: Choose foods with a percent daily value for sodium of less than 5% (as listed on the food label). Use salt-free seasonings or herbs instead of table salt or sea salt. Check with your health care provider or pharmacist before using salt substitutes. Eat lower-sodium products, often labeled as "lower sodium" or "no salt added." Eat fresh foods. Eat more vegetables, fruits, and low-fat dairy products. Choose whole grains. Look for the word "whole" as the first word in the ingredient list. Choose fish  Limit sweets, desserts, sugars, and sugary drinks. Choose heart-healthy fats. Eat veggie cheese  Eat more home-cooked food and less restaurant, buffet, and fast food. Limit fried foods. Cook foods using methods other than frying. Limit canned vegetables. If you do use them, rinse them well to decrease the sodium. When eating at a restaurant, ask that your food be prepared with less salt, or no salt if possible.                      WHAT FOODS CAN I EAT? Read Dr Joel Fuhrman's books on The End of Dieting & The End of Diabetes  Grains Whole grain or whole wheat bread. Brown rice. Whole grain or whole wheat pasta. Quinoa, bulgur, and whole grain cereals. Low-sodium  cereals. Corn or whole wheat flour tortillas. Whole grain cornbread. Whole grain crackers. Low-sodium crackers.  Vegetables Fresh or frozen vegetables (raw, steamed, roasted, or grilled). Low-sodium or reduced-sodium tomato and vegetable juices. Low-sodium or reduced-sodium tomato sauce and paste. Low-sodium or reduced-sodium canned vegetables.   Fruits All fresh, canned (in natural juice), or frozen fruits.  Protein Products  All fish and seafood.  Dried beans, peas, or lentils. Unsalted nuts and seeds. Unsalted canned beans.  Dairy Low-fat dairy products, such as skim or 1% milk, 2% or reduced-fat cheeses, low-fat ricotta or cottage cheese, or plain low-fat yogurt. Low-sodium or reduced-sodium cheeses.  Fats and Oils Tub margarines without trans fats. Light or reduced-fat mayonnaise and salad dressings (reduced sodium). Avocado. Safflower, olive, or canola oils. Natural peanut or almond butter.  Other Unsalted popcorn and pretzels. The items listed above may not be a complete list of recommended foods or beverages. Contact your dietitian for more options.  +++++++++++++++  WHAT FOODS ARE NOT RECOMMENDED? Grains/ White flour or wheat flour White bread. White pasta. White rice. Refined   cornbread. Bagels and croissants. Crackers that contain trans fat.  Vegetables  Creamed or fried vegetables. Vegetables in a . Regular canned vegetables. Regular canned tomato sauce and paste. Regular tomato and vegetable juices.  Fruits Dried fruits. Canned fruit in light or heavy syrup. Fruit juice.  Meat and Other Protein Products Meat in general - RED meat & White meat.  Fatty cuts of meat. Ribs, chicken wings, all processed meats as bacon, sausage, bologna, salami, fatback, hot dogs, bratwurst and packaged luncheon meats.  Dairy Whole or 2% milk, cream, half-and-half, and cream cheese. Whole-fat or sweetened yogurt. Full-fat cheeses or blue cheese. Non-dairy creamers and whipped toppings.  Processed cheese, cheese spreads, or cheese curds.  Condiments Onion and garlic salt, seasoned salt, table salt, and sea salt. Canned and packaged gravies. Worcestershire sauce. Tartar sauce. Barbecue sauce. Teriyaki sauce. Soy sauce, including reduced sodium. Steak sauce. Fish sauce. Oyster sauce. Cocktail sauce. Horseradish. Ketchup and mustard. Meat flavorings and tenderizers. Bouillon cubes. Hot sauce. Tabasco sauce. Marinades. Taco seasonings. Relishes.  Fats and Oils Butter, stick margarine, lard, shortening and bacon fat. Coconut, palm kernel, or palm oils. Regular salad dressings.  Pickles and olives. Salted popcorn and pretzels.  The items listed above may not be a complete list of foods and beverages to avoid.  

## 2023-02-23 NOTE — Progress Notes (Signed)
^<^<^<^<^<^<^<^<^<^<^<^<^<^<^<^<^<^<^<^<^<^<^<^<^<^<^<^<^<^<^<^<^<^<^<^<^ ^>^>^>^>^>^>^>^>^>^>^>>^>^>^>^>^>^>^>^>^>^>^>^>^>^>^>^>^>^>^>^>^>^>^>^>^>  -Test results slightly outside the reference range are not unusual. If there is anything important, I will review this with you,  otherwise it is considered normal test values.  If you have further questions,  please do not hesitate to contact me at the office or via My Chart.   ^<^<^<^<^<^<^<^<^<^<^<^<^<^<^<^<^<^<^<^<^<^<^<^<^<^<^<^<^<^<^<^<^<^<^<^<^ ^>^>^>^>^>^>^>^>^>^>^>^>^>^>^>^>^>^>^>^>^>^>^>^>^>^>^>^>^>^>^>^>^>^>^>^>^  -  Kidney functions  ( GFR = 50 ) is decreased slightly again                                                                       ( In Stage 3a Chronic Kidney Disease  !  )                                                        - this is usually due to not drinking enough fluids !   - Kidney functions ( GFR )  still look a little dehydrated    - Very important to drink adequate amounts of fluids to prevent permanent damage    - Recommend drink at least 6 bottles (16 ounces) of fluids / water /day = 96 Oz ~100 oz                     ( or any type of liquid - it doesn't have to be water  )   - 100 oz = 3,000 cc or 3 liters / day  - >> That's 1 &1/2 bottles of a 2 liter soda bottle /day !   ^<^<^<^<^<^<^<^<^<^<^<^<^<^<^<^<^<^<^<^<^<^<^<^<^<^<^<^<^<^<^<^<^<^<^<^<^ ^>^>^>^>^>^>^>^>^>^>^>^>^>^>^>^>^>^>^>^>^>^>^>^>^>^>^>^>^>^>^>^>^>^>^>^>^  -  A1c = 6.0%  still in early Diabetic or preDiabetic range -   ^<^<^<^<^<^<^<^<^<^<^<^<^<^<^<^<^<^<^<^<^<^<^<^<^<^<^<^<^<^<^<^<^<^<^<^<^ ^>^>^>^>^>^>^>^>^>^>^>^>^>^>^>^>^>^>^>^>^>^>^>^>^>^>^>^>^>^>^>^>^>^>^>^>^    - A1c =   6.0 %   - higher in prediabetic range and when it reaches 6.5% or higher,  then considered a "full fledged " Diabetic - So very important to work on a better diet  Also suggest taking Cinnamon which is known to  increase Insulin sensitivity  &  therefore decrease Insulin resistance of Diabetes & PreDiabetes  - Recommend "naturebell" on [employer] Cinnamon                                                         2 capsules = 9,000 mg               240 capsules for $16.95                If take 1 capsule Daily, that's an 8 month supply  - Also recommend take Vinegar - 1 ounce in water or other drink 2 x / day                                  is  also known to lower blood sugar & A1c !  ^<^<^<^<^<^<^<^<^<^<^<^<^<^<^<^<^<^<^<^<^<^<^<^<^<^<^<^<^<^<^<^<^<^<^<^<^ ^>^>^>^>^>^>^>^>^>^>^>^>^>^>^>^>^>^>^>^>^>^>^>^>^>^>^>^>^>^>^>^>^>^>^>^>^  -  Chol = 125   &  LDL= 44    -   Both  Excellent   - Very low risk for Heart Attack  / Stroke  ^>^>^>^>^>^>^>^>^>^>^>^>^>^>^>^>^>^>^>^>^>^>^>^>^>^>^>^>^>^>^>^>^>^>^>^>^ ^>^>^>^>^>^>^>^>^>^>^>^>^>^>^>^>^>^>^>^>^>^>^>^>^>^>^>^>^>^>^>^>^>^>^>^>^  -  Vitamin D = 82 =  Excellent   - Please keep dosage  same   ^<^<^<^<^<^<^<^<^<^<^<^<^<^<^<^<^<^<^<^<^<^<^<^<^<^<^<^<^<^<^<^<^<^<^<^<^ ^>^>^>^>^>^>^>^>^>^>^>^>^>^>^>^>^>^>^>^>^>^>^>^>^>^>^>^>^>^>^>^>^>^>^>^>^  -  All Else - CBC -  Electrolytes - Liver - Magnesium & Thyroid    - all  Normal / OK  ^<^<^<^<^<^<^<^<^<^<^<^<^<^<^<^<^<^<^<^<^<^<^<^<^<^<^<^<^<^<^<^<^<^<^<^<^ ^>^>^>^>^>^>^>^>^>^>^>^>^>^>^>^>^>^>^>^>^>^>^>^>^>^>^>^>^>^>^>^>^>^>^>^>^

## 2023-02-28 ENCOUNTER — Encounter: Payer: Self-pay | Admitting: Pulmonary Disease

## 2023-02-28 ENCOUNTER — Ambulatory Visit: Payer: Medicare Other | Admitting: Pulmonary Disease

## 2023-02-28 VITALS — BP 126/80 | HR 90 | Ht 65.0 in | Wt 237.8 lb

## 2023-02-28 DIAGNOSIS — R0609 Other forms of dyspnea: Secondary | ICD-10-CM

## 2023-02-28 DIAGNOSIS — J438 Other emphysema: Secondary | ICD-10-CM | POA: Diagnosis not present

## 2023-02-28 NOTE — Progress Notes (Signed)
Jade Singh    [id number]    [birth year]  Primary Care Physician:McKeown, Chrissie Noa, MD  Referring Physician: [name], MD [address] [place],  [place] [address]  Chief complaint:   Patient being seen for emphysema  HPI:  In for follow-up today  Has been relatively stable  Quit smoking in 2000, has emphysema on CT  Has been using Breztri on a regular basis  History of hypertension  She is working on weight loss She does have some cough in the mornings  Denies any other significant concerns at present  Outpatient Encounter Medications as of 07/23/2022  Medication Sig   albuterol (VENTOLIN HFA) 108 (90 Base) MCG/ACT inhaler Inhale 2 puffs into the lungs every 6 (six) hours as needed for wheezing or shortness of breath.   aspirin 81 MG tablet Take 81 mg by mouth daily.   Budeson-Glycopyrrol-Formoterol (BREZTRI AEROSPHERE) 160-9-4.8 MCG/ACT AERO Inhale 2 puffs into the lungs in the morning and at bedtime.   buPROPion (WELLBUTRIN XL) 150 MG 24 hr tablet TAKE 1 TABLET BY MOUTH EVERY MORNING   calcipotriene (DOVONOX) 0.005 % ointment Apply 1 application topically as needed (Hands).   cetirizine (ZYRTEC) 10 MG tablet Take 10 mg by mouth daily.   Cholecalciferol (VITAMIN D3) 5000 UNITS CAPS Take 5,000 Units by mouth 4 (four) times a week. Takes M,W,TH,F   ezetimibe (ZETIA) 10 MG tablet TAKE 1 TABLET BY MOUTH DAILY KEEP UPCOMING APPOINTMENT FOR FUTURE REFILLS   Magnesium 250 MG TABS Take 250 mg by mouth daily.   Multiple Vitamins-Minerals (MULTIVITAMIN WITH MINERALS) tablet Take 1 tablet by mouth daily.   pantoprazole (PROTONIX) 40 MG tablet TAKE 1 TABLET DAILY TO PREVENT INDIGESTION & HEARTBURN   rosuvastatin (CRESTOR) 20 MG tablet TAJE 1 TABLET BY MOUTH SUN TUES THURS SAT FOR CHOLESTEROL   triamterene-hydrochlorothiazide (MAXZIDE-25) 37.5-25 MG tablet Take 1 tablet by mouth at bedtime. KEEP UPCOMING APPOINTMENT FOR FUTURE REFILLS.   zolpidem  (AMBIEN) 5 MG tablet TAKE ONE TABLET BY MOUTH EVERY EVENING ONE HOUR BEFORE BEDTIME   nitroGLYCERIN (NITROSTAT) 0.4 MG SL tablet Place 1 tablet (0.4 mg total) under the tongue every 5 (five) minutes as needed for chest pain.   No facility-administered encounter medications on file as of 07/23/2022.    Allergies as of 02/28/2023 - Review Complete 02/28/2023  Allergen Reaction Noted   Lexapro [escitalopram oxalate] Palpitations 10/29/2020   Doxycycline Other (See Comments) 07/06/2018   Levaquin [levofloxacin in d5w]  06/06/2013   Oruvail [ketoprofen] Nausea Only 06/06/2013    Past Medical History:  Diagnosis Date   ANAL FISSURE, HX OF 11/06/2007   Qualifier: Diagnosis of  By: Misty Stanley CMA (AAMA), Amanda     Anxiety    Depression    Difficulty sleeping    takes Ambien   Diverticulosis 5/1/5   DJD (degenerative joint disease)    Family history of anesthesia complication    "mother quit breathing" 30 yrs ago pt does not know any more details. 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   Unspecified vitamin D deficiency    Urgency of urination     Past Surgical History:  Procedure Laterality Date   ABDOMINAL HYSTERECTOMY  partial   APPENDECTOMY     CARDIAC CATHETERIZATION  2005    no problems per pt    KNEE ARTHROSCOPY     RT 2011, LT 2009   LEFT HEART CATH AND CORONARY ANGIOGRAPHY N/A 05/11/2019   Procedure: LEFT HEART CATH AND CORONARY ANGIOGRAPHY;  Surgeon: Iran Ouch, MD;  Location: [facility];  Service: Cardiovascular;  Laterality: N/A;   TONSILLECTOMY AND ADENOIDECTOMY     TOTAL KNEE ARTHROPLASTY Right 02/28/2014   Procedure: RIGHT TOTAL KNEE ARTHROPLASTY;  Surgeon: Javier Docker, MD;  Location: [facility];  Service: Orthopedics;  Laterality: Right;   TUBAL LIGATION       Family History  Problem Relation Age of Onset   Stroke Mother    Hyperlipidemia Mother    Heart attack Mother    Ataxia Mother        cerebellar ataxia 6   Cancer Brother 42       prostate, deceased   Heart attack Maternal Grandfather    Heart disease Paternal Grandfather    Diabetes Paternal Uncle    Ataxia Maternal Aunt    Ataxia Maternal Aunt    Colon cancer Neg Hx    Breast cancer Neg Hx     Social History   Socioeconomic History   Marital status: Married    Spouse name: Not on file   Number of children: Not on file   Years of education: Not on file   Highest education level: Not on file  Occupational History   Not on file  Tobacco Use   Smoking status: Former    Current packs/day: 0.00    Average packs/day: 3.0 packs/day for 29.0 years (87.0 ttl pk-yrs)    Types: Cigarettes    Start date: 07/19/1969    Quit date: 07/19/1998    Years since quitting: 24.6   Smokeless tobacco: Never  Substance and Sexual Activity   Alcohol use: Yes    Comment: occ   Drug use: No   Sexual activity: Not on file  Other Topics Concern   Not on file  Social History Narrative   Not on file   Social Determinants of Health   Financial Resource Strain: Not on file  Food Insecurity: Not on file  Transportation Needs: Not on file  Physical Activity: Not on file  Stress: Not on file  Social Connections: Not on file  Intimate Partner Violence: Not on file    Review of Systems  Constitutional:  Negative for fatigue.  Respiratory:  Positive for shortness of breath. Negative for cough.     Vitals:   02/28/23 0923  BP: 126/80  Pulse: 90  SpO2: 96%     Physical Exam Constitutional:      Appearance: She is obese.  HENT:     Head: Normocephalic.     Mouth/Throat:     Mouth: Mucous membranes are moist.  Eyes:     Pupils: Pupils are equal, round, and reactive to light.  Cardiovascular:     Rate and Rhythm: Normal rate and regular rhythm.     Heart sounds: No murmur  heard.    No friction rub.  Pulmonary:     Effort: No respiratory distress.     Breath sounds: No stridor. No wheezing or rhonchi.  Musculoskeletal:     Cervical back: No rigidity or tenderness.  Neurological:     Mental Status: She is alert.  Psychiatric:        Mood and Affect: Mood  normal.      Data Reviewed: CT scan of the chest was reviewed with the patient showing emphysematous changes in the upper lobes  PFT with mild obstructive disease with significant bronchodilator response  Assessment:  Obstructive lung disease -Mild obstructive lung disease on pulmonary function test  Shortness of breath with activity -She has been doing relatively well  History of hypertension -Controlled  Shortness of breath with activity -Likely multifactorial  History of hypertension  Plan/Recommendations: Continue Breztri  Graded activities as tolerated  Encouraged to give [place] a call with any significant concerns  Follow-up a year from now  Virl Diamond MD [facility] 02/28/2023, 9:53 AM  CC: [name], MD

## 2023-02-28 NOTE — Patient Instructions (Signed)
I will see you a year from now  Call us with significant concerns  Continue using Breztri on a daily basis  Continue graded activities as tolerated

## 2023-04-02 ENCOUNTER — Other Ambulatory Visit: Payer: Self-pay | Admitting: Nurse Practitioner

## 2023-04-02 DIAGNOSIS — F3341 Major depressive disorder, recurrent, in partial remission: Secondary | ICD-10-CM

## 2023-04-07 ENCOUNTER — Other Ambulatory Visit: Payer: Self-pay | Admitting: Nurse Practitioner

## 2023-04-07 DIAGNOSIS — G4700 Insomnia, unspecified: Secondary | ICD-10-CM

## 2023-04-18 ENCOUNTER — Encounter: Payer: Self-pay | Admitting: Neurology

## 2023-04-18 ENCOUNTER — Ambulatory Visit: Payer: Medicare Other | Admitting: Neurology

## 2023-04-18 VITALS — BP 155/85 | HR 102 | Ht 65.5 in | Wt 237.9 lb

## 2023-04-18 DIAGNOSIS — R269 Unspecified abnormalities of gait and mobility: Secondary | ICD-10-CM | POA: Diagnosis not present

## 2023-04-18 DIAGNOSIS — R3915 Urgency of urination: Secondary | ICD-10-CM | POA: Insufficient documentation

## 2023-04-18 NOTE — Progress Notes (Unsigned)
Chief Complaint  Patient presents with   New Patient (Initial Visit)    Rm15, alone, Internal referral for fam hx of cerebellar ataxia, poor balance      ASSESSMENT AND PLAN  Jade Singh is a 72 y.o. female   Subacute onset gait abnormality since 2024 Strong family history of cerebellar ataxia  Hyperreflexia on examination, bilateral Babinski signs,  Most worried for exam for cervical spondylitic myelopathy, also need to rule out brainstem pathology  MRI of brain, cervical spine  Depend on the result,  will consider further workup plan   DIAGNOSTIC DATA (LABS, IMAGING, TESTING) - I reviewed patient records, labs, notes, testing and imaging myself where available.   MEDICAL HISTORY:  Jade Singh, seen in request by   Jade Cowboy, MD    I reviewed and summarized the referring note. PMHX HLD Depression GERD HTN Psoriasis History of right knee replacement  Balance issues  Both ofh er auto have it.   In wheelchair 72, othe of her sister have it, wheelchair, younger sister wa in her 51ths, sten of brain just went to noting,     Just pop u, out int heopen, she has to use cane, to make sure,   4 siblings, brother died of cancer, women.  Retired, active,   Do nto exercise so much,   TRouble walking 6 months, knee pain.      PHYSICAL EXAM:   Vitals:   04/18/23 1410 04/18/23 1416  BP: (!) 175/80 (!) 155/85  Pulse: (!) 102   Weight: 237 lb 14 oz (107.9 kg)   Height: 5' 5.5" (1.664 m)    Not recorded     Body mass index is 38.98 kg/m.  PHYSICAL EXAMNIATION:  Gen: NAD, conversant, well nourised, well groomed                     Cardiovascular: Regular rate rhythm, no peripheral edema, warm, nontender. Eyes: Conjunctivae clear without exudates or hemorrhage Neck: Supple, no carotid bruits. Pulmonary: Clear to auscultation bilaterally   NEUROLOGICAL EXAM:  MENTAL STATUS: Speech/cognition: Awake, alert, oriented to history  taking and casual conversation CRANIAL NERVES: CN II: Visual fields are full to confrontation. Pupils are round equal and briskly reactive to light. CN III, IV, VI: extraocular movement are normal. No ptosis. CN V: Facial sensation is intact to light touch CN VII: Face is symmetric with normal eye closure  CN VIII: Hearing is normal to causal conversation. CN IX, X: Phonation is normal. CN XI: Head turning and shoulder shrug are intact  MOTOR: There is no pronator drift of out-stretched arms. Muscle bulk and tone are normal. Muscle strength is normal.  REFLEXES: Reflexes are 2+ and symmetric at the biceps, triceps, 3/3 knees, and ankles. Plantar responses are extensor bilaterally  SENSORY: Intact to light touch, pinprick and vibratory sensation are intact in fingers and toes.  COORDINATION: There is no trunk or limb dysmetria noted.  GAIT/STANCE: Push-up from seated position, stiff, also limited by her big body habitus  REVIEW OF SYSTEMS:  Full 14 system review of systems performed and notable only for as above All other review of systems were negative.   ALLERGIES: Allergies  Allergen Reactions   Lexapro [Escitalopram Oxalate] Palpitations    Had wheezing and palpitations   Doxycycline Other (See Comments)    Jittery and causes nausea   Levaquin [Levofloxacin In D5w]     yeast   Oruvail [Ketoprofen] Nausea Only    GI upset  HOME MEDICATIONS: Current Outpatient Medications  Medication Sig Dispense Refill   albuterol (VENTOLIN HFA) 108 (90 Base) MCG/ACT inhaler Inhale 2 puffs into the lungs every 6 (six) hours as needed for wheezing or shortness of breath. 8 g 2   aspirin 81 MG tablet Take 81 mg by mouth daily.     Budeson-Glycopyrrol-Formoterol (BREZTRI AEROSPHERE) 160-9-4.8 MCG/ACT AERO Inhale 2 puffs into the lungs in the morning and at bedtime. 10.7 g 2   buPROPion (WELLBUTRIN XL) 150 MG 24 hr tablet TAKE ONE TABLET BY MOUTH EVERY MORNING 90 tablet 3    calcipotriene (DOVONOX) 0.005 % ointment Apply 1 application topically as needed (Hands).     Cholecalciferol (VITAMIN D3) 5000 UNITS CAPS Take 5,000 Units by mouth 4 (four) times a week. Takes M,W,TH,F     ezetimibe (ZETIA) 10 MG tablet TAKE 1 TABLET BY MOUTH DAILY 30 tablet 7   hydrOXYzine (ATARAX) 10 MG tablet TAKE 1 TO 3 TABLETS BY MOUTH EVERY NIGHT AT BEDTIME AS NEEDED 90 tablet 0   Magnesium 250 MG TABS Take 250 mg by mouth daily.     Multiple Vitamins-Minerals (MULTIVITAMIN WITH MINERALS) tablet Take 1 tablet by mouth daily.     nitroGLYCERIN (NITROSTAT) 0.4 MG SL tablet Place 1 tablet (0.4 mg total) under the tongue every 5 (five) minutes as needed for chest pain. 25 tablet 1   pantoprazole (PROTONIX) 40 MG tablet TAKE 1 TABLET DAILY TO PREVENT INDIGESTION & HEARTBURN 90 tablet 3   rosuvastatin (CRESTOR) 20 MG tablet TAJE 1 TABLET BY MOUTH SUN TUES THURS SAT FOR CHOLESTEROL 48 tablet 3   triamterene-hydrochlorothiazide (MAXZIDE-25) 37.5-25 MG tablet TAKE 1 TABLET BY MOUTH AT BEDTIME. KEEP UPCOMING APPOINTMENT FOR FUTURE REFILLS. 90 tablet 2   No current facility-administered medications for this visit.    PAST MEDICAL HISTORY: Past Medical History:  Diagnosis Date   ANAL FISSURE, HX OF 11/06/2007   Qualifier: Diagnosis of  By: Misty Stanley CMA (AAMA), Amanda     Anxiety    Depression    Difficulty sleeping    takes Ambien   Diverticulosis 5/1/5   DJD (degenerative joint disease)    Family history of anesthesia complication    "mother quit breathing" 30 yrs ago pt does not know any more details. 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   Unspecified vitamin D deficiency    Urgency of urination     PAST SURGICAL HISTORY: Past Surgical History:  Procedure Laterality Date   ABDOMINAL HYSTERECTOMY      partial   APPENDECTOMY     CARDIAC CATHETERIZATION  2005    no problems per pt    KNEE ARTHROSCOPY     RT 2011, LT 2009   LEFT HEART CATH AND CORONARY ANGIOGRAPHY N/A 05/11/2019   Procedure: LEFT HEART CATH AND CORONARY ANGIOGRAPHY;  Surgeon: Iran Ouch, MD;  [facility]: [facility];  Service: Cardiovascular;  Laterality: N/A;   TONSILLECTOMY AND ADENOIDECTOMY     TOTAL KNEE ARTHROPLASTY Right 02/28/2014   Procedure: RIGHT TOTAL KNEE ARTHROPLASTY;  Surgeon: Javier Docker, MD;  Location: [facility];  Service: Orthopedics;  Laterality: Right;   TUBAL LIGATION      FAMILY HISTORY: Family History  Problem Relation Age of Onset  Stroke Mother    Hyperlipidemia Mother    Heart attack Mother    Ataxia Mother        cerebellar ataxia 6   Cancer Brother 45       prostate, deceased   Heart attack Maternal Grandfather    Heart disease Paternal Grandfather    Diabetes Paternal Uncle    Ataxia Maternal Aunt    Ataxia Maternal Aunt    Colon cancer Neg Hx    Breast cancer Neg Hx     SOCIAL HISTORY: Social History   Socioeconomic History   Marital status: Married    Spouse name: Not on file   Number of children: Not on file   Years of education: Not on file   Highest education level: Not on file  Occupational History   Not on file  Tobacco Use   Smoking status: Former    Current packs/day: 0.00    Average packs/day: 3.0 packs/day for 29.0 years (87.0 ttl pk-yrs)    Types: Cigarettes    Start date: 07/19/1969    Quit date: 07/19/1998    Years since quitting: 24.7   Smokeless tobacco: Never  Substance and Sexual Activity   Alcohol use: Yes    Comment: occ   Drug use: No   Sexual activity: Not on file  Other Topics Concern   Not on file  Social History Narrative   Not on file   Social Determinants of Health   Financial Resource Strain: Not on file  Food Insecurity: Not on file  Transportation Needs: Not on file  Physical Activity: Not on file  Stress: Not on  file  Social Connections: Not on file  Intimate Partner Violence: Not on file      Levert Feinstein, M.D. Ph.D.  [facility] [address] [place], [place] [address] Ph: [phone] Fax: [phone]  CC:  Jade Cowboy, MD [address] [place],  [place] [address]  Jade Cowboy, MD

## 2023-04-21 ENCOUNTER — Telehealth: Payer: Self-pay | Admitting: Neurology

## 2023-04-21 NOTE — Telephone Encounter (Signed)
[employer] medicare NPR sent to GI [phone] 

## 2023-05-03 ENCOUNTER — Ambulatory Visit
Admission: RE | Admit: 2023-05-03 | Discharge: 2023-05-03 | Disposition: A | Discharge status: Home / Self Care | Payer: Medicare Other | Source: Ambulatory Visit | Attending: Neurology | Admitting: Neurology

## 2023-05-03 DIAGNOSIS — R269 Unspecified abnormalities of gait and mobility: Secondary | ICD-10-CM

## 2023-05-03 DIAGNOSIS — R3915 Urgency of urination: Secondary | ICD-10-CM

## 2023-05-05 ENCOUNTER — Telehealth: Payer: Self-pay | Admitting: Neurology

## 2023-05-05 DIAGNOSIS — R3915 Urgency of urination: Secondary | ICD-10-CM

## 2023-05-05 DIAGNOSIS — R269 Unspecified abnormalities of gait and mobility: Secondary | ICD-10-CM

## 2023-05-05 NOTE — Telephone Encounter (Signed)
Please call patient, MRI of the brain was normal, cervical spine showed degenerative changes, but there was no evidence of spinal cord compression, most noticeable at the lower cervical region with variable degree of foraminal narrowing, that would not explain her gait abnormality  I have ordered MRI of the thoracic and lumbar spine for further evaluation  Orders Placed This Encounter  Procedures   MR THORACIC SPINE WO CONTRAST   MR LUMBAR SPINE WO CONTRAST        Normal MRI brain (with and without).  IMPRESSION:    MRI cervical spine (without) demonstrating: - At C4-5, C5-6 disc bulging and facet hypertrophy with mild spinal stenosis and severe bilateral foraminal stenosis; no cord signal abnormalities. - At C6-7 disc bulging and facet hypertrophy with severe left foraminal stenosis.

## 2023-05-05 NOTE — Telephone Encounter (Signed)
Called and relayed results to patient and informed them about another MRI and she was agreeable to the plan. Will wait for the call to schedule.

## 2023-05-15 ENCOUNTER — Other Ambulatory Visit: Payer: Self-pay | Admitting: Cardiovascular Disease

## 2023-05-20 ENCOUNTER — Ambulatory Visit
Admission: RE | Admit: 2023-05-20 | Discharge: 2023-05-20 | Disposition: A | Discharge status: Home / Self Care | Payer: Medicare Other | Source: Ambulatory Visit | Attending: Neurology | Admitting: Neurology

## 2023-05-20 DIAGNOSIS — R3915 Urgency of urination: Secondary | ICD-10-CM

## 2023-05-20 DIAGNOSIS — R269 Unspecified abnormalities of gait and mobility: Secondary | ICD-10-CM

## 2023-05-25 ENCOUNTER — Ambulatory Visit: Payer: Medicare Other | Admitting: Nurse Practitioner

## 2023-06-09 ENCOUNTER — Encounter: Payer: Self-pay | Admitting: Internal Medicine

## 2023-06-15 ENCOUNTER — Other Ambulatory Visit: Payer: Self-pay | Admitting: Internal Medicine

## 2023-06-15 DIAGNOSIS — G4700 Insomnia, unspecified: Secondary | ICD-10-CM

## 2023-06-15 MED ORDER — HYDROXYZINE HCL 10 MG PO TABS
ORAL_TABLET | ORAL | 0 refills | Status: DC
Start: 2023-06-15 — End: 2023-11-08

## 2023-06-17 ENCOUNTER — Other Ambulatory Visit: Payer: Self-pay | Admitting: Internal Medicine

## 2023-06-17 DIAGNOSIS — K219 Gastro-esophageal reflux disease without esophagitis: Secondary | ICD-10-CM

## 2023-06-27 ENCOUNTER — Other Ambulatory Visit (HOSPITAL_BASED_OUTPATIENT_CLINIC_OR_DEPARTMENT_OTHER): Payer: Self-pay

## 2023-06-27 MED ORDER — INFLUENZA VAC A&B SURF ANT ADJ 0.5 ML IM SUSY
0.5000 mL | PREFILLED_SYRINGE | Freq: Once | INTRAMUSCULAR | 0 refills | Status: AC
  Filled 2023-06-27: qty 0.5, 1d supply, fill #0

## 2023-06-27 MED ORDER — COVID-19 MRNA VAC-TRIS(PFIZER) 30 MCG/0.3ML IM SUSY
0.3000 mL | PREFILLED_SYRINGE | Freq: Once | INTRAMUSCULAR | 0 refills | Status: AC
  Filled 2023-06-27: qty 0.3, 1d supply, fill #0

## 2023-06-30 DIAGNOSIS — M1712 Unilateral primary osteoarthritis, left knee: Secondary | ICD-10-CM | POA: Diagnosis not present

## 2023-07-08 DIAGNOSIS — M1712 Unilateral primary osteoarthritis, left knee: Secondary | ICD-10-CM | POA: Diagnosis not present

## 2023-07-15 DIAGNOSIS — M1712 Unilateral primary osteoarthritis, left knee: Secondary | ICD-10-CM | POA: Diagnosis not present

## 2023-08-11 ENCOUNTER — Other Ambulatory Visit: Payer: Self-pay | Admitting: Cardiovascular Disease

## 2023-08-16 ENCOUNTER — Other Ambulatory Visit: Payer: Self-pay | Admitting: Cardiovascular Disease

## 2023-08-22 ENCOUNTER — Encounter: Payer: Medicare Other | Admitting: Internal Medicine

## 2023-08-25 ENCOUNTER — Encounter: Payer: Medicare Other | Admitting: Internal Medicine

## 2023-08-29 DIAGNOSIS — H35033 Hypertensive retinopathy, bilateral: Secondary | ICD-10-CM | POA: Diagnosis not present

## 2023-08-29 DIAGNOSIS — H2512 Age-related nuclear cataract, left eye: Secondary | ICD-10-CM | POA: Diagnosis not present

## 2023-08-29 DIAGNOSIS — H43812 Vitreous degeneration, left eye: Secondary | ICD-10-CM | POA: Diagnosis not present

## 2023-08-29 DIAGNOSIS — H43392 Other vitreous opacities, left eye: Secondary | ICD-10-CM | POA: Diagnosis not present

## 2023-08-29 DIAGNOSIS — H43821 Vitreomacular adhesion, right eye: Secondary | ICD-10-CM | POA: Diagnosis not present

## 2023-10-11 ENCOUNTER — Other Ambulatory Visit: Payer: Self-pay | Admitting: Cardiovascular Disease

## 2023-10-19 ENCOUNTER — Ambulatory Visit: Payer: Medicare Other | Admitting: Cardiovascular Disease

## 2023-10-21 ENCOUNTER — Ambulatory Visit: Admitting: Cardiovascular Disease

## 2023-11-08 ENCOUNTER — Ambulatory Visit (INDEPENDENT_AMBULATORY_CARE_PROVIDER_SITE_OTHER): Admitting: Family Medicine

## 2023-11-08 ENCOUNTER — Encounter: Payer: Self-pay | Admitting: Family Medicine

## 2023-11-08 VITALS — BP 136/82 | HR 73 | Temp 98.2°F | Ht 65.0 in | Wt 231.0 lb

## 2023-11-08 DIAGNOSIS — E559 Vitamin D deficiency, unspecified: Secondary | ICD-10-CM | POA: Diagnosis not present

## 2023-11-08 DIAGNOSIS — F3341 Major depressive disorder, recurrent, in partial remission: Secondary | ICD-10-CM

## 2023-11-08 DIAGNOSIS — F419 Anxiety disorder, unspecified: Secondary | ICD-10-CM

## 2023-11-08 DIAGNOSIS — E7800 Pure hypercholesterolemia, unspecified: Secondary | ICD-10-CM

## 2023-11-08 DIAGNOSIS — J438 Other emphysema: Secondary | ICD-10-CM

## 2023-11-08 DIAGNOSIS — I10 Essential (primary) hypertension: Secondary | ICD-10-CM | POA: Diagnosis not present

## 2023-11-08 DIAGNOSIS — R7303 Prediabetes: Secondary | ICD-10-CM

## 2023-11-08 DIAGNOSIS — G4700 Insomnia, unspecified: Secondary | ICD-10-CM

## 2023-11-08 LAB — LIPID PANEL

## 2023-11-08 LAB — BAYER DCA HB A1C WAIVED: HB A1C (BAYER DCA - WAIVED): 5.8 % — ABNORMAL HIGH (ref 4.8–5.6)

## 2023-11-08 MED ORDER — HYDROXYZINE HCL 10 MG PO TABS
ORAL_TABLET | ORAL | 0 refills | Status: DC
Start: 2023-11-08 — End: 2024-01-11

## 2023-11-08 NOTE — Progress Notes (Signed)
 New Patient Office Visit  Subjective   Patient ID: MALESSA Singh, female    DOB: 09-30-1950  Age: 73 y.o. MRN: [id number]  CC:  Chief Complaint  Patient presents with   New Patient (Initial Visit)    Establish care CPE    HPI Jade Singh presents to establish care She is established with Cardiology and Neurology for impaired balance. However states that "they did not find anything"   Her main concern today is to discuss anxiety, depression, grief. She recently lost her husband of 37 years to complications of pancreatic cancer. She is taking hydroxyzine  to help her sleep, but has run out. She is taking wellbutrin . States that she does not like xanax, has previously tried valium , which helped her some and she would like to take this again.  Denies any thoughts of SI. She has not tried counseling, but is interested in it.   She admits that she uses breztri  as needed. Reports that her symptoms are limited and she is not impacted by her emphysema often. Reports that she has some trouble operating inhaler. She is using a spacer.   She is established with cardiology. Compliant with medications. States that she has follow up with him in June.   Outpatient Encounter Medications as of 11/08/2023  Medication Sig   albuterol  (VENTOLIN  HFA) 108 (90 Base) MCG/ACT inhaler Inhale 2 puffs into the lungs every 6 (six) hours as needed for wheezing or shortness of breath.   aspirin  81 MG tablet Take 81 mg by mouth daily.   Budeson-Glycopyrrol-Formoterol (BREZTRI  AEROSPHERE) 160-9-4.8 MCG/ACT AERO Inhale 2 puffs into the lungs in the morning and at bedtime.   buPROPion  (WELLBUTRIN  XL) 150 MG 24 hr tablet TAKE ONE TABLET BY MOUTH EVERY MORNING   calcipotriene (DOVONOX) 0.005 % ointment Apply 1 application topically as needed (Hands).   Cholecalciferol  (VITAMIN D3) 5000 UNITS CAPS Take 5,000 Units by mouth 4 (four) times a week. Takes Jade,W,TH,F   ezetimibe  (ZETIA ) 10 MG tablet TAKE 1 TABLET BY  MOUTH DAILY   hydrOXYzine  (ATARAX ) 10 MG tablet Take  1 to 3 tablets  at Bedtime  as needed for Sleep                                 /                                                                   TAKE                                         BY                                                 MOUTH   Magnesium 250 MG TABS Take 250 mg by mouth daily.   Multiple Vitamins-Minerals (MULTIVITAMIN WITH MINERALS) tablet Take 1 tablet by mouth daily.   nitroGLYCERIN  (NITROSTAT ) 0.4 MG SL tablet Place 1 tablet (0.4 mg  total) under the tongue every 5 (five) minutes as needed for chest pain.   pantoprazole  (PROTONIX ) 40 MG tablet TAKE 1 TABLET DAILY TO PREVENT INDIGESTION & HEARTBURN   rosuvastatin  (CRESTOR ) 20 MG tablet TAJE 1 TABLET BY MOUTH SUN TUES THURS SAT FOR CHOLESTEROL   triamterene -hydrochlorothiazide  (MAXZIDE -25) 37.5-25 MG tablet TAKE 1 TABLET AT BEDTIME. PLEASE CALL [phone] TO SCHEDULE AN APPOINTMENT FOR FUTURE REFILLS   No facility-administered encounter medications on file as of 11/08/2023.    Past Medical History:  Diagnosis Date   ANAL FISSURE, HX OF 11/06/2007   Qualifier: Diagnosis of  By: Lewellyn CMA (AAMA), Amanda     Anxiety    Depression    Difficulty sleeping    takes Ambien    Diverticulosis 5/1/5   DJD (degenerative joint disease)    Family history of anesthesia complication    "mother quit breathing" 30 yrs ago pt does not know any more details. 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   Unspecified vitamin D  deficiency    Urgency of urination     Past Surgical History:  Procedure Laterality Date   ABDOMINAL HYSTERECTOMY     partial   APPENDECTOMY     CARDIAC CATHETERIZATION  2005    no problems per pt    KNEE ARTHROSCOPY     RT 2011, LT 2009   LEFT HEART CATH AND  CORONARY ANGIOGRAPHY N/A 05/11/2019   Procedure: LEFT HEART CATH AND CORONARY ANGIOGRAPHY;  Surgeon: Wenona Hamilton, MD;  Location: [facility];  Service: Cardiovascular;  Laterality: N/A;   TONSILLECTOMY AND ADENOIDECTOMY     TOTAL KNEE ARTHROPLASTY Right 02/28/2014   Procedure: RIGHT TOTAL KNEE ARTHROPLASTY;  Surgeon: Loel Ring, MD;  Location: [facility];  Service: Orthopedics;  Laterality: Right;   TUBAL LIGATION      Family History  Problem Relation Age of Onset   Stroke Mother    Hyperlipidemia Mother    Heart attack Mother    Ataxia Mother        cerebellar ataxia 6   Cancer Brother 28       prostate, deceased   Heart attack Maternal Grandfather    Heart disease Paternal Grandfather    Diabetes Paternal Uncle    Ataxia Maternal Aunt    Ataxia Maternal Aunt    Colon cancer Neg Hx    Breast cancer Neg Hx     Social History   Socioeconomic History   Marital status: Married    Spouse name: Not on file   Number of children: Not on file   Years of education: Not on file   Highest education level: 12th grade  Occupational History   Not on file  Tobacco Use   Smoking status: Former    Current packs/day: 0.00    Average packs/day: 3.0 packs/day for 29.0 years (87.0 ttl pk-yrs)    Types: Cigarettes    Start date: 07/19/1969    Quit date: 07/19/1998    Years since quitting: 25.3   Smokeless tobacco: Never  Substance and Sexual Activity   Alcohol use: Yes    Comment: occ   Drug use: No   Sexual activity: Not on file  Other Topics Concern   Not on file  Social History Narrative   Not on file   Social Drivers of Health   Financial Resource Strain: Low Risk  (11/05/2023)   Overall Financial Resource Strain (CARDIA)    Difficulty of Paying Living Expenses: Not hard at all  Food Insecurity: No Food Insecurity (11/05/2023)   Hunger Vital Sign    Worried About Running Out of Food in the Last Year: Never true    Ran Out of Food in the Last Year: Never true   Transportation Needs: No Transportation Needs (11/05/2023)   PRAPARE - Administrator, Civil Service (Medical): No    Lack of Transportation (Non-Medical): No  Physical Activity: Unknown (11/05/2023)   Exercise Vital Sign    Days of Exercise per Week: 0 days    Minutes of Exercise per Session: Not on file  Stress: Stress Concern Present (11/05/2023)   [employer] of Occupational Health - Occupational Stress Questionnaire    Feeling of Stress : Rather much  Social Connections: Socially Isolated (11/05/2023)   Social Connection and Isolation Panel [NHANES]    Frequency of Communication with Friends and Family: More than three times a week    Frequency of Social Gatherings with Friends and Family: Three times a week    Attends Religious Services: Never    Active Member of Clubs or Organizations: No    Attends Banker Meetings: Not on file    Marital Status: Widowed  Intimate Partner Violence: Not At Risk (11/08/2023)   Humiliation, Afraid, Rape, and Kick questionnaire    Fear of Current or Ex-Partner: No    Emotionally Abused: No    Physically Abused: No    Sexually Abused: No    ROS As per HPI   Objective   BP 136/82   Pulse 73   Temp 98.2 F (36.8 C)   Ht 5\' 5"  (1.651 Jade)   Wt 231 lb (104.8 kg)   SpO2 93%   BMI 38.44 kg/Jade   Physical Exam Constitutional:      General: She is awake. She is not in acute distress.    Appearance: Normal appearance. She is well-developed and well-groomed. She is obese. She is not ill-appearing, toxic-appearing or diaphoretic.  Cardiovascular:     Rate and Rhythm: Normal rate and regular rhythm.     Pulses: Normal pulses.          Radial pulses are 2+ on the right side and 2+ on the left side.       Posterior tibial pulses are 2+ on the right side and 2+ on the left side.     Heart sounds: Normal heart sounds. No murmur heard.    No gallop.  Pulmonary:     Effort: Pulmonary effort is normal. No respiratory  distress.     Breath sounds: Normal breath sounds. No stridor. No wheezing, rhonchi or rales.  Musculoskeletal:     Cervical back: Full passive range of motion without pain and neck supple.     Right lower leg: No edema.     Left lower leg: No edema.  Skin:    General: Skin is warm.     Capillary Refill: Capillary refill takes less than 2 seconds.  Neurological:     General: No focal deficit present.     Mental Status: She is alert, oriented to person, place, and time and easily aroused. Mental status is at baseline.     GCS: GCS eye subscore is 4. GCS verbal subscore is 5. GCS  motor subscore is 6.     Motor: No weakness.  Psychiatric:        Attention and Perception: Attention and perception normal.        Mood and Affect: Mood normal. Affect is tearful.        Speech: Speech normal.        Behavior: Behavior normal. Behavior is cooperative.        Thought Content: Thought content normal. Thought content does not include homicidal or suicidal ideation. Thought content does not include homicidal or suicidal plan.        Cognition and Memory: Cognition and memory normal.        Judgment: Judgment normal.        11/08/2023    8:24 AM 11/22/2022    2:40 PM 08/16/2022   12:37 AM  Depression screen PHQ 2/9  Decreased Interest 1 0 0  Down, Depressed, Hopeless 0 0 0  PHQ - 2 Score 1 0 0  Altered sleeping 0    Tired, decreased energy 1    Change in appetite 0    Feeling bad or failure about yourself  0    Trouble concentrating 0    Moving slowly or fidgety/restless 0    Suicidal thoughts 0    PHQ-9 Score 2    Difficult doing work/chores Not difficult at all        11/08/2023    8:24 AM  GAD 7 : Generalized Anxiety Score  Nervous, Anxious, on Edge 1  Control/stop worrying 0  Worry too much - different things 1  Trouble relaxing 0  Restless 1  Easily annoyed or irritable 0  Afraid - awful might happen 1  Total GAD 7 Score 4  Anxiety Difficulty Not difficult at all     Assessment & Plan:  1. Primary hypertension (Primary) Slightly above goal. Continue to monitor at home and follow up with cardiology. Labs as below. Will communicate results to patient once available. Will await results to determine next steps.  - CBC with Differential/Platelet - CMP14+EGFR  2. Anxiety Referral placed as below.  Patient and/or legal guardian verbally consented to [facility] services about presenting concerns and psychiatric consultation as appropriate.  The services will be billed as appropriate for the patient. Discussed with patient that we do not start controlled substances on the first visit. Discussed that she can use atarax  for anxiety as well as for sleep. She has tablets, encouraged her to start with half tablet during the day.  Denies SI. Safety contract established.  - Ambulatory referral to Integrated Behavioral Health - hydrOXYzine  (ATARAX ) 10 MG tablet; Take  1 to 3 tablets  at Bedtime  as needed for Sleep                                 /                                                                   TAKE  BY                                                 MOUTH  Dispense: 270 tablet; Refill: 0  3. Recurrent major depressive disorder, in partial remission (HCC) As above.  - Ambulatory referral to Integrated Behavioral Health  4. Vitamin D  deficiency Labs as below. Will communicate results to patient once available. Will await results to determine next steps.  - VITAMIN D  25 Hydroxy (Vit-D Deficiency, Fractures)  5. Hypercholesterolemia Labs as below. Will communicate results to patient once available. Will await results to determine next steps.  Compliant with medications  - Lipid panel  6. Other emphysema (HCC) Discussed compliance with Breztri . Encouraged use with spacer.   7. Prediabetes Labs as below. Will communicate results to patient once available. Will await results to  determine next steps.  - Bayer DCA Hb A1c Waived  8. Insomnia, unspecified type As above.  - hydrOXYzine  (ATARAX ) 10 MG tablet; Take  1 to 3 tablets  at Bedtime  as needed for Sleep                                 /                                                                   TAKE                                         BY                                                 MOUTH  Dispense: 270 tablet; Refill: 0  The above assessment and management plan was discussed with the patient. The patient verbalized understanding of and has agreed to the management plan using shared-decision making. Patient is aware to call the clinic if they develop any new symptoms or if symptoms fail to improve or worsen. Patient is aware when to return to the clinic for a follow-up visit. Patient educated on when it is appropriate to go to the emergency department.   Return in about 2 months (around 01/08/2024) for Chronic Condition Follow up.   Jacqualyn Mates, DNP-FNP [facility] [address] [place], [place] [address] [phone]

## 2023-11-09 ENCOUNTER — Encounter: Payer: Self-pay | Admitting: Family Medicine

## 2023-11-09 ENCOUNTER — Telehealth: Payer: Self-pay

## 2023-11-09 LAB — CBC WITH DIFFERENTIAL/PLATELET
Basophils Absolute: 0.1 10*3/uL (ref 0.0–0.2)
Basos: 1 %
EOS (ABSOLUTE): 0.3 10*3/uL (ref 0.0–0.4)
Eos: 4 %
Hematocrit: 44.9 % (ref 34.0–46.6)
Hemoglobin: 14.1 g/dL (ref 11.1–15.9)
Immature Grans (Abs): 0 10*3/uL (ref 0.0–0.1)
Immature Granulocytes: 0 %
Lymphocytes Absolute: 2.2 10*3/uL (ref 0.7–3.1)
Lymphs: 32 %
MCH: 28.2 pg (ref 26.6–33.0)
MCHC: 31.4 g/dL — ABNORMAL LOW (ref 31.5–35.7)
MCV: 90 fL (ref 79–97)
Monocytes Absolute: 0.4 10*3/uL (ref 0.1–0.9)
Monocytes: 6 %
Neutrophils Absolute: 3.9 10*3/uL (ref 1.4–7.0)
Neutrophils: 57 %
Platelets: 215 10*3/uL (ref 150–450)
RBC: 5 x10E6/uL (ref 3.77–5.28)
RDW: 12.6 % (ref 11.7–15.4)
WBC: 6.8 10*3/uL (ref 3.4–10.8)

## 2023-11-09 LAB — CMP14+EGFR
ALT: 19 IU/L (ref 0–32)
AST: 15 IU/L (ref 0–40)
Albumin: 4 g/dL (ref 3.8–4.8)
Alkaline Phosphatase: 65 IU/L (ref 44–121)
BUN/Creatinine Ratio: 23 (ref 12–28)
BUN: 24 mg/dL (ref 8–27)
Bilirubin Total: 0.4 mg/dL (ref 0.0–1.2)
CO2: 24 mmol/L (ref 20–29)
Calcium: 9.4 mg/dL (ref 8.7–10.3)
Chloride: 106 mmol/L (ref 96–106)
Creatinine, Ser: 1.03 mg/dL — ABNORMAL HIGH (ref 0.57–1.00)
Globulin, Total: 1.8 g/dL (ref 1.5–4.5)
Glucose: 106 mg/dL — ABNORMAL HIGH (ref 70–99)
Potassium: 4.3 mmol/L (ref 3.5–5.2)
Sodium: 144 mmol/L (ref 134–144)
Total Protein: 5.8 g/dL — ABNORMAL LOW (ref 6.0–8.5)
eGFR: 58 mL/min/{1.73_m2} — ABNORMAL LOW (ref >59)

## 2023-11-09 LAB — VITAMIN D 25 HYDROXY (VIT D DEFICIENCY, FRACTURES): Vit D, 25-Hydroxy: 74.7 ng/mL (ref 30.0–100.0)

## 2023-11-09 LAB — LIPID PANEL
Cholesterol, Total: 107 mg/dL (ref 100–199)
HDL: 57 mg/dL (ref >39)
LDL CALC COMMENT:: 1.9 ratio (ref 0.0–4.4)
LDL Chol Calc (NIH): 35 mg/dL (ref 0–99)
Triglycerides: 71 mg/dL (ref 0–149)
VLDL Cholesterol Cal: 15 mg/dL (ref 5–40)

## 2023-11-09 NOTE — Progress Notes (Signed)
 Slight variation on MCHC, not concerning at this time. GFR and creatinine (kidney function, stable). Total protein slightly decreased, recommend increasing protein in diet. In prediabetes range with A1C, recommend healthy lifestyle choices, including diet (rich in fruits, vegetables, and lean proteins, and low in salt and simple carbohydrates) and exercise (at least 30 minutes of moderate physical activity daily). Limit beverages high is sugar. Recommend at least 80-100 oz of water daily. Cholesterol within normal range, continue crestor .

## 2023-11-09 NOTE — Telephone Encounter (Signed)
 Copied from CRM [id number]. Topic: Clinical - Lab/Test Results >> Nov 09, 2023 10:21 AM Ivette P wrote: Reason for CRM: Pt called in about a missed call for lab resutls. Read results as follows:   "Slight variation on MCHC, not concerning at this time. GFR and creatinine (kidney function, stable). Total protein slightly decreased, recommend increasing protein in diet. In prediabetes range with A1C, recommend healthy lifestyle choices, including diet (rich in fruits, vegetables, and lean proteins, and low in salt and simple carbohydrates) and exercise (at least 30 minutes of moderate physical activity daily). Limit beverages high is sugar. Recommend at least 80-100 oz of water daily. Cholesterol within normal range, continue crestor . "   Pt understood no further questions.

## 2023-11-16 ENCOUNTER — Other Ambulatory Visit: Payer: Self-pay | Admitting: Cardiovascular Disease

## 2023-11-22 ENCOUNTER — Ambulatory Visit: Payer: Medicare Other | Admitting: Nurse Practitioner

## 2023-11-22 ENCOUNTER — Telehealth: Payer: Self-pay | Admitting: Family Medicine

## 2023-11-22 NOTE — Telephone Encounter (Signed)
 Tried calling pt to let her know that her PCP referred her for mental health related issue and Delrae Field is a mental health counselor we have here at the office, which is who she is scheduled to see for mental health referral.  Copied from CRM #[id number]. Topic: General - Call Back - No Documentation >> Nov 22, 2023  1:23 PM DeAngela L wrote: Reason for CRM: Patient call about appt scheduled for 6/3/ with Regina Capes patient states she is not familiar with this Provider or the appt and would like to ask what the appt is scheduled for Patient number [phone] (M)

## 2023-12-14 ENCOUNTER — Other Ambulatory Visit: Payer: Self-pay | Admitting: *Deleted

## 2023-12-14 DIAGNOSIS — E782 Mixed hyperlipidemia: Secondary | ICD-10-CM

## 2023-12-14 MED ORDER — ROSUVASTATIN CALCIUM 20 MG PO TABS: ORAL_TABLET | ORAL | 0 refills | Status: DC

## 2023-12-20 ENCOUNTER — Ambulatory Visit: Admitting: Professional Counselor

## 2023-12-20 DIAGNOSIS — F4321 Adjustment disorder with depressed mood: Secondary | ICD-10-CM

## 2023-12-20 NOTE — BH Specialist Note (Signed)
 Collaborative Care Initial Assessment  Session Start time: 9:00 am   Session End time: 10:00 am  Total time in minutes: 60 min   Type of Contact:  Face to Face Patient consent obtained:  Yes Types of Service: Collaborative care  Summary  Patient is a 73 yo female being referred to collaborative care by her pcp for anxiety and depression. Patient was engaged and cooperative during session.    Reason for referral in patient/family's own words:  I wanted some valium  to knock the edge off  Patient's goal for today's visit: I am hoping to get back on valium  it worked for me  History of Present illness:   The patient is a 73 year old female who presented for a collaborative care assessment primarily due to ongoing grief and sleep difficulties. She reports that her husband passed away unexpectedly in 28-Oct-2023, and she has struggled to adjust to the loss. In addition to this recent bereavement, she also experienced the loss of both of her parents a few years ago, contributing to compounding grief. She describes longstanding issues with depression and anxiety, as well as chronic pain in her neck and back, which further complicate her ability to rest. The patient currently reports difficulty falling asleep and staying asleep, along with feelings of loneliness and isolation. She is retired and has noticed a decrease in her appetite since her husband's passing.  She has a history of insomnia and was previously treated with Ambien  for many years before her provider discontinued it. She was later prescribed Valium , which she reports was highly effective, but she has been unable to obtain a continued prescription. Most recently, she was prescribed hydroxyzine  by her new primary care provider, which she says is moderately helpful but not as effective as Valium . She denies any history of bipolar disorder, suicidality, trauma, psychiatric hospitalizations, or substance use issues. She describes having had a  relatively good life overall and is primarily seeking support during this difficult time. She is open to discussing traditional therapy or psychiatry and is hoping to find a medication regimen that better supports her sleep needs. A psychiatric consultation will be scheduled to evaluate medication options and overall mental health needs, with a follow-up visit planned in two weeks.   Clinical Assessment   PHQ-9 Assessments:    12/20/2023   10:16 AM 11/08/2023    8:24 AM 11/22/2022    2:40 PM 08/16/2022   12:37 AM 03/11/2022    2:51 PM  Depression screen PHQ 2/9  Decreased Interest 1 1 0 0 0  Down, Depressed, Hopeless 1 0 0 0 0  PHQ - 2 Score 2 1 0 0 0  Altered sleeping 2 0     Tired, decreased energy 1 1     Change in appetite 1 0     Feeling bad or failure about yourself  0 0     Trouble concentrating 0 0     Moving slowly or fidgety/restless 0 0     Suicidal thoughts 0 0     PHQ-9 Score 6 2     Difficult doing work/chores Not difficult at all Not difficult at all       GAD-7 Assessments:    12/20/2023   10:17 AM 11/08/2023    8:24 AM  GAD 7 : Generalized Anxiety Score  Nervous, Anxious, on Edge 1 1  Control/stop worrying 1 0  Worry too much - different things 1 1  Trouble relaxing 0 0  Restless 0 1  Easily  annoyed or irritable 0 0  Afraid - awful might happen 0 1  Total GAD 7 Score 3 4  Anxiety Difficulty Somewhat difficult Not difficult at all     Social History:  Household: Lives alone Marital status: Widowed Number of Children: 1 Employment: Retired EducationPsychologist, forensic  Psychiatric Review of systems: Insomnia: Always had sleep problems.  Changes in appetite: Not eating as much as I use too before my husband died.  Decreased need for sleep: No Family history of bipolar disorder: No Hallucinations: No   Paranoia: No    Psychotropic medications: Current medications: Hydroxizine Patient taking medications as prescribed:  Yes Side effects reported: No  Current  medications (medication list) Current Outpatient Medications on File Prior to Visit  Medication Sig Dispense Refill   albuterol  (VENTOLIN  HFA) 108 (90 Base) MCG/ACT inhaler Inhale 2 puffs into the lungs every 6 (six) hours as needed for wheezing or shortness of breath. 8 g 2   aspirin  81 MG tablet Take 81 mg by mouth daily.     Budeson-Glycopyrrol-Formoterol (BREZTRI  AEROSPHERE) 160-9-4.8 MCG/ACT AERO Inhale 2 puffs into the lungs in the morning and at bedtime. 10.7 g 2   buPROPion  (WELLBUTRIN  XL) 150 MG 24 hr tablet TAKE ONE TABLET BY MOUTH EVERY MORNING 90 tablet 3   calcipotriene (DOVONOX) 0.005 % ointment Apply 1 application topically as needed (Hands).     Cholecalciferol  (VITAMIN D3) 5000 UNITS CAPS Take 5,000 Units by mouth 4 (four) times a week. Takes M,W,TH,F     ezetimibe  (ZETIA ) 10 MG tablet TAKE 1 TABLET BY MOUTH DAILY 90 tablet 3   hydrOXYzine  (ATARAX ) 10 MG tablet Take  1 to 3 tablets  at Bedtime  as needed for Sleep                                 /                                                                   TAKE                                         BY                                                 MOUTH 270 tablet 0   Magnesium 250 MG TABS Take 250 mg by mouth daily.     Multiple Vitamins-Minerals (MULTIVITAMIN WITH MINERALS) tablet Take 1 tablet by mouth daily.     nitroGLYCERIN  (NITROSTAT ) 0.4 MG SL tablet Place 1 tablet (0.4 mg total) under the tongue every 5 (five) minutes as needed for chest pain. 25 tablet 1   pantoprazole  (PROTONIX ) 40 MG tablet TAKE 1 TABLET DAILY TO PREVENT INDIGESTION & HEARTBURN 90 tablet 3   rosuvastatin  (CRESTOR ) 20 MG tablet TAJE 1 TABLET BY MOUTH SUN TUES THURS SAT FOR CHOLESTEROL 48 tablet 0   triamterene -hydrochlorothiazide  (MAXZIDE -25) 37.5-25 MG tablet Take 1 tablet by mouth daily.  Patient must attend upcoming appointment for future refills 60 tablet 0   No current facility-administered medications on file prior to visit.     Psychiatric History: Past psychiatry diagnosis: MDD, Grief Patient currently being seen by therapist/psychiatrist: No  Prior Suicide Attempts: Denies Past psychiatry Hospitalization(s): Denies Past history of violence: Denies  Traumatic Experiences: History or current traumatic events (natural disaster, house fire, etc.)? no History or current physical trauma?  no History or current emotional trauma?  no History or current sexual trauma?  no History or current domestic or intimate partner violence?  no PTSD symptoms if any traumatic experiences no (Details: )  Alcohol and/or Substance Use History   Tobacco Alcohol Other substances  Current use  Once every couple of months Denies  Past use     Past treatment      Withdrawal Potential:   Self-harm Behaviors Risk Assessment Self-harm risk factors: [place] Patient endorses recent thoughts of harming self:   [place] Suicide Severity Rating Scale:   Guns in the home: no   Protective factors: family support   Danger to Others Risk Assessment Danger to others risk factors:   Patient endorses recent thoughts of harming others:   Dynamic Appraisal of Situational Aggression (DASA):   BH Counselor discussed emergency crisis plan with client and provided local emergency services resources.  Mental status exam:   General Appearance Jade Singh:  Casual Eye Contact:  Good Motor Behavior:  Normal Speech:  Normal Level of Consciousness:  Alert Mood:  Depressed Affect:  Depressed Anxiety Level:  Minimal Thought Process:  Coherent Thought Content:  WNL Perception:  Normal Judgment:  Good Insight:  Present  Diagnosis: Adjustment Disorder   Goals: Increase healthy adjustment to current life circumstances   Interventions: Mindfulness or Relaxation Training, Behavioral Activation, and Medication Monitoring   Follow-up Plan: Refer to Psychiatrist for Medication Management

## 2023-12-21 ENCOUNTER — Other Ambulatory Visit: Payer: Self-pay | Admitting: Family Medicine

## 2023-12-21 DIAGNOSIS — G4700 Insomnia, unspecified: Secondary | ICD-10-CM

## 2023-12-21 DIAGNOSIS — F419 Anxiety disorder, unspecified: Secondary | ICD-10-CM

## 2023-12-26 NOTE — Patient Instructions (Signed)
 If your symptoms worsen or you have thoughts of suicide/homicide, PLEASE SEEK IMMEDIATE MEDICAL ATTENTION.  You may always call:   National Suicide Hotline: 988 or (913)195-1115 [place] Crisis Line: 343-675-8477 Crisis Recovery in [place]: [phone]     These are available 24 hours a day, 7 days a week.

## 2023-12-28 ENCOUNTER — Telehealth (INDEPENDENT_AMBULATORY_CARE_PROVIDER_SITE_OTHER): Payer: Self-pay | Admitting: Professional Counselor

## 2023-12-28 DIAGNOSIS — F3341 Major depressive disorder, recurrent, in partial remission: Secondary | ICD-10-CM

## 2023-12-28 NOTE — BH Specialist Note (Signed)
 Virtual Behavioral Health Treatment Plan Team Note  MRN: [id number] NAME: Jade Singh  DATE: 12/29/23  Start time: Start Time: 1046 End time: Stop Time: 1054 Total time: Total Time in Minutes (Visit): 8  Total number of Virtual BH Treatment Team Plan encounters: 1/4  Treatment Team Attendees: Dr. Matias Soman and Delrae Field  Attestation signed by Elesa Grills, MD at 12/28/2023 10:55 AM   Collaborative Care Psychiatric Consultant Case Review    Assessment/Provisional Diagnosis Jade Singh is a 73 y.o. year old female with history of COPD, Depression, HLD, HTN. The patient is referred for Depression and Insomnia.   # MDD, Recurrent, Moderate. # Insomnia   Recommendation Referral to therapy for CBT-i Continue Wellbutrin  150 XL Start Mirtazapine 7.5 mg at bedtime Refer to psychiatry for higher level of care [facility] specialist to follow up.     Thank you for your consult. Please contact our collaborative care team for any questions or concerns.    I spent 20 minutes chart reviewing, discussing with Mr. Toni Frank and documenting in the chart.    The above treatment considerations and suggestions are based on consultation with the [facility] specialist and/or PCP and a review of information available in the shared registry and the patient's Electronic Health Record (EHR). I have not personally examined the patient. All recommendations should be implemented with consideration of the patient's relevant prior history and current clinical status. Please feel free to call me with any questions about the care of this patient. Diagnoses:    ICD-10-CM   1. Recurrent major depressive disorder, in partial remission (HCC)  F33.41       Goals, Interventions and Follow-up Plan Goals: Increase healthy adjustment to current life circumstances Interventions: Mindfulness or Relaxation Training Behavioral Activation Medication Monitoring Medication Management Recommendations: Recommend Mirtazapine  7.5  Follow-up Plan: Refer to Psychiatrist for Medication Management recommending CBT-I for therapy.  History of the present illness Presenting Problem/Current Symptoms:  The patient is a 73 year old female who presented for a collaborative care assessment primarily due to ongoing grief and sleep difficulties. She reports that her husband passed away unexpectedly in 2023/10/13, and she has struggled to adjust to the loss. In addition to this recent bereavement, she also experienced the loss of both of her parents a few years ago, contributing to compounding grief. She describes longstanding issues with depression and anxiety, as well as chronic pain in her neck and back, which further complicate her ability to rest. The patient currently reports difficulty falling asleep and staying asleep, along with feelings of loneliness and isolation. She is retired and has noticed a decrease in her appetite since her husband's passing.   She has a history of insomnia and was previously treated with Ambien  for many years before her provider discontinued it. She was later prescribed Valium , which she reports was highly effective, but she has been unable to obtain a continued prescription. Most recently, she was prescribed hydroxyzine  by her new primary care provider, which she says is moderately helpful but not as effective as Valium . She denies any history of bipolar disorder, suicidality, trauma, psychiatric hospitalizations, or substance use issues. She describes having had a relatively good life overall and is primarily seeking support during this difficult time. She is open to discussing traditional therapy or psychiatry and is hoping to find a medication regimen that better supports her sleep needs. A psychiatric consultation will be scheduled to evaluate medication options and overall mental health needs, with a follow-up visit planned in two  weeks.    Screenings PHQ-9 Assessments:     12/20/2023   10:16 AM  11/08/2023    8:24 AM 11/22/2022    2:40 PM  Depression screen PHQ 2/9  Decreased Interest 1 1 0  Down, Depressed, Hopeless 1 0 0  PHQ - 2 Score 2 1 0  Altered sleeping 2 0   Tired, decreased energy 1 1   Change in appetite 1 0   Feeling bad or failure about yourself  0 0   Trouble concentrating 0 0   Moving slowly or fidgety/restless 0 0   Suicidal thoughts 0 0   PHQ-9 Score 6 2   Difficult doing work/chores Not difficult at all Not difficult at all    GAD-7 Assessments:     12/20/2023   10:17 AM 11/08/2023    8:24 AM  GAD 7 : Generalized Anxiety Score  Nervous, Anxious, on Edge 1 1  Control/stop worrying 1 0  Worry too much - different things 1 1  Trouble relaxing 0 0  Restless 0 1  Easily annoyed or irritable 0 0  Afraid - awful might happen 0 1  Total GAD 7 Score 3 4  Anxiety Difficulty Somewhat difficult Not difficult at all    Past Medical History Past Medical History:  Diagnosis Date   ANAL FISSURE, HX OF 11/06/2007   Qualifier: Diagnosis of  By: Lewellyn CMA (AAMA), Amanda     Anxiety    Depression    Difficulty sleeping    takes Ambien    Diverticulosis 5/1/5   DJD (degenerative joint disease)    Family history of anesthesia complication    mother quit breathing 30 yrs ago pt does not know any more details. 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  Allergies:  Allergies as of 12/28/2023 - Review Complete 11/08/2023  Allergen Reaction Noted   Lexapro  [escitalopram  oxalate] Palpitations 10/29/2020   Doxycycline Other (See Comments) 07/06/2018   Levaquin [levofloxacin in d5w]  06/06/2013   Oruvail [ketoprofen] Nausea Only 06/06/2013    Medication  History Current medications:  Outpatient Encounter Medications as of 12/28/2023  Medication Sig   albuterol  (VENTOLIN  HFA) 108 (90 Base) MCG/ACT inhaler Inhale 2 puffs into the lungs every 6 (six) hours as needed for wheezing or shortness of breath.   aspirin  81 MG tablet Take 81 mg by mouth daily.   Budeson-Glycopyrrol-Formoterol (BREZTRI  AEROSPHERE) 160-9-4.8 MCG/ACT AERO Inhale 2 puffs into the lungs in the morning and at bedtime.   buPROPion  (WELLBUTRIN  XL) 150 MG 24 hr tablet TAKE ONE TABLET BY MOUTH EVERY MORNING   calcipotriene (DOVONOX) 0.005 % ointment Apply 1 application topically as needed (Hands).   Cholecalciferol  (VITAMIN D3) 5000 UNITS CAPS Take 5,000 Units by mouth 4 (four) times a week. Takes M,W,TH,F   ezetimibe  (ZETIA ) 10 MG tablet TAKE 1 TABLET BY MOUTH DAILY   hydrOXYzine  (ATARAX ) 10 MG tablet Take  1 to 3 tablets  at Bedtime  as needed for Sleep                                 /  TAKE                                         BY                                                 MOUTH   Magnesium 250 MG TABS Take 250 mg by mouth daily.   Multiple Vitamins-Minerals (MULTIVITAMIN WITH MINERALS) tablet Take 1 tablet by mouth daily.   nitroGLYCERIN  (NITROSTAT ) 0.4 MG SL tablet Place 1 tablet (0.4 mg total) under the tongue every 5 (five) minutes as needed for chest pain.   pantoprazole  (PROTONIX ) 40 MG tablet TAKE 1 TABLET DAILY TO PREVENT INDIGESTION & HEARTBURN   rosuvastatin  (CRESTOR ) 20 MG tablet TAJE 1 TABLET BY MOUTH SUN TUES THURS SAT FOR CHOLESTEROL   triamterene -hydrochlorothiazide  (MAXZIDE -25) 37.5-25 MG tablet Take 1 tablet by mouth daily. Patient must attend upcoming appointment for future refills   No facility-administered encounter medications on file as of 12/28/2023.     Scribe for Treatment Team: Regina Capes

## 2024-01-03 ENCOUNTER — Ambulatory Visit: Admitting: Professional Counselor

## 2024-01-03 DIAGNOSIS — F3341 Major depressive disorder, recurrent, in partial remission: Secondary | ICD-10-CM | POA: Diagnosis not present

## 2024-01-03 NOTE — BH Specialist Note (Signed)
 [facility] Follow-up  MRN: [id number] NAME: Jade REDDITT Date: 01/03/24  Start time: Start Time: 1030 End time: Stop Time: 1100 Total time: Total Time in Minutes (Visit): 30 Call number: Visit Number: 3- Third Visit  Reason for call today:  Patient is a 73 year old female who presented for a collaborative care follow-up. She appeared in a relatively positive mood and reported feeling somewhat better about her situation. She shared that she has begun to accept that she may not be able to obtain the specific medication she had previously desired.  The patient continues to experience sleep disruptions and ongoing difficulty processing the loss of her husband. During the session, we reviewed the findings from her recent psychiatric consultation. The psychiatrist recommended that the most appropriate course of treatment would include face-to-face psychiatric care and engagement in traditional therapy.  While the patient expressed some hesitation about seeing a psychiatrist--due to concern that she will not be prescribed the medication she prefers--she is open to trying the psychiatrist's recommendation to initiate mirtazapine 7.5 mg at bedtime. She agreed to this plan and will begin the medication trial.  We will continue to monitor her symptoms and provide bridge support through collaborative care services until she is fully connected to both psychiatry and traditional therapy.   PHQ-9 Scores:     12/20/2023   10:16 AM 11/08/2023    8:24 AM 11/22/2022    2:40 PM 08/16/2022   12:37 AM 03/11/2022    2:51 PM  Depression screen PHQ 2/9  Decreased Interest 1 1 0 0 0  Down, Depressed, Hopeless 1 0 0 0 0  PHQ - 2 Score 2 1 0 0 0  Altered sleeping 2 0     Tired, decreased energy 1 1     Change in appetite 1 0     Feeling bad or failure about yourself  0 0     Trouble concentrating 0 0     Moving slowly or fidgety/restless 0 0     Suicidal thoughts 0 0     PHQ-9 Score 6 2     Difficult doing  work/chores Not difficult at all Not difficult at all      GAD-7 Scores:     12/20/2023   10:17 AM 11/08/2023    8:24 AM  GAD 7 : Generalized Anxiety Score  Nervous, Anxious, on Edge 1 1  Control/stop worrying 1 0  Worry too much - different things 1 1  Trouble relaxing 0 0  Restless 0 1  Easily annoyed or irritable 0 0  Afraid - awful might happen 0 1  Total GAD 7 Score 3 4  Anxiety Difficulty Somewhat difficult Not difficult at all    Stress Current stressors:  Sleep disruptions Sleep:  No Appetite:  fair Coping ability:  Good Patient taking medications as prescribed:  Yes  Current medications:  Outpatient Encounter Medications as of 01/03/2024  Medication Sig   albuterol  (VENTOLIN  HFA) 108 (90 Base) MCG/ACT inhaler Inhale 2 puffs into the lungs every 6 (six) hours as needed for wheezing or shortness of breath.   aspirin  81 MG tablet Take 81 mg by mouth daily.   Budeson-Glycopyrrol-Formoterol (BREZTRI  AEROSPHERE) 160-9-4.8 MCG/ACT AERO Inhale 2 puffs into the lungs in the morning and at bedtime.   buPROPion  (WELLBUTRIN  XL) 150 MG 24 hr tablet TAKE ONE TABLET BY MOUTH EVERY MORNING   calcipotriene (DOVONOX) 0.005 % ointment Apply 1 application topically as needed (Hands).   Cholecalciferol  (VITAMIN D3) 5000 UNITS CAPS  Take 5,000 Units by mouth 4 (four) times a week. Takes M,W,TH,F   ezetimibe  (ZETIA ) 10 MG tablet TAKE 1 TABLET BY MOUTH DAILY   hydrOXYzine  (ATARAX ) 10 MG tablet Take  1 to 3 tablets  at Bedtime  as needed for Sleep                                 /                                                                   TAKE                                         BY                                                 MOUTH   Magnesium 250 MG TABS Take 250 mg by mouth daily.   Multiple Vitamins-Minerals (MULTIVITAMIN WITH MINERALS) tablet Take 1 tablet by mouth daily.   nitroGLYCERIN  (NITROSTAT ) 0.4 MG SL tablet Place 1 tablet (0.4 mg total) under the tongue every 5 (five)  minutes as needed for chest pain.   pantoprazole  (PROTONIX ) 40 MG tablet TAKE 1 TABLET DAILY TO PREVENT INDIGESTION & HEARTBURN   rosuvastatin  (CRESTOR ) 20 MG tablet TAJE 1 TABLET BY MOUTH SUN TUES THURS SAT FOR CHOLESTEROL   triamterene -hydrochlorothiazide  (MAXZIDE -25) 37.5-25 MG tablet Take 1 tablet by mouth daily. Patient must attend upcoming appointment for future refills   No facility-administered encounter medications on file as of 01/03/2024.     Self-harm Behaviors Risk Assessment Self-harm risk factors:   Patient endorses recent thoughts of harming self:    [place] Suicide Severity Rating Scale: Failed to redirect to the Timeline version of the REVFS SmartLink.   Danger to Others Risk Assessment Danger to others risk factors:   Patient endorses recent thoughts of harming others:    Dynamic Appraisal of Situational Aggression (DASA):      No data to display           Substance Use Assessment Patient recently consumed alcohol:  yes  Alcohol Use Disorder Identification Test (AUDIT):     11/05/2023    7:36 PM  Alcohol Use Disorder Test (AUDIT)  1. How often do you have a drink containing alcohol? 1  2. How many drinks containing alcohol do you have on a typical day when you are drinking? 0  3. How often do you have six or more drinks on one occasion? 0  AUDIT-C Score 1      Patient-reported    Goals, Interventions and Follow-up Plan Goals: Increase healthy adjustment to current life circumstances Interventions: Behavioral Activation Follow-up Plan: Refer to [facility] Outpatient Therapy    Regina Capes

## 2024-01-10 ENCOUNTER — Ambulatory Visit: Admitting: Family Medicine

## 2024-01-11 ENCOUNTER — Ambulatory Visit (INDEPENDENT_AMBULATORY_CARE_PROVIDER_SITE_OTHER): Admitting: Family Medicine

## 2024-01-11 ENCOUNTER — Encounter: Payer: Self-pay | Admitting: Family Medicine

## 2024-01-11 VITALS — BP 144/83 | HR 78 | Temp 98.3°F | Ht 65.0 in | Wt 228.0 lb

## 2024-01-11 DIAGNOSIS — R944 Abnormal results of kidney function studies: Secondary | ICD-10-CM

## 2024-01-11 DIAGNOSIS — I10 Essential (primary) hypertension: Secondary | ICD-10-CM | POA: Diagnosis not present

## 2024-01-11 DIAGNOSIS — F419 Anxiety disorder, unspecified: Secondary | ICD-10-CM | POA: Diagnosis not present

## 2024-01-11 DIAGNOSIS — G4700 Insomnia, unspecified: Secondary | ICD-10-CM | POA: Diagnosis not present

## 2024-01-11 DIAGNOSIS — F324 Major depressive disorder, single episode, in partial remission: Secondary | ICD-10-CM

## 2024-01-11 DIAGNOSIS — Z0000 Encounter for general adult medical examination without abnormal findings: Secondary | ICD-10-CM

## 2024-01-11 DIAGNOSIS — Z23 Encounter for immunization: Secondary | ICD-10-CM

## 2024-01-11 DIAGNOSIS — F4321 Adjustment disorder with depressed mood: Secondary | ICD-10-CM | POA: Diagnosis not present

## 2024-01-11 DIAGNOSIS — Z0001 Encounter for general adult medical examination with abnormal findings: Secondary | ICD-10-CM | POA: Diagnosis not present

## 2024-01-11 DIAGNOSIS — Z1283 Encounter for screening for malignant neoplasm of skin: Secondary | ICD-10-CM

## 2024-01-11 MED ORDER — MIRTAZAPINE 7.5 MG PO TABS
7.5000 mg | ORAL_TABLET | Freq: Every day | ORAL | 1 refills | Status: DC
Start: 2024-01-11 — End: 2024-01-12

## 2024-01-11 MED ORDER — HYDROXYZINE HCL 10 MG PO TABS
ORAL_TABLET | ORAL | 0 refills | Status: DC
Start: 2024-01-11 — End: 2024-01-12

## 2024-01-11 NOTE — Patient Instructions (Addendum)
 Monitoring your BP at home   Your BP was elevated today in office  Please keep a log of your BP at home.  The [place] time to take BP is 1st thing in the morning after waking.  Sit for 5 minutes with feet flat on the floor, arm at heart level.  Options for BP cuffs are at [employer], [employer], [employer], [facility].  We will review measurements at follow up and determine plan for BP management.  If you have access, you can send a message in MyChart with your measurements prior to your follow up appointment.  The brand I recommend to get is Omron (Bronze).     Health Maintenance, Female Adopting a healthy lifestyle and getting preventive care are important in promoting health and wellness. Ask your health care provider about: The right schedule for you to have regular tests and exams. Things you can do on your own to prevent diseases and keep yourself healthy. What should I know about diet, weight, and exercise? Eat a healthy diet  Eat a diet that includes plenty of vegetables, fruits, low-fat dairy products, and lean protein. Do not eat a lot of foods that are high in solid fats, added sugars, or sodium. Maintain a healthy weight Body mass index (BMI) is used to identify weight problems. It estimates body fat based on height and weight. Your health care provider can help determine your BMI and help you achieve or maintain a healthy weight. Get regular exercise Get regular exercise. This is one of the most important things you can do for your health. Most adults should: Exercise for at least 150 minutes each week. The exercise should increase your heart rate and make you sweat (moderate-intensity exercise). Do strengthening exercises at least twice a week. This is in addition to the moderate-intensity exercise. Spend less time sitting. Even light physical activity can be beneficial. Watch cholesterol and blood lipids Have your blood tested for lipids and cholesterol at 72 years of age, then  have this test every 5 years. Have your cholesterol levels checked more often if: Your lipid or cholesterol levels are high. You are older than 72 years of age. You are at high risk for heart disease. What should I know about cancer screening? Depending on your health history and family history, you may need to have cancer screening at various ages. This may include screening for: Breast cancer. Cervical cancer. Colorectal cancer. Skin cancer. Lung cancer. What should I know about heart disease, diabetes, and high blood pressure? Blood pressure and heart disease High blood pressure causes heart disease and increases the risk of stroke. This is more likely to develop in people who have high blood pressure readings or are overweight. Have your blood pressure checked: Every 3-5 years if you are 21-61 years of age. Every year if you are 62 years old or older. Diabetes Have regular diabetes screenings. This checks your fasting blood sugar level. Have the screening done: Once every three years after age 3 if you are at a normal weight and have a low risk for diabetes. More often and at a younger age if you are overweight or have a high risk for diabetes. What should I know about preventing infection? Hepatitis B If you have a higher risk for hepatitis B, you should be screened for this virus. Talk with your health care provider to find out if you are at risk for hepatitis B infection. Hepatitis C Testing is recommended for: Everyone born from [birth year] through  30. Anyone with known risk factors for hepatitis C. Sexually transmitted infections (STIs) Get screened for STIs, including gonorrhea and chlamydia, if: You are sexually active and are younger than 72 years of age. You are older than 72 years of age and your health care provider tells you that you are at risk for this type of infection. Your sexual activity has changed since you were last screened, and you are at increased risk for  chlamydia or gonorrhea. Ask your health care provider if you are at risk. Ask your health care provider about whether you are at high risk for HIV. Your health care provider may recommend a prescription medicine to help prevent HIV infection. If you choose to take medicine to prevent HIV, you should first get tested for HIV. You should then be tested every 3 months for as long as you are taking the medicine. Pregnancy If you are about to stop having your period (premenopausal) and you may become pregnant, seek counseling before you get pregnant. Take 400 to 800 micrograms (mcg) of folic acid every day if you become pregnant. Ask for birth control (contraception) if you want to prevent pregnancy. Osteoporosis and menopause Osteoporosis is a disease in which the bones lose minerals and strength with aging. This can result in bone fractures. If you are 76 years old or older, or if you are at risk for osteoporosis and fractures, ask your health care provider if you should: Be screened for bone loss. Take a calcium  or vitamin D  supplement to lower your risk of fractures. Be given hormone replacement therapy (HRT) to treat symptoms of menopause. Follow these instructions at home: Alcohol use Do not drink alcohol if: Your health care provider tells you not to drink. You are pregnant, may be pregnant, or are planning to become pregnant. If you drink alcohol: Limit how much you have to: 0-1 drink a day. Know how much alcohol is in your drink. In the [place] one drink equals one 12 oz bottle of beer (355 mL), one 5 oz glass of wine (148 mL), or one 1 oz glass of hard liquor (44 mL). Lifestyle Do not use any products that contain nicotine or tobacco. These products include cigarettes, chewing tobacco, and vaping devices, such as e-cigarettes. If you need help quitting, ask your health care provider. Do not use street drugs. Do not share needles. Ask your health care provider for help if you need support  or information about quitting drugs. General instructions Schedule regular health, dental, and eye exams. Stay current with your vaccines. Tell your health care provider if: You often feel depressed. You have ever been abused or do not feel safe at home. Summary Adopting a healthy lifestyle and getting preventive care are important in promoting health and wellness. Follow your health care provider's instructions about healthy diet, exercising, and getting tested or screened for diseases. Follow your health care provider's instructions on monitoring your cholesterol and blood pressure. This information is not intended to replace advice given to you by your health care provider. Make sure you discuss any questions you have with your health care provider. Document Revised: 11/24/2020 Document Reviewed: 11/24/2020 Elsevier Patient Education  2024 [employer] Colonoscopy, Adult A colonoscopy is a procedure to look at the entire large intestine. This procedure is done using a long, thin, flexible tube that has a camera on the end. You may have a colonoscopy: As a part of normal colorectal screening. If you have certain symptoms, such as: A low number  of red blood cells in your blood (anemia). Diarrhea that does not go away. Pain in your abdomen. Blood in your stool. A colonoscopy can help screen for and diagnose medical problems, including: An abnormal growth of cells or tissue (tumor). Abnormal growths within the lining of your intestine (polyps). Inflammation. Areas of bleeding. Tell your health care provider about: Any allergies you have. All medicines you are taking, including vitamins, herbs, eye drops, creams, and over-the-counter medicines. Any problems you or family members have had with anesthetic medicines. Any bleeding problems you have. Any surgeries you have had. Any medical conditions you have. Any problems you have had with having bowel movements. Whether you are  pregnant or may be pregnant. What are the risks? Generally, this is a safe procedure. However, problems may occur, including: Bleeding. Damage to your intestine. Allergic reactions to medicines given during the procedure. Infection. This is rare. What happens before the procedure? Eating and drinking restrictions Follow instructions from your health care provider about eating or drinking restrictions, which may include: A few days before the procedure: Follow a low-fiber diet. Avoid nuts, seeds, dried fruit, raw fruits, and vegetables. 1-3 days before the procedure: Eat only gelatin dessert or ice pops. Drink only clear liquids, such as water, clear juice, clear broth or bouillon, black coffee or tea, or clear soft drinks or sports drinks. Avoid liquids that contain red or purple dye. The day of the procedure: Do not eat solid foods. You may continue to drink clear liquids until up to 2 hours before the procedure. Do not eat or drink anything starting 2 hours before the procedure, or within the time period that your health care provider recommends. Bowel prep If you were prescribed a bowel prep to take by mouth (orally) to clean out your colon: Take it as told by your health care provider. Starting the day before your procedure, you will need to drink a large amount of liquid medicine. The liquid will cause you to have many bowel movements of loose stool until your stool becomes almost clear or light green. If your skin or the opening between the buttocks (anus) gets irritated from diarrhea, you may relieve the irritation using: Wipes with medicine in them, such as adult wet wipes with aloe and vitamin E. A product to soothe skin, such as petroleum jelly. If you vomit while drinking the bowel prep: Take a break for up to 60 minutes. Begin the bowel prep again. Call your health care provider if you keep vomiting or you cannot take the bowel prep without vomiting. To clean out your colon,  you may also be given: Laxative medicines. These help you have a bowel movement. Instructions for enema use. An enema is liquid medicine injected into your rectum. Medicines Ask your health care provider about: Changing or stopping your regular medicines or supplements. This is especially important if you are taking iron supplements, diabetes medicines, or blood thinners. Taking medicines such as aspirin  and ibuprofen. These medicines can thin your blood. Do not take these medicines unless your health care provider tells you to take them. Taking over-the-counter medicines, vitamins, herbs, and supplements. General instructions Ask your health care provider what steps will be taken to help prevent infection. These may include washing skin with a germ-killing soap. If you will be going home right after the procedure, plan to have a responsible adult: Take you home from the hospital or clinic. You will not be allowed to drive. Care for you for the time you  are told. What happens during the procedure?  An IV will be inserted into one of your veins. You will be given a medicine to make you fall asleep (general anesthetic). You will lie on your side with your knees bent. A lubricant will be put on the tube. Then the tube will be: Inserted into your anus. Gently eased through all parts of your large intestine. Air will be sent into your colon to keep it open. This may cause some pressure or cramping. Images will be taken with the camera and will appear on a screen. A small tissue sample may be removed to be looked at under a microscope (biopsy). The tissue may be sent to a lab for testing if any signs of problems are found. If small polyps are found, they may be removed and checked for cancer cells. When the procedure is finished, the tube will be removed. The procedure may vary among health care providers and hospitals. What happens after the procedure? Your blood pressure, heart rate,  breathing rate, and blood oxygen level will be monitored until you leave the hospital or clinic. You may have a small amount of blood in your stool. You may pass gas and have mild cramping or bloating in your abdomen. This is caused by the air that was used to open your colon during the exam. If you were given a sedative during the procedure, it can affect you for several hours. Do not drive or operate machinery until your health care provider says that it is safe. It is up to you to get the results of your procedure. Ask your health care provider, or the department that is doing the procedure, when your results will be ready. Summary A colonoscopy is a procedure to look at the entire large intestine. Follow instructions from your health care provider about eating and drinking before the procedure. If you were prescribed an oral bowel prep to clean out your colon, take it as told by your health care provider. During the colonoscopy, a flexible tube with a camera on its end is inserted into the anus and then passed into all parts of the large intestine. This information is not intended to replace advice given to you by your health care provider. Make sure you discuss any questions you have with your health care provider. Document Revised: 08/17/2022 Document Reviewed: 02/25/2021 Elsevier Patient Education  2024 Elsevier Inc.    Why follow it? Research shows. Those who follow the Mediterranean diet have a reduced risk of heart disease  The diet is associated with a reduced incidence of Parkinson's and Alzheimer's diseases People following the diet may have longer life expectancies and lower rates of chronic diseases  The Dietary Guidelines for Americans recommends the Mediterranean diet as an eating plan to promote health and prevent disease  What Is the Mediterranean Diet?  Healthy eating plan based on typical foods and recipes of Mediterranean-style cooking The diet is primarily a plant based  diet; these foods should make up a majority of meals   Starches - Plant based foods should make up a majority of meals - They are an important sources of vitamins, minerals, energy, antioxidants, and fiber - Choose whole grains, foods high in fiber and minimally processed items  - Typical grain sources include wheat, oats, barley, corn, brown rice, bulgar, farro, millet, polenta, couscous  - Various types of beans include chickpeas, lentils, fava beans, black beans, white beans   Fruits  Veggies - Large quantities of antioxidant  rich fruits & veggies; 6 or more servings  - Vegetables can be eaten raw or lightly drizzled with oil and cooked  - Vegetables common to the traditional Mediterranean Diet include: artichokes, arugula, beets, broccoli, brussel sprouts, cabbage, carrots, celery, collard greens, cucumbers, eggplant, kale, leeks, lemons, lettuce, mushrooms, okra, onions, peas, peppers, potatoes, pumpkin, radishes, rutabaga, shallots, spinach, sweet potatoes, turnips, zucchini - Fruits common to the Mediterranean Diet include: apples, apricots, avocados, cherries, clementines, dates, figs, grapefruits, grapes, melons, nectarines, oranges, peaches, pears, pomegranates, strawberries, tangerines  Fats - Replace butter and margarine with healthy oils, such as olive oil, canola oil, and tahini  - Limit nuts to no more than a handful a day  - Nuts include walnuts, almonds, pecans, pistachios, pine nuts  - Limit or avoid candied, honey roasted or heavily salted nuts - Olives are central to the [employer] - can be eaten whole or used in a variety of dishes   Meats Protein - Limiting red meat: no more than a few times a month - When eating red meat: choose lean cuts and keep the portion to the size of deck of cards - Eggs: approx. 0 to 4 times a week  - Fish and lean poultry: at least 2 a week  - Healthy protein sources include, chicken, [place], lean beef, lamb - Increase intake of seafood  such as tuna, salmon, trout, mackerel, shrimp, scallops - Avoid or limit high fat processed meats such as sausage and bacon  Dairy - Include moderate amounts of low fat dairy products  - Focus on healthy dairy such as fat free yogurt, skim milk, low or reduced fat cheese - Limit dairy products higher in fat such as whole or 2% milk, cheese, ice cream  Alcohol - Moderate amounts of red wine is ok  - No more than 5 oz daily for women (all ages) and men older than age 65  - No more than 10 oz of wine daily for men younger than 49  Other - Limit sweets and other desserts  - Use herbs and spices instead of salt to flavor foods  - Herbs and spices common to the traditional Mediterranean Diet include: basil, bay leaves, chives, cloves, cumin, fennel, garlic, lavender, marjoram, mint, oregano, parsley, pepper, rosemary, sage, savory, sumac, tarragon, thyme   It's not just a diet, it's a lifestyle:  The Mediterranean diet includes lifestyle factors typical of those in the region  Foods, drinks and meals are [place] eaten with others and savored Daily physical activity is important for overall good health This could be strenuous exercise like running and aerobics This could also be more leisurely activities such as walking, housework, yard-work, or taking the stairs Moderation is the key; a balanced and healthy diet accommodates most foods and drinks Consider portion sizes and frequency of consumption of certain foods   Meal Ideas & Options:  Breakfast:  Whole wheat toast or whole wheat English muffins with peanut butter & hard boiled egg Steel cut oats topped with apples & cinnamon and skim milk  Fresh fruit: banana, strawberries, melon, berries, peaches  Smoothies: strawberries, bananas, greek yogurt, peanut butter Low fat greek yogurt with blueberries and granola  Egg white omelet with spinach and mushrooms Breakfast couscous: whole wheat couscous, apricots, skim milk, cranberries  Sandwiches:   Hummus and grilled vegetables (peppers, zucchini, squash) on whole wheat bread   Grilled chicken on whole wheat pita with lettuce, tomatoes, cucumbers or tzatziki  [place] salad on whole wheat bread: tuna  salad made with greek yogurt, olives, red peppers, capers, green onions Garlic rosemary lamb pita: lamb sauted with garlic, rosemary, salt & pepper; add lettuce, cucumber, greek yogurt to pita - flavor with lemon juice and black pepper  Seafood:  Mediterranean grilled salmon, seasoned with garlic, basil, parsley, lemon juice and black pepper Shrimp, lemon, and spinach whole-grain pasta salad made with low fat greek yogurt  Seared scallops with lemon orzo  Seared tuna steaks seasoned salt, pepper, coriander topped with tomato mixture of olives, tomatoes, olive oil, minced garlic, parsley, green onions and cappers  Meats:  Herbed greek chicken salad with kalamata olives, cucumber, feta  Red bell peppers stuffed with spinach, bulgur, lean ground beef (or lentils) & topped with feta   Kebabs: skewers of chicken, tomatoes, onions, zucchini, squash  [place] burgers: made with red onions, mint, dill, lemon juice, feta cheese topped with roasted red peppers Vegetarian Cucumber salad: cucumbers, artichoke hearts, celery, red onion, feta cheese, tossed in olive oil & lemon juice  Hummus and whole grain pita points with a greek salad (lettuce, tomato, feta, olives, cucumbers, red onion) Lentil soup with celery, carrots made with vegetable broth, garlic, salt and pepper  Tabouli salad: parsley, bulgur, mint, scallions, cucumbers, tomato, radishes, lemon juice, olive oil, salt and pepper.

## 2024-01-11 NOTE — Progress Notes (Signed)
 Jade Singh is a 73 y.o. female presents to office today for annual physical exam examination.    Concerns today include: 1. Recently established with BH.  Recommended to start mirtazapine 7.5 mg at night. She has been referred to psychiatry for further management, but she is reluctant to go. Denies SI.  She takes hydroxyzine  for sleep as needed. She does not take it nightly. She was previously taking 10 -30 mg.  States that she was previously taking valium  and that was working well for her, but that she is okay with current regimen.   BP is mildly elevated.  She has a monitor at home. She checks it sometimes and averages 130s SBP.  She has follow up with cardiology in July.  Denies fatigue, peripheral edema, changes to vision, chest pain, headaches, palpitations, sweats, SOB, PND, orthopnea  Decreased GFR  States that she is drinking a lot of water. She has not established with nephrology in that past. She is taking hydrochlorothiazide .   CPE  Occupation: Retired from [employer]  Marital status: widowed  Diet: states that right now it is very limited.  Egg, bacon, toast at breakfast. Does not eat formal meals after that. TV dinners, salads. Sometimes cooks.  Exercise: none  Substance use: former smoker, quit 2000  Wine on occasion  Last eye exam: due  Last dental exam: UTD - going back on July 3rd.  Last colonoscopy: states that she completed cologuard through [facility] last year and it was normal.  Last mammogram: due in July. Has letter to make her appt.  Last pap smear: aged out  Contraceptive: post menopausal  Refills needed today: hydroxyzine   Other specialists seen: psychiatry Cardiology Orthopedics  Pulmonology  Dermatology exam: interested if covered  Fasting today:  no  Immunizations needed: Flu Vaccine: no  Tdap Vaccine: no  - every 77yrs - (<3 lifetime doses or unknown): all wounds -- look up need for Tetanus IG - (>=3 lifetime doses): clean/minor wound  if >73yrs from previous; all other wounds if >53yrs from previous Zoster Vaccine: yes (those >50yo, once) Pneumonia Vaccine: no (those w/ risk factors) - (<6yr) Both: Immunocompromised, cochlear implant, CSF leak, asplenic, sickle cell, Chronic Renal Failure - (<33yr) PPSV-23 only: Heart dz, lung disease, DM, tobacco abuse, alcoholism, cirrhosis/liver disease. - (>30yr): PPSV13 then PPSV23 in 6-12mths;  - (>66yr): repeat PPSV23 once if pt received prior to 72yo and 80yrs have passed   Past Medical History:  Diagnosis Date   ANAL FISSURE, HX OF 11/06/2007   Qualifier: Diagnosis of  By: Earlean CMA (AAMA), Amanda     Anxiety    Depression    Difficulty sleeping    takes Ambien    Diverticulosis 11/17/2003   DJD (degenerative joint disease)    Family history of anesthesia complication    mother quit breathing 30 yrs ago pt does not know any more details. 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   Unspecified vitamin D  deficiency    Urgency of urination    Social History   Socioeconomic History   Marital status: Married    Spouse name: Not on file   Number of children: Not on file   Years of education: Not on file  Highest education level: 12th grade  Occupational History   Not on file  Tobacco Use   Smoking status: Former    Current packs/day: 0.00    Average packs/day: 3.0 packs/day for 29.0 years (87.0 ttl pk-yrs)    Types: Cigarettes    Start date: 07/19/1969    Quit date: 07/19/1998    Years since quitting: 25.4   Smokeless tobacco: Never  Substance and Sexual Activity   Alcohol use: Yes    Alcohol/week: 1.0 standard drink of alcohol    Types: 1 Glasses of wine per week    Comment: occ   Drug use: No   Sexual activity: Not Currently  Other Topics Concern   Not on  file  Social History Narrative   Not on file   Social Drivers of Health   Financial Resource Strain: Low Risk  (01/09/2024)   Overall Financial Resource Strain (CARDIA)    Difficulty of Paying Living Expenses: Not hard at all  Food Insecurity: No Food Insecurity (01/09/2024)   Hunger Vital Sign    Worried About Running Out of Food in the Last Year: Never true    Ran Out of Food in the Last Year: Never true  Transportation Needs: No Transportation Needs (01/09/2024)   PRAPARE - Administrator, Civil Service (Medical): No    Lack of Transportation (Non-Medical): No  Physical Activity: Inactive (01/09/2024)   Exercise Vital Sign    Days of Exercise per Week: 0 days    Minutes of Exercise per Session: Not on file  Stress: No Stress Concern Present (01/09/2024)   [employer] of Occupational Health - Occupational Stress Questionnaire    Feeling of Stress: Only a little  Recent Concern: Stress - Stress Concern Present (11/05/2023)   [employer] of Occupational Health - Occupational Stress Questionnaire    Feeling of Stress : Rather much  Social Connections: Socially Isolated (01/09/2024)   Social Connection and Isolation Panel    Frequency of Communication with Friends and Family: More than three times a week    Frequency of Social Gatherings with Friends and Family: Three times a week    Attends Religious Services: Never    Active Member of Clubs or Organizations: No    Attends Banker Meetings: Not on file    Marital Status: Widowed  Intimate Partner Violence: Not At Risk (11/08/2023)   Humiliation, Afraid, Rape, and Kick questionnaire    Fear of Current or Ex-Partner: No    Emotionally Abused: No    Physically Abused: No    Sexually Abused: No   Past Surgical History:  Procedure Laterality Date   ABDOMINAL HYSTERECTOMY     partial   APPENDECTOMY     CARDIAC CATHETERIZATION  07/20/2003    no problems per pt    JOINT REPLACEMENT     KNEE  ARTHROSCOPY     RT 2011, LT 2009   LEFT HEART CATH AND CORONARY ANGIOGRAPHY N/A 05/11/2019   Procedure: LEFT HEART CATH AND CORONARY ANGIOGRAPHY;  Surgeon: Darron Deatrice LABOR, MD;  Location: [facility];  Service: Cardiovascular;  Laterality: N/A;   TONSILLECTOMY AND ADENOIDECTOMY     TOTAL KNEE ARTHROPLASTY Right 02/28/2014   Procedure: RIGHT TOTAL KNEE ARTHROPLASTY;  Surgeon: Reyes JAYSON Billing, MD;  Location: [facility];  Service: Orthopedics;  Laterality: Right;   TUBAL LIGATION     Family History  Problem Relation Age of Onset   Stroke Mother    Hyperlipidemia Mother  Heart attack Mother    Ataxia Mother        cerebellar ataxia 6   Cancer Mother    Alcohol abuse Father    Cancer Father    Cancer Brother 36       prostate, deceased   Heart attack Maternal Grandfather    Heart disease Paternal Grandfather    Diabetes Paternal Uncle    Ataxia Maternal Aunt    Ataxia Maternal Aunt    Colon cancer Neg Hx    Breast cancer Neg Hx     Current Outpatient Medications:    albuterol  (VENTOLIN  HFA) 108 (90 Base) MCG/ACT inhaler, Inhale 2 puffs into the lungs every 6 (six) hours as needed for wheezing or shortness of breath., Disp: 8 g, Rfl: 2   aspirin  81 MG tablet, Take 81 mg by mouth daily., Disp: , Rfl:    Budeson-Glycopyrrol-Formoterol (BREZTRI  AEROSPHERE) 160-9-4.8 MCG/ACT AERO, Inhale 2 puffs into the lungs in the morning and at bedtime., Disp: 10.7 g, Rfl: 2   buPROPion  (WELLBUTRIN  XL) 150 MG 24 hr tablet, TAKE ONE TABLET BY MOUTH EVERY MORNING, Disp: 90 tablet, Rfl: 3   calcipotriene (DOVONOX) 0.005 % ointment, Apply 1 application topically as needed (Hands)., Disp: , Rfl:    Cholecalciferol  (VITAMIN D3) 5000 UNITS CAPS, Take 5,000 Units by mouth 4 (four) times a week. Takes M,W,TH,F, Disp: , Rfl:    ezetimibe  (ZETIA ) 10 MG tablet, TAKE 1 TABLET BY MOUTH DAILY, Disp: 90 tablet, Rfl: 3   hydrOXYzine  (ATARAX ) 10 MG tablet, Take  1 to 3 tablets  at Bedtime  as needed for Sleep                                  /                                                                   TAKE                                         BY                                                 MOUTH, Disp: 270 tablet, Rfl: 0   Magnesium 250 MG TABS, Take 250 mg by mouth daily., Disp: , Rfl:    Multiple Vitamins-Minerals (MULTIVITAMIN WITH MINERALS) tablet, Take 1 tablet by mouth daily., Disp: , Rfl:    nitroGLYCERIN  (NITROSTAT ) 0.4 MG SL tablet, Place 1 tablet (0.4 mg total) under the tongue every 5 (five) minutes as needed for chest pain., Disp: 25 tablet, Rfl: 1   pantoprazole  (PROTONIX ) 40 MG tablet, TAKE 1 TABLET DAILY TO PREVENT INDIGESTION & HEARTBURN, Disp: 90 tablet, Rfl: 3   rosuvastatin  (CRESTOR ) 20 MG tablet, TAJE 1 TABLET BY MOUTH SUN TUES THURS SAT FOR CHOLESTEROL, Disp: 48 tablet, Rfl: 0   triamterene -hydrochlorothiazide  (MAXZIDE -25) 37.5-25 MG tablet, Take 1 tablet by mouth daily. Patient must attend upcoming appointment  for future refills, Disp: 60 tablet, Rfl: 0  Allergies  Allergen Reactions   Lexapro  [Escitalopram  Oxalate] Palpitations    Had wheezing and palpitations   Doxycycline Other (See Comments)    Jittery and causes nausea   Levaquin [Levofloxacin In D5w]     yeast   Oruvail [Ketoprofen] Nausea Only    GI upset     ROS: Review of Systems ROS  As per HPI   Physical exam    01/11/2024    1:53 PM 01/11/2024    1:48 PM 11/08/2023    8:20 AM  Vitals with BMI  Height  5' 5 5' 5  Weight  228 lbs 231 lbs  BMI  37.94 38.44  Systolic 144 149 863  Diastolic 83 83 82  Pulse  78 73    Physical Exam Constitutional:      General: She is awake. She is not in acute distress.    Appearance: Normal appearance. She is well-developed and well-groomed. She is obese. She is not ill-appearing, toxic-appearing or diaphoretic.     Interventions: She is not intubated. HENT:     Head:     Salivary Glands: Right salivary gland is not diffusely enlarged or tender. Left  salivary gland is not diffusely enlarged or tender.     Right Ear: There is impacted cerumen.     Left Ear: No laceration, drainage, swelling or tenderness.  No middle ear effusion. There is no impacted cerumen. No foreign body. No mastoid tenderness. No PE tube. No hemotympanum. Tympanic membrane is not injected, scarred, perforated, erythematous, retracted or bulging.     Nose: No nasal deformity, septal deviation, signs of injury, laceration, nasal tenderness, mucosal edema, congestion or rhinorrhea.     Right Sinus: No maxillary sinus tenderness or frontal sinus tenderness.     Left Sinus: No maxillary sinus tenderness or frontal sinus tenderness.   Eyes:     General: Lids are normal.     Extraocular Movements:     Right eye: Normal extraocular motion.     Left eye: Normal extraocular motion.     Conjunctiva/sclera:     Right eye: Right conjunctiva is not injected. No chemosis, exudate or hemorrhage.    Left eye: Left conjunctiva is not injected. No chemosis, exudate or hemorrhage.    Pupils: Pupils are equal, round, and reactive to light. Pupils are equal.     Right eye: Pupil is round, reactive and not sluggish. No corneal abrasion.     Left eye: Pupil is round, reactive and not sluggish. No corneal abrasion.     Funduscopic exam:    Right eye: No hemorrhage or exudate. Red reflex present.        Left eye: No hemorrhage or exudate. Red reflex present.  Neck:     Thyroid : No thyroid  mass or thyromegaly.     Trachea: Trachea and phonation normal. No tracheal tenderness, tracheostomy or tracheal deviation.   Cardiovascular:     Rate and Rhythm: Normal rate and regular rhythm.     Pulses:          Radial pulses are 2+ on the right side and 2+ on the left side.     Heart sounds: Normal heart sounds.  Pulmonary:     Effort: Pulmonary effort is normal. No tachypnea, bradypnea, accessory muscle usage, prolonged expiration, respiratory distress or retractions. She is not intubated.      Breath sounds: Normal breath sounds. No stridor, decreased air movement or transmitted upper airway  sounds. No decreased breath sounds, wheezing, rhonchi or rales.  Abdominal:     General: Abdomen is flat. Bowel sounds are normal. There is no distension or abdominal bruit. There are no signs of injury.     Palpations: Abdomen is soft. There is no shifting dullness, fluid wave, hepatomegaly, splenomegaly, mass or pulsatile mass.     Tenderness: There is no abdominal tenderness. There is no right CVA tenderness, left CVA tenderness, guarding or rebound.     Hernia: No hernia is present.   Musculoskeletal:     Cervical back: Full passive range of motion without pain, normal range of motion and neck supple. No edema, erythema, rigidity, torticollis or crepitus. No pain with movement. Normal range of motion.     Right lower leg: No edema.     Left lower leg: No edema.  Lymphadenopathy:     Head:     Right side of head: No submental, submandibular, tonsillar, preauricular or posterior auricular adenopathy.     Left side of head: No submental, submandibular, tonsillar, preauricular or posterior auricular adenopathy.     Cervical: No cervical adenopathy.     Right cervical: No superficial, deep or posterior cervical adenopathy.    Left cervical: No superficial, deep or posterior cervical adenopathy.   Skin:    General: Skin is warm.     Capillary Refill: Capillary refill takes less than 2 seconds.   Neurological:     General: No focal deficit present.     Mental Status: She is alert, oriented to person, place, and time and easily aroused. Mental status is at baseline.     Cranial Nerves: Cranial nerves 2-12 are intact. No cranial nerve deficit or facial asymmetry.     Motor: Motor function is intact.     Gait: Gait is intact.   Psychiatric:        Attention and Perception: Attention and perception normal.        Mood and Affect: Mood and affect normal.        Speech: Speech normal.         Behavior: Behavior normal. Behavior is cooperative.        Thought Content: Thought content normal.        Cognition and Memory: Cognition and memory normal.        Judgment: Judgment normal.       01/11/2024    1:53 PM 01/03/2024   10:48 AM 12/20/2023   10:16 AM  Depression screen PHQ 2/9  Decreased Interest 0 0 1  Down, Depressed, Hopeless 1 0 1  PHQ - 2 Score 1 0 2  Altered sleeping 1 1 2   Tired, decreased energy 0 0 1  Change in appetite 0 0 1  Feeling bad or failure about yourself  0 0 0  Trouble concentrating 0 0 0  Moving slowly or fidgety/restless 0 0 0  Suicidal thoughts 0 0 0  PHQ-9 Score 2 1 6   Difficult doing work/chores Somewhat difficult Somewhat difficult Not difficult at all      01/11/2024    1:54 PM 01/03/2024   10:54 AM 12/20/2023   10:17 AM 11/08/2023    8:24 AM  GAD 7 : Generalized Anxiety Score  Nervous, Anxious, on Edge 1 0 1 1  Control/stop worrying 0 0 1 0  Worry too much - different things 0 0 1 1  Trouble relaxing 1 0 0 0  Restless 0 0 0 1  Easily annoyed or irritable 0 1  0 0  Afraid - awful might happen 0 0 0 1  Total GAD 7 Score 2 1 3 4   Anxiety Difficulty Somewhat difficult Somewhat difficult Somewhat difficult Not difficult at all    Assessment/ Plan: Jade Singh here for annual physical exam.  1. Routine general medical examination at a health care facility (Primary) Discussed with patient to continue healthy lifestyle choices, including diet (rich in fruits, vegetables, and lean proteins, and low in salt and simple carbohydrates) and exercise (at least 30 minutes of moderate physical activity daily). Limit beverages high is sugar. Recommended at least 80-100 oz of water daily.   2. Primary hypertension Elevated BP in office. Patient verbalized that she has BP monitor at home. Recommend monitoring at home and reporting measurements in 2 weeks.   3. Major depressive disorder with single episode, in partial remission (HCC) Will start  medication as below as recommended. Discussed side effects with patient. Discussed not taking in conjunction with hydroxyzine  given side effects of drowsiness and age. Strongly recommended patient follow up with psychiatry. Denies SI. Safety contract established.  - mirtazapine (REMERON) 7.5 MG tablet; Take 1 tablet (7.5 mg total) by mouth at bedtime.  Dispense: 30 tablet; Refill: 1  4. Anxiety As above.  - mirtazapine (REMERON) 7.5 MG tablet; Take 1 tablet (7.5 mg total) by mouth at bedtime.  Dispense: 30 tablet; Refill: 1 - hydrOXYzine  (ATARAX ) 10 MG tablet; Take  1 to 3 tablets  at Bedtime  as needed for Sleep                                 /                                                                   TAKE                                         BY                                                 MOUTH  Dispense: 90 tablet; Refill: 0  5. Grief As above.  - mirtazapine (REMERON) 7.5 MG tablet; Take 1 tablet (7.5 mg total) by mouth at bedtime.  Dispense: 30 tablet; Refill: 1  6. Insomnia, unspecified type As above.  - mirtazapine (REMERON) 7.5 MG tablet; Take 1 tablet (7.5 mg total) by mouth at bedtime.  Dispense: 30 tablet; Refill: 1 - hydrOXYzine  (ATARAX ) 10 MG tablet; Take  1 to 3 tablets  at Bedtime  as needed for Sleep                                 /  TAKE                                         BY                                                 MOUTH  Dispense: 90 tablet; Refill: 0  7. Decreased GFR Labs as below. Will communicate results to patient once available. Will await results to determine next steps.  - CMP14+EGFR; Future - CMP14+EGFR  8. Skin cancer screening - Ambulatory referral to Dermatology  9. Need for zoster vaccination - Varicella-zoster vaccine IM   Patient to follow up in 1 year for annual exam or sooner if needed.  The above assessment and management plan was discussed with the patient.  The patient verbalized understanding of and has agreed to the management plan. Patient is aware to call the clinic if symptoms persist or worsen. Patient is aware when to return to the clinic for a follow-up visit. Patient educated on when it is appropriate to go to the emergency department.   Marry Kins, DNP-FNP [facility] [address] [place], [place] [address] [phone]

## 2024-01-12 ENCOUNTER — Ambulatory Visit: Payer: Self-pay | Admitting: Family Medicine

## 2024-01-12 ENCOUNTER — Telehealth: Payer: Self-pay

## 2024-01-12 ENCOUNTER — Other Ambulatory Visit: Payer: Self-pay | Admitting: Family Medicine

## 2024-01-12 ENCOUNTER — Other Ambulatory Visit: Payer: Self-pay | Admitting: Cardiovascular Disease

## 2024-01-12 DIAGNOSIS — Z1231 Encounter for screening mammogram for malignant neoplasm of breast: Secondary | ICD-10-CM

## 2024-01-12 DIAGNOSIS — E870 Hyperosmolality and hypernatremia: Secondary | ICD-10-CM

## 2024-01-12 DIAGNOSIS — F419 Anxiety disorder, unspecified: Secondary | ICD-10-CM

## 2024-01-12 DIAGNOSIS — G4700 Insomnia, unspecified: Secondary | ICD-10-CM

## 2024-01-12 DIAGNOSIS — F4321 Adjustment disorder with depressed mood: Secondary | ICD-10-CM

## 2024-01-12 DIAGNOSIS — F324 Major depressive disorder, single episode, in partial remission: Secondary | ICD-10-CM

## 2024-01-12 LAB — CMP14+EGFR
ALT: 21 IU/L (ref 0–32)
AST: 15 IU/L (ref 0–40)
Albumin: 4.1 g/dL (ref 3.8–4.8)
Alkaline Phosphatase: 66 IU/L (ref 44–121)
BUN/Creatinine Ratio: 18 (ref 12–28)
BUN: 24 mg/dL (ref 8–27)
Bilirubin Total: <0.2 mg/dL (ref 0.0–1.2)
CO2: 24 mmol/L (ref 20–29)
Calcium: 10 mg/dL (ref 8.7–10.3)
Chloride: 106 mmol/L (ref 96–106)
Creatinine, Ser: 1.33 mg/dL — ABNORMAL HIGH (ref 0.57–1.00)
Globulin, Total: 2 g/dL (ref 1.5–4.5)
Glucose: 92 mg/dL (ref 70–99)
Potassium: 4.4 mmol/L (ref 3.5–5.2)
Sodium: 147 mmol/L — ABNORMAL HIGH (ref 134–144)
Total Protein: 6.1 g/dL (ref 6.0–8.5)
eGFR: 43 mL/min/{1.73_m2} — ABNORMAL LOW (ref >59)

## 2024-01-12 MED ORDER — HYDROXYZINE HCL 10 MG PO TABS
ORAL_TABLET | ORAL | 0 refills | Status: AC
Start: 2024-01-12 — End: ?

## 2024-01-12 MED ORDER — MIRTAZAPINE 7.5 MG PO TABS: 7.5000 mg | ORAL_TABLET | Freq: Every day | ORAL | 1 refills | Status: DC

## 2024-01-12 NOTE — Telephone Encounter (Signed)
 Medications re sent to pharmacy.  Attempted to contact patient - NVM full

## 2024-01-12 NOTE — Telephone Encounter (Signed)
 Copied from CRM [id number]. Topic: Clinical - Prescription Issue >> Jan 11, 2024  4:56 PM Selinda RAMAN wrote: Reason for CRM: The patient called in stating [facility] has been having trouble with their fax so if someone could re fax the 2 prescriptions, mirtazapine (REMERON) 7.5 MG tablet and hydrOXYzine  (ATARAX ) 10 MG tablet. It does show the pharmacy confirmed receipt at 2:15 and 2:16 of both meds but she was told they never received them. Please re fax or send verbal as soon as possible

## 2024-01-12 NOTE — Addendum Note (Signed)
 Addended by: OLENA HARLENE BROCKS on: 01/12/2024 08:58 AM   Modules accepted: Orders

## 2024-01-12 NOTE — Progress Notes (Signed)
 GFR remains declined. Given steady decrease, this meets criteria for CKD. Will refer to Nephrology. Recommend patient reach out to cardiology as well to discuss alternatives for BP medications. Avoid NSAIDs, Maintain adequate hydration. Sodium is slightly elevated, recommend repeating in 4 weeks. Monitor for symptoms.

## 2024-01-14 ENCOUNTER — Other Ambulatory Visit: Payer: Self-pay | Admitting: Cardiovascular Disease

## 2024-01-17 ENCOUNTER — Ambulatory Visit: Admitting: Professional Counselor

## 2024-01-17 DIAGNOSIS — F324 Major depressive disorder, single episode, in partial remission: Secondary | ICD-10-CM

## 2024-01-17 NOTE — BH Specialist Note (Signed)
 [facility] Follow-[facility]  MRN: [id number] NAME: Jade Singh Date: 01/17/24  Start time: Start Time: 0930 End time: Stop Time: 1000 Total time: Total Time in Minutes (Visit): 30 Call number: Visit Number: 4- Fourth Visit  Reason for call today:  The patient is a 73 year old female who presented for a collaborative care follow-up. She reports an overall positive mood, with a PHQ-9 score of 2 and a GAD-7 score of 2, indicating minimal symptoms of depression and anxiety. She recently started a new medication and feels it has been effective, noting significant improvement in her mood and overall well-being.  The patient expressed that she no longer feels the need for continued therapy through collaborative care, sharing that she has come to a place of acceptance regarding her circumstances and feels stable on her current medication regimen. She expressed appreciation for the support provided by the collaborative care team.  Given her sustained improvement and low symptom scores, we will close her case in the collaborative care program at this time. She was provided with a referral to a traditional outpatient therapist for continued support, should she wish to engage in therapy in the future.   PHQ-9 Scores:     01/11/2024    1:53 PM 01/03/2024   10:48 AM 12/20/2023   10:16 AM 11/08/2023    8:24 AM 11/22/2022    2:40 PM  Depression screen PHQ 2/9  Decreased Interest 0 0 1 1 0  Down, Depressed, Hopeless 1 0 1 0 0  PHQ - 2 Score 1 0 2 1 0  Altered sleeping 1 1 2  0   Tired, decreased energy 0 0 1 1   Change in appetite 0 0 1 0   Feeling bad or failure about yourself  0 0 0 0   Trouble concentrating 0 0 0 0   Moving slowly or fidgety/restless 0 0 0 0   Suicidal thoughts 0 0 0 0   PHQ-9 Score 2 1 6 2    Difficult doing work/chores Somewhat difficult Somewhat difficult Not difficult at all Not difficult at all    GAD-7 Scores:     01/11/2024    1:54 PM 01/03/2024   10:54 AM 12/20/2023   10:17  AM 11/08/2023    8:24 AM  GAD 7 : Generalized Anxiety Score  Nervous, Anxious, on Edge 1 0 1 1  Control/stop worrying 0 0 1 0  Worry too much - different things 0 0 1 1  Trouble relaxing 1 0 0 0  Restless 0 0 0 1  Easily annoyed or irritable 0 1 0 0  Afraid - awful might happen 0 0 0 1  Total GAD 7 Score 2 1 3 4   Anxiety Difficulty Somewhat difficult Somewhat difficult Somewhat difficult Not difficult at all    Stress Current stressors:  Poor sleep Sleep:  Fair Appetite:  Good Coping ability:  Good Patient taking medications as prescribed:  Yes  Current medications:  Outpatient Encounter Medications as of 01/17/2024  Medication Sig   albuterol  (VENTOLIN  HFA) 108 (90 Base) MCG/ACT inhaler Inhale 2 puffs into the lungs every 6 (six) hours as needed for wheezing or shortness of breath.   aspirin  81 MG tablet Take 81 mg by mouth daily.   Budeson-Glycopyrrol-Formoterol (BREZTRI  AEROSPHERE) 160-9-4.8 MCG/ACT AERO Inhale 2 puffs into the lungs in the morning and at bedtime.   buPROPion  (WELLBUTRIN  XL) 150 MG 24 hr tablet TAKE ONE TABLET BY MOUTH EVERY MORNING   calcipotriene (DOVONOX) 0.005 % ointment Apply  1 application topically as needed (Hands).   Cholecalciferol  (VITAMIN D3) 5000 UNITS CAPS Take 5,000 Units by mouth 4 (four) times a week. Takes M,W,TH,F   ezetimibe  (ZETIA ) 10 MG tablet TAKE 1 TABLET BY MOUTH DAILY   hydrOXYzine  (ATARAX ) 10 MG tablet Take  1 to 3 tablets  at Bedtime  as needed for Sleep                                 /                                                                   TAKE                                         BY                                                 MOUTH   Magnesium 250 MG TABS Take 250 mg by mouth daily.   mirtazapine  (REMERON ) 7.5 MG tablet Take 1 tablet (7.5 mg total) by mouth at bedtime.   Multiple Vitamins-Minerals (MULTIVITAMIN WITH MINERALS) tablet Take 1 tablet by mouth daily.   nitroGLYCERIN  (NITROSTAT ) 0.4 MG SL tablet Place 1  tablet (0.4 mg total) under the tongue every 5 (five) minutes as needed for chest pain.   pantoprazole  (PROTONIX ) 40 MG tablet TAKE 1 TABLET DAILY TO PREVENT INDIGESTION & HEARTBURN   rosuvastatin  (CRESTOR ) 20 MG tablet TAJE 1 TABLET BY MOUTH SUN TUES THURS SAT FOR CHOLESTEROL   triamterene -hydrochlorothiazide  (MAXZIDE -25) 37.5-25 MG tablet TAKE 1 TABLET BY MOUTH DAILY. PATIENT MUST ATTEND UPCOMING APPOINTMENT FOR FUTURE REFILLS   No facility-administered encounter medications on file as of 01/17/2024.     Self-harm Behaviors Risk Assessment Self-harm risk factors:  Denies Patient endorses recent thoughts of harming self:  Denies   Danger to Others Risk Assessment Danger to others risk factors:  None Patient endorses recent thoughts of harming others:  Denies   Substance Use Assessment Patient recently consumed alcohol:  No  Alcohol Use Disorder Identification Test (AUDIT):     11/05/2023    7:36 PM 01/09/2024    5:05 PM  Alcohol Use Disorder Test (AUDIT)  1. How often do you have a drink containing alcohol? 1 1  2. How many drinks containing alcohol do you have on a typical day when you are drinking? 0 0  3. How often do you have six or more drinks on one occasion? 0 1  AUDIT-C Score 1  2      Patient-reported    Goals, Interventions and Follow-up Plan Goals: Increase healthy adjustment to current life circumstances Interventions: Behavioral Activation and CBT Cognitive Behavioral Therapy Follow-up Plan: Refer to [facility]patient Therapy   Jade Singh

## 2024-02-03 ENCOUNTER — Ambulatory Visit: Attending: Cardiovascular Disease | Admitting: Cardiovascular Disease

## 2024-02-03 ENCOUNTER — Encounter: Payer: Self-pay | Admitting: Cardiovascular Disease

## 2024-02-03 VITALS — BP 146/82 | HR 80 | Ht 65.0 in | Wt 226.8 lb

## 2024-02-03 DIAGNOSIS — I25118 Atherosclerotic heart disease of native coronary artery with other forms of angina pectoris: Secondary | ICD-10-CM | POA: Diagnosis not present

## 2024-02-03 DIAGNOSIS — F4321 Adjustment disorder with depressed mood: Secondary | ICD-10-CM

## 2024-02-03 DIAGNOSIS — E7800 Pure hypercholesterolemia, unspecified: Secondary | ICD-10-CM | POA: Diagnosis not present

## 2024-02-03 DIAGNOSIS — I10 Essential (primary) hypertension: Secondary | ICD-10-CM | POA: Diagnosis not present

## 2024-02-03 DIAGNOSIS — F419 Anxiety disorder, unspecified: Secondary | ICD-10-CM

## 2024-02-03 DIAGNOSIS — K219 Gastro-esophageal reflux disease without esophagitis: Secondary | ICD-10-CM | POA: Diagnosis not present

## 2024-02-03 MED ORDER — DIAZEPAM 10 MG PO TABS
10.0000 mg | ORAL_TABLET | Freq: Two times a day (BID) | ORAL | 0 refills | Status: DC | PRN
Start: 2024-02-03 — End: 2024-03-08

## 2024-02-03 MED ORDER — PANTOPRAZOLE SODIUM 40 MG PO TBEC: DELAYED_RELEASE_TABLET | ORAL | 3 refills | Status: DC

## 2024-02-03 NOTE — Patient Instructions (Addendum)
 Medication Instructions:  Valium  10 mg- 1 tablet up to 2 times daily as needed for anxiety *If you need a refill on your cardiac medications before your next appointment, please call your pharmacy*  Lab Work: None ordered If you have labs (blood work) drawn today and your tests are completely normal, you will receive your results only by: MyChart Message (if you have MyChart) OR A paper copy in the mail If you have any lab test that is abnormal or we need to change your treatment, we will call you to review the results.  Testing/Procedures: None ordered  Follow-Up: At [facility], you and your health needs are our priority.  As part of our continuing mission to provide you with exceptional heart care, our providers are all part of one team.  This team includes your primary Cardiologist (physician) and Advanced Practice Providers or APPs (Physician Assistants and Nurse Practitioners) who all work together to provide you with the care you need, when you need it.  Your next appointment:   1 year(s)  Provider:   Jerel Balding, MD    We recommend signing up for the patient portal called MyChart.  Sign up information is provided on this After Visit Summary.  MyChart is used to connect with patients for Virtual Visits (Telemedicine).  Patients are able to view lab/test results, encounter notes, upcoming appointments, etc.  Non-urgent messages can be sent to your provider as well.   To learn more about what you can do with MyChart, go to [email or web address].

## 2024-02-03 NOTE — Progress Notes (Signed)
 Cardiology Office Note    Date:  02/03/2024   ID:  Jade Singh, DOB [birth year], MRN [id number]  PCP:  Cathlene Marry Lenis, FNP (Inactive)  Cardiologist:   Jerel Balding, MD   No chief complaint on file.   History of Present Illness:  Jade Singh is a 73 y.o. female with moderate CAD, hypertension, hyperlipidemia, obesity.    She is very sad and openly crying today.  Her husband of 37 years passed away in the hospital after undergoing a Whipple procedure for pancreatic cancer.  She is having difficulty sleeping.  She has long had problems with anxiety and her PCP would occasionally give her a short prescription for diazepam , but she has been unable to get any, since her PCP has also recently passed away.  On the other hand she is doing very well from a cardiovascular point of view.  It has been well over a year since she is required any nitroglycerin  for chest pain.  Denies shortness of breath at rest or with activity, palpitations, dizziness, syncope, lower extremity edema, orthopnea, PND or focal neurological events.  She has not smoked cigarettes in almost 25 years, but it chest CT performed 04/06/2022 described upper lobe predominant emphysema.  Again noticed was heavy calcific atherosclerotic disease of the coronary arteries.  Her lipid profile is excellent with LDL 35, HDL 57, triglycerides 71, but continues to have hemoglobin A1c in prediabetes range at 5.8%.  Remains severely obese with a BMI of almost 38.  In October 2020 she underwent a coronary CT angiogram.  She has a very high calcium  score of 598 (96th percentile).  She had several moderate stenoses including 50% in the left main, 50-69% in the proximal LAD, 40-59% in the left circumflex.  However by fractional flow reserve the only significant stenosis was in the distal LAD (FFR 0.75).  Cardiac catheterization performed in October 2020 showed minimal luminal irregularities, no obstructive CAD.  Past  Medical History:  Diagnosis Date   ANAL FISSURE, HX OF 11/06/2007   Qualifier: Diagnosis of  By: Lewellyn CMA (AAMA), Amanda     Anxiety    Depression    Difficulty sleeping    takes Ambien    Diverticulosis 11/17/2003   DJD (degenerative joint disease)    Family history of anesthesia complication    mother quit breathing 30 yrs ago pt does not know any more details. 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   Unspecified vitamin D  deficiency    Urgency of urination     Past Surgical History:  Procedure Laterality Date   ABDOMINAL HYSTERECTOMY     partial   APPENDECTOMY     CARDIAC CATHETERIZATION  07/20/2003    no problems per pt    JOINT REPLACEMENT     KNEE ARTHROSCOPY     RT 2011, LT 2009   LEFT HEART CATH AND CORONARY ANGIOGRAPHY N/A 05/11/2019   Procedure: LEFT HEART CATH AND CORONARY ANGIOGRAPHY;  Surgeon: Darron Deatrice LABOR, MD;  Location: [facility];  Service: Cardiovascular;  Laterality: N/A;   TONSILLECTOMY AND ADENOIDECTOMY     TOTAL KNEE ARTHROPLASTY Right 02/28/2014   Procedure: RIGHT TOTAL KNEE ARTHROPLASTY;  Surgeon: Reyes JAYSON Billing, MD;  Location: [facility];  Service: Orthopedics;  Laterality: Right;   TUBAL LIGATION      Current Medications: Outpatient Medications Prior to Visit  Medication Sig Dispense Refill   albuterol  (VENTOLIN  HFA) 108 (90 Base) MCG/ACT inhaler Inhale 2 puffs into the lungs every 6 (six) hours as needed for wheezing or shortness of breath. 8 g 2   aspirin  81 MG tablet Take 81 mg by mouth daily.     Budeson-Glycopyrrol-Formoterol (BREZTRI  AEROSPHERE) 160-9-4.8 MCG/ACT AERO Inhale 2 puffs into the lungs in the morning and at bedtime. 10.7 g 2   buPROPion  (WELLBUTRIN  XL) 150 MG 24 hr tablet TAKE ONE TABLET BY MOUTH EVERY MORNING 90  tablet 3   calcipotriene (DOVONOX) 0.005 % ointment Apply 1 application topically as needed (Hands).     Cholecalciferol  (VITAMIN D3) 5000 UNITS CAPS Take 5,000 Units by mouth 4 (four) times a week. Takes M,W,TH,F     ezetimibe  (ZETIA ) 10 MG tablet TAKE 1 TABLET BY MOUTH DAILY 90 tablet 3   hydrOXYzine  (ATARAX ) 10 MG tablet Take  1 to 3 tablets  at Bedtime  as needed for Sleep                                 /                                                                   TAKE                                         BY                                                 MOUTH 90 tablet 0   Magnesium 250 MG TABS Take 250 mg by mouth daily.     mirtazapine  (REMERON ) 7.5 MG tablet Take 1 tablet (7.5 mg total) by mouth at bedtime. 30 tablet 1   Multiple Vitamins-Minerals (MULTIVITAMIN WITH MINERALS) tablet Take 1 tablet by mouth daily.     rosuvastatin  (CRESTOR ) 20 MG tablet TAJE 1 TABLET BY MOUTH SUN TUES THURS SAT FOR CHOLESTEROL 48 tablet 0   triamterene -hydrochlorothiazide  (MAXZIDE -25) 37.5-25 MG tablet TAKE 1 TABLET BY MOUTH DAILY. PATIENT MUST ATTEND UPCOMING APPOINTMENT FOR FUTURE REFILLS 90 tablet 0   pantoprazole  (PROTONIX ) 40 MG tablet TAKE 1 TABLET DAILY TO PREVENT INDIGESTION & HEARTBURN 90 tablet 3   nitroGLYCERIN  (NITROSTAT ) 0.4 MG SL tablet Place 1 tablet (0.4 mg total) under the tongue every 5 (five) minutes as needed for chest pain. (Patient not taking: Reported on 02/03/2024) 25 tablet 1   No facility-administered medications prior to visit.     Allergies:   Lexapro  [escitalopram  oxalate], Doxycycline, Levaquin [levofloxacin in d5w], and Oruvail [ketoprofen]   Family History:  The patient's family history includes Alcohol abuse in her father; Ataxia in her maternal aunt, maternal aunt, and mother; Cancer in her father and mother; Cancer (age of onset: 40) in her brother;  Diabetes in her paternal uncle; Heart attack in her maternal grandfather and mother; Heart disease in her paternal  grandfather; Hyperlipidemia in her mother; Stroke in her mother.    PHYSICAL EXAM:   VS:  BP (!) 146/82   Pulse 80   Ht 5' 5 (1.651 m)   Wt 226 lb 12.8 oz (102.9 kg)   SpO2 97%   BMI 37.74 kg/m      General: Alert, oriented x3, no distress, severely obese. Head: no evidence of trauma, PERRL, EOMI, no exophtalmos or lid lag, no myxedema, no xanthelasma; normal ears, nose and oropharynx Neck: normal jugular venous pulsations and no hepatojugular reflux; brisk carotid pulses without delay and no carotid bruits Chest: clear to auscultation, no signs of consolidation by percussion or palpation, normal fremitus, symmetrical and full respiratory excursions Cardiovascular: normal position and quality of the apical impulse, regular rhythm, normal first and second heart sounds, no murmurs, rubs or gallops Abdomen: no tenderness or distention, no masses by palpation, no abnormal pulsatility or arterial bruits, normal bowel sounds, no hepatosplenomegaly Extremities: no clubbing, cyanosis or edema; 2+ radial, ulnar and brachial pulses bilaterally; 2+ right femoral, posterior tibial and dorsalis pedis pulses; 2+ left femoral, posterior tibial and dorsalis pedis pulses; no subclavian or femoral bruits Neurological: grossly nonfocal Psych: Normal mood and affect     Wt Readings from Last 3 Encounters:  02/03/24 226 lb 12.8 oz (102.9 kg)  01/11/24 228 lb (103.4 kg)  11/08/23 231 lb (104.8 kg)    HYPERTENSION CONTROL Vitals:   02/03/24 1449 02/03/24 1524  BP: (!) 160/74 (!) 146/82    The patient's blood pressure is elevated above target today.  In order to address the patient's elevated BP: Blood pressure will be monitored at home to determine if medication changes need to be made.       Studies/Labs Reviewed:   EKG:    EKG Interpretation Date/Time:  Friday February 03 2024 14:53:18 EDT Ventricular Rate:  80 PR Interval:  152 QRS Duration:  76 QT Interval:  386 QTC Calculation: 445 R  Axis:   41  Text Interpretation: Sinus rhythm with Premature atrial complexes with Abberant conduction When compared with ECG of 11-May-2019 08:23, Abberant conduction is now Present Confirmed by Fareedah Mahler [phone]) on 02/03/2024 3:06:29 PM         Recent Labs: 02/22/2023: Magnesium 2.1; TSH 2.47 11/08/2023: Hemoglobin 14.1; Platelets 215 01/11/2024: ALT 21; BUN 24; Creatinine, Ser 1.33; Potassium 4.4; Sodium 147   Lipid Panel    Component Value Date/Time   CHOL 107 11/08/2023 0848   TRIG 71 11/08/2023 0848   HDL 57 11/08/2023 0848   CHOLHDL 1.9 11/08/2023 0848   CHOLHDL 2.0 02/22/2023 1428   VLDL 20 12/02/2016 1532   LDLCALC 35 11/08/2023 0848   LDLCALC 44 02/22/2023 1428      ASSESSMENT:    1. Coronary artery disease involving native coronary artery of native heart with other form of angina pectoris (HCC)   2. Essential hypertension   3. Hypercholesterolemia   4. Gastroesophageal reflux disease, unspecified whether esophagitis present   5. Anxiety   6. Grief       PLAN:  In order of problems listed above:  CAD: Despite the problems with recent extreme emotion she has not had any recent issues with angina pectoris and has not required nitroglycerin .  She has known moderate coronary atherosclerosis with some possible ischemia in the distal LAD territory due to diffuse disease.  No reason to change  medications at the current time.  The focus is on preventing disease progression. HTN: Elevated today but she is quite distraught talking about the loss of her husband.  No changes made to her medications. HLP: All lipid parameters are excellent.  Continue rosuvastatin  plus ezetimibe . GERD: She has had occasional esophageal reflux/esophageal spasm that may explain her resting chest pain symptoms as well. Obesity/prediabetes: Hemoglobin A1c 5.8%.  Ideally would encourage additional weight loss, but this is a difficult time for her psychologically after the loss of her  husband. Grief/anxiety: Explains her elevated blood pressure today.  She has been occasionally taking diazepam  over many years for anxiety from her PCP, but unfortunately he has recently passed away.  Gave her a short supply of diazepam  until she finds a new provider.  Understands that we will not provide any refills.     Medication Adjustments/Labs and Tests Ordered: Current medicines are reviewed at length with the patient today.  Concerns regarding medicines are outlined above.  Medication changes, Labs and Tests ordered today are listed in the Patient Instructions below. Patient Instructions  Medication Instructions:  Valium  10 mg- 1 tablet up to 2 times daily as needed for anxiety *If you need a refill on your cardiac medications before your next appointment, please call your pharmacy*  Lab Work: None ordered If you have labs (blood work) drawn today and your tests are completely normal, you will receive your results only by: MyChart Message (if you have MyChart) OR A paper copy in the mail If you have any lab test that is abnormal or we need to change your treatment, we will call you to review the results.  Testing/Procedures: None ordered  Follow-Up: At [facility], you and your health needs are our priority.  As part of our continuing mission to provide you with exceptional heart care, our providers are all part of one team.  This team includes your primary Cardiologist (physician) and Advanced Practice Providers or APPs (Physician Assistants and Nurse Practitioners) who all work together to provide you with the care you need, when you need it.  Your next appointment:   1 year(s)  Provider:   Jerel Balding, MD    We recommend signing up for the patient portal called MyChart.  Sign up information is provided on this After Visit Summary.  MyChart is used to connect with patients for Virtual Visits (Telemedicine).  Patients are able to view lab/test results,  encounter notes, upcoming appointments, etc.  Non-urgent messages can be sent to your provider as well.   To learn more about what you can do with MyChart, go to [email or web address].           Signed, Jerel Balding, MD  02/03/2024 3:27 PM    [facility] [address] [place], [place], [place]  [address] Phone: [phone]; Fax: [phone]

## 2024-02-09 ENCOUNTER — Ambulatory Visit
Admission: RE | Admit: 2024-02-09 | Discharge: 2024-02-09 | Disposition: A | Discharge status: Home / Self Care | Source: Ambulatory Visit | Attending: Family Medicine | Admitting: Family Medicine

## 2024-02-09 DIAGNOSIS — Z1231 Encounter for screening mammogram for malignant neoplasm of breast: Secondary | ICD-10-CM | POA: Diagnosis not present

## 2024-02-10 ENCOUNTER — Other Ambulatory Visit

## 2024-02-10 DIAGNOSIS — E870 Hyperosmolality and hypernatremia: Secondary | ICD-10-CM

## 2024-02-11 LAB — CMP14+EGFR
ALT: 19 IU/L (ref 0–32)
AST: 17 IU/L (ref 0–40)
Albumin: 3.9 g/dL (ref 3.8–4.8)
Alkaline Phosphatase: 63 IU/L (ref 44–121)
BUN/Creatinine Ratio: 26 (ref 12–28)
BUN: 25 mg/dL (ref 8–27)
Bilirubin Total: 0.3 mg/dL (ref 0.0–1.2)
CO2: 24 mmol/L (ref 20–29)
Calcium: 9.1 mg/dL (ref 8.7–10.3)
Chloride: 108 mmol/L — ABNORMAL HIGH (ref 96–106)
Creatinine, Ser: 0.96 mg/dL (ref 0.57–1.00)
Globulin, Total: 2.1 g/dL (ref 1.5–4.5)
Glucose: 105 mg/dL — ABNORMAL HIGH (ref 70–99)
Potassium: 4.6 mmol/L (ref 3.5–5.2)
Sodium: 146 mmol/L — ABNORMAL HIGH (ref 134–144)
Total Protein: 6 g/dL (ref 6.0–8.5)
eGFR: 63 mL/min/1.73 (ref >59)

## 2024-02-13 ENCOUNTER — Ambulatory Visit: Payer: Self-pay | Admitting: Family Medicine

## 2024-02-14 ENCOUNTER — Ambulatory Visit: Admitting: Professional Counselor

## 2024-02-14 DIAGNOSIS — F324 Major depressive disorder, single episode, in partial remission: Secondary | ICD-10-CM

## 2024-02-14 NOTE — BH Specialist Note (Unsigned)
 [facility]  MRN: [id number] NAME: JONNA DITTRICH Date: 02/14/24  Start time: Start Time: 0930 End time: Stop Time: 1000 Total time: Total Time in Minutes (Visit): 30 Call number: Visit Number: 5-Fifth Visit  Reason for call today:  The patient is a 73 year old female who presented for a collaborative care follow-up. She appeared in a depressed mood and reported a recent increase in grief-related symptoms, particularly over the past week. She shared that she has been missing her late husband intensely and feels as though the grief has surfaced more strongly at this time.  The patient has expressed insight into her emotional needs and recognizes that a longer-term therapeutic approach may be more appropriate moving forward. She is currently stable on her medication regimen.  Given her stability with medications and the nature of her symptoms, we discussed transitioning her care to a traditional grief therapist. The patient agreed with this plan. A referral to traditional therapy has been placed, and we will support her through the transition. Her primary care physician will continue to manage her medications.  PHQ-9 Scores:     01/11/2024    1:53 PM 01/03/2024   10:48 AM 12/20/2023   10:16 AM 11/08/2023    8:24 AM 11/22/2022    2:40 PM  Depression screen PHQ 2/9  Decreased Interest 0 0 1 1 0  Down, Depressed, Hopeless 1 0 1 0 0  PHQ - 2 Score 1 0 2 1 0  Altered sleeping 1 1 2  0   Tired, decreased energy 0 0 1 1   Change in appetite 0 0 1 0   Feeling bad or failure about yourself  0 0 0 0   Trouble concentrating 0 0 0 0   Moving slowly or fidgety/restless 0 0 0 0   Suicidal thoughts 0 0 0 0   PHQ-9 Score 2 1 6 2    Difficult doing work/chores Somewhat difficult Somewhat difficult Not difficult at all Not difficult at all    GAD-7 Scores:     01/11/2024    1:54 PM 01/03/2024   10:54 AM 12/20/2023   10:17 AM 11/08/2023    8:24 AM  GAD 7 : Generalized Anxiety Score  Nervous,  Anxious, on Edge 1 0 1 1  Control/stop worrying 0 0 1 0  Worry too much - different things 0 0 1 1  Trouble relaxing 1 0 0 0  Restless 0 0 0 1  Easily annoyed or irritable 0 1 0 0  Afraid - awful might happen 0 0 0 1  Total GAD 7 Score 2 1 3 4   Anxiety Difficulty Somewhat difficult Somewhat difficult Somewhat difficult Not difficult at all    Stress Current stressors:  Loneliness  Sleep:  poor Appetite:  Fair Coping ability:  Good Patient taking medications as prescribed:  Yes  Current medications:  Outpatient Encounter Medications as of 02/14/2024  Medication Sig   albuterol  (VENTOLIN  HFA) 108 (90 Base) MCG/ACT inhaler Inhale 2 puffs into the lungs every 6 (six) hours as needed for wheezing or shortness of breath.   aspirin  81 MG tablet Take 81 mg by mouth daily.   Budeson-Glycopyrrol-Formoterol (BREZTRI  AEROSPHERE) 160-9-4.8 MCG/ACT AERO Inhale 2 puffs into the lungs in the morning and at bedtime.   buPROPion  (WELLBUTRIN  XL) 150 MG 24 hr tablet TAKE ONE TABLET BY MOUTH EVERY MORNING   calcipotriene (DOVONOX) 0.005 % ointment Apply 1 application topically as needed (Hands).   Cholecalciferol  (VITAMIN D3) 5000 UNITS CAPS Take 5,000  Units by mouth 4 (four) times a week. Takes M,W,TH,F   diazepam  (VALIUM ) 10 MG tablet Take 1 tablet (10 mg total) by mouth every 12 (twelve) hours as needed for up to 20 doses for anxiety.   ezetimibe  (ZETIA ) 10 MG tablet TAKE 1 TABLET BY MOUTH DAILY   hydrOXYzine  (ATARAX ) 10 MG tablet Take  1 to 3 tablets  at Bedtime  as needed for Sleep                                 /                                                                   TAKE                                         BY                                                 MOUTH   Magnesium 250 MG TABS Take 250 mg by mouth daily.   mirtazapine  (REMERON ) 7.5 MG tablet Take 1 tablet (7.5 mg total) by mouth at bedtime.   Multiple Vitamins-Minerals (MULTIVITAMIN WITH MINERALS) tablet Take 1 tablet by  mouth daily.   nitroGLYCERIN  (NITROSTAT ) 0.4 MG SL tablet Place 1 tablet (0.4 mg total) under the tongue every 5 (five) minutes as needed for chest pain. (Patient not taking: Reported on 02/03/2024)   pantoprazole  (PROTONIX ) 40 MG tablet TAKE 1 TABLET DAILY TO PREVENT INDIGESTION & HEARTBURN   rosuvastatin  (CRESTOR ) 20 MG tablet TAJE 1 TABLET BY MOUTH SUN TUES THURS SAT FOR CHOLESTEROL   triamterene -hydrochlorothiazide  (MAXZIDE -25) 37.5-25 MG tablet TAKE 1 TABLET BY MOUTH DAILY. PATIENT MUST ATTEND UPCOMING APPOINTMENT FOR FUTURE REFILLS   No facility-administered encounter medications on file as of 02/14/2024.     Self-harm Behaviors Risk Assessment Self-harm risk factors:  Depression, grief Patient endorses recent thoughts of harming self:  Denies   Danger to Others Risk Assessment Danger to others risk factors:  none Patient endorses recent thoughts of harming others:  Denies   Substance Use Assessment Patient recently consumed alcohol:  Denies  Alcohol Use Disorder Identification Test (AUDIT):     11/05/2023    7:36 PM 01/09/2024    5:05 PM  Alcohol Use Disorder Test (AUDIT)  1. How often do you have a drink containing alcohol? 1 1  2. How many drinks containing alcohol do you have on a typical day when you are drinking? 0 0  3. How often do you have six or more drinks on one occasion? 0 1  AUDIT-C Score 1  2      Patient-reported      Goals, Interventions and Follow-up Plan Goals: Increase healthy adjustment to current life circumstances Interventions: CBT Cognitive Behavioral Therapy Follow-up Plan: Refer to traditional grief therapy for long-term support Continue medication management through PCP Assist with warm handoff and care transition Sign off from collaborative  care    [name]

## 2024-02-14 NOTE — Patient Instructions (Signed)
 Jade Singh, Andres Shad,?NCC, [facility]   Address: [address] [place], [place] [address]   Phone: [phone]   Services: Individual/couples therapy, mental wellness and behavioral coaching    Website: [employer] in [place], [place] -  [email or web address]

## 2024-02-21 ENCOUNTER — Ambulatory Visit (INDEPENDENT_AMBULATORY_CARE_PROVIDER_SITE_OTHER)

## 2024-02-21 VITALS — BP 146/82 | HR 80 | Ht 65.0 in | Wt 226.0 lb

## 2024-02-21 DIAGNOSIS — Z0000 Encounter for general adult medical examination without abnormal findings: Secondary | ICD-10-CM

## 2024-02-21 NOTE — Progress Notes (Signed)
 Subjective:   Jade Singh is a 73 y.o. who presents for a Medicare Wellness preventive visit.  As a reminder, Annual Wellness Visits don't include a physical exam, and some assessments may be limited, especially if this visit is performed virtually. We may recommend an in-person follow-up visit with your provider if needed.  Visit Complete: Virtual I connected with  Jade Singh [facility] on 02/21/24 by a audio enabled telemedicine application and verified that I am speaking with the correct person using two identifiers.  Patient Location: Home  Provider Location: Home Office  I discussed the limitations of evaluation and management by telemedicine. The patient expressed understanding and agreed to proceed.  Vital Signs: Because this visit was a virtual/telehealth visit, some criteria may be missing or patient reported. Any vitals not documented were not able to be obtained and vitals that have been documented are patient reported.  VideoDeclined- This patient declined Librarian, academic. Therefore the visit was completed with audio only.  Persons Participating in Visit: Patient.  AWV Questionnaire: Yes: Patient Medicare AWV questionnaire was completed by the patient on 02/17/24; I have confirmed that all information answered by patient is correct and no changes since this date.  Cardiac Risk Factors include: hypertension;advanced age (>65men, >53 women);obesity (BMI >30kg/m2)     Objective:    Today's Vitals   02/21/24 1548  BP: (!) 146/82  Pulse: 80  Weight: 226 lb (102.5 kg)  Height: 5' 5 (1.651 m)   Body mass index is 37.61 kg/m.     02/21/2024    3:32 PM 11/22/2022    2:40 PM 03/11/2022    2:51 PM 03/11/2022    2:50 PM 03/05/2021   11:06 AM 01/02/2020    3:06 PM 05/11/2019    7:29 AM  Advanced Directives  Does Patient Have a Medical Advance Directive? No No No No No No No  Does patient want to make changes to medical advance directive?   No  - Patient declined      Would patient like information on creating a medical advance directive?  No - Patient declined No - Patient declined  No - Patient declined No - Patient declined No - Patient declined    Current Medications (verified) Outpatient Encounter Medications as of 02/21/2024  Medication Sig   albuterol  (VENTOLIN  HFA) 108 (90 Base) MCG/ACT inhaler Inhale 2 puffs into the lungs every 6 (six) hours as needed for wheezing or shortness of breath.   aspirin  81 MG tablet Take 81 mg by mouth daily.   Budeson-Glycopyrrol-Formoterol (BREZTRI  AEROSPHERE) 160-9-4.8 MCG/ACT AERO Inhale 2 puffs into the lungs in the morning and at bedtime.   buPROPion  (WELLBUTRIN  XL) 150 MG 24 hr tablet TAKE ONE TABLET BY MOUTH EVERY MORNING   calcipotriene (DOVONOX) 0.005 % ointment Apply 1 application topically as needed (Hands).   Cholecalciferol  (VITAMIN D3) 5000 UNITS CAPS Take 5,000 Units by mouth 4 (four) times a week. Takes M,W,TH,F   diazepam  (VALIUM ) 10 MG tablet Take 1 tablet (10 mg total) by mouth every 12 (twelve) hours as needed for up to 20 doses for anxiety.   ezetimibe  (ZETIA ) 10 MG tablet TAKE 1 TABLET BY MOUTH DAILY   hydrOXYzine  (ATARAX ) 10 MG tablet Take  1 to 3 tablets  at Bedtime  as needed for Sleep                                 /  TAKE                                         BY                                                 MOUTH   Magnesium 250 MG TABS Take 250 mg by mouth daily.   mirtazapine  (REMERON ) 7.5 MG tablet Take 1 tablet (7.5 mg total) by mouth at bedtime.   Multiple Vitamins-Minerals (MULTIVITAMIN WITH MINERALS) tablet Take 1 tablet by mouth daily.   pantoprazole  (PROTONIX ) 40 MG tablet TAKE 1 TABLET DAILY TO PREVENT INDIGESTION & HEARTBURN   rosuvastatin  (CRESTOR ) 20 MG tablet TAJE 1 TABLET BY MOUTH SUN TUES THURS SAT FOR CHOLESTEROL   triamterene -hydrochlorothiazide  (MAXZIDE -25) 37.5-25 MG tablet TAKE 1  TABLET BY MOUTH DAILY. PATIENT MUST ATTEND UPCOMING APPOINTMENT FOR FUTURE REFILLS   nitroGLYCERIN  (NITROSTAT ) 0.4 MG SL tablet Place 1 tablet (0.4 mg total) under the tongue every 5 (five) minutes as needed for chest pain. (Patient not taking: Reported on 02/21/2024)   No facility-administered encounter medications on file as of 02/21/2024.    Allergies (verified) Lexapro  [escitalopram  oxalate], Doxycycline, Levaquin [levofloxacin in d5w], and Oruvail [ketoprofen]   History: Past Medical History:  Diagnosis Date   ANAL FISSURE, HX OF 11/06/2007   Qualifier: Diagnosis of  By: Earlean CMA (AAMA), Amanda     Anxiety    Depression    Difficulty sleeping    takes Ambien    Diverticulosis 11/17/2003   DJD (degenerative joint disease)    Family history of anesthesia complication    mother quit breathing 30 yrs ago pt does not know any more details. 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   Unspecified vitamin D  deficiency    Urgency of urination    Past Surgical History:  Procedure Laterality Date   ABDOMINAL HYSTERECTOMY     partial   APPENDECTOMY     CARDIAC CATHETERIZATION  07/20/2003    no problems per pt    JOINT REPLACEMENT     KNEE ARTHROSCOPY     RT 2011, LT 2009   LEFT HEART CATH AND CORONARY ANGIOGRAPHY N/A 05/11/2019   Procedure: LEFT HEART CATH AND CORONARY ANGIOGRAPHY;  Surgeon: Jade Jade LABOR, MD;  Location: [facility];  Service: Cardiovascular;  Laterality: N/A;   TONSILLECTOMY AND ADENOIDECTOMY     TOTAL KNEE ARTHROPLASTY Right 02/28/2014   Procedure: RIGHT TOTAL KNEE ARTHROPLASTY;  Surgeon: Jade Jade Billing, MD;  Location: [facility];  Service: Orthopedics;  Laterality: Right;   TUBAL LIGATION     Family History  Problem Relation Age of Onset   Stroke  Mother    Hyperlipidemia Mother    Heart attack Mother    Ataxia Mother        cerebellar ataxia 6   Cancer Mother    Alcohol abuse Father    Cancer Father    Cancer Brother 58  prostate, deceased   Heart attack Maternal Grandfather    Heart disease Paternal Grandfather    Diabetes Paternal Uncle    Ataxia Maternal Aunt    Ataxia Maternal Aunt    Colon cancer Neg Hx    Breast cancer Neg Hx    Social History   Socioeconomic History   Marital status: Married    Spouse name: Not on file   Number of children: Not on file   Years of education: Not on file   Highest education level: 12th grade  Occupational History   Not on file  Tobacco Use   Smoking status: Former    Current packs/day: 0.00    Average packs/day: 3.0 packs/day for 29.0 years (87.0 ttl pk-yrs)    Types: Cigarettes    Start date: 07/19/1969    Quit date: 07/19/1998    Years since quitting: 25.6   Smokeless tobacco: Never  Substance and Sexual Activity   Alcohol use: Yes    Alcohol/week: 1.0 standard drink of alcohol    Types: 1 Glasses of wine per week    Comment: occ   Drug use: No   Sexual activity: Not Currently    Birth control/protection: None  Other Topics Concern   Not on file  Social History Narrative   Not on file   Social Drivers of Health   Financial Resource Strain: Low Risk  (01/09/2024)   Overall Financial Resource Strain (CARDIA)    Difficulty of Paying Living Expenses: Not hard at all  Food Insecurity: No Food Insecurity (01/09/2024)   Hunger Vital Sign    Worried About Running Out of Food in the Last Year: Never true    Ran Out of Food in the Last Year: Never true  Transportation Needs: No Transportation Needs (01/09/2024)   PRAPARE - Administrator, Civil Service (Medical): No    Lack of Transportation (Non-Medical): No  Physical Activity: Inactive (01/09/2024)   Exercise Vital Sign    Days of Exercise per Week: 0 days    Minutes of Exercise per Session: Not on file   Stress: No Stress Concern Present (01/09/2024)   [employer] of Occupational Health - Occupational Stress Questionnaire    Feeling of Stress: Only a little  Recent Concern: Stress - Stress Concern Present (11/05/2023)   [employer] of Occupational Health - Occupational Stress Questionnaire    Feeling of Stress : Rather much  Social Connections: Socially Isolated (01/09/2024)   Social Connection and Isolation Panel    Frequency of Communication with Friends and Family: More than three times a week    Frequency of Social Gatherings with Friends and Family: Three times a week    Attends Religious Services: Never    Active Member of Clubs or Organizations: No    Attends Banker Meetings: Not on file    Marital Status: Widowed    Tobacco Counseling Counseling given: Yes    Clinical Intake:  Pre-visit preparation completed: Yes  Pain : No/denies pain     BMI - recorded: 37.61 Nutritional Status: BMI > 30  Obese Nutritional Risks: None Diabetes: No  Lab Results  Component Value Date   HGBA1C 5.8 (H) 11/08/2023   HGBA1C 6.0 (H) 02/22/2023   HGBA1C 5.9 (H) 08/16/2022     How often do you need to have someone help you when you read instructions, pamphlets, or other written materials from your doctor or pharmacy?: 1 - Never  Interpreter Needed?: No  Information entered by ::  alia t/cma   Activities of Daily Living     02/17/2024   11:29 AM  In your present state of health, do you have any difficulty performing the following activities:  Hearing? 0  Vision? 0  Difficulty concentrating or making decisions? 0  Walking or climbing stairs? 1  Dressing or bathing? 0  Doing errands, shopping? 0  Preparing Food and eating ? N  Using the Toilet? N  In the past six months, have you accidently leaked urine? Y  Do you have problems with loss of bowel control? N  Managing your Medications? N  Managing your Finances? N  Housekeeping or managing your  Housekeeping? N    Patient Care Team: Joesph Annabella HERO, FNP as PCP - General (Family Medicine) Francyne Headland, MD as Consulting Physician (Cardiology) Duwayne Purchase, MD as Consulting Physician (Orthopedic Surgery)  I have updated your Care Teams any recent Medical Services you may have received from other providers in the past year.     Assessment:   This is a routine wellness examination for [place].  Hearing/Vision screen Hearing Screening - Comments:: Pt denies hearing dif Vision Screening - Comments:: Pt wear glasses/contacts/pt foes to [facility] [address] In [place]/last ov in May 2024   Goals Addressed             This Visit's Progress    Exercise 150 min/wk Moderate Activity   On track      Depression Screen     02/21/2024    3:33 PM 02/14/2024    9:36 AM 01/11/2024    1:53 PM 01/03/2024   10:48 AM 12/20/2023   10:16 AM 11/08/2023    8:24 AM 11/22/2022    2:40 PM  PHQ 2/9 Scores  PHQ - 2 Score 0 4 1 0 2 1 0  PHQ- 9 Score 1 6 2 1 6 2      Fall Risk     02/21/2024    3:31 PM 02/17/2024   11:29 AM 01/11/2024    1:53 PM 11/08/2023    8:23 AM 11/22/2022    2:40 PM  Fall Risk   Falls in the past year? 0 0 0 0 0  Number falls in past yr: 0  0 0 0  Injury with Fall? 0  0 0 0  Risk for fall due to : No Fall Risks  No Fall Risks No Fall Risks No Fall Risks  Follow up Falls evaluation completed  Falls evaluation completed Falls evaluation completed Falls evaluation completed;Falls prevention discussed    MEDICARE RISK AT HOME:  Medicare Risk at Home Any stairs in or around the home?: (Patient-Rptd) Yes If so, are there any without handrails?: (Patient-Rptd) No Home free of loose throw rugs in walkways, pet beds, electrical cords, etc?: (Patient-Rptd) Yes Adequate lighting in your home to reduce risk of falls?: (Patient-Rptd) Yes Life alert?: (Patient-Rptd) No Use of a cane, walker or w/c?: (Patient-Rptd) No Grab bars in the bathroom?: (Patient-Rptd) Yes Shower chair or  bench in shower?: (Patient-Rptd) Yes Elevated toilet seat or a handicapped toilet?: (Patient-Rptd) Yes  TIMED UP AND GO:  Was the test performed?  no  Cognitive Function: 6CIT completed    01/02/2020    3:07 PM  MMSE - Mini Mental State Exam  Orientation to time 5  Orientation to Place 5  Registration 3  Attention/ Calculation 5  Recall 3  Language- name 2 objects 2  Language- repeat 1  Language- follow 3 step command 3  Language- read &  follow direction 1  Write a sentence 1  Copy design 1  Total score 30        02/21/2024    3:33 PM  6CIT Screen  What Year? 0 points  What month? 0 points  What time? 0 points  Count back from 20 0 points  Months in reverse 0 points  Repeat phrase 0 points  Total Score 0 points    Immunizations Immunization History  Administered Date(s) Administered   Fluad  Quad(high Dose 65+) 04/02/2019, 07/23/2022   Fluad  Trivalent(High Dose 65+) 06/27/2023   Influenza Inj Mdck Quad With Preservative 05/17/2017   Influenza Split 06/25/2014, 05/08/2015   Influenza, High Dose Seasonal PF 05/09/2018, 04/09/2020   Influenza,inj,quad, With Preservative 04/13/2016   Influenza-Unspecified 05/28/2013, 07/07/2021   Moderna Covid-19 Vaccine Bivalent Booster 61yrs & up 07/07/2021   Moderna Sars-Covid-2 Vaccination 08/24/2019, 09/22/2019, 05/13/2020   PPD Test 08/29/2013   Pfizer(Comirnaty )Fall Seasonal Vaccine 12 years and older 06/27/2023   Pneumococcal Conjugate-13 06/08/2017   Pneumococcal Polysaccharide-23 06/07/2013, 06/08/2018   Respiratory Syncytial Virus Vaccine ,Recomb Aduvanted(Arexvy ) 07/23/2022   Td 01/02/2020   Tdap 05/19/2009   Zoster Recombinant(Shingrix ) 01/11/2024   Zoster, Live 06/25/2014    Screening Tests Health Maintenance  Topic Date Due   Colonoscopy  11/17/2023   COVID-19 Vaccine (6 - Moderna risk 2024-25 season) 12/26/2023   INFLUENZA VACCINE  02/17/2024   MAMMOGRAM  02/08/2025   Medicare Annual Wellness (AWV)   02/20/2025   DTaP/Tdap/Td (3 - Td or Tdap) 01/01/2030   Pneumococcal Vaccine: 50+ Years  Completed   DEXA SCAN  Completed   Hepatitis C Screening  Completed   Hepatitis B Vaccines  Aged Out   HPV VACCINES  Aged Out   Meningococcal B Vaccine  Aged Out   Zoster Vaccines- Shingrix   Discontinued    Health Maintenance  Health Maintenance Due  Topic Date Due   Colonoscopy  11/17/2023   COVID-19 Vaccine (6 - Moderna risk 2024-25 season) 12/26/2023   INFLUENZA VACCINE  02/17/2024   Health Maintenance Items Addressed: See Nurse Notes at the end of this note  Additional Screening:  Vision Screening: Recommended annual ophthalmology exams for early detection of glaucoma and other disorders of the eye. Would you like a referral to an eye doctor? No    Dental Screening: Recommended annual dental exams for proper oral hygiene  Community Resource Referral / Chronic Care Management: CRR required this visit?  No   CCM required this visit?  No   Plan:    I have personally reviewed and noted the following in the patient's chart:   Medical and social history Use of alcohol, tobacco or illicit drugs  Current medications and supplements including opioid prescriptions. Patient is not currently taking opioid prescriptions. Functional ability and status Nutritional status Physical activity Advanced directives List of other physicians Hospitalizations, surgeries, and ER visits in previous 12 months Vitals Screenings to include cognitive, depression, and falls Referrals and appointments  In addition, I have reviewed and discussed with patient certain preventive protocols, quality metrics, and [facility] practice recommendations. A written personalized care plan for preventive services as well as general preventive health recommendations were provided to patient.   Ozie Ned, CMA   02/21/2024   After Visit Summary: (MyChart) Due to this being a telephonic visit, the after visit summary with  patients personalized plan was offered to patient via MyChart   Notes: Nothing significant to report at this time.

## 2024-02-21 NOTE — Patient Instructions (Signed)
 Ms. Dercole , Thank you for taking time out of your busy schedule to complete your Annual Wellness Visit with me. I enjoyed our conversation and look forward to speaking with you again next year. I, as well as your care team,  appreciate your ongoing commitment to your health goals. Please review the following plan we discussed and let me know if I can assist you in the future. Your Game plan/ To Do List    Referrals: If you haven't heard from the office you've been referred to, please reach out to them at the phone provided.   Follow up Visits: We will see or speak with you next year for your Next Medicare AWV with our clinical staff on 02/21/25 at 1:10p.m. Have you seen your provider in the last 6 months (3 months if uncontrolled diabetes)? Yes  Clinician Recommendations:  Aim for 30 minutes of exercise or brisk walking, 6-8 glasses of water, and 5 servings of fruits and vegetables each day.       This is a list of the screenings recommended for you:  Health Maintenance  Topic Date Due   Colon Cancer Screening  11/17/2023   Medicare Annual Wellness Visit  11/22/2023   COVID-19 Vaccine (6 - Moderna risk 2024-25 season) 12/26/2023   Flu Shot  02/17/2024   Mammogram  02/08/2025   DTaP/Tdap/Td vaccine (3 - Td or Tdap) 01/01/2030   Pneumococcal Vaccine for age over 59  Completed   DEXA scan (bone density measurement)  Completed   Hepatitis C Screening  Completed   Hepatitis B Vaccine  Aged Out   HPV Vaccine  Aged Out   Meningitis B Vaccine  Aged Out   Zoster (Shingles) Vaccine  Discontinued    Advanced directives: (Declined) Advance directive discussed with you today. Even though you declined this today, please call our office should you change your mind, and we can give you the proper paperwork for you to fill out. Advance Care Planning is important because it:  [x]  Makes sure you receive the medical care that is consistent with your values, goals, and preferences  [x]  It provides  guidance to your family and loved ones and reduces their decisional burden about whether or not they are making the right decisions based on your wishes.  Follow the link provided in your after visit summary or read over the paperwork we have mailed to you to help you started getting your Advance Directives in place. If you need assistance in completing these, please reach out to us  so that we can help you!  See attachments for Preventive Care and Fall Prevention Tips.

## 2024-03-05 ENCOUNTER — Other Ambulatory Visit: Payer: Self-pay | Admitting: *Deleted

## 2024-03-05 DIAGNOSIS — E782 Mixed hyperlipidemia: Secondary | ICD-10-CM

## 2024-03-05 MED ORDER — ROSUVASTATIN CALCIUM 20 MG PO TABS: ORAL_TABLET | ORAL | 0 refills | Status: DC

## 2024-03-08 ENCOUNTER — Encounter: Payer: Self-pay | Admitting: Family Medicine

## 2024-03-08 ENCOUNTER — Ambulatory Visit: Admitting: Family Medicine

## 2024-03-08 ENCOUNTER — Telehealth: Payer: Self-pay | Admitting: Family Medicine

## 2024-03-08 VITALS — BP 143/78 | HR 70 | Temp 98.1°F | Ht 65.0 in | Wt 227.0 lb

## 2024-03-08 DIAGNOSIS — E559 Vitamin D deficiency, unspecified: Secondary | ICD-10-CM

## 2024-03-08 DIAGNOSIS — F5104 Psychophysiologic insomnia: Secondary | ICD-10-CM

## 2024-03-08 DIAGNOSIS — E782 Mixed hyperlipidemia: Secondary | ICD-10-CM | POA: Diagnosis not present

## 2024-03-08 DIAGNOSIS — J439 Emphysema, unspecified: Secondary | ICD-10-CM | POA: Diagnosis not present

## 2024-03-08 DIAGNOSIS — K219 Gastro-esophageal reflux disease without esophagitis: Secondary | ICD-10-CM | POA: Diagnosis not present

## 2024-03-08 DIAGNOSIS — R7303 Prediabetes: Secondary | ICD-10-CM | POA: Diagnosis not present

## 2024-03-08 DIAGNOSIS — I25118 Atherosclerotic heart disease of native coronary artery with other forms of angina pectoris: Secondary | ICD-10-CM | POA: Diagnosis not present

## 2024-03-08 DIAGNOSIS — F339 Major depressive disorder, recurrent, unspecified: Secondary | ICD-10-CM

## 2024-03-08 DIAGNOSIS — F419 Anxiety disorder, unspecified: Secondary | ICD-10-CM

## 2024-03-08 DIAGNOSIS — I10 Essential (primary) hypertension: Secondary | ICD-10-CM | POA: Diagnosis not present

## 2024-03-08 DIAGNOSIS — Z1211 Encounter for screening for malignant neoplasm of colon: Secondary | ICD-10-CM

## 2024-03-08 LAB — BAYER DCA HB A1C WAIVED: HB A1C (BAYER DCA - WAIVED): 5.5 % (ref 4.8–5.6)

## 2024-03-08 NOTE — Telephone Encounter (Signed)
 Pt would like DEXA Appt for 09/25 since she has injection appt this day

## 2024-03-08 NOTE — Progress Notes (Signed)
 Established Patient Office Visit  Subjective   Patient ID: Jade Singh, female    DOB: 07-14-1951  Age: 73 y.o. MRN: [id number]  Chief Complaint  Patient presents with   Medical Management of Chronic Issues    HPI  History of Present Illness   Jade Singh is a 73 year old female with hypertension and coronary artery disease who presents for a routine follow-up.  Hypertension and cardiovascular symptoms - Hypertension managed with Maxzide   - home blood pressure readings typically in the 130s systolic - Blood pressure elevated during clinic visits, likely related to anxiety - Hx of coronary artery disease managed with daily aspirin  - Cholesterol levels within goal earlier this year, managed with Crestor , and Zetia   - Cardiology visit in July showed blood pressure of 138/70 mmHg - Occasional chest pain attributed to anxiety, especially after her husband's death - No significant dyspnea except when climbing stairs - No peripheral edema  Glycemic control and dietary habits - Prediabetes with most recent A1c of 5.8 - Diet frequently consists of convenience meals and sandwiches since living alone - Attempts to walk for exercise  Gastroesophageal reflux symptoms - Acid reflux managed with Protonix  - Heartburn occurs only if a dose is missed or with consumption of trigger foods - No dysphagia  Mood and sleep disturbances - Depression and anxiety managed with daily Wellbutrin  and hydroxyzine  at bedtime - Good response to current medication regimen  Emphysema - Noted on CT imaging - Mild obstructive disease on PFTs with pulmonology in 2024 - Uses breztri  prn - Denies wheezing, shortness of breath, cough          03/08/2024   10:39 AM 02/21/2024    3:33 PM 02/14/2024    9:36 AM  Depression screen PHQ 2/9  Decreased Interest 0 0 2  Down, Depressed, Hopeless 1 0 2  PHQ - 2 Score 1 0 4  Altered sleeping 1 1 2   Tired, decreased energy 0 0 0  Change in  appetite 0 0 0  Feeling bad or failure about yourself  0 0 0  Trouble concentrating 0 0 0  Moving slowly or fidgety/restless 0 0 0  Suicidal thoughts 0 0 0  PHQ-9 Score 2 1 6   Difficult doing work/chores Not difficult at all Not difficult at all Not difficult at all      03/08/2024   10:39 AM 02/14/2024    9:37 AM 01/11/2024    1:54 PM 01/03/2024   10:54 AM  GAD 7 : Generalized Anxiety Score  Nervous, Anxious, on Edge 1 2 1  0  Control/stop worrying 0 2 0 0  Worry too much - different things 0 2 0 0  Trouble relaxing 0 2 1 0  Restless 0 2 0 0  Easily annoyed or irritable 0 0 0 1  Afraid - awful might happen 0 0 0 0  Total GAD 7 Score 1 10 2 1   Anxiety Difficulty Not difficult at all Somewhat difficult Somewhat difficult Somewhat difficult       ROS Negative unless specially indicated above in HPI.    Objective:     BP (!) 143/78   Pulse 70   Temp 98.1 F (36.7 C) (Temporal)   Ht 5' 5 (1.651 m)   Wt 227 lb (103 kg)   SpO2 96%   BMI 37.77 kg/m  Wt Readings from Last 3 Encounters:  03/08/24 227 lb (103 kg)  02/21/24 226 lb (102.5 kg)  02/03/24 226 lb  12.8 oz (102.9 kg)      Physical Exam Vitals and nursing note reviewed.  Constitutional:      General: She is not in acute distress.    Appearance: She is obese. She is not ill-appearing, toxic-appearing or diaphoretic.  Cardiovascular:     Rate and Rhythm: Normal rate and regular rhythm.     Pulses: Normal pulses.     Heart sounds: Normal heart sounds. No murmur heard. Pulmonary:     Effort: Pulmonary effort is normal. No respiratory distress.     Breath sounds: Normal breath sounds.  Musculoskeletal:     Right lower leg: No edema.     Left lower leg: No edema.  Skin:    General: Skin is warm and dry.  Neurological:     General: No focal deficit present.     Mental Status: She is alert and oriented to person, place, and time.  Psychiatric:        Mood and Affect: Mood normal.        Behavior: Behavior  normal.        Thought Content: Thought content normal.        Judgment: Judgment normal.      No results found for any visits on 03/08/24.    The ASCVD Risk score (Arnett DK, et al., 2019) failed to calculate for the following reasons:   The valid total cholesterol range is 130 to 320 mg/dL    Assessment & Plan:   Jade Singh was seen today for medical management of chronic issues.  Diagnoses and all orders for this visit:  Primary hypertension  Mixed hyperlipidemia  Prediabetes -     Bayer DCA Hb A1c Waived  Morbid obesity (HCC)  Atherosclerotic heart disease of native coronary artery with other forms of angina pectoris (HCC)  Pulmonary emphysema, unspecified emphysema type (HCC)  Gastroesophageal reflux disease, unspecified whether esophagitis present  Vitamin D  deficiency  Depression, recurrent (HCC)  Anxiety  Chronic insomnia  Colon cancer screening -     Cologuard        Hypertension Well-controlled with home readings in the 130s systolic. Office readings higher due to white coat syndrome. Occasional chest pain attributed to anxiety. - Continue Maxzide  daily. - Encourage home blood pressure monitoring before appointments.  Coronary artery disease Recent cardiology evaluation satisfactory. - Continue daily aspirin  and statin therapy. - Monitor for new symptoms such as chest pain or shortness of breath.  Hyperlipidemia Well-managed with Crestor  and Zetia . Recent cholesterol levels satisfactory. - Continue Crestor  and Zetia .  Prediabetes A1c at 5.8%. Emphasized dietary modifications to prevent progression to type 2 diabetes. - Check A1c today. - Encourage heart-healthy, low-carb diet. - Schedule follow-up in 6 months.  Morbid obesity BMI 37 with HLD, HTN, prediabetes - Diet, exercise, weight loss   Emphysema Stable. Breztri  used as needed. - Use Breztri  as needed. - Monitor for symptoms such as wheezing or shortness of  breath. - Advise consistent use of Breztri  if symptoms develop.  Gastroesophageal reflux disease (GERD) Well-controlled with Protonix . Symptoms occur if Protonix  is missed or with trigger foods. - Continue Protonix . - Avoid known dietary triggers.  Depression/Anxiety/insomnia Well-managed with Wellbutrin  and hydroxyzine . Coping well despite recent stressors. - Continue Wellbutrin  and hydroxyzine .  General Health Maintenance Discussed timing for flu and shingles vaccinations. Bone density scan is due. - Administer flu shot in October or later. - Administer second shingles shot in September. - Schedule bone density scan   Follow-up Follow-up plan discussed for  ongoing management. - Schedule follow-up appointment in 6 months. - Ensure A1c is checked today. - Coordinate bone density scan with shingles shot appointment in September.      Return in about 6 months (around 09/08/2024) for chronic follow up.   Total time spent caring for the patient today was 41 minutes. This includes time spent before the visit reviewing the chart, time spent during the visit, and time spent after the visit on documentation.  The patient indicates understanding of these issues and agrees with the plan.  Annabella CHRISTELLA Search, FNP

## 2024-03-08 NOTE — Addendum Note (Signed)
 Addended by: JOESPH ANNABELLA HERO on: 03/08/2024 01:05 PM   Modules accepted: Level of Service

## 2024-03-09 ENCOUNTER — Ambulatory Visit: Payer: Self-pay | Admitting: Family Medicine

## 2024-03-12 NOTE — Telephone Encounter (Signed)
Called patient and made appt

## 2024-03-18 ENCOUNTER — Other Ambulatory Visit: Payer: Self-pay | Admitting: Cardiovascular Disease

## 2024-03-18 DIAGNOSIS — K219 Gastro-esophageal reflux disease without esophagitis: Secondary | ICD-10-CM

## 2024-03-28 LAB — COLOGUARD: COLOGUARD: NEGATIVE

## 2024-04-05 ENCOUNTER — Other Ambulatory Visit: Payer: Self-pay | Admitting: *Deleted

## 2024-04-05 DIAGNOSIS — F3341 Major depressive disorder, recurrent, in partial remission: Secondary | ICD-10-CM

## 2024-04-05 MED ORDER — BUPROPION HCL ER (XL) 150 MG PO TB24: 150.0000 mg | ORAL_TABLET | Freq: Every morning | ORAL | 1 refills | Status: AC

## 2024-04-12 ENCOUNTER — Ambulatory Visit (INDEPENDENT_AMBULATORY_CARE_PROVIDER_SITE_OTHER): Admitting: *Deleted

## 2024-04-12 ENCOUNTER — Ambulatory Visit (INDEPENDENT_AMBULATORY_CARE_PROVIDER_SITE_OTHER)

## 2024-04-12 ENCOUNTER — Other Ambulatory Visit: Payer: Self-pay | Admitting: Family Medicine

## 2024-04-12 DIAGNOSIS — Z780 Asymptomatic menopausal state: Secondary | ICD-10-CM | POA: Diagnosis not present

## 2024-04-12 DIAGNOSIS — Z13820 Encounter for screening for osteoporosis: Secondary | ICD-10-CM

## 2024-04-12 DIAGNOSIS — Z23 Encounter for immunization: Secondary | ICD-10-CM | POA: Diagnosis not present

## 2024-04-12 NOTE — Progress Notes (Signed)
 Patient is in office today for a nurse visit for 2ND SHINGLES SHOT. Injection was given in LEFT DELTOID. Patient tolerated well.   FLU SHOT was also given in RIGHT DELTOID. Patient tolerated well.

## 2024-04-13 ENCOUNTER — Other Ambulatory Visit: Payer: Self-pay | Admitting: Cardiovascular Disease

## 2024-04-13 DIAGNOSIS — Z780 Asymptomatic menopausal state: Secondary | ICD-10-CM | POA: Diagnosis not present

## 2024-04-18 ENCOUNTER — Ambulatory Visit: Payer: Self-pay | Admitting: Family Medicine

## 2024-05-08 DIAGNOSIS — L821 Other seborrheic keratosis: Secondary | ICD-10-CM | POA: Diagnosis not present

## 2024-05-08 DIAGNOSIS — L918 Other hypertrophic disorders of the skin: Secondary | ICD-10-CM | POA: Diagnosis not present

## 2024-05-08 DIAGNOSIS — L400 Psoriasis vulgaris: Secondary | ICD-10-CM | POA: Diagnosis not present

## 2024-05-08 DIAGNOSIS — D1801 Hemangioma of skin and subcutaneous tissue: Secondary | ICD-10-CM | POA: Diagnosis not present

## 2024-05-08 DIAGNOSIS — L814 Other melanin hyperpigmentation: Secondary | ICD-10-CM | POA: Diagnosis not present

## 2024-05-27 ENCOUNTER — Other Ambulatory Visit: Payer: Self-pay | Admitting: *Deleted

## 2024-05-27 DIAGNOSIS — E782 Mixed hyperlipidemia: Secondary | ICD-10-CM

## 2024-06-29 ENCOUNTER — Ambulatory Visit: Admitting: Pulmonary Disease

## 2024-06-29 ENCOUNTER — Telehealth: Payer: Self-pay

## 2024-06-29 ENCOUNTER — Encounter: Payer: Self-pay | Admitting: Pulmonary Disease

## 2024-06-29 DIAGNOSIS — I10 Essential (primary) hypertension: Secondary | ICD-10-CM

## 2024-06-29 DIAGNOSIS — J439 Emphysema, unspecified: Secondary | ICD-10-CM | POA: Diagnosis not present

## 2024-06-29 DIAGNOSIS — J432 Centrilobular emphysema: Secondary | ICD-10-CM

## 2024-06-29 DIAGNOSIS — Z87891 Personal history of nicotine dependence: Secondary | ICD-10-CM | POA: Diagnosis not present

## 2024-06-29 DIAGNOSIS — J449 Chronic obstructive pulmonary disease, unspecified: Secondary | ICD-10-CM | POA: Diagnosis not present

## 2024-06-29 MED ORDER — BREZTRI AEROSPHERE 160-9-4.8 MCG/ACT IN AERO: 2.0000 | INHALATION_SPRAY | Freq: Two times a day (BID) | RESPIRATORY_TRACT | 6 refills | Status: AC

## 2024-06-29 NOTE — Telephone Encounter (Signed)
 RN contacted patient about scheduling her overdue colonoscopy. Patient stated that she did a Cologuard test; results were negative.

## 2024-06-29 NOTE — Progress Notes (Signed)
 Jade Singh    [id number]    22-Jan-1951  Primary Care Physician:Morgan, Annabella HERO, FNP  Referring Physician: Joesph Annabella HERO, FNP [address] [place],  [place] [address]  Chief complaint:   Follow-up for emphysema  HPI:  Has been relatively stable  Compliant with Breztri   Tries to stay active  History of emphysema, CT scan shows extensive emphysema Quit smoking in 2000  History of hypertension-controlled  Denies any ongoing symptoms  She is able to get atorvastatin without significant shortness of breath   Outpatient Encounter Medications as of 07/23/2022  Medication Sig   albuterol  (VENTOLIN  HFA) 108 (90 Base) MCG/ACT inhaler Inhale 2 puffs into the lungs every 6 (six) hours as needed for wheezing or shortness of breath.   aspirin  81 MG tablet Take 81 mg by mouth daily.   Budeson-Glycopyrrol-Formoterol (BREZTRI  AEROSPHERE) 160-9-4.8 MCG/ACT AERO Inhale 2 puffs into the lungs in the morning and at bedtime.   buPROPion  (WELLBUTRIN  XL) 150 MG 24 hr tablet TAKE 1 TABLET BY MOUTH EVERY MORNING   calcipotriene (DOVONOX) 0.005 % ointment Apply 1 application topically as needed (Hands).   cetirizine (ZYRTEC) 10 MG tablet Take 10 mg by mouth daily.   Cholecalciferol  (VITAMIN D3) 5000 UNITS CAPS Take 5,000 Units by mouth 4 (four) times a week. Takes M,W,TH,F   ezetimibe  (ZETIA ) 10 MG tablet TAKE 1 TABLET BY MOUTH DAILY KEEP UPCOMING APPOINTMENT FOR FUTURE REFILLS   Magnesium 250 MG TABS Take 250 mg by mouth daily.   Multiple Vitamins-Minerals (MULTIVITAMIN WITH MINERALS) tablet Take 1 tablet by mouth daily.   pantoprazole  (PROTONIX ) 40 MG tablet TAKE 1 TABLET DAILY TO PREVENT INDIGESTION & HEARTBURN   rosuvastatin  (CRESTOR ) 20 MG tablet TAJE 1 TABLET BY MOUTH SUN TUES THURS SAT FOR CHOLESTEROL   triamterene -hydrochlorothiazide  (MAXZIDE -25) 37.5-25 MG tablet Take 1 tablet by mouth at bedtime. KEEP UPCOMING APPOINTMENT FOR FUTURE REFILLS.   zolpidem  (AMBIEN ) 5 MG  tablet TAKE ONE TABLET BY MOUTH EVERY EVENING ONE HOUR BEFORE BEDTIME   nitroGLYCERIN  (NITROSTAT ) 0.4 MG SL tablet Place 1 tablet (0.4 mg total) under the tongue every 5 (five) minutes as needed for chest pain.   No facility-administered encounter medications on file as of 07/23/2022.    Allergies as of 06/29/2024 - Review Complete 03/08/2024  Allergen Reaction Noted   Lexapro  [escitalopram  oxalate] Palpitations 10/29/2020   Doxycycline Other (See Comments) 07/06/2018   Levaquin [levofloxacin in d5w]  06/06/2013   Oruvail [ketoprofen] Nausea Only 06/06/2013    Past Medical History:  Diagnosis Date   ANAL FISSURE, HX OF 11/06/2007   Qualifier: Diagnosis of  By: Lewellyn CMA (AAMA), Amanda     Anxiety    Depression    Difficulty sleeping    takes Ambien    Diverticulosis 11/17/2003   DJD (degenerative joint disease)    Emphysema of lung (HCC)    Family history of anesthesia complication    mother quit breathing 30 yrs ago pt does not know any more details. 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   Unspecified vitamin D  deficiency    Urgency of urination     Past Surgical History:  Procedure Laterality Date   ABDOMINAL  HYSTERECTOMY     partial   APPENDECTOMY     CARDIAC CATHETERIZATION  07/20/2003    no problems per pt    JOINT REPLACEMENT     KNEE ARTHROSCOPY     RT 2011, LT 2009   LEFT HEART CATH AND CORONARY ANGIOGRAPHY N/A 05/11/2019   Procedure: LEFT HEART CATH AND CORONARY ANGIOGRAPHY;  Surgeon: Darron Deatrice LABOR, MD;  Location: [facility];  Service: Cardiovascular;  Laterality: N/A;   TONSILLECTOMY AND ADENOIDECTOMY     TOTAL KNEE ARTHROPLASTY Right 02/28/2014   Procedure: RIGHT TOTAL KNEE ARTHROPLASTY;  Surgeon: Reyes JAYSON Billing, MD;  Location: [facility];   Service: Orthopedics;  Laterality: Right;   TUBAL LIGATION      Family History  Problem Relation Age of Onset   Stroke Mother    Hyperlipidemia Mother    Heart attack Mother    Ataxia Mother        cerebellar ataxia 6   Cancer Mother    Alcohol abuse Father    Cancer Father    Cancer Brother 91       prostate, deceased   Heart attack Maternal Grandfather    Heart disease Paternal Grandfather    Diabetes Paternal Uncle    Ataxia Maternal Aunt    Ataxia Maternal Aunt    Colon cancer Neg Hx    Breast cancer Neg Hx     Social History   Socioeconomic History   Marital status: Married    Spouse name: Not on file   Number of children: Not on file   Years of education: Not on file   Highest education level: 12th grade  Occupational History   Not on file  Tobacco Use   Smoking status: Former    Current packs/day: 0.00    Average packs/day: 3.0 packs/day for 29.0 years (87.0 ttl pk-yrs)    Types: Cigarettes    Start date: 07/19/1969    Quit date: 07/19/1998    Years since quitting: 25.9   Smokeless tobacco: Never  Substance and Sexual Activity   Alcohol use: Yes    Alcohol/week: 1.0 standard drink of alcohol    Types: 1 Glasses of wine per week    Comment: occ   Drug use: No   Sexual activity: Not Currently    Birth control/protection: None  Other Topics Concern   Not on file  Social History Narrative   Not on file   Social Drivers of Health   Tobacco Use: Medium Risk (06/29/2024)   Patient History    Smoking Tobacco Use: Former    Smokeless Tobacco Use: Never    Passive Exposure: Not on file  Financial Resource Strain: Low Risk (01/09/2024)   Overall Financial Resource Strain (CARDIA)    Difficulty of Paying Living Expenses: Not hard at all  Food Insecurity: No Food Insecurity (01/09/2024)   Epic    Worried About Radiation Protection Practitioner of Food in the Last Year: Never true    Ran Out of Food in the Last Year: Never true  Transportation Needs: No Transportation Needs  (01/09/2024)   Epic    Lack of Transportation (Medical): No    Lack of Transportation (Non-Medical): No  Physical Activity: Inactive (01/09/2024)   Exercise Vital Sign    Days of Exercise per Week: 0 days    Minutes of Exercise per Session: Not on file  Stress: No Stress Concern Present (01/09/2024)   [employer] of Occupational Health - Occupational Stress Questionnaire  Feeling of Stress: Only a little  Recent Concern: Stress - Stress Concern Present (11/05/2023)   [employer] of Occupational Health - Occupational Stress Questionnaire    Feeling of Stress : Rather much  Social Connections: Socially Isolated (01/09/2024)   Social Connection and Isolation Panel    Frequency of Communication with Friends and Family: More than three times a week    Frequency of Social Gatherings with Friends and Family: Three times a week    Attends Religious Services: Never    Active Member of Clubs or Organizations: No    Attends Banker Meetings: Not on file    Marital Status: Widowed  Intimate Partner Violence: Not At Risk (11/08/2023)   Humiliation, Afraid, Rape, and Kick questionnaire    Fear of Current or Ex-Partner: No    Emotionally Abused: No    Physically Abused: No    Sexually Abused: No  Depression (PHQ2-9): Low Risk (03/08/2024)   Depression (PHQ2-9)    PHQ-2 Score: 2  Recent Concern: Depression (PHQ2-9) - Medium Risk (02/14/2024)   Depression (PHQ2-9)    PHQ-2 Score: 6  Alcohol Screen: Low Risk (01/09/2024)   Alcohol Screen    Last Alcohol Screening Score (AUDIT): 2  Housing: Unknown (01/09/2024)   Epic    Unable to Pay for Housing in the Last Year: No    Number of Times Moved in the Last Year: Not on file    Homeless in the Last Year: No  Utilities: Not At Risk (11/08/2023)   [facility] Utilities    Threatened with loss of utilities: No  Health Literacy: Adequate Health Literacy (02/21/2024)   B1300 Health Literacy    Frequency of need for help with medical  instructions: Never    Review of Systems  Constitutional:  Negative for fatigue.  Respiratory:  Negative for cough and shortness of breath.     Vitals:   06/29/24 1030  BP: (!) 146/79  Pulse: 84  SpO2: 95%     Physical Exam Constitutional:      Appearance: She is obese.  HENT:     Head: Normocephalic.     Mouth/Throat:     Mouth: Mucous membranes are moist.  Eyes:     General: No scleral icterus.    Pupils: Pupils are equal, round, and reactive to light.  Cardiovascular:     Rate and Rhythm: Normal rate and regular rhythm.     Heart sounds: No murmur heard.    No friction rub.  Pulmonary:     Effort: Pulmonary effort is normal. No respiratory distress.     Breath sounds: No stridor. No wheezing or rhonchi.  Musculoskeletal:     Cervical back: No rigidity or tenderness.  Neurological:     Mental Status: She is alert.  Psychiatric:        Mood and Affect: Mood normal.    Data Reviewed: CT scan of the chest showing emphysematous changes in the upper lobes  PFT with mild obstruction with significant bronchodilator response  Assessment:  Chronic obstructive pulmonary disease Emphysema - Controlled symptoms - Compliant with Breztri   No significant limitations with activity  History of hypertension Well-controlled   Plan/Recommendations: Continue Breztri   Graded activities as tolerated  Follow-up a year from now  Encouraged to give us  a call with any significant concerns  [facility] 06/29/2024, 10:37 AM  CC: Joesph Annabella HERO, FNP

## 2024-06-29 NOTE — Patient Instructions (Signed)
 I will see you back in a year from now  Continue Breztri   Regular exercises as tolerated  Call us  with significant concerns

## 2024-07-02 ENCOUNTER — Other Ambulatory Visit: Payer: Self-pay | Admitting: *Deleted

## 2024-07-02 DIAGNOSIS — F419 Anxiety disorder, unspecified: Secondary | ICD-10-CM

## 2024-07-02 DIAGNOSIS — G4700 Insomnia, unspecified: Secondary | ICD-10-CM

## 2024-07-02 MED ORDER — HYDROXYZINE HCL 10 MG PO TABS: ORAL_TABLET | ORAL | 0 refills | Status: DC

## 2024-07-30 ENCOUNTER — Ambulatory Visit (HOSPITAL_COMMUNITY): Admitting: Registered Nurse

## 2024-08-09 ENCOUNTER — Other Ambulatory Visit: Payer: Self-pay | Admitting: Family Medicine

## 2024-08-09 DIAGNOSIS — F419 Anxiety disorder, unspecified: Secondary | ICD-10-CM

## 2024-08-09 DIAGNOSIS — G4700 Insomnia, unspecified: Secondary | ICD-10-CM

## 2024-08-10 ENCOUNTER — Other Ambulatory Visit: Payer: Self-pay | Admitting: Cardiovascular Disease

## 2024-09-10 ENCOUNTER — Ambulatory Visit: Payer: Self-pay | Admitting: Family Medicine

## 2025-02-21 ENCOUNTER — Ambulatory Visit: Payer: Self-pay
